# Patient Record
Sex: Female | Born: 1935
Health system: Southern US, Community
[De-identification: ages and names within clinical notes are randomized; demographics above are authoritative.]

## PROBLEM LIST (undated history)

## (undated) ENCOUNTER — Emergency Department (HOSPITAL_COMMUNITY): Admission: EM | Payer: Medicare Other | Source: Home / Self Care

## (undated) DIAGNOSIS — I5189 Other ill-defined heart diseases: Secondary | ICD-10-CM

## (undated) DIAGNOSIS — M4800 Spinal stenosis, site unspecified: Secondary | ICD-10-CM

## (undated) DIAGNOSIS — E7800 Pure hypercholesterolemia, unspecified: Secondary | ICD-10-CM

## (undated) DIAGNOSIS — N182 Chronic kidney disease, stage 2 (mild): Secondary | ICD-10-CM

## (undated) DIAGNOSIS — R001 Bradycardia, unspecified: Secondary | ICD-10-CM

## (undated) DIAGNOSIS — D329 Benign neoplasm of meninges, unspecified: Secondary | ICD-10-CM

## (undated) DIAGNOSIS — H8110 Benign paroxysmal vertigo, unspecified ear: Secondary | ICD-10-CM

## (undated) DIAGNOSIS — M1990 Unspecified osteoarthritis, unspecified site: Secondary | ICD-10-CM

## (undated) DIAGNOSIS — K5790 Diverticulosis of intestine, part unspecified, without perforation or abscess without bleeding: Secondary | ICD-10-CM

## (undated) DIAGNOSIS — I2510 Atherosclerotic heart disease of native coronary artery without angina pectoris: Secondary | ICD-10-CM

## (undated) DIAGNOSIS — I5032 Chronic diastolic (congestive) heart failure: Secondary | ICD-10-CM

## (undated) DIAGNOSIS — J309 Allergic rhinitis, unspecified: Secondary | ICD-10-CM

## (undated) DIAGNOSIS — T50905A Adverse effect of unspecified drugs, medicaments and biological substances, initial encounter: Secondary | ICD-10-CM

## (undated) DIAGNOSIS — E871 Hypo-osmolality and hyponatremia: Secondary | ICD-10-CM

## (undated) DIAGNOSIS — Z9189 Other specified personal risk factors, not elsewhere classified: Secondary | ICD-10-CM

## (undated) DIAGNOSIS — I10 Essential (primary) hypertension: Secondary | ICD-10-CM

## (undated) HISTORY — DX: Allergic rhinitis, unspecified: J30.9

## (undated) HISTORY — DX: Chronic diastolic (congestive) heart failure: I50.32

## (undated) HISTORY — PX: BACK SURGERY: SHX140

## (undated) HISTORY — DX: Benign paroxysmal vertigo, unspecified ear: H81.10

## (undated) HISTORY — DX: Hypo-osmolality and hyponatremia: E87.1

## (undated) HISTORY — DX: Unspecified osteoarthritis, unspecified site: M19.90

## (undated) HISTORY — DX: Spinal stenosis, site unspecified: M48.00

## (undated) HISTORY — DX: Benign neoplasm of meninges, unspecified: D32.9

## (undated) HISTORY — DX: Chronic kidney disease, stage 2 (mild): N18.2

## (undated) HISTORY — DX: Diverticulosis of intestine, part unspecified, without perforation or abscess without bleeding: K57.90

## (undated) HISTORY — DX: Other specified personal risk factors, not elsewhere classified: Z91.89

## (undated) HISTORY — DX: Adverse effect of unspecified drugs, medicaments and biological substances, initial encounter: T50.905A

## (undated) HISTORY — PX: BREAST BIOPSY: SHX20

## (undated) HISTORY — DX: Bradycardia, unspecified: R00.1

## (undated) HISTORY — DX: Other ill-defined heart diseases: I51.89

## (undated) HISTORY — PX: TONSILLECTOMY: SUR1361

---

## 1997-08-14 ENCOUNTER — Emergency Department (HOSPITAL_COMMUNITY): Admission: EM | Admit: 1997-08-14 | Discharge: 1997-08-14 | Payer: Self-pay | Admitting: Emergency Medicine

## 1997-11-04 ENCOUNTER — Ambulatory Visit (HOSPITAL_BASED_OUTPATIENT_CLINIC_OR_DEPARTMENT_OTHER): Admission: RE | Admit: 1997-11-04 | Discharge: 1997-11-04 | Payer: Self-pay | Admitting: Otolaryngology

## 1998-02-22 ENCOUNTER — Ambulatory Visit (HOSPITAL_COMMUNITY): Admission: RE | Admit: 1998-02-22 | Discharge: 1998-02-22 | Payer: Self-pay | Admitting: *Deleted

## 1998-02-22 ENCOUNTER — Encounter: Payer: Self-pay | Admitting: *Deleted

## 1998-03-22 ENCOUNTER — Other Ambulatory Visit: Admission: RE | Admit: 1998-03-22 | Discharge: 1998-03-22 | Payer: Self-pay | Admitting: *Deleted

## 1998-10-19 ENCOUNTER — Ambulatory Visit: Admission: RE | Admit: 1998-10-19 | Discharge: 1998-10-19 | Payer: Self-pay | Admitting: *Deleted

## 1999-03-31 ENCOUNTER — Other Ambulatory Visit: Admission: RE | Admit: 1999-03-31 | Discharge: 1999-03-31 | Payer: Self-pay | Admitting: *Deleted

## 1999-05-27 ENCOUNTER — Ambulatory Visit (HOSPITAL_COMMUNITY): Admission: RE | Admit: 1999-05-27 | Discharge: 1999-05-27 | Payer: Self-pay | Admitting: Gastroenterology

## 1999-07-25 ENCOUNTER — Encounter: Admission: RE | Admit: 1999-07-25 | Discharge: 1999-07-25 | Payer: Self-pay | Admitting: Neurosurgery

## 1999-07-25 ENCOUNTER — Encounter: Payer: Self-pay | Admitting: *Deleted

## 2000-03-27 ENCOUNTER — Other Ambulatory Visit: Admission: RE | Admit: 2000-03-27 | Discharge: 2000-03-27 | Payer: Self-pay | Admitting: Internal Medicine

## 2000-03-29 ENCOUNTER — Encounter: Admission: RE | Admit: 2000-03-29 | Discharge: 2000-03-29 | Payer: Self-pay | Admitting: Internal Medicine

## 2000-03-29 ENCOUNTER — Encounter: Payer: Self-pay | Admitting: Internal Medicine

## 2000-07-25 ENCOUNTER — Encounter: Payer: Self-pay | Admitting: Internal Medicine

## 2000-07-25 ENCOUNTER — Encounter: Admission: RE | Admit: 2000-07-25 | Discharge: 2000-07-25 | Payer: Self-pay | Admitting: Internal Medicine

## 2000-12-04 ENCOUNTER — Ambulatory Visit (HOSPITAL_COMMUNITY): Admission: RE | Admit: 2000-12-04 | Discharge: 2000-12-04 | Payer: Self-pay | Admitting: Internal Medicine

## 2001-07-24 ENCOUNTER — Ambulatory Visit (HOSPITAL_COMMUNITY): Admission: RE | Admit: 2001-07-24 | Discharge: 2001-07-24 | Payer: Self-pay | Admitting: Internal Medicine

## 2001-07-29 ENCOUNTER — Encounter: Payer: Self-pay | Admitting: Internal Medicine

## 2001-07-29 ENCOUNTER — Encounter: Admission: RE | Admit: 2001-07-29 | Discharge: 2001-07-29 | Payer: Self-pay | Admitting: Internal Medicine

## 2002-07-31 ENCOUNTER — Encounter: Payer: Self-pay | Admitting: Internal Medicine

## 2002-07-31 ENCOUNTER — Encounter: Admission: RE | Admit: 2002-07-31 | Discharge: 2002-07-31 | Payer: Self-pay | Admitting: Internal Medicine

## 2003-04-30 ENCOUNTER — Other Ambulatory Visit: Admission: RE | Admit: 2003-04-30 | Discharge: 2003-04-30 | Payer: Self-pay | Admitting: Internal Medicine

## 2003-06-05 ENCOUNTER — Ambulatory Visit (HOSPITAL_COMMUNITY): Admission: RE | Admit: 2003-06-05 | Discharge: 2003-06-05 | Payer: Self-pay | Admitting: Gastroenterology

## 2003-08-06 ENCOUNTER — Encounter: Admission: RE | Admit: 2003-08-06 | Discharge: 2003-08-06 | Payer: Self-pay | Admitting: Internal Medicine

## 2004-08-09 ENCOUNTER — Encounter: Admission: RE | Admit: 2004-08-09 | Discharge: 2004-08-09 | Payer: Self-pay | Admitting: Internal Medicine

## 2004-09-13 ENCOUNTER — Encounter: Admission: RE | Admit: 2004-09-13 | Discharge: 2004-09-13 | Payer: Self-pay | Admitting: Internal Medicine

## 2005-08-11 ENCOUNTER — Encounter: Admission: RE | Admit: 2005-08-11 | Discharge: 2005-08-11 | Payer: Self-pay | Admitting: Internal Medicine

## 2006-07-05 ENCOUNTER — Encounter: Admission: RE | Admit: 2006-07-05 | Discharge: 2006-07-05 | Payer: Self-pay | Admitting: Internal Medicine

## 2006-08-14 ENCOUNTER — Encounter: Admission: RE | Admit: 2006-08-14 | Discharge: 2006-08-14 | Payer: Self-pay | Admitting: Internal Medicine

## 2007-08-16 ENCOUNTER — Encounter: Admission: RE | Admit: 2007-08-16 | Discharge: 2007-08-16 | Payer: Self-pay | Admitting: Internal Medicine

## 2008-08-18 ENCOUNTER — Encounter: Admission: RE | Admit: 2008-08-18 | Discharge: 2008-08-18 | Payer: Self-pay | Admitting: Internal Medicine

## 2009-08-23 ENCOUNTER — Encounter: Admission: RE | Admit: 2009-08-23 | Discharge: 2009-08-23 | Payer: Self-pay | Admitting: Internal Medicine

## 2010-08-04 ENCOUNTER — Other Ambulatory Visit: Payer: Self-pay | Admitting: Internal Medicine

## 2010-08-04 DIAGNOSIS — Z1231 Encounter for screening mammogram for malignant neoplasm of breast: Secondary | ICD-10-CM

## 2010-08-25 ENCOUNTER — Ambulatory Visit
Admission: RE | Admit: 2010-08-25 | Discharge: 2010-08-25 | Disposition: A | Discharge status: Home / Self Care | Payer: Medicare Other | Source: Ambulatory Visit | Attending: Internal Medicine | Admitting: Internal Medicine

## 2010-08-25 DIAGNOSIS — Z1231 Encounter for screening mammogram for malignant neoplasm of breast: Secondary | ICD-10-CM

## 2010-09-23 ENCOUNTER — Emergency Department (HOSPITAL_COMMUNITY)
Admission: EM | Admit: 2010-09-23 | Discharge: 2010-09-23 | Disposition: A | Discharge status: Home / Self Care | Payer: Medicare Other | Attending: Emergency Medicine | Admitting: Emergency Medicine

## 2010-09-23 DIAGNOSIS — I10 Essential (primary) hypertension: Secondary | ICD-10-CM | POA: Insufficient documentation

## 2010-09-23 DIAGNOSIS — R040 Epistaxis: Secondary | ICD-10-CM | POA: Insufficient documentation

## 2010-09-23 DIAGNOSIS — Z79899 Other long term (current) drug therapy: Secondary | ICD-10-CM | POA: Insufficient documentation

## 2010-09-23 DIAGNOSIS — E119 Type 2 diabetes mellitus without complications: Secondary | ICD-10-CM | POA: Insufficient documentation

## 2011-02-23 ENCOUNTER — Encounter: Payer: Self-pay | Admitting: Emergency Medicine

## 2011-02-23 ENCOUNTER — Emergency Department (INDEPENDENT_AMBULATORY_CARE_PROVIDER_SITE_OTHER): Payer: Medicare Other

## 2011-02-23 ENCOUNTER — Emergency Department (INDEPENDENT_AMBULATORY_CARE_PROVIDER_SITE_OTHER)
Admission: EM | Admit: 2011-02-23 | Discharge: 2011-02-23 | Disposition: A | Discharge status: Home / Self Care | Payer: Medicare Other | Source: Home / Self Care | Attending: Family Medicine | Admitting: Family Medicine

## 2011-02-23 DIAGNOSIS — K5792 Diverticulitis of intestine, part unspecified, without perforation or abscess without bleeding: Secondary | ICD-10-CM

## 2011-02-23 DIAGNOSIS — K5732 Diverticulitis of large intestine without perforation or abscess without bleeding: Secondary | ICD-10-CM

## 2011-02-23 HISTORY — DX: Atherosclerotic heart disease of native coronary artery without angina pectoris: I25.10

## 2011-02-23 HISTORY — DX: Essential (primary) hypertension: I10

## 2011-02-23 HISTORY — DX: Pure hypercholesterolemia, unspecified: E78.00

## 2011-02-23 LAB — CBC
Hemoglobin: 14.4 g/dL (ref 12.0–15.0)
MCV: 92.4 fL (ref 78.0–100.0)
Platelets: 234 10*3/uL (ref 150–400)
WBC: 7.2 10*3/uL (ref 4.0–10.5)

## 2011-02-23 LAB — DIFFERENTIAL
Basophils Absolute: 0 10*3/uL (ref 0.0–0.1)
Basophils Relative: 1 % (ref 0–1)
Eosinophils Relative: 4 % (ref 0–5)
Lymphs Abs: 2.7 10*3/uL (ref 0.7–4.0)
Monocytes Absolute: 0.4 10*3/uL (ref 0.1–1.0)

## 2011-02-23 MED ORDER — CIPROFLOXACIN HCL 500 MG PO TABS: 500.0000 mg | ORAL_TABLET | Freq: Two times a day (BID) | ORAL | Status: AC

## 2011-02-23 MED ORDER — METRONIDAZOLE 500 MG PO TABS: 500.0000 mg | ORAL_TABLET | Freq: Three times a day (TID) | ORAL | Status: AC

## 2011-02-23 NOTE — ED Notes (Signed)
As above.

## 2011-02-23 NOTE — ED Provider Notes (Signed)
History     CSN: [id number] Arrival date & time: 02/23/2011  8:49 AM   First MD Initiated Contact with Patient 02/23/11 [phone]      Chief Complaint  Patient presents with  . Abdominal Pain    c/o left side abdominal pain.  patient reports feeling "gas" since thanksgiving.  reports normal bowel movement yesterday--did take oral laxative the night before.  bm seemed to help pain, but last night pain reoccured and has been constant.  no nausea or vomiting, but belching that involved foamy liquid.  deneis urinary symptoms .  reports seeing her physician for the abdominal pain last week, recived cortisone injection.      (Consider location/radiation/quality/duration/timing/severity/associated sxs/prior treatment) HPI Comments: Brenda Lynch presents for evaluation of one week of LEFT lower abdominal pain. She denies any nausea, vomiting, or PO intolerance. She reports hx of diverticulosis and polyps and most recent colonoscopy last year. She denies any fever.  Patient is a 77 y.o. female presenting with abdominal pain. The history is provided by the patient.  Abdominal Pain The primary symptoms of the illness include abdominal pain. The primary symptoms of the illness do not include fever, nausea, vomiting, diarrhea or hematochezia. The onset of the illness was sudden. The problem has not changed since onset. The patient has not had a change in bowel habit. Risk factors for an acute abdominal problem include being elderly. Symptoms associated with the illness do not include chills, anorexia or constipation. Significant associated medical issues include diverticulitis.    Past Medical History  Diagnosis Date  . Hypercholesterolemia   . Hypertension   . Diabetes mellitus   . Coronary artery disease     Past Surgical History  Procedure Date  . Back surgery   . Tonsillectomy   . Breast biopsy     No family history on file.  History  Substance Use Topics  . Smoking status: Never Smoker   .  Smokeless tobacco: Not on file  . Alcohol Use: No    OB History    Grav Para Term Preterm Abortions TAB SAB Ect Mult Living                  Review of Systems  Constitutional: Negative for fever and chills.  HENT: Negative.   Eyes: Negative.   Respiratory: Negative.   Cardiovascular: Negative.   Gastrointestinal: Positive for abdominal pain. Negative for nausea, vomiting, diarrhea, constipation, blood in stool, hematochezia, rectal pain and anorexia.  Genitourinary: Negative.   Musculoskeletal: Negative.   Skin: Negative.   Neurological: Negative.     Allergies  Penicillins  Home Medications   Current Outpatient Rx  Name Route Sig Dispense Refill  . ASPIRIN 81 MG PO TABS Oral Take 81 mg by mouth daily.      . ATORVASTATIN CALCIUM PO Oral Take by mouth.      Marland Kitchen CALCIUM CARBONATE ANTACID 500 MG PO CHEW Oral Chew 1 tablet by mouth daily.      Marland Kitchen VITAMIN D3 3000 UNITS PO TABS Oral Take by mouth.      Marland Kitchen HYDROCHLOROTHIAZIDE PO Oral Take by mouth.      . METFORMIN HCL ER (MOD) PO Oral Take by mouth.      . METOPROLOL SUCCINATE ER 25 MG PO TB24 Oral Take 25 mg by mouth daily.      Marland Kitchen OMEPRAZOLE 10 MG PO CPDR Oral Take 10 mg by mouth daily.      Marland Kitchen POTASSIUM CHLORIDE CR PO Oral Take by  mouth.      Marland Kitchen RAMIPRIL PO Oral Take by mouth.      Marland Kitchen CIPROFLOXACIN HCL 500 MG PO TABS Oral Take 1 tablet (500 mg total) by mouth every 12 (twelve) hours. 20 tablet 0  . METRONIDAZOLE 500 MG PO TABS Oral Take 1 tablet (500 mg total) by mouth 3 (three) times daily. 30 tablet 0    BP 152/81  Pulse 59  Temp(Src) 97.9 F (36.6 C) (Oral)  Resp 18  SpO2 97%  Physical Exam  Nursing note and vitals reviewed. Constitutional: She is oriented to person, place, and time. She appears well-developed and well-nourished.  HENT:  Head: Normocephalic and atraumatic.  Eyes: EOM are normal.  Neck: Normal range of motion.  Pulmonary/Chest: Effort normal.  Abdominal: Soft. She exhibits no mass. Bowel sounds are  decreased. There is tenderness in the left upper quadrant and left lower quadrant. There is no rebound and no guarding.  Musculoskeletal: Normal range of motion.  Neurological: She is alert and oriented to person, place, and time.  Skin: Skin is warm and dry.  Psychiatric: Her behavior is normal.    ED Course  Procedures (including critical care time)   Labs Reviewed  CBC  DIFFERENTIAL   Dg Abd Acute W/chest  02/23/2011  *RADIOLOGY REPORT*  Clinical Data: Left-sided pain, history of diverticulosis  ACUTE ABDOMEN SERIES (ABDOMEN 2 VIEW & CHEST 1 VIEW)  Comparison: None.  Findings: The lungs are clear.  Mediastinal contours appear normal. The heart is within normal limits in size for age.  No acute bony abnormality is seen.  Supine and erect views of the abdomen show no bowel obstruction. No free air is seen.  No opaque calculi are noted.  There are degenerative changes in the mid to lower lumbar spine.  IMPRESSION:  1.  No active lung disease. 2.  No bowel obstruction.  No free air.  Original Report Authenticated By: Juline Patch, M.D.     1. Diverticulitis       MDM  Acute abdomen series: no acute findings or free air CBC reviewed; normal WBC        Richardo Priest, MD 02/23/11 1041

## 2011-03-23 DIAGNOSIS — M546 Pain in thoracic spine: Secondary | ICD-10-CM | POA: Diagnosis not present

## 2011-03-23 DIAGNOSIS — M545 Low back pain, unspecified: Secondary | ICD-10-CM | POA: Diagnosis not present

## 2011-03-28 DIAGNOSIS — M545 Low back pain, unspecified: Secondary | ICD-10-CM | POA: Diagnosis not present

## 2011-03-28 DIAGNOSIS — M546 Pain in thoracic spine: Secondary | ICD-10-CM | POA: Diagnosis not present

## 2011-04-04 DIAGNOSIS — M545 Low back pain, unspecified: Secondary | ICD-10-CM | POA: Diagnosis not present

## 2011-04-04 DIAGNOSIS — M546 Pain in thoracic spine: Secondary | ICD-10-CM | POA: Diagnosis not present

## 2011-05-31 DIAGNOSIS — E119 Type 2 diabetes mellitus without complications: Secondary | ICD-10-CM | POA: Diagnosis not present

## 2011-05-31 DIAGNOSIS — E782 Mixed hyperlipidemia: Secondary | ICD-10-CM | POA: Diagnosis not present

## 2011-05-31 DIAGNOSIS — N9489 Other specified conditions associated with female genital organs and menstrual cycle: Secondary | ICD-10-CM | POA: Diagnosis not present

## 2011-05-31 DIAGNOSIS — I10 Essential (primary) hypertension: Secondary | ICD-10-CM | POA: Diagnosis not present

## 2011-05-31 DIAGNOSIS — I519 Heart disease, unspecified: Secondary | ICD-10-CM | POA: Diagnosis not present

## 2011-05-31 DIAGNOSIS — M949 Disorder of cartilage, unspecified: Secondary | ICD-10-CM | POA: Diagnosis not present

## 2011-05-31 DIAGNOSIS — J309 Allergic rhinitis, unspecified: Secondary | ICD-10-CM | POA: Diagnosis not present

## 2011-05-31 DIAGNOSIS — R300 Dysuria: Secondary | ICD-10-CM | POA: Diagnosis not present

## 2011-05-31 DIAGNOSIS — M899 Disorder of bone, unspecified: Secondary | ICD-10-CM | POA: Diagnosis not present

## 2011-06-14 DIAGNOSIS — L989 Disorder of the skin and subcutaneous tissue, unspecified: Secondary | ICD-10-CM | POA: Diagnosis not present

## 2011-06-14 DIAGNOSIS — K649 Unspecified hemorrhoids: Secondary | ICD-10-CM | POA: Diagnosis not present

## 2011-07-13 DIAGNOSIS — N816 Rectocele: Secondary | ICD-10-CM | POA: Diagnosis not present

## 2011-07-13 DIAGNOSIS — D071 Carcinoma in situ of vulva: Secondary | ICD-10-CM | POA: Diagnosis not present

## 2011-07-13 DIAGNOSIS — N766 Ulceration of vulva: Secondary | ICD-10-CM | POA: Diagnosis not present

## 2011-07-19 ENCOUNTER — Other Ambulatory Visit: Payer: Self-pay | Admitting: Internal Medicine

## 2011-07-19 DIAGNOSIS — D071 Carcinoma in situ of vulva: Secondary | ICD-10-CM | POA: Diagnosis not present

## 2011-07-19 DIAGNOSIS — Z1231 Encounter for screening mammogram for malignant neoplasm of breast: Secondary | ICD-10-CM

## 2011-07-19 DIAGNOSIS — R109 Unspecified abdominal pain: Secondary | ICD-10-CM | POA: Diagnosis not present

## 2011-07-24 DIAGNOSIS — D071 Carcinoma in situ of vulva: Secondary | ICD-10-CM | POA: Diagnosis not present

## 2011-08-03 DIAGNOSIS — M899 Disorder of bone, unspecified: Secondary | ICD-10-CM | POA: Diagnosis not present

## 2011-08-03 DIAGNOSIS — M949 Disorder of cartilage, unspecified: Secondary | ICD-10-CM | POA: Diagnosis not present

## 2011-08-07 DIAGNOSIS — E559 Vitamin D deficiency, unspecified: Secondary | ICD-10-CM | POA: Diagnosis not present

## 2011-08-07 DIAGNOSIS — I10 Essential (primary) hypertension: Secondary | ICD-10-CM | POA: Diagnosis not present

## 2011-08-07 DIAGNOSIS — Z7982 Long term (current) use of aspirin: Secondary | ICD-10-CM | POA: Diagnosis not present

## 2011-08-07 DIAGNOSIS — E785 Hyperlipidemia, unspecified: Secondary | ICD-10-CM | POA: Diagnosis not present

## 2011-08-07 DIAGNOSIS — M899 Disorder of bone, unspecified: Secondary | ICD-10-CM | POA: Diagnosis not present

## 2011-08-07 DIAGNOSIS — M479 Spondylosis, unspecified: Secondary | ICD-10-CM | POA: Diagnosis not present

## 2011-08-07 DIAGNOSIS — Z808 Family history of malignant neoplasm of other organs or systems: Secondary | ICD-10-CM | POA: Diagnosis not present

## 2011-08-07 DIAGNOSIS — D071 Carcinoma in situ of vulva: Secondary | ICD-10-CM | POA: Diagnosis not present

## 2011-08-07 DIAGNOSIS — K5730 Diverticulosis of large intestine without perforation or abscess without bleeding: Secondary | ICD-10-CM | POA: Diagnosis not present

## 2011-08-29 ENCOUNTER — Ambulatory Visit
Admission: RE | Admit: 2011-08-29 | Discharge: 2011-08-29 | Disposition: A | Discharge status: Home / Self Care | Payer: Medicare Other | Source: Ambulatory Visit | Attending: Internal Medicine | Admitting: Internal Medicine

## 2011-08-29 DIAGNOSIS — Z1231 Encounter for screening mammogram for malignant neoplasm of breast: Secondary | ICD-10-CM

## 2011-09-07 DIAGNOSIS — D071 Carcinoma in situ of vulva: Secondary | ICD-10-CM | POA: Diagnosis not present

## 2011-09-13 DIAGNOSIS — K219 Gastro-esophageal reflux disease without esophagitis: Secondary | ICD-10-CM | POA: Diagnosis not present

## 2011-09-13 DIAGNOSIS — Z7982 Long term (current) use of aspirin: Secondary | ICD-10-CM | POA: Diagnosis not present

## 2011-09-13 DIAGNOSIS — M1990 Unspecified osteoarthritis, unspecified site: Secondary | ICD-10-CM | POA: Diagnosis not present

## 2011-09-13 DIAGNOSIS — N901 Moderate vulvar dysplasia: Secondary | ICD-10-CM | POA: Diagnosis not present

## 2011-09-13 DIAGNOSIS — I519 Heart disease, unspecified: Secondary | ICD-10-CM | POA: Diagnosis not present

## 2011-09-13 DIAGNOSIS — D071 Carcinoma in situ of vulva: Secondary | ICD-10-CM | POA: Diagnosis not present

## 2011-09-13 DIAGNOSIS — E785 Hyperlipidemia, unspecified: Secondary | ICD-10-CM | POA: Diagnosis not present

## 2011-09-13 DIAGNOSIS — N9089 Other specified noninflammatory disorders of vulva and perineum: Secondary | ICD-10-CM | POA: Diagnosis not present

## 2011-09-13 DIAGNOSIS — E119 Type 2 diabetes mellitus without complications: Secondary | ICD-10-CM | POA: Diagnosis not present

## 2011-09-13 DIAGNOSIS — I10 Essential (primary) hypertension: Secondary | ICD-10-CM | POA: Diagnosis not present

## 2011-09-13 DIAGNOSIS — E559 Vitamin D deficiency, unspecified: Secondary | ICD-10-CM | POA: Diagnosis not present

## 2011-09-27 DIAGNOSIS — N9489 Other specified conditions associated with female genital organs and menstrual cycle: Secondary | ICD-10-CM | POA: Diagnosis not present

## 2011-10-09 DIAGNOSIS — N949 Unspecified condition associated with female genital organs and menstrual cycle: Secondary | ICD-10-CM | POA: Diagnosis not present

## 2011-10-11 DIAGNOSIS — Z0000 Encounter for general adult medical examination without abnormal findings: Secondary | ICD-10-CM | POA: Diagnosis not present

## 2011-10-11 DIAGNOSIS — E1349 Other specified diabetes mellitus with other diabetic neurological complication: Secondary | ICD-10-CM | POA: Diagnosis not present

## 2011-10-11 DIAGNOSIS — D509 Iron deficiency anemia, unspecified: Secondary | ICD-10-CM | POA: Diagnosis not present

## 2011-10-11 DIAGNOSIS — M949 Disorder of cartilage, unspecified: Secondary | ICD-10-CM | POA: Diagnosis not present

## 2011-10-11 DIAGNOSIS — Z1331 Encounter for screening for depression: Secondary | ICD-10-CM | POA: Diagnosis not present

## 2011-10-11 DIAGNOSIS — E1149 Type 2 diabetes mellitus with other diabetic neurological complication: Secondary | ICD-10-CM | POA: Diagnosis not present

## 2011-10-11 DIAGNOSIS — I10 Essential (primary) hypertension: Secondary | ICD-10-CM | POA: Diagnosis not present

## 2011-10-11 DIAGNOSIS — E782 Mixed hyperlipidemia: Secondary | ICD-10-CM | POA: Diagnosis not present

## 2011-10-11 DIAGNOSIS — M899 Disorder of bone, unspecified: Secondary | ICD-10-CM | POA: Diagnosis not present

## 2011-11-13 DIAGNOSIS — H26019 Infantile and juvenile cortical, lamellar, or zonular cataract, unspecified eye: Secondary | ICD-10-CM | POA: Diagnosis not present

## 2011-11-13 DIAGNOSIS — E119 Type 2 diabetes mellitus without complications: Secondary | ICD-10-CM | POA: Diagnosis not present

## 2011-11-13 DIAGNOSIS — H2510 Age-related nuclear cataract, unspecified eye: Secondary | ICD-10-CM | POA: Diagnosis not present

## 2011-12-04 DIAGNOSIS — H26019 Infantile and juvenile cortical, lamellar, or zonular cataract, unspecified eye: Secondary | ICD-10-CM | POA: Diagnosis not present

## 2011-12-04 DIAGNOSIS — H2510 Age-related nuclear cataract, unspecified eye: Secondary | ICD-10-CM | POA: Diagnosis not present

## 2011-12-20 DIAGNOSIS — H2510 Age-related nuclear cataract, unspecified eye: Secondary | ICD-10-CM | POA: Diagnosis not present

## 2011-12-27 DIAGNOSIS — H2510 Age-related nuclear cataract, unspecified eye: Secondary | ICD-10-CM | POA: Diagnosis not present

## 2011-12-27 DIAGNOSIS — H268 Other specified cataract: Secondary | ICD-10-CM | POA: Diagnosis not present

## 2011-12-30 DIAGNOSIS — Z23 Encounter for immunization: Secondary | ICD-10-CM | POA: Diagnosis not present

## 2012-01-24 DIAGNOSIS — D485 Neoplasm of uncertain behavior of skin: Secondary | ICD-10-CM | POA: Diagnosis not present

## 2012-01-30 DIAGNOSIS — I519 Heart disease, unspecified: Secondary | ICD-10-CM | POA: Diagnosis not present

## 2012-01-30 DIAGNOSIS — I10 Essential (primary) hypertension: Secondary | ICD-10-CM | POA: Diagnosis not present

## 2012-04-11 DIAGNOSIS — E782 Mixed hyperlipidemia: Secondary | ICD-10-CM | POA: Diagnosis not present

## 2012-04-11 DIAGNOSIS — K219 Gastro-esophageal reflux disease without esophagitis: Secondary | ICD-10-CM | POA: Diagnosis not present

## 2012-04-11 DIAGNOSIS — E1149 Type 2 diabetes mellitus with other diabetic neurological complication: Secondary | ICD-10-CM | POA: Diagnosis not present

## 2012-04-11 DIAGNOSIS — I10 Essential (primary) hypertension: Secondary | ICD-10-CM | POA: Diagnosis not present

## 2012-04-11 DIAGNOSIS — J309 Allergic rhinitis, unspecified: Secondary | ICD-10-CM | POA: Diagnosis not present

## 2012-04-11 DIAGNOSIS — E1349 Other specified diabetes mellitus with other diabetic neurological complication: Secondary | ICD-10-CM | POA: Diagnosis not present

## 2012-04-22 DIAGNOSIS — D071 Carcinoma in situ of vulva: Secondary | ICD-10-CM | POA: Diagnosis not present

## 2012-04-22 DIAGNOSIS — N952 Postmenopausal atrophic vaginitis: Secondary | ICD-10-CM | POA: Diagnosis not present

## 2012-04-22 DIAGNOSIS — Z961 Presence of intraocular lens: Secondary | ICD-10-CM | POA: Diagnosis not present

## 2012-04-22 DIAGNOSIS — H04129 Dry eye syndrome of unspecified lacrimal gland: Secondary | ICD-10-CM | POA: Diagnosis not present

## 2012-04-29 DIAGNOSIS — I519 Heart disease, unspecified: Secondary | ICD-10-CM | POA: Diagnosis not present

## 2012-04-29 DIAGNOSIS — R0602 Shortness of breath: Secondary | ICD-10-CM | POA: Diagnosis not present

## 2012-04-29 DIAGNOSIS — I10 Essential (primary) hypertension: Secondary | ICD-10-CM | POA: Diagnosis not present

## 2012-05-06 DIAGNOSIS — R51 Headache: Secondary | ICD-10-CM | POA: Diagnosis not present

## 2012-05-06 DIAGNOSIS — M2669 Other specified disorders of temporomandibular joint: Secondary | ICD-10-CM | POA: Diagnosis not present

## 2012-05-07 DIAGNOSIS — I519 Heart disease, unspecified: Secondary | ICD-10-CM | POA: Diagnosis not present

## 2012-05-07 DIAGNOSIS — R0602 Shortness of breath: Secondary | ICD-10-CM | POA: Diagnosis not present

## 2012-05-07 DIAGNOSIS — I10 Essential (primary) hypertension: Secondary | ICD-10-CM | POA: Diagnosis not present

## 2012-05-28 DIAGNOSIS — I519 Heart disease, unspecified: Secondary | ICD-10-CM | POA: Diagnosis not present

## 2012-05-28 DIAGNOSIS — I10 Essential (primary) hypertension: Secondary | ICD-10-CM | POA: Diagnosis not present

## 2012-07-25 ENCOUNTER — Other Ambulatory Visit: Payer: Self-pay

## 2012-07-25 DIAGNOSIS — Z1231 Encounter for screening mammogram for malignant neoplasm of breast: Secondary | ICD-10-CM

## 2012-08-29 ENCOUNTER — Ambulatory Visit
Admission: RE | Admit: 2012-08-29 | Discharge: 2012-08-29 | Disposition: A | Discharge status: Home / Self Care | Payer: Medicare Other | Source: Ambulatory Visit

## 2012-08-29 DIAGNOSIS — Z1231 Encounter for screening mammogram for malignant neoplasm of breast: Secondary | ICD-10-CM

## 2012-09-09 DIAGNOSIS — T7840XA Allergy, unspecified, initial encounter: Secondary | ICD-10-CM | POA: Diagnosis not present

## 2012-09-17 DIAGNOSIS — H8110 Benign paroxysmal vertigo, unspecified ear: Secondary | ICD-10-CM | POA: Diagnosis not present

## 2012-09-17 DIAGNOSIS — R0982 Postnasal drip: Secondary | ICD-10-CM | POA: Diagnosis not present

## 2012-09-17 DIAGNOSIS — J3489 Other specified disorders of nose and nasal sinuses: Secondary | ICD-10-CM | POA: Diagnosis not present

## 2012-10-10 DIAGNOSIS — K219 Gastro-esophageal reflux disease without esophagitis: Secondary | ICD-10-CM | POA: Diagnosis not present

## 2012-10-10 DIAGNOSIS — E1149 Type 2 diabetes mellitus with other diabetic neurological complication: Secondary | ICD-10-CM | POA: Diagnosis not present

## 2012-10-10 DIAGNOSIS — E1349 Other specified diabetes mellitus with other diabetic neurological complication: Secondary | ICD-10-CM | POA: Diagnosis not present

## 2012-10-10 DIAGNOSIS — Z0000 Encounter for general adult medical examination without abnormal findings: Secondary | ICD-10-CM | POA: Diagnosis not present

## 2012-10-10 DIAGNOSIS — E782 Mixed hyperlipidemia: Secondary | ICD-10-CM | POA: Diagnosis not present

## 2012-10-10 DIAGNOSIS — M25519 Pain in unspecified shoulder: Secondary | ICD-10-CM | POA: Diagnosis not present

## 2012-10-10 DIAGNOSIS — I10 Essential (primary) hypertension: Secondary | ICD-10-CM | POA: Diagnosis not present

## 2012-10-10 DIAGNOSIS — Z1331 Encounter for screening for depression: Secondary | ICD-10-CM | POA: Diagnosis not present

## 2012-10-14 DIAGNOSIS — M542 Cervicalgia: Secondary | ICD-10-CM | POA: Diagnosis not present

## 2012-10-14 DIAGNOSIS — M25819 Other specified joint disorders, unspecified shoulder: Secondary | ICD-10-CM | POA: Diagnosis not present

## 2012-11-11 DIAGNOSIS — E119 Type 2 diabetes mellitus without complications: Secondary | ICD-10-CM | POA: Diagnosis not present

## 2012-11-11 DIAGNOSIS — Z961 Presence of intraocular lens: Secondary | ICD-10-CM | POA: Diagnosis not present

## 2012-11-25 ENCOUNTER — Other Ambulatory Visit: Payer: Self-pay | Admitting: Obstetrics and Gynecology

## 2012-11-25 DIAGNOSIS — Z87412 Personal history of vulvar dysplasia: Secondary | ICD-10-CM | POA: Diagnosis not present

## 2012-12-06 DIAGNOSIS — H02839 Dermatochalasis of unspecified eye, unspecified eyelid: Secondary | ICD-10-CM | POA: Diagnosis not present

## 2012-12-12 DIAGNOSIS — Z23 Encounter for immunization: Secondary | ICD-10-CM | POA: Diagnosis not present

## 2012-12-17 DIAGNOSIS — R51 Headache: Secondary | ICD-10-CM | POA: Diagnosis not present

## 2012-12-17 DIAGNOSIS — J309 Allergic rhinitis, unspecified: Secondary | ICD-10-CM | POA: Diagnosis not present

## 2012-12-24 DIAGNOSIS — J309 Allergic rhinitis, unspecified: Secondary | ICD-10-CM | POA: Diagnosis not present

## 2012-12-24 DIAGNOSIS — H1045 Other chronic allergic conjunctivitis: Secondary | ICD-10-CM | POA: Diagnosis not present

## 2012-12-26 ENCOUNTER — Other Ambulatory Visit: Payer: Self-pay | Admitting: Obstetrics and Gynecology

## 2012-12-26 ENCOUNTER — Other Ambulatory Visit (HOSPITAL_COMMUNITY)
Admission: RE | Admit: 2012-12-26 | Discharge: 2012-12-26 | Disposition: A | Discharge status: Home / Self Care | Payer: Medicare Other | Source: Ambulatory Visit | Attending: Obstetrics and Gynecology | Admitting: Obstetrics and Gynecology

## 2012-12-26 DIAGNOSIS — Z124 Encounter for screening for malignant neoplasm of cervix: Secondary | ICD-10-CM | POA: Insufficient documentation

## 2012-12-26 DIAGNOSIS — Z87412 Personal history of vulvar dysplasia: Secondary | ICD-10-CM | POA: Diagnosis not present

## 2012-12-26 DIAGNOSIS — Z1151 Encounter for screening for human papillomavirus (HPV): Secondary | ICD-10-CM | POA: Insufficient documentation

## 2012-12-26 DIAGNOSIS — N8111 Cystocele, midline: Secondary | ICD-10-CM | POA: Diagnosis not present

## 2013-01-26 ENCOUNTER — Encounter: Payer: Self-pay | Admitting: Cardiology

## 2013-01-29 ENCOUNTER — Encounter: Payer: Self-pay | Admitting: Cardiology

## 2013-01-29 ENCOUNTER — Ambulatory Visit (INDEPENDENT_AMBULATORY_CARE_PROVIDER_SITE_OTHER): Payer: Medicare Other | Admitting: Cardiology

## 2013-01-29 VITALS — BP 120/70 | HR 65 | Ht 66.0 in | Wt 143.0 lb

## 2013-01-29 DIAGNOSIS — I509 Heart failure, unspecified: Secondary | ICD-10-CM | POA: Diagnosis not present

## 2013-01-29 DIAGNOSIS — E785 Hyperlipidemia, unspecified: Secondary | ICD-10-CM | POA: Insufficient documentation

## 2013-01-29 DIAGNOSIS — I5032 Chronic diastolic (congestive) heart failure: Secondary | ICD-10-CM | POA: Insufficient documentation

## 2013-01-29 DIAGNOSIS — E7800 Pure hypercholesterolemia, unspecified: Secondary | ICD-10-CM | POA: Insufficient documentation

## 2013-01-29 DIAGNOSIS — I5189 Other ill-defined heart diseases: Secondary | ICD-10-CM

## 2013-01-29 DIAGNOSIS — I10 Essential (primary) hypertension: Secondary | ICD-10-CM | POA: Insufficient documentation

## 2013-01-29 DIAGNOSIS — I519 Heart disease, unspecified: Secondary | ICD-10-CM | POA: Diagnosis not present

## 2013-01-29 NOTE — Patient Instructions (Signed)
Your physician recommends that you continue on your current medications as directed. Please refer to the Current Medication list given to you today.  Your physician wants you to follow-up in: 6 Months with Dr Turner You will receive a reminder letter in the mail two months in advance. If you don't receive a letter, please call our office to schedule the follow-up appointment.  

## 2013-01-29 NOTE — Progress Notes (Signed)
[address] [place], [place]  [address] Phone: [phone] Fax:  [phone]  Date:  01/29/2013   ID:  Brenda Lynch, DOB January 22, 1924, MRN [id number]  PCP:  Georgann Housekeeper, MD  Cardiologist:  Armanda Magic, MD     History of Present Illness: Brenda Lynch is a 79 y.o. female with a history of HTN, diastolic dysfunction with chronic diastolic CH,  chronic SOB secondary to chronic diastolic CHF/sedentary lifestyle and anxiety, chronically abnormal EKG with LVH and repolarization who presents today for followup.  She is doing well.  She denies any chest pain, SOB, DOE, LE edema, dizziness, palpitations or syncope.  Recently she has had some URI symptoms and has been on prednisone due to allergies and she is doing much better.     Wt Readings from Last 3 Encounters:  01/29/13 143 lb (64.864 kg)     Past Medical History  Diagnosis Date  . Coronary artery disease   . Hypertension   . Diastolic dysfunction   . Hypercholesterolemia   . Spinal stenosis   . Meningioma   . Vertigo, benign positional   . Osteoarthritis   . Allergic rhinitis   . Diverticulosis   . Diabetes mellitus     Current Outpatient Prescriptions  Medication Sig Dispense Refill  . ANUCORT-HC 25 MG suppository 2 (two) times daily as needed.       Marland Kitchen aspirin 81 MG tablet Take 81 mg by mouth daily.        . ATORVASTATIN CALCIUM PO Take 20 mg by mouth daily.       . calcium carbonate (TUMS - DOSED IN MG ELEMENTAL CALCIUM) 500 MG chewable tablet Chew 1 tablet by mouth daily.        . calcium citrate-vitamin D (CITRACAL+D) 315-200 MG-UNIT per tablet Take 1 tablet by mouth 2 (two) times daily.      . ferrous fumarate (HEMOCYTE - 106 MG FE) 325 (106 FE) MG TABS tablet Take 1 tablet by mouth.      Marland Kitchen HYDROCHLOROTHIAZIDE PO Take 25 mg by mouth daily.       . Lancets (ONETOUCH ULTRASOFT) lancets       . METFORMIN HCL ER, MOD, PO Take by mouth.        . metoprolol succinate (TOPROL-XL) 25 MG 24 hr tablet Take 25  mg by mouth daily.       . Misc Natural Products (OSTEO BI-FLEX ADV DOUBLE ST PO) Take by mouth.      . montelukast (SINGULAIR) 10 MG tablet as needed.      Marland Kitchen omeprazole (PRILOSEC) 10 MG capsule Take 10 mg by mouth daily.        . ONE TOUCH ULTRA TEST test strip       . POTASSIUM CHLORIDE CR PO Take 10 mEq by mouth daily.       Marland Kitchen RAMIPRIL PO Take 5 mg by mouth daily.        No current facility-administered medications for this visit.    Allergies:    Allergies  Allergen Reactions  . Ciprofloxacin     GI intolerance  . Penicillins     Social History:  The patient  reports that she quit smoking about 34 years ago. She does not have any smokeless tobacco history on file. She reports that she does not drink alcohol or use illicit drugs.   Family History:  The patient's family history includes Heart attack in her mother; Heart disease in her mother;  Lung cancer in her brother; Pancreatic cancer in her brother; Prostate cancer in her father.   ROS:  Please see the history of present illness.      All other systems reviewed and negative.   PHYSICAL EXAM: VS:  BP 120/70  Pulse 65  Ht 5\' 6"  (1.676 m)  Wt 143 lb (64.864 kg)  BMI 23.09 kg/m2 Well nourished, well developed, in no acute distress HEENT: normal Neck: no JVD Cardiac:  normal S1, S2; RRR; no murmur Lungs:  clear to auscultation bilaterally, no wheezing, rhonchi or rales Abd: soft, nontender, no hepatomegaly Ext: no edema Skin: warm and dry Neuro:  CNs 2-12 intact, no focal abnormalities noted  EKG:  NSR with ST/T wave abnormality inferolaterally - no change from prior EKG      ASSESSMENT AND PLAN:  1. Diastolic dysfunction with chronic diastolic CHF  - continue ramipril/HCTZ and metoprolol 2. Chronic SOB secondary to sedentary lifestyle and #1 3. HTN - well controlled  - continue HCTZ/metoprolol/ramipril  Followup with me in 6 months  Signed, Armanda Magic, MD 01/29/2013 10:08 AM

## 2013-02-05 ENCOUNTER — Telehealth: Payer: Self-pay | Admitting: Cardiology

## 2013-02-05 NOTE — Telephone Encounter (Signed)
New message     Saw Dr Mayford Knife in [employer] that appt she is having more shortness of breath, dizziness and headache.  Not every day---she thinks it may be allergies.   She want Dr Mayford Knife to know this.

## 2013-02-05 NOTE — Telephone Encounter (Signed)
To Dr Turner to make aware.  

## 2013-02-06 NOTE — Telephone Encounter (Signed)
Pt is aware.  

## 2013-02-06 NOTE — Telephone Encounter (Signed)
Please have her see her PCP first since she thinks it is allergies and if PCP does not think it is allergy related then refer back to me for OV

## 2013-04-15 DIAGNOSIS — E1149 Type 2 diabetes mellitus with other diabetic neurological complication: Secondary | ICD-10-CM | POA: Diagnosis not present

## 2013-04-15 DIAGNOSIS — E782 Mixed hyperlipidemia: Secondary | ICD-10-CM | POA: Diagnosis not present

## 2013-04-15 DIAGNOSIS — N182 Chronic kidney disease, stage 2 (mild): Secondary | ICD-10-CM | POA: Diagnosis not present

## 2013-04-15 DIAGNOSIS — K219 Gastro-esophageal reflux disease without esophagitis: Secondary | ICD-10-CM | POA: Diagnosis not present

## 2013-04-15 DIAGNOSIS — E1349 Other specified diabetes mellitus with other diabetic neurological complication: Secondary | ICD-10-CM | POA: Diagnosis not present

## 2013-04-15 DIAGNOSIS — I10 Essential (primary) hypertension: Secondary | ICD-10-CM | POA: Diagnosis not present

## 2013-06-23 ENCOUNTER — Other Ambulatory Visit: Payer: Self-pay | Admitting: Cardiology

## 2013-07-01 DIAGNOSIS — M899 Disorder of bone, unspecified: Secondary | ICD-10-CM | POA: Diagnosis not present

## 2013-07-01 DIAGNOSIS — Z01419 Encounter for gynecological examination (general) (routine) without abnormal findings: Secondary | ICD-10-CM | POA: Diagnosis not present

## 2013-07-01 DIAGNOSIS — D071 Carcinoma in situ of vulva: Secondary | ICD-10-CM | POA: Diagnosis not present

## 2013-07-29 ENCOUNTER — Ambulatory Visit (INDEPENDENT_AMBULATORY_CARE_PROVIDER_SITE_OTHER): Payer: Medicare Other | Admitting: Cardiology

## 2013-07-29 ENCOUNTER — Encounter: Payer: Self-pay | Admitting: Cardiology

## 2013-07-29 VITALS — BP 142/72 | HR 70 | Ht 66.0 in | Wt 142.8 lb

## 2013-07-29 DIAGNOSIS — I5032 Chronic diastolic (congestive) heart failure: Secondary | ICD-10-CM

## 2013-07-29 DIAGNOSIS — I10 Essential (primary) hypertension: Secondary | ICD-10-CM

## 2013-07-29 DIAGNOSIS — R0602 Shortness of breath: Secondary | ICD-10-CM | POA: Diagnosis not present

## 2013-07-29 DIAGNOSIS — I509 Heart failure, unspecified: Secondary | ICD-10-CM | POA: Diagnosis not present

## 2013-07-29 LAB — BASIC METABOLIC PANEL
BUN: 18 mg/dL (ref 6–23)
CALCIUM: 9.6 mg/dL (ref 8.4–10.5)
CHLORIDE: 101 meq/L (ref 96–112)
CO2: 31 mEq/L (ref 19–32)
CREATININE: 1.2 mg/dL (ref 0.4–1.2)
GFR: 54.05 mL/min — AB (ref >60.00)
Glucose, Bld: 94 mg/dL (ref 70–99)
Potassium: 3.8 mEq/L (ref 3.5–5.1)
Sodium: 138 mEq/L (ref 135–145)

## 2013-07-29 NOTE — Patient Instructions (Signed)
Your physician recommends that you continue on your current medications as directed. Please refer to the Current Medication list given to you today.  Your physician recommends that you go to the lab today for a BMET  Your physician wants you to follow-up in: 6 months with Dr Turner You will receive a reminder letter in the mail two months in advance. If you don't receive a letter, please call our office to schedule the follow-up appointment.  

## 2013-07-29 NOTE — Progress Notes (Signed)
[place], [place], [place]  [address] Phone: [phone] Fax:  [phone]  Date:  07/29/2013   ID:  ALIZIA GREIF, DOB [birth year], MRN [id number]  PCP:  [name], MD  Cardiologist:  Fransico Him, MD     History of Present Illness: Brenda Lynch is a 80 y.o. female with a history of HTN, diastolic dysfunction with chronic diastolic CHF, chronic SOB secondary to chronic diastolic CHF/sedentary lifestyle and anxiety, chronically abnormal EKG with LVH and repolarization who presents today for followup. She is doing well. She denies any chest pain, LE edema, dizziness, palpitations or syncope.  She has chronic DOE which is stable.  She stays busy working in the yard.    Wt Readings from Last 3 Encounters:  07/29/13 142 lb 12.8 oz (64.774 kg)  01/29/13 143 lb (64.864 kg)     Past Medical History  Diagnosis Date  . Coronary artery disease   . Hypertension   . Diastolic dysfunction   . Hypercholesterolemia   . Spinal stenosis   . Meningioma   . Vertigo, benign positional   . Osteoarthritis   . Allergic rhinitis   . Diverticulosis   . Diabetes mellitus   . Chronic diastolic CHF (congestive heart failure)     Current Outpatient Prescriptions  Medication Sig Dispense Refill  . ANUCORT-HC 25 MG suppository 2 (two) times daily as needed.       Marland Kitchen aspirin 81 MG tablet Take 81 mg by mouth daily.        . ATORVASTATIN CALCIUM PO Take 20 mg by mouth daily.       . calcium carbonate (TUMS - DOSED IN MG ELEMENTAL CALCIUM) 500 MG chewable tablet Chew 1 tablet by mouth daily.        . calcium citrate-vitamin D (CITRACAL+D) 315-200 MG-UNIT per tablet Take 1 tablet by mouth 2 (two) times daily.      . ferrous fumarate (HEMOCYTE - 106 MG FE) 325 (106 FE) MG TABS tablet Take 1 tablet by mouth.      Marland Kitchen HYDROCHLOROTHIAZIDE PO Take 25 mg by mouth daily.       . Lancets (ONETOUCH ULTRASOFT) lancets       . METFORMIN HCL ER, MOD, PO Take by mouth.        . metoprolol  succinate (TOPROL-XL) 25 MG 24 hr tablet Take 25 mg by mouth daily.       . Misc Natural Products (OSTEO BI-FLEX ADV DOUBLE ST PO) Take by mouth.      . montelukast (SINGULAIR) 10 MG tablet Take by mouth as needed.       Marland Kitchen omeprazole (PRILOSEC) 10 MG capsule Take 10 mg by mouth daily.        . ONE TOUCH ULTRA TEST test strip       . POTASSIUM CHLORIDE CR PO Take 10 mEq by mouth daily.       . ramipril (ALTACE) 5 MG capsule TAKE (1) CAPSULE DAILY.  30 capsule  0   No current facility-administered medications for this visit.    Allergies:    Allergies  Allergen Reactions  . Ciprofloxacin     GI intolerance  . Penicillins     Social History:  The patient  reports that she quit smoking about 34 years ago. She does not have any smokeless tobacco history on file. She reports that she does not drink alcohol or use illicit drugs.   Family History:  The patient's family history includes  Heart attack in her mother; Heart disease in her mother; Lung cancer in her brother; Pancreatic cancer in her brother; Prostate cancer in her father.   ROS:  Please see the history of present illness.      All other systems reviewed and negative.   PHYSICAL EXAM: VS:  BP 142/72  Pulse 70  Ht 5\' 6"  (1.676 m)  Wt 142 lb 12.8 oz (64.774 kg)  BMI 23.06 kg/m2 Well nourished, well developed, in no acute distress HEENT: normal Neck: no JVD Cardiac:  normal S1, S2; RRR; no murmur Lungs:  clear to auscultation bilaterally, no wheezing, rhonchi or rales Abd: soft, nontender, no hepatomegaly Ext: no edema Skin: warm and dry Neuro:  CNs 2-12 intact, no focal abnormalities noted  ASSESSMENT AND PLAN:  1.  Diastolic dysfunction with chronic diastolic CHF - continue ramipril/HCTZ and metoprolol  - check BMET 2.  Chronic SOB secondary to sedentary lifestyle and #1 3.  HTN - well controlled - continue HCTZ/metoprolol/ramipril   Followup with me in 6 months   Signed, Fransico Him, MD 07/29/2013 9:59 AM

## 2013-07-30 ENCOUNTER — Encounter: Payer: Self-pay | Admitting: Cardiology

## 2013-07-30 NOTE — Telephone Encounter (Signed)
New problem   Pt returning a call concerning her labs

## 2013-07-30 NOTE — Telephone Encounter (Signed)
This encounter was created in error - please disregard.

## 2013-08-04 ENCOUNTER — Other Ambulatory Visit: Payer: Self-pay

## 2013-08-04 DIAGNOSIS — Z1231 Encounter for screening mammogram for malignant neoplasm of breast: Secondary | ICD-10-CM

## 2013-09-02 ENCOUNTER — Encounter (INDEPENDENT_AMBULATORY_CARE_PROVIDER_SITE_OTHER): Payer: Self-pay

## 2013-09-02 ENCOUNTER — Ambulatory Visit
Admission: RE | Admit: 2013-09-02 | Discharge: 2013-09-02 | Disposition: A | Discharge status: Home / Self Care | Payer: Medicare Other | Source: Ambulatory Visit

## 2013-09-02 DIAGNOSIS — Z1231 Encounter for screening mammogram for malignant neoplasm of breast: Secondary | ICD-10-CM

## 2013-09-09 DIAGNOSIS — M899 Disorder of bone, unspecified: Secondary | ICD-10-CM | POA: Diagnosis not present

## 2013-09-09 DIAGNOSIS — M949 Disorder of cartilage, unspecified: Secondary | ICD-10-CM | POA: Diagnosis not present

## 2013-10-27 ENCOUNTER — Other Ambulatory Visit: Payer: Self-pay | Admitting: Cardiology

## 2013-10-27 DIAGNOSIS — N182 Chronic kidney disease, stage 2 (mild): Secondary | ICD-10-CM | POA: Diagnosis not present

## 2013-10-27 DIAGNOSIS — K219 Gastro-esophageal reflux disease without esophagitis: Secondary | ICD-10-CM | POA: Diagnosis not present

## 2013-10-27 DIAGNOSIS — Z1331 Encounter for screening for depression: Secondary | ICD-10-CM | POA: Diagnosis not present

## 2013-10-27 DIAGNOSIS — E782 Mixed hyperlipidemia: Secondary | ICD-10-CM | POA: Diagnosis not present

## 2013-10-27 DIAGNOSIS — Z23 Encounter for immunization: Secondary | ICD-10-CM | POA: Diagnosis not present

## 2013-10-27 DIAGNOSIS — Z0000 Encounter for general adult medical examination without abnormal findings: Secondary | ICD-10-CM | POA: Diagnosis not present

## 2013-10-27 DIAGNOSIS — M899 Disorder of bone, unspecified: Secondary | ICD-10-CM | POA: Diagnosis not present

## 2013-10-27 DIAGNOSIS — M949 Disorder of cartilage, unspecified: Secondary | ICD-10-CM | POA: Diagnosis not present

## 2013-10-27 DIAGNOSIS — I10 Essential (primary) hypertension: Secondary | ICD-10-CM | POA: Diagnosis not present

## 2013-10-27 DIAGNOSIS — E1349 Other specified diabetes mellitus with other diabetic neurological complication: Secondary | ICD-10-CM | POA: Diagnosis not present

## 2013-10-27 DIAGNOSIS — E1149 Type 2 diabetes mellitus with other diabetic neurological complication: Secondary | ICD-10-CM | POA: Diagnosis not present

## 2013-10-27 DIAGNOSIS — D509 Iron deficiency anemia, unspecified: Secondary | ICD-10-CM | POA: Diagnosis not present

## 2013-11-17 DIAGNOSIS — T148 Other injury of unspecified body region: Secondary | ICD-10-CM | POA: Diagnosis not present

## 2013-11-17 DIAGNOSIS — E119 Type 2 diabetes mellitus without complications: Secondary | ICD-10-CM | POA: Diagnosis not present

## 2013-11-17 DIAGNOSIS — Z961 Presence of intraocular lens: Secondary | ICD-10-CM | POA: Diagnosis not present

## 2013-12-01 DIAGNOSIS — J069 Acute upper respiratory infection, unspecified: Secondary | ICD-10-CM | POA: Diagnosis not present

## 2013-12-09 DIAGNOSIS — J309 Allergic rhinitis, unspecified: Secondary | ICD-10-CM | POA: Diagnosis not present

## 2013-12-09 DIAGNOSIS — H6980 Other specified disorders of Eustachian tube, unspecified ear: Secondary | ICD-10-CM | POA: Diagnosis not present

## 2013-12-16 ENCOUNTER — Other Ambulatory Visit: Payer: Self-pay | Admitting: Nurse Practitioner

## 2013-12-16 DIAGNOSIS — J019 Acute sinusitis, unspecified: Secondary | ICD-10-CM | POA: Diagnosis not present

## 2013-12-16 DIAGNOSIS — R42 Dizziness and giddiness: Secondary | ICD-10-CM | POA: Diagnosis not present

## 2013-12-17 ENCOUNTER — Ambulatory Visit
Admission: RE | Admit: 2013-12-17 | Discharge: 2013-12-17 | Disposition: A | Discharge status: Home / Self Care | Payer: Medicare Other | Source: Ambulatory Visit | Attending: Nurse Practitioner | Admitting: Nurse Practitioner

## 2013-12-17 ENCOUNTER — Other Ambulatory Visit: Payer: Self-pay | Admitting: Nurse Practitioner

## 2013-12-17 DIAGNOSIS — R42 Dizziness and giddiness: Secondary | ICD-10-CM | POA: Diagnosis not present

## 2013-12-17 DIAGNOSIS — J3489 Other specified disorders of nose and nasal sinuses: Secondary | ICD-10-CM | POA: Diagnosis not present

## 2013-12-17 DIAGNOSIS — Z8709 Personal history of other diseases of the respiratory system: Secondary | ICD-10-CM

## 2013-12-17 DIAGNOSIS — D320 Benign neoplasm of cerebral meninges: Secondary | ICD-10-CM | POA: Diagnosis not present

## 2013-12-22 DIAGNOSIS — J309 Allergic rhinitis, unspecified: Secondary | ICD-10-CM | POA: Diagnosis not present

## 2013-12-22 DIAGNOSIS — H1045 Other chronic allergic conjunctivitis: Secondary | ICD-10-CM | POA: Diagnosis not present

## 2013-12-22 DIAGNOSIS — J019 Acute sinusitis, unspecified: Secondary | ICD-10-CM | POA: Diagnosis not present

## 2014-01-22 ENCOUNTER — Other Ambulatory Visit: Payer: Self-pay | Admitting: Cardiology

## 2014-01-22 DIAGNOSIS — I10 Essential (primary) hypertension: Secondary | ICD-10-CM | POA: Diagnosis not present

## 2014-01-22 DIAGNOSIS — D329 Benign neoplasm of meninges, unspecified: Secondary | ICD-10-CM | POA: Diagnosis not present

## 2014-01-27 DIAGNOSIS — D329 Benign neoplasm of meninges, unspecified: Secondary | ICD-10-CM | POA: Diagnosis not present

## 2014-01-27 DIAGNOSIS — R030 Elevated blood-pressure reading, without diagnosis of hypertension: Secondary | ICD-10-CM | POA: Diagnosis not present

## 2014-01-28 DIAGNOSIS — J328 Other chronic sinusitis: Secondary | ICD-10-CM | POA: Diagnosis not present

## 2014-01-28 DIAGNOSIS — R42 Dizziness and giddiness: Secondary | ICD-10-CM | POA: Diagnosis not present

## 2014-01-28 DIAGNOSIS — J310 Chronic rhinitis: Secondary | ICD-10-CM | POA: Diagnosis not present

## 2014-01-29 ENCOUNTER — Encounter: Payer: Self-pay | Admitting: Cardiology

## 2014-01-29 ENCOUNTER — Ambulatory Visit (INDEPENDENT_AMBULATORY_CARE_PROVIDER_SITE_OTHER): Payer: Medicare Other | Admitting: Cardiology

## 2014-01-29 VITALS — BP 112/70 | HR 65 | Ht 66.0 in | Wt 138.0 lb

## 2014-01-29 DIAGNOSIS — R0602 Shortness of breath: Secondary | ICD-10-CM | POA: Diagnosis not present

## 2014-01-29 DIAGNOSIS — I10 Essential (primary) hypertension: Secondary | ICD-10-CM | POA: Diagnosis not present

## 2014-01-29 DIAGNOSIS — I5032 Chronic diastolic (congestive) heart failure: Secondary | ICD-10-CM

## 2014-01-29 LAB — BASIC METABOLIC PANEL
BUN: 18 mg/dL (ref 6–23)
CHLORIDE: 99 meq/L (ref 96–112)
CO2: 31 mEq/L (ref 19–32)
Calcium: 9.3 mg/dL (ref 8.4–10.5)
Creatinine, Ser: 1.1 mg/dL (ref 0.4–1.2)
GFR: 62.22 mL/min (ref >60.00)
Glucose, Bld: 100 mg/dL — ABNORMAL HIGH (ref 70–99)
Potassium: 4.4 mEq/L (ref 3.5–5.1)
Sodium: 136 mEq/L (ref 135–145)

## 2014-01-29 NOTE — Progress Notes (Signed)
[place], [place], [place]  [address] Phone: [phone] Fax:  [phone]  Date:  01/29/2014   ID:  Brenda Lynch, DOB [birth year], MRN [id number]  PCP:  [name], MD  Cardiologist:  Fransico Him, MD    History of Present Illness: Brenda Lynch is a 80 y.o. female with a history of HTN, diastolic dysfunction with chronic diastolic CHF, chronic SOB secondary to chronic diastolic CHF/sedentary lifestyle and anxiety, chronically abnormal EKG with LVH and repolarization who presents today for followup. She is doing well. She denies any chest pain, LE edema, dizziness, palpitations or syncope. She has chronic DOE which is stable. She stays busy working in the yard.    Wt Readings from Last 3 Encounters:  01/29/14 138 lb (62.596 kg)  07/29/13 142 lb 12.8 oz (64.774 kg)  01/29/13 143 lb (64.864 kg)     Past Medical History  Diagnosis Date  . Coronary artery disease   . Hypertension   . Diastolic dysfunction   . Hypercholesterolemia   . Spinal stenosis   . Meningioma   . Vertigo, benign positional   . Osteoarthritis   . Allergic rhinitis   . Diverticulosis   . Diabetes mellitus   . Chronic diastolic CHF (congestive heart failure)     Current Outpatient Prescriptions  Medication Sig Dispense Refill  . ANUCORT-HC 25 MG suppository 2 (two) times daily as needed.     Marland Kitchen aspirin 81 MG tablet Take 81 mg by mouth daily.      . ATORVASTATIN CALCIUM PO Take 20 mg by mouth daily.     . calcium carbonate (TUMS - DOSED IN MG ELEMENTAL CALCIUM) 500 MG chewable tablet Chew 1 tablet by mouth daily.      . calcium citrate-vitamin D (CITRACAL+D) 315-200 MG-UNIT per tablet Take 1 tablet by mouth 2 (two) times daily.    . ferrous fumarate (HEMOCYTE - 106 MG FE) 325 (106 FE) MG TABS tablet Take 1 tablet by mouth.    Marland Kitchen HYDROCHLOROTHIAZIDE PO Take 25 mg by mouth daily.     Marland Kitchen METFORMIN HCL ER, MOD, PO Take by mouth.      . metoprolol succinate (TOPROL-XL) 25 MG 24 hr tablet  Take 25 mg by mouth daily.     . Misc Natural Products (OSTEO BI-FLEX ADV DOUBLE ST PO) Take by mouth.    . montelukast (SINGULAIR) 10 MG tablet Take by mouth as needed.     Marland Kitchen omeprazole (PRILOSEC) 10 MG capsule Take 10 mg by mouth daily.      Marland Kitchen POTASSIUM CHLORIDE CR PO Take 10 mEq by mouth daily.     . ramipril (ALTACE) 5 MG capsule TAKE (1) CAPSULE DAILY. 30 capsule 3  . Lancets (ONETOUCH ULTRASOFT) lancets     . ONE TOUCH ULTRA TEST test strip      No current facility-administered medications for this visit.    Allergies:    Allergies  Allergen Reactions  . Ciprofloxacin     GI intolerance  . Penicillins     Social History:  The patient  reports that she quit smoking about 35 years ago. She does not have any smokeless tobacco history on file. She reports that she does not drink alcohol or use illicit drugs.   Family History:  The patient's family history includes Heart attack in her mother; Heart disease in her mother; Lung cancer in her brother; Pancreatic cancer in her brother; Prostate cancer in her father.   ROS:  Please see the history of present illness.      All other systems reviewed and negative.   PHYSICAL EXAM: VS:  BP 112/70 mmHg  Pulse 65  Ht 5\' 6"  (1.676 m)  Wt 138 lb (62.596 kg)  BMI 22.28 kg/m2 Well nourished, well developed, in no acute distress HEENT: normal Neck: no JVD Cardiac:  normal S1, S2; RRR; no murmur Lungs:  clear to auscultation bilaterally, no wheezing, rhonchi or rales Abd: soft, nontender, no hepatomegaly Ext: no edema Skin: warm and dry Neuro:  CNs 2-12 intact, no focal abnormalities noted  EKG:  NSR at 65bpm with LVH with repol abnormality     ASSESSMENT AND PLAN:  1. Diastolic dysfunction with chronic diastolic CHF - continue ramipril/HCTZ and metoprolol  - check BMET 2. Chronic SOB secondary to sedentary lifestyle and #1 3. HTN - well controlled - continue HCTZ/metoprolol/ramipril   Followup with me in 6  month   Signed, Fransico Him, MD [facility] 01/29/2014 10:25 AM

## 2014-01-29 NOTE — Patient Instructions (Addendum)
Your physician recommends that you have lab work TODAY (BMET).  Your physician wants you to follow-up in: 6 months with Dr. Turner. You will receive a reminder letter in the mail two months in advance. If you don't receive a letter, please call our office to schedule the follow-up appointment.  

## 2014-02-19 DIAGNOSIS — D649 Anemia, unspecified: Secondary | ICD-10-CM | POA: Diagnosis not present

## 2014-02-19 DIAGNOSIS — J309 Allergic rhinitis, unspecified: Secondary | ICD-10-CM | POA: Diagnosis not present

## 2014-02-19 DIAGNOSIS — I10 Essential (primary) hypertension: Secondary | ICD-10-CM | POA: Diagnosis not present

## 2014-02-19 DIAGNOSIS — E1165 Type 2 diabetes mellitus with hyperglycemia: Secondary | ICD-10-CM | POA: Diagnosis not present

## 2014-03-02 DIAGNOSIS — J310 Chronic rhinitis: Secondary | ICD-10-CM | POA: Diagnosis not present

## 2014-04-24 DIAGNOSIS — R300 Dysuria: Secondary | ICD-10-CM | POA: Diagnosis not present

## 2014-05-12 DIAGNOSIS — J329 Chronic sinusitis, unspecified: Secondary | ICD-10-CM | POA: Diagnosis not present

## 2014-06-05 ENCOUNTER — Other Ambulatory Visit: Payer: Self-pay | Admitting: Cardiology

## 2014-06-16 DIAGNOSIS — R300 Dysuria: Secondary | ICD-10-CM | POA: Diagnosis not present

## 2014-06-26 DIAGNOSIS — N898 Other specified noninflammatory disorders of vagina: Secondary | ICD-10-CM | POA: Diagnosis not present

## 2014-06-30 DIAGNOSIS — H903 Sensorineural hearing loss, bilateral: Secondary | ICD-10-CM | POA: Diagnosis not present

## 2014-06-30 DIAGNOSIS — H9312 Tinnitus, left ear: Secondary | ICD-10-CM | POA: Diagnosis not present

## 2014-06-30 DIAGNOSIS — R42 Dizziness and giddiness: Secondary | ICD-10-CM | POA: Diagnosis not present

## 2014-07-24 DIAGNOSIS — Z01419 Encounter for gynecological examination (general) (routine) without abnormal findings: Secondary | ICD-10-CM | POA: Diagnosis not present

## 2014-07-24 DIAGNOSIS — Z9289 Personal history of other medical treatment: Secondary | ICD-10-CM | POA: Diagnosis not present

## 2014-07-24 DIAGNOSIS — D071 Carcinoma in situ of vulva: Secondary | ICD-10-CM | POA: Diagnosis not present

## 2014-07-24 DIAGNOSIS — N951 Menopausal and female climacteric states: Secondary | ICD-10-CM | POA: Diagnosis not present

## 2014-07-27 NOTE — Progress Notes (Signed)
Cardiology Office Note   Date:  07/28/2014   ID:  Brenda Lynch, DOB [birth year], MRN [id number]  PCP:  [name], MD    Chief Complaint  Patient presents with  . Congestive Heart Failure  . Follow-up    hypertension      History of Present Illness: Brenda Lynch is a 81 y.o. female with a history of HTN, diastolic dysfunction with chronic diastolic CHF, chronic SOB secondary to chronic diastolic CHF/sedentary lifestyle and anxiety, chronically abnormal EKG with LVH and repolarization who presents today for followup. She is doing well. She denies any chest pain, SOB, LE edema, dizziness, palpitations or syncope.She stays busy working in the yard.      Past Medical History  Diagnosis Date  . Coronary artery disease   . Hypertension   . Diastolic dysfunction   . Hypercholesterolemia   . Spinal stenosis   . Meningioma   . Vertigo, benign positional   . Osteoarthritis   . Allergic rhinitis   . Diverticulosis   . Diabetes mellitus   . Chronic diastolic CHF (congestive heart failure)     Past Surgical History  Procedure Laterality Date  . Back surgery    . Tonsillectomy    . Breast biopsy       Current Outpatient Prescriptions  Medication Sig Dispense Refill  . ANUCORT-HC 25 MG suppository 2 (two) times daily as needed.     Marland Kitchen aspirin 81 MG tablet Take 81 mg by mouth daily.      Marland Kitchen atorvastatin (LIPITOR) 20 MG tablet Take 20 mg by mouth daily.    . calcium carbonate (TUMS - DOSED IN MG ELEMENTAL CALCIUM) 500 MG chewable tablet Chew 1 tablet by mouth daily.      . calcium citrate-vitamin D (CITRACAL+D) 315-200 MG-UNIT per tablet Take 1 tablet by mouth 2 (two) times daily.    . ferrous fumarate (HEMOCYTE - 106 MG FE) 325 (106 FE) MG TABS tablet Take 1 tablet by mouth.    . Lancets (ONETOUCH ULTRASOFT) lancets     . metFORMIN (GLUCOPHAGE) 500 MG tablet Take 500 mg by mouth daily.    . metoprolol succinate (TOPROL-XL) 50 MG 24 hr tablet Take 50 mg by mouth  daily.    . Misc Natural Products (OSTEO BI-FLEX ADV DOUBLE ST PO) Take by mouth.    . montelukast (SINGULAIR) 10 MG tablet Take by mouth as needed.     Marland Kitchen omeprazole (PRILOSEC) 10 MG capsule Take 10 mg by mouth daily.      . ONE TOUCH ULTRA TEST test strip     . potassium chloride (MICRO-K) 10 MEQ CR capsule Take 10 mEq by mouth daily.    Marland Kitchen POTASSIUM CHLORIDE CR PO Take 10 mEq by mouth daily.     . ramipril (ALTACE) 5 MG capsule TAKE (1) CAPSULE DAILY. 30 capsule 1   No current facility-administered medications for this visit.    Allergies:   Ciprofloxacin and Penicillins    Social History:  The patient  reports that she quit smoking about 35 years ago. She does not have any smokeless tobacco history on file. She reports that she does not drink alcohol or use illicit drugs.   Family History:  The patient's family history includes Heart attack in her mother; Heart disease in her mother; Lung cancer in her brother; Pancreatic cancer in her brother; Prostate cancer in her father.    ROS:  Please see the history of present illness.   Otherwise,  review of systems are positive for none.   All other systems are reviewed and negative.    PHYSICAL EXAM: VS:  BP 110/60 mmHg  Pulse 54  Ht 5\' 6"  (1.676 m)  Wt 127 lb (57.607 kg)  BMI 20.51 kg/m2  SpO2 94% , BMI Body mass index is 20.51 kg/(m^2). GEN: Well nourished, well developed, in no acute distress HEENT: normal Neck: no JVD, carotid bruits, or masses Cardiac: RRR; no murmurs, rubs, or gallops,no edema  Respiratory:  clear to auscultation bilaterally, normal work of breathing GI: soft, nontender, nondistended, + BS MS: no deformity or atrophy Skin: warm and dry, no rash Neuro:  Strength and sensation are intact Psych: euthymic mood, full affect   EKG:  EKG is not ordered today.    Recent Labs: 01/29/2014: BUN 18; Creatinine 1.1; Potassium 4.4; Sodium 136    Lipid Panel No results found for: CHOL, TRIG, HDL, CHOLHDL, VLDL,  LDLCALC, LDLDIRECT    Wt Readings from Last 3 Encounters:  07/28/14 127 lb (57.607 kg)  01/29/14 138 lb (62.596 kg)  07/29/13 142 lb 12.8 oz (64.774 kg)    ASSESSMENT AND PLAN:  1. Diastolic dysfunction with chronic diastolic CHF - she appears euvolemic on exam - continue ramipril/HCTZ and metoprolol  - check BMET 2. Chronic SOB secondary to sedentary lifestyle and #1 - this has resolved 3. HTN - well controlled - continue HCTZ/metoprolol/ramipril     Current medicines are reviewed at length with the patient today.  The patient does not have concerns regarding medicines.  The following changes have been made:  no change  Labs/ tests ordered today include: see above assessment and plan No orders of the defined types were placed in this encounter.     Disposition:   FU with me in 6 months   Signed, Sueanne Margarita, MD  07/28/2014 10:56 AM    [facility] [place], [place], [place]  [address] Phone: [phone]; Fax: [phone]

## 2014-07-28 ENCOUNTER — Encounter: Payer: Self-pay | Admitting: Cardiology

## 2014-07-28 ENCOUNTER — Ambulatory Visit (INDEPENDENT_AMBULATORY_CARE_PROVIDER_SITE_OTHER): Payer: Medicare Other | Admitting: Cardiology

## 2014-07-28 VITALS — BP 110/60 | HR 54 | Ht 66.0 in | Wt 127.0 lb

## 2014-07-28 DIAGNOSIS — I10 Essential (primary) hypertension: Secondary | ICD-10-CM | POA: Diagnosis not present

## 2014-07-28 DIAGNOSIS — I5032 Chronic diastolic (congestive) heart failure: Secondary | ICD-10-CM | POA: Diagnosis not present

## 2014-07-28 DIAGNOSIS — R0602 Shortness of breath: Secondary | ICD-10-CM

## 2014-07-28 LAB — BASIC METABOLIC PANEL
BUN: 14 mg/dL (ref 6–23)
CALCIUM: 9.9 mg/dL (ref 8.4–10.5)
CO2: 32 mEq/L (ref 19–32)
CREATININE: 0.98 mg/dL (ref 0.40–1.20)
Chloride: 101 mEq/L (ref 96–112)
GFR: 68.78 mL/min (ref >60.00)
GLUCOSE: 92 mg/dL (ref 70–99)
Potassium: 4.2 mEq/L (ref 3.5–5.1)
Sodium: 137 mEq/L (ref 135–145)

## 2014-07-28 NOTE — Patient Instructions (Signed)
Medication Instructions:  Your physician recommends that you continue on your current medications as directed. Please refer to the Current Medication list given to you today.   Labwork: TODAY: BMET  Testing/Procedures: None  Follow-Up: Your physician wants you to follow-up in: 6 months with Dr. Turner. You will receive a reminder letter in the mail two months in advance. If you don't receive a letter, please call our office to schedule the follow-up appointment.  

## 2014-08-05 ENCOUNTER — Other Ambulatory Visit: Payer: Self-pay

## 2014-08-05 DIAGNOSIS — Z1231 Encounter for screening mammogram for malignant neoplasm of breast: Secondary | ICD-10-CM

## 2014-08-07 ENCOUNTER — Other Ambulatory Visit: Payer: Self-pay | Admitting: Cardiology

## 2014-08-24 DIAGNOSIS — J309 Allergic rhinitis, unspecified: Secondary | ICD-10-CM | POA: Diagnosis not present

## 2014-08-25 DIAGNOSIS — J019 Acute sinusitis, unspecified: Secondary | ICD-10-CM | POA: Diagnosis not present

## 2014-08-25 DIAGNOSIS — H66001 Acute suppurative otitis media without spontaneous rupture of ear drum, right ear: Secondary | ICD-10-CM | POA: Diagnosis not present

## 2014-08-25 DIAGNOSIS — J309 Allergic rhinitis, unspecified: Secondary | ICD-10-CM | POA: Diagnosis not present

## 2014-08-25 DIAGNOSIS — H1045 Other chronic allergic conjunctivitis: Secondary | ICD-10-CM | POA: Diagnosis not present

## 2014-09-07 ENCOUNTER — Ambulatory Visit
Admission: RE | Admit: 2014-09-07 | Discharge: 2014-09-07 | Disposition: A | Discharge status: Home / Self Care | Payer: Medicare Other | Source: Ambulatory Visit

## 2014-09-07 DIAGNOSIS — Z1231 Encounter for screening mammogram for malignant neoplasm of breast: Secondary | ICD-10-CM

## 2014-09-14 DIAGNOSIS — J309 Allergic rhinitis, unspecified: Secondary | ICD-10-CM | POA: Diagnosis not present

## 2014-09-14 DIAGNOSIS — H8110 Benign paroxysmal vertigo, unspecified ear: Secondary | ICD-10-CM | POA: Diagnosis not present

## 2014-09-25 DIAGNOSIS — I10 Essential (primary) hypertension: Secondary | ICD-10-CM | POA: Diagnosis not present

## 2014-09-25 DIAGNOSIS — H1045 Other chronic allergic conjunctivitis: Secondary | ICD-10-CM | POA: Diagnosis not present

## 2014-09-25 DIAGNOSIS — J019 Acute sinusitis, unspecified: Secondary | ICD-10-CM | POA: Diagnosis not present

## 2014-09-25 DIAGNOSIS — J309 Allergic rhinitis, unspecified: Secondary | ICD-10-CM | POA: Diagnosis not present

## 2014-10-26 ENCOUNTER — Telehealth: Payer: Self-pay | Admitting: Cardiology

## 2014-10-26 NOTE — Telephone Encounter (Signed)
Informed pharmacy technician that the Rx did not change, she is to take Ramipril 5 mg daily. Pharmacy tech st the patient said Dr. Lysle Rubens changed the dosage. Instructed pharmacy tech to contact Dr. Lysle Rubens about further refills since he changed the medication. Tech grateful for callback.

## 2014-10-26 NOTE — Telephone Encounter (Signed)
New message      Want to know how many ramipril pt is to take daily.  Directions say take 1 daily but pt has been taking 2 daily.  Has the presc changed?

## 2014-11-24 DIAGNOSIS — I10 Essential (primary) hypertension: Secondary | ICD-10-CM | POA: Diagnosis not present

## 2014-11-24 DIAGNOSIS — I519 Heart disease, unspecified: Secondary | ICD-10-CM | POA: Diagnosis not present

## 2014-11-24 DIAGNOSIS — E780 Pure hypercholesterolemia: Secondary | ICD-10-CM | POA: Diagnosis not present

## 2014-11-24 DIAGNOSIS — E1142 Type 2 diabetes mellitus with diabetic polyneuropathy: Secondary | ICD-10-CM | POA: Diagnosis not present

## 2014-11-24 DIAGNOSIS — Z1389 Encounter for screening for other disorder: Secondary | ICD-10-CM | POA: Diagnosis not present

## 2014-11-24 DIAGNOSIS — Z23 Encounter for immunization: Secondary | ICD-10-CM | POA: Diagnosis not present

## 2014-11-24 DIAGNOSIS — N182 Chronic kidney disease, stage 2 (mild): Secondary | ICD-10-CM | POA: Diagnosis not present

## 2014-11-24 DIAGNOSIS — D329 Benign neoplasm of meninges, unspecified: Secondary | ICD-10-CM | POA: Diagnosis not present

## 2014-11-24 DIAGNOSIS — E1122 Type 2 diabetes mellitus with diabetic chronic kidney disease: Secondary | ICD-10-CM | POA: Diagnosis not present

## 2014-11-26 DIAGNOSIS — E119 Type 2 diabetes mellitus without complications: Secondary | ICD-10-CM | POA: Diagnosis not present

## 2014-11-26 DIAGNOSIS — Z961 Presence of intraocular lens: Secondary | ICD-10-CM | POA: Diagnosis not present

## 2014-12-22 DIAGNOSIS — H1045 Other chronic allergic conjunctivitis: Secondary | ICD-10-CM | POA: Diagnosis not present

## 2014-12-22 DIAGNOSIS — J309 Allergic rhinitis, unspecified: Secondary | ICD-10-CM | POA: Diagnosis not present

## 2015-01-28 ENCOUNTER — Ambulatory Visit (INDEPENDENT_AMBULATORY_CARE_PROVIDER_SITE_OTHER): Payer: Medicare Other | Admitting: Cardiology

## 2015-01-28 ENCOUNTER — Encounter: Payer: Self-pay | Admitting: Cardiology

## 2015-01-28 VITALS — BP 126/70 | HR 55 | Ht 65.0 in | Wt 118.0 lb

## 2015-01-28 DIAGNOSIS — I5032 Chronic diastolic (congestive) heart failure: Secondary | ICD-10-CM | POA: Diagnosis not present

## 2015-01-28 DIAGNOSIS — R079 Chest pain, unspecified: Secondary | ICD-10-CM | POA: Diagnosis not present

## 2015-01-28 DIAGNOSIS — I10 Essential (primary) hypertension: Secondary | ICD-10-CM | POA: Diagnosis not present

## 2015-01-28 DIAGNOSIS — R0602 Shortness of breath: Secondary | ICD-10-CM | POA: Diagnosis not present

## 2015-01-28 LAB — BASIC METABOLIC PANEL
BUN: 22 mg/dL (ref 7–25)
CALCIUM: 9.5 mg/dL (ref 8.6–10.4)
CO2: 28 mmol/L (ref 20–31)
CREATININE: 0.91 mg/dL — AB (ref 0.60–0.88)
Chloride: 95 mmol/L — ABNORMAL LOW (ref 98–110)
Glucose, Bld: 84 mg/dL (ref 65–99)
Potassium: 4 mmol/L (ref 3.5–5.3)
Sodium: 133 mmol/L — ABNORMAL LOW (ref 135–146)

## 2015-01-28 NOTE — Progress Notes (Signed)
Cardiology Office Note   Date:  01/28/2015   ID:  Brenda Lynch, DOB 07-03-1923, MRN [id number]  PCP:  [name], MD    Chief Complaint  Patient presents with  . Congestive Heart Failure  . Hypertension      History of Present Illness: Brenda Lynch is a 81 y.o. female with a history of HTN, diastolic dysfunction with chronic diastolic CHF, chronic SOB secondary to chronic diastolic CHF/sedentary lifestyle and anxiety, chronically abnormal EKG with LVH and repolarization who presents today for followup. She is doing well. She denies any LE edema, dizziness, palpitations or syncope.She stays busy working in the yard.  She occasionally has some DOE but it is stable.  She says that once and a while she will notice some chest discomfort with exerting herself and doing daily house activities.     Past Medical History  Diagnosis Date  . Coronary artery disease   . Hypertension   . Diastolic dysfunction   . Hypercholesterolemia   . Spinal stenosis   . Meningioma ([place])   . Vertigo, benign positional   . Osteoarthritis   . Allergic rhinitis   . Diverticulosis   . Diabetes mellitus   . Chronic diastolic CHF (congestive heart failure) [facility])     Past Surgical History  Procedure Laterality Date  . Back surgery    . Tonsillectomy    . Breast biopsy       Current Outpatient Prescriptions  Medication Sig Dispense Refill  . ANUCORT-HC 25 MG suppository 2 (two) times daily as needed.     Marland Kitchen aspirin 81 MG tablet Take 81 mg by mouth daily.      Marland Kitchen atorvastatin (LIPITOR) 20 MG tablet Take 20 mg by mouth daily.    . calcium carbonate (TUMS - DOSED IN MG ELEMENTAL CALCIUM) 500 MG chewable tablet Chew 1 tablet by mouth daily.      . calcium citrate-vitamin D (CITRACAL+D) 315-200 MG-UNIT per tablet Take 1 tablet by mouth 2 (two) times daily.    . ferrous fumarate (HEMOCYTE - 106 MG FE) 325 (106 FE) MG TABS tablet Take 1 tablet by mouth as needed.     .  hydrochlorothiazide (HYDRODIURIL) 25 MG tablet Take 25 mg by mouth daily.    . Lancets (ONETOUCH ULTRASOFT) lancets     . metFORMIN (GLUCOPHAGE) 500 MG tablet Take 500 mg by mouth daily.    . metoprolol succinate (TOPROL-XL) 50 MG 24 hr tablet Take 50 mg by mouth daily.    . Misc Natural Products (OSTEO BI-FLEX ADV DOUBLE ST PO) Take by mouth.    . montelukast (SINGULAIR) 10 MG tablet Take by mouth as needed.     Marland Kitchen omeprazole (PRILOSEC) 10 MG capsule Take 10 mg by mouth daily.      . ONE TOUCH ULTRA TEST test strip     . potassium chloride (MICRO-K) 10 MEQ CR capsule Take 10 mEq by mouth daily.    . ramipril (ALTACE) 5 MG capsule TAKE (1) CAPSULE DAILY. 30 capsule 5   No current facility-administered medications for this visit.    Allergies:   Ciprofloxacin and Penicillins    Social History:  The patient  reports that she quit smoking about 36 years ago. She does not have any smokeless tobacco history on file. She reports that she does not drink alcohol or use illicit drugs.   Family History:  The patient's  family history includes Heart attack in her mother; Heart disease in her mother; Lung cancer in her brother; Pancreatic cancer in her brother; Prostate cancer in her father.    ROS:  Please see the history of present illness.   Otherwise, review of systems are positive for none.   All other systems are reviewed and negative.    PHYSICAL EXAM: VS:  BP 126/70 mmHg  Pulse 55  Ht 5\' 5"  (1.651 m)  Wt 118 lb (53.524 kg)  BMI 19.64 kg/m2 , BMI Body mass index is 19.64 kg/(m^2). GEN: Well nourished, well developed, in no acute distress HEENT: normal Neck: no JVD, carotid bruits, or masses Cardiac: RRR; no murmurs, rubs, or gallops,no edema  Respiratory:  clear to auscultation bilaterally, normal work of breathing GI: soft, nontender, nondistended, + BS MS: no deformity or atrophy Skin: warm and dry, no rash Neuro:  Strength and sensation are intact Psych: euthymic mood, full  affect   EKG:  EKG is ordered today. The ekg ordered today demonstrates sinus bradycardia at 55bpm with LVH with repolarization abnormality   Recent Labs: 07/28/2014: BUN 14; Creatinine, Ser 0.98; Potassium 4.2; Sodium 137    Lipid Panel No results found for: CHOL, TRIG, HDL, CHOLHDL, VLDL, LDLCALC, LDLDIRECT    Wt Readings from Last 3 Encounters:  01/28/15 118 lb (53.524 kg)  07/28/14 127 lb (57.607 kg)  01/29/14 138 lb (62.596 kg)    ASSESSMENT AND PLAN:  1. Diastolic dysfunction with chronic diastolic CHF - she appears euvolemic on exam - continue ramipril/HCTZ and metoprolol  - check BMET 2. Chronic SOB secondary to sedentary lifestyle and #1 - this is stable.  She is having some chest discomfort with exertion so I will get a Lexiscan myoview to rule out ischemia.  Her EKG shows LVH with repolarization change that is unchanged from 2015. 3. HTN - well controlled - continue HCTZ/metoprolol/ramipril     Current medicines are reviewed at length with the patient today.  The patient has no concerns regarding medicines.  The following changes have been made:  no change  Labs/ tests ordered today: See above Assessment and Plan No orders of the defined types were placed in this encounter.     Disposition:   FU with me in 6 months  Signed, Sueanne Margarita, MD  01/28/2015 10:48 AM    [facility] [address], [place], [place]  [address] Phone: [phone]; Fax: [phone]

## 2015-01-28 NOTE — Patient Instructions (Signed)
Medication Instructions:  Your physician recommends that you continue on your current medications as directed. Please refer to the Current Medication list given to you today.   Labwork: TODAY: BMET  Testing/Procedures: Your physician has requested that you have a lexiscan myoview. For further information please visit [email or web address]. Please follow instruction sheet, as given.  Follow-Up: Your physician wants you to follow-up in: 1 year with Dr. Radford Pax. You will receive a reminder letter in the mail two months in advance. If you don't receive a letter, please call our office to schedule the follow-up appointment.   Any Other Special Instructions Will Be Listed Below (If Applicable).     If you need a refill on your cardiac medications before your next appointment, please call your pharmacy.

## 2015-02-02 DIAGNOSIS — K5900 Constipation, unspecified: Secondary | ICD-10-CM | POA: Diagnosis not present

## 2015-02-02 DIAGNOSIS — K649 Unspecified hemorrhoids: Secondary | ICD-10-CM | POA: Diagnosis not present

## 2015-02-02 DIAGNOSIS — N3943 Post-void dribbling: Secondary | ICD-10-CM | POA: Diagnosis not present

## 2015-02-03 ENCOUNTER — Telehealth (HOSPITAL_COMMUNITY): Payer: Self-pay | Admitting: *Deleted

## 2015-02-03 DIAGNOSIS — N3943 Post-void dribbling: Secondary | ICD-10-CM | POA: Diagnosis not present

## 2015-02-03 NOTE — Telephone Encounter (Signed)
Patient given detailed instructions per Myocardial Perfusion Study Information Sheet for the test on 02/09/15 at 730. Patient notified to arrive 15 minutes early and that it is imperative to arrive on time for appointment to keep from having the test rescheduled.  If you need to cancel or reschedule your appointment, please call the office within 24 hours of your appointment. Failure to do so may result in a cancellation of your appointment, and a $50 no show fee. Patient verbalized understanding.Hubbard Robinson, RN

## 2015-02-06 ENCOUNTER — Other Ambulatory Visit: Payer: Self-pay | Admitting: Cardiology

## 2015-02-09 ENCOUNTER — Ambulatory Visit (HOSPITAL_COMMUNITY): Payer: Medicare Other | Attending: Internal Medicine

## 2015-02-09 DIAGNOSIS — I10 Essential (primary) hypertension: Secondary | ICD-10-CM | POA: Insufficient documentation

## 2015-02-09 DIAGNOSIS — R079 Chest pain, unspecified: Secondary | ICD-10-CM | POA: Insufficient documentation

## 2015-02-09 DIAGNOSIS — R0602 Shortness of breath: Secondary | ICD-10-CM | POA: Insufficient documentation

## 2015-02-09 DIAGNOSIS — R0609 Other forms of dyspnea: Secondary | ICD-10-CM | POA: Insufficient documentation

## 2015-02-09 DIAGNOSIS — E119 Type 2 diabetes mellitus without complications: Secondary | ICD-10-CM | POA: Diagnosis not present

## 2015-02-09 DIAGNOSIS — R002 Palpitations: Secondary | ICD-10-CM | POA: Diagnosis not present

## 2015-02-09 LAB — MYOCARDIAL PERFUSION IMAGING
CHL CUP NUCLEAR SDS: 1
CHL CUP NUCLEAR SRS: 6
CHL CUP RESTING HR STRESS: 55 {beats}/min
LV sys vol: 11 mL
LVDIAVOL: 48 mL
NUC STRESS TID: 1.05
Peak HR: 93 {beats}/min
RATE: 0.24
SSS: 7

## 2015-02-09 MED ORDER — TECHNETIUM TC 99M SESTAMIBI GENERIC - CARDIOLITE
32.8000 | Freq: Once | INTRAVENOUS | Status: AC | PRN
  Administered 2015-02-09: 32.8 via INTRAVENOUS

## 2015-02-09 MED ORDER — REGADENOSON 0.4 MG/5ML IV SOLN
0.4000 mg | Freq: Once | INTRAVENOUS | Status: AC
  Administered 2015-02-09: 0.4 mg via INTRAVENOUS

## 2015-02-09 MED ORDER — TECHNETIUM TC 99M SESTAMIBI GENERIC - CARDIOLITE
10.6000 | Freq: Once | INTRAVENOUS | Status: AC | PRN
  Administered 2015-02-09: 11 via INTRAVENOUS

## 2015-02-18 ENCOUNTER — Other Ambulatory Visit: Payer: Self-pay | Admitting: Neurosurgery

## 2015-02-18 DIAGNOSIS — D329 Benign neoplasm of meninges, unspecified: Secondary | ICD-10-CM

## 2015-02-26 ENCOUNTER — Inpatient Hospital Stay: Admission: RE | Admit: 2015-02-26 | Payer: Medicare Other | Source: Ambulatory Visit

## 2015-03-10 ENCOUNTER — Ambulatory Visit
Admission: RE | Admit: 2015-03-10 | Discharge: 2015-03-10 | Disposition: A | Discharge status: Home / Self Care | Payer: Medicare Other | Source: Ambulatory Visit | Attending: Neurosurgery | Admitting: Neurosurgery

## 2015-03-10 DIAGNOSIS — D329 Benign neoplasm of meninges, unspecified: Secondary | ICD-10-CM

## 2015-03-10 DIAGNOSIS — R51 Headache: Secondary | ICD-10-CM | POA: Diagnosis not present

## 2015-03-23 DIAGNOSIS — D329 Benign neoplasm of meninges, unspecified: Secondary | ICD-10-CM | POA: Diagnosis not present

## 2015-04-12 ENCOUNTER — Other Ambulatory Visit: Payer: Self-pay | Admitting: *Deleted

## 2015-04-12 ENCOUNTER — Telehealth: Payer: Self-pay | Admitting: Cardiology

## 2015-04-12 MED ORDER — METOPROLOL SUCCINATE ER 50 MG PO TB24: 50.0000 mg | ORAL_TABLET | Freq: Every day | ORAL | Status: DC

## 2015-04-12 NOTE — Telephone Encounter (Signed)
Follow Up   *STAT* If patient is at the pharmacy, call can be transferred to refill team.   1. Which medications need to be refilled? (please list name of each medication and dose if known)   metoprolol succinate (TOPROL-XL) 50 MG 24 hr tablet ramipril (ALTACE) 5 MG capsule   2. Which pharmacy/location (including street and city if local pharmacy) is medication to be sent to? [place] [id number]. Do they need a 30 day or 90 day supply? 30 day supply (Pt is really not sure which supply is needed    Pt states that the pharmacy does not have the scripts on file. Please call back to discuss

## 2015-06-16 ENCOUNTER — Other Ambulatory Visit (HOSPITAL_COMMUNITY): Payer: Self-pay | Admitting: Internal Medicine

## 2015-06-16 DIAGNOSIS — M7989 Other specified soft tissue disorders: Principal | ICD-10-CM

## 2015-06-16 DIAGNOSIS — R600 Localized edema: Secondary | ICD-10-CM | POA: Diagnosis not present

## 2015-06-16 DIAGNOSIS — M79605 Pain in left leg: Secondary | ICD-10-CM

## 2015-06-17 ENCOUNTER — Ambulatory Visit (HOSPITAL_COMMUNITY)
Admission: RE | Admit: 2015-06-17 | Discharge: 2015-06-17 | Disposition: A | Discharge status: Home / Self Care | Payer: Medicare Other | Source: Ambulatory Visit | Attending: Internal Medicine | Admitting: Internal Medicine

## 2015-06-17 DIAGNOSIS — M79605 Pain in left leg: Secondary | ICD-10-CM

## 2015-06-17 DIAGNOSIS — M79602 Pain in left arm: Secondary | ICD-10-CM | POA: Diagnosis not present

## 2015-06-17 DIAGNOSIS — M7989 Other specified soft tissue disorders: Secondary | ICD-10-CM

## 2015-06-17 NOTE — Progress Notes (Signed)
VASCULAR LAB PRELIMINARY  PRELIMINARY  PRELIMINARY  PRELIMINARY  Left lower extremity venous duplex completed.     Left:  No evidence of DVT, superficial thrombosis, or Baker's cyst.  Called Dr.Shamleffer office left message.  Jerret Mcbane, RVT, RDMS 06/17/2015, 9:42 AM

## 2015-06-21 ENCOUNTER — Other Ambulatory Visit: Payer: Self-pay | Admitting: Internal Medicine

## 2015-06-21 ENCOUNTER — Ambulatory Visit
Admission: RE | Admit: 2015-06-21 | Discharge: 2015-06-21 | Disposition: A | Discharge status: Home / Self Care | Payer: Medicare Other | Source: Ambulatory Visit | Attending: Internal Medicine | Admitting: Internal Medicine

## 2015-06-21 DIAGNOSIS — M25572 Pain in left ankle and joints of left foot: Secondary | ICD-10-CM | POA: Diagnosis not present

## 2015-06-21 DIAGNOSIS — M7989 Other specified soft tissue disorders: Secondary | ICD-10-CM | POA: Diagnosis not present

## 2015-06-21 DIAGNOSIS — M25472 Effusion, left ankle: Secondary | ICD-10-CM

## 2015-07-05 DIAGNOSIS — E1122 Type 2 diabetes mellitus with diabetic chronic kidney disease: Secondary | ICD-10-CM | POA: Diagnosis not present

## 2015-07-05 DIAGNOSIS — M25572 Pain in left ankle and joints of left foot: Secondary | ICD-10-CM | POA: Diagnosis not present

## 2015-07-05 DIAGNOSIS — Z7984 Long term (current) use of oral hypoglycemic drugs: Secondary | ICD-10-CM | POA: Diagnosis not present

## 2015-07-19 DIAGNOSIS — Z7984 Long term (current) use of oral hypoglycemic drugs: Secondary | ICD-10-CM | POA: Diagnosis not present

## 2015-07-19 DIAGNOSIS — I10 Essential (primary) hypertension: Secondary | ICD-10-CM | POA: Diagnosis not present

## 2015-07-19 DIAGNOSIS — E1122 Type 2 diabetes mellitus with diabetic chronic kidney disease: Secondary | ICD-10-CM | POA: Diagnosis not present

## 2015-07-19 DIAGNOSIS — M1990 Unspecified osteoarthritis, unspecified site: Secondary | ICD-10-CM | POA: Diagnosis not present

## 2015-07-19 DIAGNOSIS — E46 Unspecified protein-calorie malnutrition: Secondary | ICD-10-CM | POA: Diagnosis not present

## 2015-07-19 DIAGNOSIS — Z1389 Encounter for screening for other disorder: Secondary | ICD-10-CM | POA: Diagnosis not present

## 2015-07-19 DIAGNOSIS — N182 Chronic kidney disease, stage 2 (mild): Secondary | ICD-10-CM | POA: Diagnosis not present

## 2015-07-19 DIAGNOSIS — E7800 Pure hypercholesterolemia, unspecified: Secondary | ICD-10-CM | POA: Diagnosis not present

## 2015-07-19 DIAGNOSIS — M79673 Pain in unspecified foot: Secondary | ICD-10-CM | POA: Diagnosis not present

## 2015-07-19 DIAGNOSIS — I519 Heart disease, unspecified: Secondary | ICD-10-CM | POA: Diagnosis not present

## 2015-08-02 DIAGNOSIS — G8929 Other chronic pain: Secondary | ICD-10-CM | POA: Diagnosis not present

## 2015-08-02 DIAGNOSIS — M25572 Pain in left ankle and joints of left foot: Secondary | ICD-10-CM | POA: Diagnosis not present

## 2015-08-05 ENCOUNTER — Other Ambulatory Visit: Payer: Self-pay

## 2015-08-05 DIAGNOSIS — Z1231 Encounter for screening mammogram for malignant neoplasm of breast: Secondary | ICD-10-CM

## 2015-08-05 DIAGNOSIS — M79672 Pain in left foot: Secondary | ICD-10-CM | POA: Diagnosis not present

## 2015-08-20 DIAGNOSIS — N390 Urinary tract infection, site not specified: Secondary | ICD-10-CM | POA: Diagnosis not present

## 2015-08-20 DIAGNOSIS — R32 Unspecified urinary incontinence: Secondary | ICD-10-CM | POA: Diagnosis not present

## 2015-08-20 DIAGNOSIS — B373 Candidiasis of vulva and vagina: Secondary | ICD-10-CM | POA: Diagnosis not present

## 2015-08-26 DIAGNOSIS — R3989 Other symptoms and signs involving the genitourinary system: Secondary | ICD-10-CM | POA: Diagnosis not present

## 2015-08-30 DIAGNOSIS — N399 Disorder of urinary system, unspecified: Secondary | ICD-10-CM | POA: Diagnosis not present

## 2015-08-30 DIAGNOSIS — R3911 Hesitancy of micturition: Secondary | ICD-10-CM | POA: Diagnosis not present

## 2015-09-01 DIAGNOSIS — R338 Other retention of urine: Secondary | ICD-10-CM | POA: Diagnosis not present

## 2015-09-01 DIAGNOSIS — R3911 Hesitancy of micturition: Secondary | ICD-10-CM | POA: Diagnosis not present

## 2015-09-08 DIAGNOSIS — R0989 Other specified symptoms and signs involving the circulatory and respiratory systems: Secondary | ICD-10-CM | POA: Diagnosis not present

## 2015-09-08 DIAGNOSIS — E46 Unspecified protein-calorie malnutrition: Secondary | ICD-10-CM | POA: Diagnosis not present

## 2015-09-16 ENCOUNTER — Ambulatory Visit
Admission: RE | Admit: 2015-09-16 | Discharge: 2015-09-16 | Disposition: A | Discharge status: Home / Self Care | Payer: Medicare Other | Source: Ambulatory Visit

## 2015-09-16 DIAGNOSIS — R42 Dizziness and giddiness: Secondary | ICD-10-CM | POA: Insufficient documentation

## 2015-09-16 DIAGNOSIS — Z1231 Encounter for screening mammogram for malignant neoplasm of breast: Secondary | ICD-10-CM

## 2015-09-16 DIAGNOSIS — R519 Headache, unspecified: Secondary | ICD-10-CM | POA: Insufficient documentation

## 2015-09-16 DIAGNOSIS — R0982 Postnasal drip: Secondary | ICD-10-CM | POA: Insufficient documentation

## 2015-09-16 DIAGNOSIS — R51 Headache: Secondary | ICD-10-CM

## 2015-09-27 DIAGNOSIS — R0982 Postnasal drip: Secondary | ICD-10-CM | POA: Diagnosis not present

## 2015-09-27 DIAGNOSIS — R51 Headache: Secondary | ICD-10-CM | POA: Diagnosis not present

## 2015-09-27 DIAGNOSIS — R42 Dizziness and giddiness: Secondary | ICD-10-CM | POA: Diagnosis not present

## 2015-09-27 DIAGNOSIS — H903 Sensorineural hearing loss, bilateral: Secondary | ICD-10-CM | POA: Diagnosis not present

## 2015-09-27 DIAGNOSIS — M799 Soft tissue disorder, unspecified: Secondary | ICD-10-CM | POA: Diagnosis not present

## 2015-10-19 DIAGNOSIS — R3911 Hesitancy of micturition: Secondary | ICD-10-CM | POA: Diagnosis not present

## 2015-10-19 DIAGNOSIS — R3912 Poor urinary stream: Secondary | ICD-10-CM | POA: Diagnosis not present

## 2015-11-11 DIAGNOSIS — D329 Benign neoplasm of meninges, unspecified: Secondary | ICD-10-CM | POA: Diagnosis not present

## 2015-11-11 DIAGNOSIS — E1142 Type 2 diabetes mellitus with diabetic polyneuropathy: Secondary | ICD-10-CM | POA: Diagnosis not present

## 2015-11-11 DIAGNOSIS — E46 Unspecified protein-calorie malnutrition: Secondary | ICD-10-CM | POA: Diagnosis not present

## 2015-11-11 DIAGNOSIS — E1122 Type 2 diabetes mellitus with diabetic chronic kidney disease: Secondary | ICD-10-CM | POA: Diagnosis not present

## 2015-11-11 DIAGNOSIS — N182 Chronic kidney disease, stage 2 (mild): Secondary | ICD-10-CM | POA: Diagnosis not present

## 2015-11-11 DIAGNOSIS — K219 Gastro-esophageal reflux disease without esophagitis: Secondary | ICD-10-CM | POA: Diagnosis not present

## 2015-11-11 DIAGNOSIS — E7800 Pure hypercholesterolemia, unspecified: Secondary | ICD-10-CM | POA: Diagnosis not present

## 2015-11-11 DIAGNOSIS — I10 Essential (primary) hypertension: Secondary | ICD-10-CM | POA: Diagnosis not present

## 2015-12-14 DIAGNOSIS — R102 Pelvic and perineal pain: Secondary | ICD-10-CM | POA: Diagnosis not present

## 2016-01-05 DIAGNOSIS — Z23 Encounter for immunization: Secondary | ICD-10-CM | POA: Diagnosis not present

## 2016-01-09 ENCOUNTER — Other Ambulatory Visit: Payer: Self-pay | Admitting: Cardiology

## 2016-01-28 ENCOUNTER — Encounter (INDEPENDENT_AMBULATORY_CARE_PROVIDER_SITE_OTHER): Payer: Self-pay

## 2016-01-28 ENCOUNTER — Ambulatory Visit (INDEPENDENT_AMBULATORY_CARE_PROVIDER_SITE_OTHER): Payer: Medicare Other | Admitting: Cardiology

## 2016-01-28 ENCOUNTER — Encounter: Payer: Self-pay | Admitting: Cardiology

## 2016-01-28 VITALS — BP 124/58 | HR 52 | Ht 65.0 in | Wt 108.8 lb

## 2016-01-28 DIAGNOSIS — I10 Essential (primary) hypertension: Secondary | ICD-10-CM | POA: Diagnosis not present

## 2016-01-28 DIAGNOSIS — T50905A Adverse effect of unspecified drugs, medicaments and biological substances, initial encounter: Secondary | ICD-10-CM

## 2016-01-28 DIAGNOSIS — R001 Bradycardia, unspecified: Secondary | ICD-10-CM | POA: Diagnosis not present

## 2016-01-28 DIAGNOSIS — I5032 Chronic diastolic (congestive) heart failure: Secondary | ICD-10-CM

## 2016-01-28 HISTORY — DX: Bradycardia, unspecified: R00.1

## 2016-01-28 LAB — BASIC METABOLIC PANEL
BUN: 18 mg/dL (ref 7–25)
CALCIUM: 8.8 mg/dL (ref 8.6–10.4)
CO2: 22 mmol/L (ref 20–31)
CREATININE: 0.95 mg/dL — AB (ref 0.60–0.88)
Chloride: 98 mmol/L (ref 98–110)
Glucose, Bld: 86 mg/dL (ref 65–99)
Potassium: 4.4 mmol/L (ref 3.5–5.3)
Sodium: 133 mmol/L — ABNORMAL LOW (ref 135–146)

## 2016-01-28 MED ORDER — METOPROLOL SUCCINATE ER 25 MG PO TB24: 25.0000 mg | ORAL_TABLET | Freq: Every day | ORAL | 11 refills | Status: DC

## 2016-01-28 NOTE — Progress Notes (Signed)
Cardiology Office Note    Date:  01/28/2016   ID:  Brenda Lynch, DOB 10-Oct-1923, MRN [id number]  PCP:  [name], MD  Cardiologist:  Fransico Him, MD   Chief Complaint  Patient presents with  . Congestive Heart Failure  . Hypertension    History of Present Illness:  Brenda Lynch is a 82 y.o. female with a history of HTN, diastolic dysfunction with chronic diastolic CHF, chronic SOB secondary to chronic diastolic CHF/sedentary lifestyle and anxiety, chronically abnormal EKG with LVH and repolarization who presents today for followup. She is doing well. She denies any LE edema, dizziness, palpitations or syncope.She stays busy working in the yard.  She occasionally has some DOE when she gets anxious but she is very active without any problems.      Past Medical History:  Diagnosis Date  . Allergic rhinitis   . Chronic diastolic CHF (congestive heart failure) ([place])   . Coronary artery disease   . Diabetes mellitus   . Diastolic dysfunction   . Diverticulosis   . Drug-induced bradycardia 01/28/2016  . Hypercholesterolemia   . Hypertension   . Meningioma ([place])   . Osteoarthritis   . Spinal stenosis   . Vertigo, benign positional     Past Surgical History:  Procedure Laterality Date  . BACK SURGERY    . BREAST BIOPSY    . TONSILLECTOMY      Current Medications: Outpatient Medications Prior to Visit  Medication Sig Dispense Refill  . ANUCORT-HC 25 MG suppository 2 (two) times daily as needed.     Marland Kitchen aspirin 81 MG tablet Take 81 mg by mouth daily.      Marland Kitchen atorvastatin (LIPITOR) 20 MG tablet Take 20 mg by mouth daily.    . calcium carbonate (TUMS - DOSED IN MG ELEMENTAL CALCIUM) 500 MG chewable tablet Chew 1 tablet by mouth daily.      . calcium citrate-vitamin D (CITRACAL+D) 315-200 MG-UNIT per tablet Take 1 tablet by mouth 2 (two) times daily.    . ferrous fumarate (HEMOCYTE - 106 MG FE) 325 (106 FE) MG TABS tablet Take 1 tablet by mouth as needed.     .  hydrochlorothiazide (HYDRODIURIL) 25 MG tablet Take 25 mg by mouth daily.    . Lancets (ONETOUCH ULTRASOFT) lancets     . metFORMIN (GLUCOPHAGE) 500 MG tablet Take 500 mg by mouth daily.    . metoprolol succinate (TOPROL-XL) 50 MG 24 hr tablet Take 1 tablet (50 mg total) by mouth daily. 30 tablet 11  . Misc Natural Products (OSTEO BI-FLEX ADV DOUBLE ST PO) Take by mouth.    . montelukast (SINGULAIR) 10 MG tablet Take by mouth as needed.     Marland Kitchen omeprazole (PRILOSEC) 10 MG capsule Take 10 mg by mouth daily.      . ONE TOUCH ULTRA TEST test strip     . potassium chloride (MICRO-K) 10 MEQ CR capsule Take 10 mEq by mouth daily.    . ramipril (ALTACE) 5 MG capsule Take 1 capsule (5 mg total) by mouth daily. 30 capsule 0   No facility-administered medications prior to visit.      Allergies:   Ciprofloxacin; Penicillin g; and Penicillins   Social History   Social History  . Marital status: Married    Spouse name: N/A  . Number of children: N/A  . Years of education: N/A   Social History Main Topics  . Smoking status: Former Smoker    Quit date: 01/30/1979  .  Smokeless tobacco: Never Used  . Alcohol use No  . Drug use: No  . Sexual activity: Not Asked   Other Topics Concern  . None   Social History Narrative  . None     Family History:  The patient's family history includes Heart attack in her mother; Heart disease in her mother; Lung cancer in her brother; Pancreatic cancer in her brother; Prostate cancer in her father.   ROS:   Please see the history of present illness.    ROS All other systems reviewed and are negative.  No flowsheet data found.     PHYSICAL EXAM:   VS:  BP (!) 124/58   Pulse (!) 52   Ht 5\' 5"  (1.651 m)   Wt 108 lb 12.8 oz (49.4 kg)   BMI 18.11 kg/m    GEN: Well nourished, well developed, in no acute distress  HEENT: normal  Neck: no JVD, carotid bruits, or masses Cardiac: RRR; no murmurs, rubs, or gallops,no edema.  Intact distal pulses  bilaterally.  Respiratory:  clear to auscultation bilaterally, normal work of breathing GI: soft, nontender, nondistended, + BS MS: no deformity or atrophy  Skin: warm and dry, no rash Neuro:  Alert and Oriented x 3, Strength and sensation are intact Psych: euthymic mood, full affect  Wt Readings from Last 3 Encounters:  01/28/16 108 lb 12.8 oz (49.4 kg)  02/09/15 118 lb (53.5 kg)  01/28/15 118 lb (53.5 kg)      Studies/Labs Reviewed:   EKG:  EKG is ordered today and showed sinus bradycardia at 52bpm with inferolateral and anterolateral T wave abnormality and LVH - no change from EKG a year ago  Recent Labs: 01/28/2015: BUN 22; Creat 0.91; Potassium 4.0; Sodium 133   Lipid Panel No results found for: CHOL, TRIG, HDL, CHOLHDL, VLDL, LDLCALC, LDLDIRECT  Additional studies/ records that were reviewed today include:  none    ASSESSMENT:    1. Chronic diastolic CHF (congestive heart failure) ([place])   2. Essential hypertension   3. Drug-induced bradycardia      PLAN:  In order of problems listed above:  1. Chronic diastolic CHF - she appears euvolemic on exam and weight is stable.  Continue diuretic and BB.  Check BMET. 2. HTN - BP controlled on current meds. Continue BB and ACE I. 3. Drug induced bradycardia - I will decrease her Toprol to 25mg  daily since her HR is in the low 50's.      Medication Adjustments/Labs and Tests Ordered: Current medicines are reviewed at length with the patient today.  Concerns regarding medicines are outlined above.  Medication changes, Labs and Tests ordered today are listed in the Patient Instructions below.  There are no Patient Instructions on file for this visit.   Signed, Fransico Him, MD  01/28/2016 10:27 AM    [address], [place], [place]  [address] Phone: [phone]; Fax: [phone]

## 2016-01-28 NOTE — Patient Instructions (Signed)
Medication Instructions:  1) DECREASE TOPROL to 25 mg daily   Labwork: TODAY: BMET  Testing/Procedures: None  Follow-Up: Your physician wants you to follow-up in: 6 months with Dr. Radford Pax. You will receive a reminder letter in the mail two months in advance. If you don't receive a letter, please call our office to schedule the follow-up appointment.   Any Other Special Instructions Will Be Listed Below (If Applicable).     If you need a refill on your cardiac medications before your next appointment, please call your pharmacy.

## 2016-02-12 ENCOUNTER — Other Ambulatory Visit: Payer: Self-pay | Admitting: Cardiology

## 2016-02-14 ENCOUNTER — Other Ambulatory Visit: Payer: Self-pay | Admitting: *Deleted

## 2016-02-14 MED ORDER — RAMIPRIL 5 MG PO CAPS: 5.0000 mg | ORAL_CAPSULE | Freq: Every day | ORAL | 11 refills | Status: DC

## 2016-02-22 DIAGNOSIS — E46 Unspecified protein-calorie malnutrition: Secondary | ICD-10-CM | POA: Diagnosis not present

## 2016-02-22 DIAGNOSIS — Z7984 Long term (current) use of oral hypoglycemic drugs: Secondary | ICD-10-CM | POA: Diagnosis not present

## 2016-02-22 DIAGNOSIS — E7800 Pure hypercholesterolemia, unspecified: Secondary | ICD-10-CM | POA: Diagnosis not present

## 2016-02-22 DIAGNOSIS — I10 Essential (primary) hypertension: Secondary | ICD-10-CM | POA: Diagnosis not present

## 2016-02-22 DIAGNOSIS — E1122 Type 2 diabetes mellitus with diabetic chronic kidney disease: Secondary | ICD-10-CM | POA: Diagnosis not present

## 2016-02-22 DIAGNOSIS — Z23 Encounter for immunization: Secondary | ICD-10-CM | POA: Diagnosis not present

## 2016-02-22 DIAGNOSIS — E1142 Type 2 diabetes mellitus with diabetic polyneuropathy: Secondary | ICD-10-CM | POA: Diagnosis not present

## 2016-02-22 DIAGNOSIS — I519 Heart disease, unspecified: Secondary | ICD-10-CM | POA: Diagnosis not present

## 2016-02-22 DIAGNOSIS — N182 Chronic kidney disease, stage 2 (mild): Secondary | ICD-10-CM | POA: Diagnosis not present

## 2016-07-08 ENCOUNTER — Emergency Department (HOSPITAL_COMMUNITY): Payer: Medicare Other

## 2016-07-08 ENCOUNTER — Emergency Department (HOSPITAL_COMMUNITY)
Admission: EM | Admit: 2016-07-08 | Discharge: 2016-07-08 | Disposition: A | Discharge status: Home / Self Care | Payer: Medicare Other | Attending: Emergency Medicine | Admitting: Emergency Medicine

## 2016-07-08 ENCOUNTER — Encounter (HOSPITAL_COMMUNITY): Payer: Self-pay | Admitting: Nurse Practitioner

## 2016-07-08 DIAGNOSIS — W228XXA Striking against or struck by other objects, initial encounter: Secondary | ICD-10-CM | POA: Diagnosis not present

## 2016-07-08 DIAGNOSIS — Z79899 Other long term (current) drug therapy: Secondary | ICD-10-CM | POA: Insufficient documentation

## 2016-07-08 DIAGNOSIS — Y999 Unspecified external cause status: Secondary | ICD-10-CM | POA: Insufficient documentation

## 2016-07-08 DIAGNOSIS — S6992XA Unspecified injury of left wrist, hand and finger(s), initial encounter: Secondary | ICD-10-CM | POA: Diagnosis present

## 2016-07-08 DIAGNOSIS — R52 Pain, unspecified: Secondary | ICD-10-CM | POA: Diagnosis not present

## 2016-07-08 DIAGNOSIS — I5032 Chronic diastolic (congestive) heart failure: Secondary | ICD-10-CM | POA: Diagnosis not present

## 2016-07-08 DIAGNOSIS — S60312A Abrasion of left thumb, initial encounter: Secondary | ICD-10-CM | POA: Diagnosis not present

## 2016-07-08 DIAGNOSIS — Y929 Unspecified place or not applicable: Secondary | ICD-10-CM | POA: Insufficient documentation

## 2016-07-08 DIAGNOSIS — E119 Type 2 diabetes mellitus without complications: Secondary | ICD-10-CM | POA: Diagnosis not present

## 2016-07-08 DIAGNOSIS — Z7982 Long term (current) use of aspirin: Secondary | ICD-10-CM | POA: Diagnosis not present

## 2016-07-08 DIAGNOSIS — Z87891 Personal history of nicotine dependence: Secondary | ICD-10-CM | POA: Diagnosis not present

## 2016-07-08 DIAGNOSIS — R930 Abnormal findings on diagnostic imaging of skull and head, not elsewhere classified: Secondary | ICD-10-CM | POA: Diagnosis not present

## 2016-07-08 DIAGNOSIS — I2510 Atherosclerotic heart disease of native coronary artery without angina pectoris: Secondary | ICD-10-CM | POA: Insufficient documentation

## 2016-07-08 DIAGNOSIS — Y939 Activity, unspecified: Secondary | ICD-10-CM | POA: Diagnosis not present

## 2016-07-08 DIAGNOSIS — I110 Hypertensive heart disease with heart failure: Secondary | ICD-10-CM | POA: Diagnosis not present

## 2016-07-08 DIAGNOSIS — Z7984 Long term (current) use of oral hypoglycemic drugs: Secondary | ICD-10-CM | POA: Diagnosis not present

## 2016-07-08 DIAGNOSIS — M791 Myalgia: Secondary | ICD-10-CM | POA: Diagnosis not present

## 2016-07-08 MED ORDER — TRAMADOL HCL 50 MG PO TABS: 50.0000 mg | ORAL_TABLET | Freq: Once | ORAL | Status: DC

## 2016-07-08 NOTE — ED Provider Notes (Signed)
[facility] DEPT Provider Note   CSN: [id number] Arrival date & time: 07/08/16  1414     History   Chief Complaint Chief Complaint  Patient presents with  . Injury    HPI Brenda Lynch is a 83 y.o. female.  HPI  83 yo F with PMHx CHF, CAD, DM, HTN, HLD here with diffuse body pain. Pt was doing work around the house today when she put her hand on a drawer to stand up. The drawer then fell forward, hitting the wall on the opposite side. It did not fall onto her but it was full of heavy books which landed on her head and body. No LOC. She reports diffuse body aches and pains but no specific areas of increased TTP. No vomiting. No chest pain, SOB, palpitations. No blood thinner use. She has been ambulatory since the fall.  Past Medical History:  Diagnosis Date  . Allergic rhinitis   . Chronic diastolic CHF (congestive heart failure) ([place])   . Coronary artery disease   . Diabetes mellitus   . Diastolic dysfunction   . Diverticulosis   . Drug-induced bradycardia 01/28/2016  . Hypercholesterolemia   . Hypertension   . Meningioma ([place])   . Osteoarthritis   . Spinal stenosis   . Vertigo, benign positional     Patient Active Problem List   Diagnosis Date Noted  . Drug-induced bradycardia 01/28/2016  . SOB (shortness of breath) 07/29/2013  . Hypertension   . Diastolic dysfunction   . Hypercholesterolemia   . Diabetes mellitus   . Chronic diastolic CHF (congestive heart failure) ([place])     Past Surgical History:  Procedure Laterality Date  . BACK SURGERY    . BREAST BIOPSY    . TONSILLECTOMY      OB History    No data available       Home Medications    Prior to Admission medications   Medication Sig Start Date End Date Taking? Authorizing Provider  acetaminophen (TYLENOL) 500 MG tablet Take 500 mg by mouth every 6 (six) hours as needed for headache (pain).   Yes Historical Provider, MD  aspirin EC 81 MG tablet Take 81 mg by mouth daily.   Yes Historical  Provider, MD  atorvastatin (LIPITOR) 20 MG tablet Take 20 mg by mouth daily. 07/17/14  Yes Historical Provider, MD  calcium carbonate (TUMS - DOSED IN MG ELEMENTAL CALCIUM) 500 MG chewable tablet Chew 1 tablet by mouth daily as needed for indigestion or heartburn.    Yes Historical Provider, MD  calcium citrate-vitamin D (CITRACAL+D) 315-200 MG-UNIT per tablet Take 1 tablet by mouth 2 (two) times daily.   Yes Historical Provider, MD  cetirizine (ZYRTEC) 10 MG tablet Take 10 mg by mouth daily.   Yes Historical Provider, MD  cholecalciferol (VITAMIN D) 1000 units tablet Take 1,000 Units by mouth daily.   Yes Historical Provider, MD  estradiol (ESTRACE) 0.1 MG/GM vaginal cream Place 1 Applicatorful vaginally 2 (two) times a week. Monday and Friday   Yes Historical Provider, MD  hydrochlorothiazide (HYDRODIURIL) 25 MG tablet Take 25 mg by mouth daily. 12/25/14  Yes Historical Provider, MD  hydrocortisone (ANUSOL-HC) 25 MG suppository Place 25 mg rectally 2 (two) times daily as needed for hemorrhoids or itching.   Yes Historical Provider, MD  indomethacin (INDOCIN) 50 MG capsule Take 50 mg by mouth 2 (two) times daily as needed (pain). Take with food or milk   Yes Historical Provider, MD  metFORMIN (GLUCOPHAGE) 500 MG tablet  Take 500 mg by mouth 2 (two) times daily with a meal.  07/06/14  Yes Historical Provider, MD  metoprolol succinate (TOPROL-XL) 25 MG 24 hr tablet Take 1 tablet (25 mg total) by mouth daily. 01/28/16  Yes Sueanne Margarita, MD  Misc Natural Products (OSTEO BI-FLEX ADV DOUBLE ST PO) Take 1 tablet by mouth daily as needed (knee pain).    Yes Historical Provider, MD  montelukast (SINGULAIR) 10 MG tablet Take 10 mg by mouth at bedtime.  12/12/12  Yes Historical Provider, MD  omeprazole (PRILOSEC OTC) 20 MG tablet Take 20 mg by mouth daily.   Yes Historical Provider, MD  potassium chloride (MICRO-K) 10 MEQ CR capsule Take 10 mEq by mouth daily. 07/17/14  Yes Historical Provider, MD  ramipril  (ALTACE) 5 MG capsule Take 1 capsule (5 mg total) by mouth daily. 02/14/16  Yes Sueanne Margarita, MD  senna (SENOKOT) 8.6 MG TABS tablet Take 2 tablets by mouth at bedtime as needed for mild constipation.   Yes Historical Provider, MD  triamcinolone (NASACORT) 55 MCG/ACT AERO nasal inhaler Place 2 sprays into the nose daily.   Yes Historical Provider, MD  ferrous fumarate (HEMOCYTE - 106 MG FE) 325 (106 FE) MG TABS tablet Take 1 tablet by mouth as needed.     Historical Provider, MD  Lancets Glory Rosebush ULTRASOFT) lancets  05/01/14   Historical Provider, MD  ONE TOUCH ULTRA TEST test strip  06/21/14   Historical Provider, MD    Family History Family History  Problem Relation Age of Onset  . Heart disease Mother   . Heart attack Mother   . Prostate cancer Father   . Lung cancer Brother   . Pancreatic cancer Brother     Social History Social History  Substance Use Topics  . Smoking status: Former Smoker    Quit date: 01/30/1979  . Smokeless tobacco: Never Used  . Alcohol use No     Allergies   Ciprofloxacin and Penicillins   Review of Systems Review of Systems  Constitutional: Negative for chills, fatigue and fever.  HENT: Negative for congestion and rhinorrhea.   Eyes: Negative for visual disturbance.  Respiratory: Negative for cough, shortness of breath and wheezing.   Cardiovascular: Negative for chest pain and leg swelling.  Gastrointestinal: Negative for abdominal pain, diarrhea, nausea and vomiting.  Genitourinary: Negative for dysuria and flank pain.  Musculoskeletal: Positive for arthralgias and myalgias. Negative for neck pain and neck stiffness.  Skin: Negative for rash and wound.  Allergic/Immunologic: Negative for immunocompromised state.  Neurological: Negative for syncope, weakness and headaches.  All other systems reviewed and are negative.    Physical Exam Updated Vital Signs BP (!) 160/71   Pulse 66   Temp 98.4 F (36.9 C) (Oral)   Resp 18   SpO2 100%     Physical Exam  Constitutional: She is oriented to person, place, and time. She appears well-developed and well-nourished. No distress.  HENT:  Head: Normocephalic and atraumatic.  Mouth/Throat: Oropharynx is clear and moist.  No apparent head trauma  Eyes: Conjunctivae are normal. Pupils are equal, round, and reactive to light.  Neck: Neck supple.  Cardiovascular: Normal rate, regular rhythm and normal heart sounds.  Exam reveals no friction rub.   No murmur heard. Pulmonary/Chest: Effort normal and breath sounds normal. No respiratory distress. She has no wheezes. She has no rales. She exhibits no tenderness (no chest wall or posterior chest TTP).  Abdominal: Soft. Bowel sounds are normal. She exhibits  no distension. There is no tenderness. There is no guarding.  Musculoskeletal: She exhibits no edema.  Neurological: She is alert and oriented to person, place, and time. She exhibits normal muscle tone.  Skin: Skin is warm. Capillary refill takes less than 2 seconds.  Superficial scratch to left first finger. No ecchymoses or bony deformity. No other areas of open wounds or bruising.  Psychiatric: She has a normal mood and affect.  Nursing note and vitals reviewed.    ED Treatments / Results  Labs (all labs ordered are listed, but only abnormal results are displayed) Labs Reviewed - No data to display  EKG  EKG Interpretation None       Radiology Dg Chest 2 View  Result Date: 07/08/2016 CLINICAL DATA:  83 year old female with history of trauma after being struck by a cabinet. EXAM: CHEST  2 VIEW COMPARISON:  No priors. FINDINGS: Lung volumes are normal. No consolidative airspace disease. No pleural effusions. No pneumothorax. No pulmonary nodule or mass noted. Pulmonary vasculature and the cardiomediastinal silhouette are within normal limits. Atherosclerosis in the thoracic aorta. IMPRESSION: 1.  No radiographic evidence of acute cardiopulmonary disease. 2. Aortic  atherosclerosis. Electronically Signed   By: Vinnie Langton M.D.   On: 07/08/2016 16:53   Dg Lumbar Spine Complete  Result Date: 07/08/2016 CLINICAL DATA:  83 year old female with history of trauma after being struck by a cabinet. EXAM: LUMBAR SPINE - COMPLETE 4+ VIEW COMPARISON:  No priors. FINDINGS: No acute displaced fracture of the lumbar spine. Severe multilevel degenerative disc disease, most pronounced at L3-L4, L4-L5 and L5-S1. Severe multilevel facet arthropathy. No definite defects of the pars interarticularis are noted. Severe atherosclerosis in the abdominal aorta and pelvic vasculature. IMPRESSION: 1. No acute radiographic abnormality of the lumbar spine. 2. Severe multilevel degenerative disc disease and lumbar spondylosis, as above. 3. Aortic atherosclerosis. Electronically Signed   By: Vinnie Langton M.D.   On: 07/08/2016 16:55   Ct Head Wo Contrast  Result Date: 07/08/2016 CLINICAL DATA:  Furniture fell on top of patient. EXAM: CT HEAD WITHOUT CONTRAST CT CERVICAL SPINE WITHOUT CONTRAST TECHNIQUE: Multidetector CT imaging of the head and cervical spine was performed following the standard protocol without intravenous contrast. Multiplanar CT image reconstructions of the cervical spine were also generated. COMPARISON:  None. FINDINGS: CT HEAD FINDINGS Brain: No mass effect, intraparenchymal hemorrhage or extra-axial collection. No evidence of acute cortical infarct. Calcified meningioma along the anterior cerebral falx is unchanged. Vascular: No hyperdense vessel or unexpected calcification. Skull: Normal visualized skull base, calvarium and extracranial soft tissues. Sinuses/Orbits: No sinus fluid levels or advanced mucosal thickening. No mastoid effusion. Normal orbits. CT CERVICAL SPINE FINDINGS Alignment: No static subluxation. Facets are aligned. Occipital condyles are normally positioned. Skull base and vertebrae: No acute fracture. Soft tissues and spinal canal: No prevertebral  fluid or swelling. No visible canal hematoma. Disc levels: There is multifocal ossification of the posterior longitudinal ligament greatest at C3-4 and C4-5 resulting in mild spinal canal stenosis. There is advanced neural foraminal stenosis at right C5-6 and left C6-7. Upper chest: Biapical emphysema. Other: Patulous upper thoracic esophagus. IMPRESSION: 1. Unchanged calcified falcine meningioma. No acute intracranial abnormality. 2. No acute fracture or static subluxation of the cervical spine. Electronically Signed   By: Ulyses Jarred M.D.   On: 07/08/2016 17:56   Ct Cervical Spine Wo Contrast  Result Date: 07/08/2016 CLINICAL DATA:  Furniture fell on top of patient. EXAM: CT HEAD WITHOUT CONTRAST CT CERVICAL SPINE WITHOUT CONTRAST TECHNIQUE:  Multidetector CT imaging of the head and cervical spine was performed following the standard protocol without intravenous contrast. Multiplanar CT image reconstructions of the cervical spine were also generated. COMPARISON:  None. FINDINGS: CT HEAD FINDINGS Brain: No mass effect, intraparenchymal hemorrhage or extra-axial collection. No evidence of acute cortical infarct. Calcified meningioma along the anterior cerebral falx is unchanged. Vascular: No hyperdense vessel or unexpected calcification. Skull: Normal visualized skull base, calvarium and extracranial soft tissues. Sinuses/Orbits: No sinus fluid levels or advanced mucosal thickening. No mastoid effusion. Normal orbits. CT CERVICAL SPINE FINDINGS Alignment: No static subluxation. Facets are aligned. Occipital condyles are normally positioned. Skull base and vertebrae: No acute fracture. Soft tissues and spinal canal: No prevertebral fluid or swelling. No visible canal hematoma. Disc levels: There is multifocal ossification of the posterior longitudinal ligament greatest at C3-4 and C4-5 resulting in mild spinal canal stenosis. There is advanced neural foraminal stenosis at right C5-6 and left C6-7. Upper chest:  Biapical emphysema. Other: Patulous upper thoracic esophagus. IMPRESSION: 1. Unchanged calcified falcine meningioma. No acute intracranial abnormality. 2. No acute fracture or static subluxation of the cervical spine. Electronically Signed   By: Ulyses Jarred M.D.   On: 07/08/2016 17:56    Procedures Procedures (including critical care time)  Medications Ordered in ED Medications  traMADol (ULTRAM) tablet 50 mg (not administered)     Initial Impression / Assessment and Plan / ED Course  I have reviewed the triage vital signs and the nursing notes.  Pertinent labs & imaging results that were available during my care of the patient were reviewed by me and considered in my medical decision making (see chart for details).     83 yo F here with diffuse anterior body pain after books fell onto her prior to arrival. She is alert, oriented, well-appearing and at neuro baseline. There was no LOC. Exam as above, negative for any bony TTP and CT, imaging is all negative. Her pain is controlled. D/c'ed with supportive care.  Final Clinical Impressions(s) / ED Diagnoses   Final diagnoses:  Body aches    New Prescriptions New Prescriptions   No medications on file     Duffy Bruce, MD 07/08/16 1816

## 2016-07-08 NOTE — ED Triage Notes (Signed)
Pt presents with c/o injury. She was using a cabinet door to pull herself up and the cabinet was not mounted onto the wall. The cabinet fell onto her spilling all of its contents including heavy books. She c/o generalized soreness over her entire body from the cabinet and its contents hitting her.she denies any loss of consciousness.

## 2016-07-08 NOTE — ED Notes (Signed)
Patient transported to X-ray 

## 2016-07-08 NOTE — ED Notes (Signed)
Patient transported to CT 

## 2016-07-28 ENCOUNTER — Encounter: Payer: Self-pay | Admitting: Cardiology

## 2016-07-28 ENCOUNTER — Ambulatory Visit (INDEPENDENT_AMBULATORY_CARE_PROVIDER_SITE_OTHER): Payer: Medicare Other | Admitting: Cardiology

## 2016-07-28 VITALS — BP 136/68 | HR 68 | Resp 16 | Ht 65.0 in | Wt 113.0 lb

## 2016-07-28 DIAGNOSIS — R001 Bradycardia, unspecified: Secondary | ICD-10-CM | POA: Diagnosis not present

## 2016-07-28 DIAGNOSIS — T50905A Adverse effect of unspecified drugs, medicaments and biological substances, initial encounter: Secondary | ICD-10-CM

## 2016-07-28 DIAGNOSIS — I10 Essential (primary) hypertension: Secondary | ICD-10-CM | POA: Diagnosis not present

## 2016-07-28 DIAGNOSIS — I5032 Chronic diastolic (congestive) heart failure: Secondary | ICD-10-CM

## 2016-07-28 LAB — BASIC METABOLIC PANEL
BUN/Creatinine Ratio: 14 (ref 12–28)
BUN: 15 mg/dL (ref 10–36)
CALCIUM: 9.3 mg/dL (ref 8.7–10.3)
CHLORIDE: 92 mmol/L — AB (ref 96–106)
CO2: 24 mmol/L (ref 18–29)
Creatinine, Ser: 1.07 mg/dL — ABNORMAL HIGH (ref 0.57–1.00)
GFR calc Af Amer: 53 mL/min/{1.73_m2} — ABNORMAL LOW (ref >59)
GFR, EST NON AFRICAN AMERICAN: 46 mL/min/{1.73_m2} — AB (ref >59)
GLUCOSE: 98 mg/dL (ref 65–99)
Potassium: 4.6 mmol/L (ref 3.5–5.2)
SODIUM: 130 mmol/L — AB (ref 134–144)

## 2016-07-28 NOTE — Patient Instructions (Signed)
Medication Instructions:  Your physician recommends that you continue on your current medications as directed. Please refer to the Current Medication list given to you today.   Labwork: TODAY: BMET  Testing/Procedures: None  Follow-Up: Your physician wants you to follow-up in: 6 months with Dr. Theodosia Blender assistant. You will receive a reminder letter in the mail two months in advance. If you don't receive a letter, please call our office to schedule the follow-up appointment.   Your physician wants you to follow-up in: 1 year with Dr. Radford Pax. You will receive a reminder letter in the mail two months in advance. If you don't receive a letter, please call our office to schedule the follow-up appointment.   Any Other Special Instructions Will Be Listed Below (If Applicable).     If you need a refill on your cardiac medications before your next appointment, please call your pharmacy.

## 2016-07-28 NOTE — Progress Notes (Signed)
Cardiology Office Note    Date:  07/28/2016   ID:  JERLYN PAIN, DOB 09-18-1923, MRN [id number]  PCP:  [name], MD  Cardiologist:  Fransico Him, MD   Chief Complaint  Patient presents with  . Congestive Heart Failure  . Hypertension    History of Present Illness:  Brenda Lynch is a 83 y.o. female with a history of HTN, diastolic dysfunction with chronic diastolic CHF, chronic SOB secondary to chronic diastolic CHF/sedentary lifestyle and anxiety, chronically abnormal EKG with LVH and repolarization.  She presents today for followup and is doing well. She denies any chest pain or pressure, LE edema, dizziness, palpitations or syncope.She occasionally has some DOE when she gets anxious but she says that it occurs very rarely.   Past Medical History:  Diagnosis Date  . Allergic rhinitis   . Chronic diastolic CHF (congestive heart failure) ([place])   . Coronary artery disease   . Diabetes mellitus   . Diastolic dysfunction   . Diverticulosis   . Drug-induced bradycardia 01/28/2016  . Hypercholesterolemia   . Hypertension   . Meningioma ([place])   . Osteoarthritis   . Spinal stenosis   . Vertigo, benign positional     Past Surgical History:  Procedure Laterality Date  . BACK SURGERY    . BREAST BIOPSY    . TONSILLECTOMY      Current Medications: Current Meds  Medication Sig  . acetaminophen (TYLENOL) 500 MG tablet Take 500 mg by mouth every 6 (six) hours as needed for headache (pain).  Marland Kitchen aspirin EC 81 MG tablet Take 81 mg by mouth daily.  Marland Kitchen atorvastatin (LIPITOR) 20 MG tablet Take 20 mg by mouth daily.  . calcium carbonate (TUMS - DOSED IN MG ELEMENTAL CALCIUM) 500 MG chewable tablet Chew 1 tablet by mouth daily as needed for indigestion or heartburn.   . calcium citrate-vitamin D (CITRACAL+D) 315-200 MG-UNIT per tablet Take 1 tablet by mouth 2 (two) times daily.  . cetirizine (ZYRTEC) 10 MG tablet Take 10 mg by mouth daily.  . cholecalciferol (VITAMIN D) 1000  units tablet Take 1,000 Units by mouth daily.  Marland Kitchen estradiol (ESTRACE) 0.1 MG/GM vaginal cream Place 1 Applicatorful vaginally 2 (two) times a week. Monday and Friday  . ferrous fumarate (HEMOCYTE - 106 MG FE) 325 (106 FE) MG TABS tablet Take 1 tablet by mouth as needed.   . hydrochlorothiazide (HYDRODIURIL) 25 MG tablet Take 25 mg by mouth daily.  . hydrocortisone (ANUSOL-HC) 25 MG suppository Place 25 mg rectally 2 (two) times daily as needed for hemorrhoids or itching.  . indomethacin (INDOCIN) 50 MG capsule Take 50 mg by mouth 2 (two) times daily as needed (pain). Take with food or milk  . Lancets (ONETOUCH ULTRASOFT) lancets   . metFORMIN (GLUCOPHAGE) 500 MG tablet Take 500 mg by mouth 2 (two) times daily with a meal.   . metoprolol succinate (TOPROL-XL) 25 MG 24 hr tablet Take 1 tablet (25 mg total) by mouth daily.  . Misc Natural Products (OSTEO BI-FLEX ADV DOUBLE ST PO) Take 1 tablet by mouth daily as needed (knee pain).   . montelukast (SINGULAIR) 10 MG tablet Take 10 mg by mouth at bedtime.   Marland Kitchen omeprazole (PRILOSEC OTC) 20 MG tablet Take 20 mg by mouth daily.  . ONE TOUCH ULTRA TEST test strip   . potassium chloride (MICRO-K) 10 MEQ CR capsule Take 10 mEq by mouth daily.  . ramipril (ALTACE) 5 MG capsule Take 1 capsule (5  mg total) by mouth daily.  Marland Kitchen senna (SENOKOT) 8.6 MG TABS tablet Take 2 tablets by mouth at bedtime as needed for mild constipation.  . triamcinolone (NASACORT) 55 MCG/ACT AERO nasal inhaler Place 2 sprays into the nose daily.    Allergies:   Ciprofloxacin and Penicillins   Social History   Social History  . Marital status: Married    Spouse name: N/A  . Number of children: N/A  . Years of education: N/A   Social History Main Topics  . Smoking status: Former Smoker    Quit date: 01/30/1979  . Smokeless tobacco: Never Used  . Alcohol use No  . Drug use: No  . Sexual activity: Not Asked   Other Topics Concern  . None   Social History Narrative  . None       Family History:  The patient's family history includes Heart attack in her mother; Heart disease in her mother; Lung cancer in her brother; Pancreatic cancer in her brother; Prostate cancer in her father.   ROS:   Please see the history of present illness.    Review of Systems  HENT: Positive for hearing loss.   Musculoskeletal: Positive for muscle cramps.  Neurological: Positive for dizziness.   All other systems reviewed and are negative.  No flowsheet data found.     PHYSICAL EXAM:   VS:  BP 136/68   Pulse 68   Resp 16   Ht 5\' 5"  (1.651 m)   Wt 113 lb (51.3 kg)   SpO2 98%   BMI 18.80 kg/m    GEN: Well nourished, well developed, in no acute distress  HEENT: normal  Neck: no JVD, carotid bruits, or masses Cardiac: RRR; no murmurs, rubs, or gallops,no edema.  Intact distal pulses bilaterally.  Respiratory:  clear to auscultation bilaterally, normal work of breathing GI: soft, nontender, nondistended, + BS MS: no deformity or atrophy  Skin: warm and dry, no rash Neuro:  Alert and Oriented x 3, Strength and sensation are intact Psych: euthymic mood, full affect  Wt Readings from Last 3 Encounters:  07/28/16 113 lb (51.3 kg)  01/28/16 108 lb 12.8 oz (49.4 kg)  02/09/15 118 lb (53.5 kg)      Studies/Labs Reviewed:   EKG:  EKG is not ordered today.    Recent Labs: 01/28/2016: BUN 18; Creat 0.95; Potassium 4.4; Sodium 133   Lipid Panel No results found for: CHOL, TRIG, HDL, CHOLHDL, VLDL, LDLCALC, LDLDIRECT  Additional studies/ records that were reviewed today include:  none    ASSESSMENT:    1. Chronic diastolic CHF (congestive heart failure) ([place])   2. Essential hypertension   3. Drug-induced bradycardia      PLAN:  In order of problems listed above:  1. Chronic diastolic CHF - she appears euvolemic on exam.  Weight is up a little from the fall (108>>113lbs) but does not have evidence of volume overload on exam.  Continue diuretic.  Check  BMET. 2. HTN - BP controlled on current meds. Continue BB, diuretic and ACE I. 3. Drug induced bradycardia - resolved on lower dose of BB      Medication Adjustments/Labs and Tests Ordered: Current medicines are reviewed at length with the patient today.  Concerns regarding medicines are outlined above.  Medication changes, Labs and Tests ordered today are listed in the Patient Instructions below.  There are no Patient Instructions on file for this visit.   Signed, Fransico Him, MD  07/28/2016 9:57 AM  [facility] [address], [place], [place]  [address] Phone: [phone]; Fax: [phone]

## 2016-08-02 ENCOUNTER — Telehealth: Payer: Self-pay

## 2016-08-02 NOTE — Telephone Encounter (Signed)
Instructed patient to STOP HCTZ. Scheduled patient 6/1 in the HTN Clinic for BP check.  She agrees with treatment plan.

## 2016-08-02 NOTE — Telephone Encounter (Signed)
-----   Message from Erskine Emery, [facility] sent at 08/02/2016  3:33 PM EDT ----- Recommend stop HCTZ. If HR appropriate could increase BB for BP control. Ramipril could also be increased, but this is rarely associated with hyponatremia as well. Schedule in HTN clinic in 2 weeks

## 2016-08-04 ENCOUNTER — Telehealth: Payer: Self-pay | Admitting: Cardiology

## 2016-08-04 NOTE — Telephone Encounter (Signed)
Called patient about her message. Patient stated she was told to stop taking HCTZ, and now she feels dizzy. Informed patient that this is not a medication to help with dizziness. Informed patient that this medication helps get fluid off, that it is a diuretic that helps lower her blood pressure.

## 2016-08-04 NOTE — Telephone Encounter (Signed)
Patient's BP  Is 142/83 and HR 62. Informed patient's son that he does not need to take his mother to ED. Encouraged patient's son to call patient's PCP about her dizziness. Patient's son verbalized understanding and will call PCP.

## 2016-08-04 NOTE — Telephone Encounter (Signed)
New message   Pt is calling because she states that she was taken off all her medication for dizziness. She said she is very dizzy and her head is not steady. She is asking for a call back from RN.

## 2016-08-04 NOTE — Telephone Encounter (Signed)
Follow up    Pt son is calling about pt message to RN.  Pt c/o BP issue: STAT if pt c/o blurred vision, one-sided weakness or slurred speech  1. What are your last 5 BP readings? 142/83  2. Are you having any other symptoms (ex. Dizziness, headache, blurred vision, passed out)? dizziness  3. What is your BP issue? Per son pt bp is high. He is asking if pt should go to emergency room.

## 2016-08-18 ENCOUNTER — Ambulatory Visit (INDEPENDENT_AMBULATORY_CARE_PROVIDER_SITE_OTHER): Payer: Medicare Other | Admitting: Pharmacist

## 2016-08-18 VITALS — BP 136/62 | HR 70

## 2016-08-18 DIAGNOSIS — I10 Essential (primary) hypertension: Secondary | ICD-10-CM

## 2016-08-18 NOTE — Progress Notes (Signed)
Patient ID: Brenda Lynch                 DOB: January 15, 1924                      MRN: [id number]     HPI: Brenda Lynch is a 83 y.o. female referred by Dr. Radford Pax to HTN clinic. She was recently seen by Dr. Radford Pax on 07/28/16, and BP was controlled. However, her sodium was low at 130 on a Bmet from that visit, and therefore her HCTZ was discontinued on 08/02/16. PMH is significant for HTN, chronic diastolic CHF, CAD, HLD, and DM.   She is here today for a BP check and possible HTN medication titration, and presents in good spirits. She states that she has dizziness when changing positions (takes precautions when standing), and denies any falls. She uses her cane at home and has moved any objects from her usual walking paths that may result in falls, She denies headaches and blurry vision.   Current HTN meds:  Metoprolol succinate 25mg  daily Ramipril 5mg  daily   Previously tried: HCTZ - hyponatremia, Metoprolol succinate - HR in low 50s on 50mg  daily  BP goal: < 150/90  Family History: The patient's family history includes Heart attack in her mother; Heart disease in her mother; Lung cancer in her brother; Pancreatic cancer in her brother; Prostate cancer in her father.   Social History: Former smoker (quit date 01/1979), denies alcohol use, family helps with lawn care and other needs, active in church and community    Wt Readings from Last 3 Encounters:  07/28/16 113 lb (51.3 kg)  01/28/16 108 lb 12.8 oz (49.4 kg)  02/09/15 118 lb (53.5 kg)   BP Readings from Last 3 Encounters:  07/28/16 136/68  07/08/16 (!) 160/71  01/28/16 (!) 124/58   Pulse Readings from Last 3 Encounters:  07/28/16 68  07/08/16 66  01/28/16 (!) 52    Renal function: CrCl cannot be calculated (Patient's most recent lab result is older than the maximum 21 days allowed.).  Past Medical History:  Diagnosis Date  . Allergic rhinitis   . Chronic diastolic CHF (congestive heart failure) ([place])   . Coronary  artery disease   . Diabetes mellitus   . Diastolic dysfunction   . Diverticulosis   . Drug-induced bradycardia 01/28/2016  . Hypercholesterolemia   . Hypertension   . Meningioma ([place])   . Osteoarthritis   . Spinal stenosis   . Vertigo, benign positional     Current Outpatient Prescriptions on File Prior to Visit  Medication Sig Dispense Refill  . acetaminophen (TYLENOL) 500 MG tablet Take 500 mg by mouth every 6 (six) hours as needed for headache (pain).    Marland Kitchen aspirin EC 81 MG tablet Take 81 mg by mouth daily.    Marland Kitchen atorvastatin (LIPITOR) 20 MG tablet Take 20 mg by mouth daily.    . calcium carbonate (TUMS - DOSED IN MG ELEMENTAL CALCIUM) 500 MG chewable tablet Chew 1 tablet by mouth daily as needed for indigestion or heartburn.     . calcium citrate-vitamin D (CITRACAL+D) 315-200 MG-UNIT per tablet Take 1 tablet by mouth 2 (two) times daily.    . cetirizine (ZYRTEC) 10 MG tablet Take 10 mg by mouth daily.    . cholecalciferol (VITAMIN D) 1000 units tablet Take 1,000 Units by mouth daily.    Marland Kitchen estradiol (ESTRACE) 0.1 MG/GM vaginal cream Place 1 Applicatorful vaginally 2 (two) times a  week. Monday and Friday    . ferrous fumarate (HEMOCYTE - 106 MG FE) 325 (106 FE) MG TABS tablet Take 1 tablet by mouth as needed.     . hydrocortisone (ANUSOL-HC) 25 MG suppository Place 25 mg rectally 2 (two) times daily as needed for hemorrhoids or itching.    . indomethacin (INDOCIN) 50 MG capsule Take 50 mg by mouth 2 (two) times daily as needed (pain). Take with food or milk    . Lancets (ONETOUCH ULTRASOFT) lancets     . metFORMIN (GLUCOPHAGE) 500 MG tablet Take 500 mg by mouth 2 (two) times daily with a meal.     . metoprolol succinate (TOPROL-XL) 25 MG 24 hr tablet Take 1 tablet (25 mg total) by mouth daily. 30 tablet 11  . Misc Natural Products (OSTEO BI-FLEX ADV DOUBLE ST PO) Take 1 tablet by mouth daily as needed (knee pain).     . montelukast (SINGULAIR) 10 MG tablet Take 10 mg by mouth at  bedtime.     Marland Kitchen omeprazole (PRILOSEC OTC) 20 MG tablet Take 20 mg by mouth daily.    . ONE TOUCH ULTRA TEST test strip     . potassium chloride (MICRO-K) 10 MEQ CR capsule Take 10 mEq by mouth daily.    . ramipril (ALTACE) 5 MG capsule Take 1 capsule (5 mg total) by mouth daily. 30 capsule 11  . senna (SENOKOT) 8.6 MG TABS tablet Take 2 tablets by mouth at bedtime as needed for mild constipation.    . triamcinolone (NASACORT) 55 MCG/ACT AERO nasal inhaler Place 2 sprays into the nose daily.     No current facility-administered medications on file prior to visit.     Allergies  Allergen Reactions  . Ciprofloxacin     GI intolerance  . Penicillins Hives    Has patient had a PCN reaction causing immediate rash, facial/tongue/throat swelling, SOB or lightheadedness with hypotension: Yes Has patient had a PCN reaction causing severe rash involving mucus membranes or skin necrosis: No Has patient had a PCN reaction that required hospitalization No Has patient had a PCN reaction occurring within the last 10 years: No If all of the above answers are "NO", then may proceed with Cephalosporin use.     Assessment/Plan:  1. Hypertension - BP today is 136/62 at goal of < 150/90 after discontinuation of HCTZ on 08/02/16. Continue metoprolol succinate 25mg  daily and ramipril 5mg  daily. Will check BMET today to ensure that Na has increased after HCTZ d/c.  Follow up as needed.  Belia Heman, PharmD PGY1 Resident 08/18/2016 12:10 PM  Patient seen with: Fuller Canada, PharmD, CPP, [facility] [address] [place], [place] [address] Phone: [phone]; Fax: [phone]

## 2016-08-19 LAB — BASIC METABOLIC PANEL
BUN/Creatinine Ratio: 14 (ref 12–28)
BUN: 13 mg/dL (ref 10–36)
CALCIUM: 9.4 mg/dL (ref 8.7–10.3)
CO2: 22 mmol/L (ref 18–29)
Chloride: 96 mmol/L (ref 96–106)
Creatinine, Ser: 0.91 mg/dL (ref 0.57–1.00)
GFR calc Af Amer: 64 mL/min/{1.73_m2} (ref >59)
GFR, EST NON AFRICAN AMERICAN: 56 mL/min/{1.73_m2} — AB (ref >59)
Glucose: 109 mg/dL — ABNORMAL HIGH (ref 65–99)
POTASSIUM: 5 mmol/L (ref 3.5–5.2)
Sodium: 133 mmol/L — ABNORMAL LOW (ref 134–144)

## 2016-08-21 ENCOUNTER — Telehealth: Payer: Self-pay | Admitting: Cardiology

## 2016-08-21 NOTE — Telephone Encounter (Signed)
Patient understands not to take anymore Meclizine as she is already taking an antihistamine. She understands to ambulate with caution for a the time being. She was grateful for call and agrees with treatment plan.

## 2016-08-21 NOTE — Telephone Encounter (Signed)
No contraindications with other meds although pt is already taking another antihistamine Zyrtec. Main adverse effects would be excessive drowsiness / concern for falls in the elderly with antihistamines. Would caution pt to ambulate carefully, otherwise no major concerns.

## 2016-08-21 NOTE — Telephone Encounter (Signed)
New message       Pt request to talk to a nurse.  Not sure what it is about.

## 2016-08-21 NOTE — Telephone Encounter (Signed)
Patient called to report that she thinks she accidentally took Meclizine for a couple days.  She does not have any symptoms but wants to make sure it doesn't interact with any of her medications. She understands she will be called if actions are necessary.  To [facility].

## 2016-08-22 ENCOUNTER — Telehealth: Payer: Self-pay | Admitting: Cardiology

## 2016-08-22 NOTE — Telephone Encounter (Signed)
Returned patient's call. She states she had a "confused spell" this morning that last a few minutes but she is fine now and has been all day. She had her blood pressure checked during the spell and it was "good." She thinks she just had a moment of confusion. She will call if she needs any further assistance.

## 2016-08-22 NOTE — Telephone Encounter (Signed)
New message      Pt c/o of dizziness.  She has been taken off all of her medications.  Pt does not remember anyone calling her yesterday.  Please call

## 2016-08-22 NOTE — Telephone Encounter (Signed)
Follow up      Calling to talk to a nurse.  Pt is dizzy and want to know what she can take for that since she has been taken off all of her medication

## 2016-08-23 ENCOUNTER — Telehealth: Payer: Self-pay | Admitting: Cardiology

## 2016-08-23 NOTE — Telephone Encounter (Signed)
Patient called to report she is having trouble driving because she "has to hold her head straight" to avoid feeling like she is going to "topple over because the room is spinning." She reports her BP is fine but has no readings to report.  She states she thinks she is taking her medications as directed, but is not with her medications to check. She denies CP, SOB, palpitations, swelling. The patient is a little confused - she could not remember speaking with Cardiology this week (she called yesterday and Monday).  Patient states she has an appointment scheduled with PCP at the end of the month.  Instructed the patient to call PCP and reschedule OV for sooner. Instructed her to avoid driving. She understands she will be called if HTN Clinic has further recommendations at this time.

## 2016-08-23 NOTE — Telephone Encounter (Signed)
Agree pt should schedule sooner follow up with her PCP. BP stable at check last week with systolic in the 540G, should not warrant BP medication changes. May still be seeing effects from the meclizine that pt took earlier this week - this may have caused confusion and additional dizziness.

## 2016-08-23 NOTE — Telephone Encounter (Signed)
New message    Pt is calling to find out if Dr. Radford Pax was going to give her any medicine to help with her dizziness. She states she is having problems walking. She said she walks with her head in the air because she stumbles.

## 2016-08-24 ENCOUNTER — Telehealth: Payer: Self-pay | Admitting: Cardiology

## 2016-08-24 NOTE — Telephone Encounter (Signed)
New message   Patient son calling   Pt C/o medication issue:  1. Name of Medication: diuretic prescribe by PCP Dr. Deforest Hoyles   2. How are you currently taking this medication (dosage and times per day)? Per son - in am    3. Are you having a reaction (difficulty breathing--STAT)? No   4. What is your medication issue? Complaining of dizziness, 141/81  This am 165/91. Son Josph Macho has some concern would like to discuss with Dr. Radford Pax or nurse

## 2016-08-24 NOTE — Telephone Encounter (Signed)
Spoke with Pt's son, Josph Macho (on [place]) who states his mother, the pt is experiencing dizziness and pt states "her head feels heavy". Josph Macho thinks pt's symptoms are allergy related and he wanted to get an appointment with the ENT. I told Josph Macho to call Dr. Noreene Filbert office (Pt's otolaryngologist) to make an appt for pt to be seen and evaluated for her symptoms. Purnell Shoemaker that with the weather changes and the pollen out in the air that it can cause symptoms of dizziness, nasal congestion and headaches. Josph Macho is  concerned with Pt's BP and stated today Pt's BP was 141/81. Looking back through office visits notes, pt visited Megan Supple, pharmacist on 6/1/8 and her BP was at goal as long as its <150/90. Relayed info to [place] who stated he was happy to know that. Josph Macho stated he would make Pt an appt with Dr. Erik Obey.

## 2016-08-25 ENCOUNTER — Ambulatory Visit: Payer: Medicare Other | Admitting: Cardiology

## 2016-08-30 ENCOUNTER — Telehealth: Payer: Self-pay | Admitting: Cardiology

## 2016-08-30 NOTE — Telephone Encounter (Signed)
Spoke with patient's son, Josph Macho, who states he is concerned about his mother's BP. He states today BP is 179/97 mmHg. He states patient is not drinking a lot of water and he has encouraged her to drink 8 8 ozglasses of water daily. He reports this measurement was taken this morning before medications. He states he also checks her blood sugar. I advised him to check blood sugar prior to food/medication, then check BP at least 30 minutes after eating and taking medications. I advised him to continue to monitor BP and to call back if readings are consistently > 150/90 (goal set by Fuller Canada, RPh in hypertension clinic) when he measures 30 minutes after medications. Josph Macho was very appreciative of the information and will call back with questions or concerns.

## 2016-08-30 NOTE — Telephone Encounter (Signed)
Pt c/o BP issue: STAT if pt c/o blurred vision, one-sided weakness or slurred speech  1. What are your last 5 BP readings?  179/91  2. Are you having any other symptoms (ex. Dizziness, headache, blurred vision, passed out)? No, she has been dizzy before this   3. What is your BP issue? high

## 2016-08-30 NOTE — Telephone Encounter (Signed)
Attempted to call patient, message states enter access code. No answer and no voice mail.

## 2016-09-14 DIAGNOSIS — H9113 Presbycusis, bilateral: Secondary | ICD-10-CM | POA: Insufficient documentation

## 2016-10-12 ENCOUNTER — Encounter: Payer: Self-pay | Admitting: Podiatry

## 2016-10-12 ENCOUNTER — Ambulatory Visit (INDEPENDENT_AMBULATORY_CARE_PROVIDER_SITE_OTHER): Payer: Medicare Other | Admitting: Podiatry

## 2016-10-12 DIAGNOSIS — B351 Tinea unguium: Secondary | ICD-10-CM | POA: Diagnosis not present

## 2016-10-12 DIAGNOSIS — E119 Type 2 diabetes mellitus without complications: Secondary | ICD-10-CM

## 2016-10-12 DIAGNOSIS — M79676 Pain in unspecified toe(s): Secondary | ICD-10-CM

## 2016-10-12 NOTE — Progress Notes (Addendum)
   Subjective:    Patient ID: Brenda Lynch, female    DOB: March 02, 1924, 83 y.o.   MRN: [id number]  HPI this patient presents the office with chief complaint of long thick painful nails.  She states the nails are thick and disfigured and are painful walking and wearing her shoes.  He says she is unable to self treat.  She says that her great toenails are especially thick and painful.  She says that she is diabetic and taking metformin.  She presents the office today for  preventative foot care services .    Review of Systems  Constitutional: Positive for unexpected weight change.  HENT: Positive for hearing loss and sore throat.   Eyes: Positive for visual disturbance.  Respiratory: Positive for shortness of breath.   Gastrointestinal: Positive for constipation and nausea.  Endocrine: Positive for cold intolerance.       Increased urination  Genitourinary: Positive for frequency.  Musculoskeletal: Positive for gait problem.       Joint pain,muscle pain  Neurological: Positive for dizziness, weakness and light-headedness.       Objective:   Physical Exam GENERAL APPEARANCE: Alert, conversant. Appropriately groomed. No acute distress.  VASCULAR: Pedal pulses are  palpable at  [facility] and PT bilateral.  Capillary refill time is immediate to all digits,  Normal temperature gradient.  Digital hair growth is present bilateral  NEUROLOGIC: sensation is normal to 5.07 monofilament at 5/5 sites bilateral.  Light touch is intact bilateral, Muscle strength normal.  MUSCULOSKELETAL: acceptable muscle strength, tone and stability bilateral.  Intrinsic muscluature intact bilateral.  Rectus appearance of foot and digits noted bilateral.  NAILS  Thick disfigured discolored nails both feet.  No evidence of bacterial infection or drainage DERMATOLOGIC: skin color, texture, and turgor are within normal limits.  No preulcerative lesions or ulcers  are seen, no interdigital maceration noted.  No open lesions  present.  . No drainage noted.         Assessment & Plan:  Onychomycosis   Diabetes with no complications.  IE  Debridement and grinding of long thick painful nails.  Return to the clinic in 3 months for further evaluation and treatment   Gardiner Barefoot DPM

## 2017-01-02 ENCOUNTER — Encounter: Payer: Self-pay | Admitting: Physician Assistant

## 2017-01-02 NOTE — Progress Notes (Signed)
Cardiology Office Note    Date:  01/04/2017  ID:  Brenda Lynch, DOB 10/12/1923, MRN [id number] PCP:  [name], MD  Cardiologist:  Radford Pax   Chief Complaint: f/u CHF  History of Present Illness:  Brenda Lynch is a 83 y.o. female with history of chronic diastolic CHF, likely CKD stage II based on Cr/age, chronic appearing hyponatremia, HTN, allergic rhinitis, hyperlipidemia, drug-induced bradycardia (improved with reduction in BB), chronic SOB due to chronic dCHF/sedentary lifestyle/anxiety, chronically abnormal EKG with LVH/repol, BPPV who presents for 6 month follow-up. CAD was listed in her PMH remotely but there is no prior evidence of this that I can find. Remote echo 04/2012 showed mild asymmetric septal hypertrophy, EF 78.4%, mild MR/TR, grade 1 DD, mild PR. Nuclear stress test 01/2015 was normal. Last labs 08/2016 showed glucose 109, K 5.0, Na 133 (appears chronic), BUN 13, Cr 0.91 with GFR 64.   Brenda Lynch is quite active for her age. She lives alone and does her own housework. Her sons live a block away. She denies chest discomfort or dyspnea with activity. She has occ dyspnea when she gets excited that is long standing. She has rare dizziness, has had no falls. Her sons check on her daily to ensure that she is drinking enough water. She denies orthopnea, PND, weight changes or edema.     Past Medical History:  Diagnosis Date  . Allergic rhinitis   . Chronic diastolic CHF (congestive heart failure) ([place])   . CKD (chronic kidney disease), stage II   . Coronary artery disease   . Diabetes mellitus   . Diastolic dysfunction   . Diverticulosis   . Drug-induced bradycardia 01/28/2016  . Hypercholesterolemia   . Hypertension   . Hyponatremia   . Meningioma ([place])   . Osteoarthritis   . Sedentary lifestyle   . Spinal stenosis   . Vertigo, benign positional     Past Surgical History:  Procedure Laterality Date  . BACK SURGERY    . BREAST BIOPSY    . TONSILLECTOMY       Current Medications: Current Meds  Medication Sig  . acetaminophen (TYLENOL) 500 MG tablet Take 500 mg by mouth every 6 (six) hours as needed for headache (pain).  Marland Kitchen aspirin EC 81 MG tablet Take 81 mg by mouth daily.  Marland Kitchen atorvastatin (LIPITOR) 20 MG tablet Take 20 mg by mouth daily.  . calcium carbonate (TUMS - DOSED IN MG ELEMENTAL CALCIUM) 500 MG chewable tablet Chew 1 tablet by mouth daily as needed for indigestion or heartburn.   . calcium citrate-vitamin D (CITRACAL+D) 315-200 MG-UNIT per tablet Take 1 tablet by mouth 2 (two) times daily.  . cetirizine (ZYRTEC) 10 MG tablet Take 10 mg by mouth daily.  . cholecalciferol (VITAMIN D) 1000 units tablet Take 1,000 Units by mouth daily.  Marland Kitchen estradiol (ESTRACE) 0.1 MG/GM vaginal cream Place 1 Applicatorful vaginally 2 (two) times a week. Monday and Friday  . ferrous fumarate (HEMOCYTE - 106 MG FE) 325 (106 FE) MG TABS tablet Take 1 tablet by mouth as needed.   . hydrocortisone (ANUSOL-HC) 25 MG suppository Place 25 mg rectally 2 (two) times daily as needed for hemorrhoids or itching.  . indomethacin (INDOCIN) 50 MG capsule Take 50 mg by mouth 2 (two) times daily as needed (pain). Take with food or milk  . Lancets (ONETOUCH ULTRASOFT) lancets   . metFORMIN (GLUCOPHAGE) 500 MG tablet Take 500 mg by mouth 2 (two) times daily with a meal.   .  metoprolol succinate (TOPROL-XL) 25 MG 24 hr tablet Take 1 tablet (25 mg total) by mouth daily.  . Misc Natural Products (OSTEO BI-FLEX ADV DOUBLE ST PO) Take 1 tablet by mouth daily as needed (knee pain).   . montelukast (SINGULAIR) 10 MG tablet Take 10 mg by mouth at bedtime.   Marland Kitchen omeprazole (PRILOSEC OTC) 20 MG tablet Take 20 mg by mouth daily.  . ONE TOUCH ULTRA TEST test strip 1 each by Other route as directed.   . potassium chloride (MICRO-K) 10 MEQ CR capsule Take 10 mEq by mouth daily.  . ramipril (ALTACE) 5 MG capsule Take 1 capsule (5 mg total) by mouth daily.  Marland Kitchen senna (SENOKOT) 8.6 MG TABS tablet  Take 2 tablets by mouth at bedtime as needed for mild constipation.  . triamcinolone (NASACORT) 55 MCG/ACT AERO nasal inhaler Place 2 sprays into the nose daily.  . [DISCONTINUED] metoprolol succinate (TOPROL-XL) 25 MG 24 hr tablet Take 1 tablet (25 mg total) by mouth daily.     Allergies:   Ciprofloxacin and Penicillins   Social History   Social History  . Marital status: Married    Spouse name: N/A  . Number of children: N/A  . Years of education: N/A   Social History Main Topics  . Smoking status: Former Smoker    Quit date: 01/30/1979  . Smokeless tobacco: Never Used  . Alcohol use No  . Drug use: No  . Sexual activity: Not Asked   Other Topics Concern  . None   Social History Narrative  . None     Family History:  Family History  Problem Relation Age of Onset  . Heart disease Mother   . Heart attack Mother   . Prostate cancer Father   . Lung cancer Brother   . Pancreatic cancer Brother      ROS:   Please see the history of present illness.  All other systems are reviewed and otherwise negative.    PHYSICAL EXAM:   VS:  BP 130/80   Pulse (!) 54   Ht 5\' 5"  (1.651 m)   Wt 112 lb 1.9 oz (50.9 kg)   BMI 18.66 kg/m   BMI: Body mass index is 18.66 kg/m. GEN: Well nourished, well developed, elderly female in no acute distress  HEENT: normocephalic, atraumatic Neck: no JVD, carotid bruits, or masses Cardiac: RRR; no murmurs, rubs, or gallops, no edema  Respiratory:  clear to auscultation bilaterally, normal work of breathing GI: soft, nontender, nondistended, + BS MS: no deformity or atrophy  Skin: warm and dry, no rash Neuro:  Alert and Oriented x 3, Strength and sensation are intact, follows commands Psych: euthymic mood, full affect  Wt Readings from Last 3 Encounters:  01/04/17 112 lb 1.9 oz (50.9 kg)  07/28/16 113 lb (51.3 kg)  01/28/16 108 lb 12.8 oz (49.4 kg)      Studies/Labs Reviewed:   EKG:  EKG was ordered today and personally reviewed  by me and demonstrates sinus bradycardia, 54 bpm, chronic repolarization abnormality.  Recent Labs: 08/18/2016: BUN 13; Creatinine, Ser 0.91; Potassium 5.0; Sodium 133   Lipid Panel No results found for: CHOL, TRIG, HDL, CHOLHDL, VLDL, LDLCALC, LDLDIRECT  Additional studies/ records that were reviewed today include: Summarized above.    ASSESSMENT & PLAN:   1. Chronic diastolic CHF: No heart failure type symptoms. Pt appears euvolemic. Pt apparently has been taking her Toprol twice a day for the previous 2-3 weeks due to a mix up  between the pharmacy and her PCP. She feels well, but with her prior drug induced bradyacardia and HR int he 50's I will send in proper rx for Toprol XL 25 mg once daily. I have discussed this with the patient and her son and they understand. They will contact us if her BP begins to run high >150/90.   2. Hypertension: Currently well controlled. Continue current meds.   3. CKD II: Labs in 08/2016 show SCr 0.91, K+ 5.0, stable.   4. Hyperlipidemia: On atorvastatin 20 mg. Followed by Dr. Lysle Rubens.    Disposition: F/u with Dr. Radford Pax with her pre-arranged follow up in May.    Medication Adjustments/Labs and Tests Ordered: Current medicines are reviewed at length with the patient today.  Concerns regarding medicines are outlined above. Medication changes, Labs and Tests ordered today are summarized above and listed in the Patient Instructions accessible in Encounters.   Signed, Daune Perch, NP  01/04/2017 8:52 AM    [address], [place], [place]  [address] Phone: [phone]; Fax: [phone]

## 2017-01-04 ENCOUNTER — Ambulatory Visit (INDEPENDENT_AMBULATORY_CARE_PROVIDER_SITE_OTHER): Payer: Medicare Other | Admitting: Cardiology

## 2017-01-04 ENCOUNTER — Encounter: Payer: Self-pay | Admitting: Cardiology

## 2017-01-04 VITALS — BP 130/80 | HR 54 | Ht 65.0 in | Wt 112.1 lb

## 2017-01-04 DIAGNOSIS — R001 Bradycardia, unspecified: Secondary | ICD-10-CM

## 2017-01-04 DIAGNOSIS — E7800 Pure hypercholesterolemia, unspecified: Secondary | ICD-10-CM | POA: Diagnosis not present

## 2017-01-04 DIAGNOSIS — I5032 Chronic diastolic (congestive) heart failure: Secondary | ICD-10-CM | POA: Diagnosis not present

## 2017-01-04 DIAGNOSIS — T50905A Adverse effect of unspecified drugs, medicaments and biological substances, initial encounter: Secondary | ICD-10-CM

## 2017-01-04 DIAGNOSIS — I10 Essential (primary) hypertension: Secondary | ICD-10-CM

## 2017-01-04 DIAGNOSIS — N182 Chronic kidney disease, stage 2 (mild): Secondary | ICD-10-CM

## 2017-01-04 MED ORDER — METOPROLOL SUCCINATE ER 25 MG PO TB24: 25.0000 mg | ORAL_TABLET | Freq: Every day | ORAL | 11 refills | Status: DC

## 2017-01-04 NOTE — Patient Instructions (Addendum)
Medication Instructions: Your physician recommends that you continue on your current medications as directed. Please refer to the Current Medication list given to you today.  Labwork: None Ordered  Procedures/Testing: None Ordered  Follow-Up: Your physician wants you to follow-up in: 6 MONTHS with Dr. Radford Pax. You will receive a reminder letter in the mail two months in advance. If you don't receive a letter, please call our office to schedule the follow-up appointment.  If you need a refill on your cardiac medications before your next appointment, please call your pharmacy. \

## 2017-01-10 ENCOUNTER — Ambulatory Visit: Payer: Medicare Other | Admitting: Podiatry

## 2017-03-28 NOTE — Progress Notes (Signed)
Cardiology Office Note:    Date:  03/29/2017   ID:  Brenda Lynch, DOB 1923-11-22, MRN [id number]  PCP:  Brenda Low, MD  Cardiologist:  No primary care provider on file.    Referring MD: Brenda Low, MD   Chief Complaint  Patient presents with  . BP concerns    per pcp  . Hypertension    History of Present Illness:    Brenda Lynch is a 84 y.o. female with a hx of with a history of HTN, diastolic dysfunction with chronic diastolic CHF, chronic SOB secondary to chronic diastolic CHF/sedentary lifestyle and anxiety, chronically abnormal EKG with LVH and repolarization  She is here today for followup and is doing well.  Her son is here today and states that she has had problems with her BP being elevated recently.  Her SBP has been as high as the 170's.  She denies any chest pain or pressure, SOB, DOE, PND, orthopnea, LE edema, dizziness, palpitations or syncope. She is compliant with her meds and is tolerating meds with no SE.    Past Medical History:  Diagnosis Date  . Allergic rhinitis   . Chronic diastolic CHF (congestive heart failure) ([place])   . CKD (chronic kidney disease), stage II   . Coronary artery disease   . Diabetes mellitus   . Diastolic dysfunction   . Diverticulosis   . Drug-induced bradycardia 01/28/2016  . Hypercholesterolemia   . Hypertension   . Hyponatremia   . Meningioma ([place])   . Osteoarthritis   . Sedentary lifestyle   . Spinal stenosis   . Vertigo, benign positional     Past Surgical History:  Procedure Laterality Date  . BACK SURGERY    . BREAST BIOPSY    . TONSILLECTOMY      Current Medications: Current Meds  Medication Sig  . acetaminophen (TYLENOL) 500 MG tablet Take 500 mg by mouth every 6 (six) hours as needed for headache (pain).  Marland Kitchen aspirin EC 81 MG tablet Take 81 mg by mouth daily.  Marland Kitchen atorvastatin (LIPITOR) 20 MG tablet Take 20 mg by mouth daily.  . calcium carbonate (TUMS - DOSED IN MG ELEMENTAL CALCIUM) 500 MG chewable  tablet Chew 1 tablet by mouth daily as needed for indigestion or heartburn.   . calcium citrate-vitamin D (CITRACAL+D) 315-200 MG-UNIT per tablet Take 1 tablet by mouth 2 (two) times daily.  . cetirizine (ZYRTEC) 10 MG tablet Take 10 mg by mouth daily.  . cholecalciferol (VITAMIN D) 1000 units tablet Take 1,000 Units by mouth daily.  Marland Kitchen estradiol (ESTRACE) 0.1 MG/GM vaginal cream Place 1 Applicatorful vaginally 2 (two) times a week. Monday and Friday  . ferrous fumarate (HEMOCYTE - 106 MG FE) 325 (106 FE) MG TABS tablet Take 1 tablet by mouth as needed (iron).   . hydrocortisone (ANUSOL-HC) 25 MG suppository Place 25 mg rectally 2 (two) times daily as needed for hemorrhoids or itching.  . indomethacin (INDOCIN) 50 MG capsule Take 50 mg by mouth 2 (two) times daily as needed (pain). Take with food or milk  . Lancets (ONETOUCH ULTRASOFT) lancets 1 each by Other route as directed.   . metFORMIN (GLUCOPHAGE) 500 MG tablet Take 500 mg by mouth daily with breakfast.   . metoprolol succinate (TOPROL-XL) 25 MG 24 hr tablet Take 1 tablet (25 mg total) by mouth daily.  . Misc Natural Products (OSTEO BI-FLEX ADV DOUBLE ST PO) Take 1 tablet by mouth daily as needed (knee pain).   Marland Kitchen  montelukast (SINGULAIR) 10 MG tablet Take 10 mg by mouth at bedtime.   Marland Kitchen omeprazole (PRILOSEC OTC) 20 MG tablet Take 20 mg by mouth daily.  . ONE TOUCH ULTRA TEST test strip 1 each by Other route as directed.   . potassium chloride (MICRO-K) 10 MEQ CR capsule Take 10 mEq by mouth daily.  . ramipril (ALTACE) 5 MG capsule Take 1 capsule (5 mg total) by mouth daily.  Marland Kitchen senna (SENOKOT) 8.6 MG TABS tablet Take 2 tablets by mouth at bedtime as needed for mild constipation.  . triamcinolone (NASACORT) 55 MCG/ACT AERO nasal inhaler Place 2 sprays into the nose daily.     Allergies:   Ciprofloxacin and Penicillins   Social History   Socioeconomic History  . Marital status: Married    Spouse name: None  . Number of children: None  .  Years of education: None  . Highest education level: None  Social Needs  . Financial resource strain: None  . Food insecurity - worry: None  . Food insecurity - inability: None  . Transportation needs - medical: None  . Transportation needs - non-medical: None  Occupational History  . None  Tobacco Use  . Smoking status: Former Smoker    Last attempt to quit: 01/30/1979    Years since quitting: 38.1  . Smokeless tobacco: Never Used  Substance and Sexual Activity  . Alcohol use: No  . Drug use: No  . Sexual activity: None  Other Topics Concern  . None  Social History Narrative  . None     Family History: The patient's family history includes Heart attack in her mother; Heart disease in her mother; Lung cancer in her brother; Pancreatic cancer in her brother; Prostate cancer in her father.  ROS:   Please see the history of present illness.    Review of Systems  Musculoskeletal: Positive for back pain and muscle cramps.  Psychiatric/Behavioral: The patient is nervous/anxious.     All other systems reviewed and negative.   EKGs/Labs/Other Studies Reviewed:    The following studies were reviewed today: none  EKG:  EKG is not ordered today.    Recent Labs: 08/18/2016: BUN 13; Creatinine, Ser 0.91; Potassium 5.0; Sodium 133   Recent Lipid Panel No results found for: CHOL, TRIG, HDL, CHOLHDL, VLDL, LDLCALC, LDLDIRECT  Physical Exam:    VS:  BP (!) 178/68   Pulse (!) 59   Ht 5\' 6"  (1.676 m)   Wt 112 lb 12.8 oz (51.2 kg)   BMI 18.21 kg/m     Wt Readings from Last 3 Encounters:  03/29/17 112 lb 12.8 oz (51.2 kg)  01/04/17 112 lb 1.9 oz (50.9 kg)  07/28/16 113 lb (51.3 kg)     GEN:  Well nourished, well developed in no acute distress HEENT: Normal NECK: No JVD; No carotid bruits LYMPHATICS: No lymphadenopathy CARDIAC: RRR, no murmurs, rubs, gallops RESPIRATORY:  Clear to auscultation without rales, wheezing or rhonchi  ABDOMEN: Soft, non-tender,  non-distended MUSCULOSKELETAL:  No edema; No deformity  SKIN: Warm and dry NEUROLOGIC:  Alert and oriented x 3 PSYCHIATRIC:  Normal affect   ASSESSMENT:    1. Chronic diastolic CHF (congestive heart failure) ([place])   2. Essential hypertension    PLAN:    In order of problems listed above:  1.  Chronic diastolic CHF- She appears euvolemic on exam today.  Her weight is stable.  She is tolerating her meds well for HF.  Her chronic SOB is stable.  She will continue on Toprol XL 25mg  daily and Ramipril 5mg  daily.  I will check a BMET.  2.  HTN - BP is elevated on exam today.  She will continue on BB and ACE I and I am going to add HCTZ 12.5mg  daily and have her followup in HTN clinic in 1 week.  I discussed with her son that I think a good BP for her would be < 150/53mmHg.     Medication Adjustments/Labs and Tests Ordered: Current medicines are reviewed at length with the patient today.  Concerns regarding medicines are outlined above.  No orders of the defined types were placed in this encounter.  No orders of the defined types were placed in this encounter.   Signed, Fransico Him, MD  03/29/2017 8:12 AM    [place]

## 2017-03-29 ENCOUNTER — Encounter (INDEPENDENT_AMBULATORY_CARE_PROVIDER_SITE_OTHER): Payer: Self-pay

## 2017-03-29 ENCOUNTER — Encounter: Payer: Self-pay | Admitting: Cardiology

## 2017-03-29 ENCOUNTER — Ambulatory Visit: Payer: Medicare Other | Admitting: Cardiology

## 2017-03-29 VITALS — BP 178/68 | HR 59 | Ht 66.0 in | Wt 112.8 lb

## 2017-03-29 DIAGNOSIS — I5032 Chronic diastolic (congestive) heart failure: Secondary | ICD-10-CM | POA: Diagnosis not present

## 2017-03-29 DIAGNOSIS — I10 Essential (primary) hypertension: Secondary | ICD-10-CM | POA: Diagnosis not present

## 2017-03-29 MED ORDER — HYDROCHLOROTHIAZIDE 12.5 MG PO CAPS: 12.5000 mg | ORAL_CAPSULE | Freq: Every day | ORAL | 3 refills | Status: DC

## 2017-03-29 NOTE — Patient Instructions (Addendum)
Medication Instructions:  Your physician has recommended you make the following change in your medication:  1.  START Hydrochlorothiazide 12.5 mg taking 1 tablet daily  Labwork: 1 WEEK:  BMET  Testing/Procedures: None ordered  Follow-Up: Your physician recommends that you schedule a follow-up appointment in: [place] physician wants you to follow-up in: [place] [address]. Mallie Snooks will receive a reminder letter in the mail two months in advance. If you don't receive a letter, please call our office to schedule the follow-up appointment.    Any Other Special Instructions Will Be Listed Below (If Applicable).    If you need a refill on your cardiac medications before your next appointment, please call your pharmacy.

## 2017-04-10 ENCOUNTER — Ambulatory Visit (INDEPENDENT_AMBULATORY_CARE_PROVIDER_SITE_OTHER): Payer: Medicare Other | Admitting: Pharmacist

## 2017-04-10 ENCOUNTER — Other Ambulatory Visit: Payer: Medicare Other

## 2017-04-10 VITALS — BP 152/82 | HR 57

## 2017-04-10 DIAGNOSIS — I10 Essential (primary) hypertension: Secondary | ICD-10-CM | POA: Diagnosis not present

## 2017-04-10 DIAGNOSIS — I5032 Chronic diastolic (congestive) heart failure: Secondary | ICD-10-CM

## 2017-04-10 LAB — BASIC METABOLIC PANEL
BUN / CREAT RATIO: 15 (ref 12–28)
BUN: 15 mg/dL (ref 10–36)
CO2: 28 mmol/L (ref 20–29)
CREATININE: 1.03 mg/dL — AB (ref 0.57–1.00)
Calcium: 10.2 mg/dL (ref 8.7–10.3)
Chloride: 91 mmol/L — ABNORMAL LOW (ref 96–106)
GFR, EST AFRICAN AMERICAN: 55 mL/min/{1.73_m2} — AB (ref >59)
GFR, EST NON AFRICAN AMERICAN: 48 mL/min/{1.73_m2} — AB (ref >59)
Glucose: 81 mg/dL (ref 65–99)
POTASSIUM: 4.7 mmol/L (ref 3.5–5.2)
SODIUM: 133 mmol/L — AB (ref 134–144)

## 2017-04-10 NOTE — Patient Instructions (Signed)
It was nice to see you today  Continue taking your medications  Your blood pressure goal is less than 150/90  Call clinic if you notice any symptoms of low or high blood pressure (like frequent dizziness or headaches)

## 2017-04-10 NOTE — Progress Notes (Signed)
Patient ID: Brenda Lynch                 DOB: 1923/05/01                      MRN: [id number]     HPI: Brenda Lynch is a 84 y.o. female referred by Dr. Radford Pax to HTN clinic. PMH is significant for HTN, chronic diastolic CHF, CAD, HLD, and DM. She was recently seen by Dr. Radford Pax on 03/29/17 and BP was elevated at 178/68, contrasting usually controlled BP readings. Her son reported that pt's BP has been more elevated recently with SBP as high as the 170s at home. She was started on HCTZ 12.5mg  daily and presents for follow up today.  Pt presents today in good spirits with her son. She reports tolerating HCTZ well. She has not checked her BP at home since starting HCTZ. Denies falls, uses her cane when she ambulates in public but does not use it at home. She wears shoes indoor that have a good grip on the bottom to help with stability. She is occasionally lightheaded when she stands up, however this remains unchanged since starting HCTZ. She knows to take her time when she stands up. She took her BP medications this morning.  Current HTN meds: Toprol 25mg  daily, ramipril 5mg  daily, HCTZ 12.5mg  daily Previously tried: HCTZ - hyponatremia, Metoprolol succinate - HR in low 50s on 50mg  daily BP goal: < 150/83mmHg due to age and occasional lightheadedness  Family History: The patient's family history includes Heart attack in her mother; Heart disease in her mother; Lung cancer in her brother; Pancreatic cancer in her brother; Prostate cancer in her father.  Social History: Former smoker (quit date 01/1979), denies alcohol use, family helps with lawn care and other needs, active in church and community   Diet: avoids greens (indigestion), likes ham, stew, steak, pork, beans, grits, and corn. Likes waffles on Sunday. Doesn't add salt to food. Drinks decaf coffee.  Wt Readings from Last 3 Encounters:  03/29/17 112 lb 12.8 oz (51.2 kg)  01/04/17 112 lb 1.9 oz (50.9 kg)  07/28/16 113 lb (51.3 kg)    BP Readings from Last 3 Encounters:  03/29/17 (!) 178/68  01/04/17 130/80  08/18/16 136/62   Pulse Readings from Last 3 Encounters:  03/29/17 (!) 59  01/04/17 (!) 54  08/18/16 70    Renal function: CrCl cannot be calculated (Patient's most recent lab result is older than the maximum 21 days allowed.).  Past Medical History:  Diagnosis Date  . Allergic rhinitis   . Chronic diastolic CHF (congestive heart failure) ([place])   . CKD (chronic kidney disease), stage II   . Coronary artery disease   . Diabetes mellitus   . Diastolic dysfunction   . Diverticulosis   . Drug-induced bradycardia 01/28/2016  . Hypercholesterolemia   . Hypertension   . Hyponatremia   . Meningioma ([place])   . Osteoarthritis   . Sedentary lifestyle   . Spinal stenosis   . Vertigo, benign positional     Current Outpatient Medications on File Prior to Visit  Medication Sig Dispense Refill  . acetaminophen (TYLENOL) 500 MG tablet Take 500 mg by mouth every 6 (six) hours as needed for headache (pain).    Marland Kitchen aspirin EC 81 MG tablet Take 81 mg by mouth daily.    Marland Kitchen atorvastatin (LIPITOR) 20 MG tablet Take 20 mg by mouth daily.    . calcium carbonate (TUMS -  DOSED IN MG ELEMENTAL CALCIUM) 500 MG chewable tablet Chew 1 tablet by mouth daily as needed for indigestion or heartburn.     . calcium citrate-vitamin D (CITRACAL+D) 315-200 MG-UNIT per tablet Take 1 tablet by mouth 2 (two) times daily.    . cetirizine (ZYRTEC) 10 MG tablet Take 10 mg by mouth daily.    . cholecalciferol (VITAMIN D) 1000 units tablet Take 1,000 Units by mouth daily.    Marland Kitchen estradiol (ESTRACE) 0.1 MG/GM vaginal cream Place 1 Applicatorful vaginally 2 (two) times a week. Monday and Friday    . ferrous fumarate (HEMOCYTE - 106 MG FE) 325 (106 FE) MG TABS tablet Take 1 tablet by mouth as needed (iron).     . hydrochlorothiazide (MICROZIDE) 12.5 MG capsule Take 1 capsule (12.5 mg total) by mouth daily. 90 capsule 3  . hydrocortisone (ANUSOL-HC)  25 MG suppository Place 25 mg rectally 2 (two) times daily as needed for hemorrhoids or itching.    . indomethacin (INDOCIN) 50 MG capsule Take 50 mg by mouth 2 (two) times daily as needed (pain). Take with food or milk    . Lancets (ONETOUCH ULTRASOFT) lancets 1 each by Other route as directed.     . metFORMIN (GLUCOPHAGE) 500 MG tablet Take 500 mg by mouth daily with breakfast.     . metoprolol succinate (TOPROL-XL) 25 MG 24 hr tablet Take 1 tablet (25 mg total) by mouth daily. 30 tablet 11  . Misc Natural Products (OSTEO BI-FLEX ADV DOUBLE ST PO) Take 1 tablet by mouth daily as needed (knee pain).     . montelukast (SINGULAIR) 10 MG tablet Take 10 mg by mouth at bedtime.     Marland Kitchen omeprazole (PRILOSEC OTC) 20 MG tablet Take 20 mg by mouth daily.    . ONE TOUCH ULTRA TEST test strip 1 each by Other route as directed.     . potassium chloride (MICRO-K) 10 MEQ CR capsule Take 10 mEq by mouth daily.    . ramipril (ALTACE) 5 MG capsule Take 1 capsule (5 mg total) by mouth daily. 30 capsule 11  . senna (SENOKOT) 8.6 MG TABS tablet Take 2 tablets by mouth at bedtime as needed for mild constipation.    . triamcinolone (NASACORT) 55 MCG/ACT AERO nasal inhaler Place 2 sprays into the nose daily.     No current facility-administered medications on file prior to visit.     Allergies  Allergen Reactions  . Ciprofloxacin     GI intolerance  . Penicillins Hives    Has patient had a PCN reaction causing immediate rash, facial/tongue/throat swelling, SOB or lightheadedness with hypotension: Yes Has patient had a PCN reaction causing severe rash involving mucus membranes or skin necrosis: No Has patient had a PCN reaction that required hospitalization No Has patient had a PCN reaction occurring within the last 10 years: No If all of the above answers are "NO", then may proceed with Cephalosporin use.     Assessment/Plan:  1. Hypertension - BP improved and at goal <150/47mmHg. Will continue ramipril 5mg   daily, Toprol 25mg  daily, and HCTZ 25mg  daily. Checking BMET today with recent addition of HCTZ - pt does tend to be hyponatremic so will need to monitor sodium. Pt counseled to call clinic with any recurrent dizziness. F/u in HTN clinic as needed.   Fran Mcree E. Lorain Keast, PharmD, CPP, [place] [address], [place], [place] [address] Phone: [phone]; Fax: [phone] 04/10/2017 10:26 AM

## 2017-05-01 ENCOUNTER — Ambulatory Visit
Admission: RE | Admit: 2017-05-01 | Discharge: 2017-05-01 | Disposition: A | Discharge status: Home / Self Care | Payer: Medicare Other | Source: Ambulatory Visit | Attending: Internal Medicine | Admitting: Internal Medicine

## 2017-05-01 ENCOUNTER — Other Ambulatory Visit: Payer: Self-pay | Admitting: Internal Medicine

## 2017-05-01 DIAGNOSIS — M25572 Pain in left ankle and joints of left foot: Secondary | ICD-10-CM

## 2017-10-05 ENCOUNTER — Encounter: Payer: Self-pay | Admitting: Cardiology

## 2017-10-05 ENCOUNTER — Ambulatory Visit: Payer: Medicare Other | Admitting: Cardiology

## 2017-10-05 VITALS — BP 122/52 | HR 53 | Ht 66.0 in | Wt 115.8 lb

## 2017-10-05 DIAGNOSIS — I10 Essential (primary) hypertension: Secondary | ICD-10-CM | POA: Diagnosis not present

## 2017-10-05 DIAGNOSIS — I5032 Chronic diastolic (congestive) heart failure: Secondary | ICD-10-CM

## 2017-10-05 DIAGNOSIS — E7800 Pure hypercholesterolemia, unspecified: Secondary | ICD-10-CM

## 2017-10-05 NOTE — Progress Notes (Signed)
Cardiology Office Note:    Date:  10/05/2017   ID:  Brenda Lynch, DOB 01-Jul-1923, MRN [id number]  PCP:  [name], MD  Cardiologist:  No primary care provider on file.    Referring MD: [name], MD   Chief Complaint  Patient presents with  . Congestive Heart Failure  . Hypertension    History of Present Illness:    Brenda Lynch is a 84 y.o. female with a hx of HTN, diastolic dysfunction with chronic diastolic CHF, chronic SOB secondary to chronic diastolic CHF/sedentary lifestyle and anxiety, chronically abnormal EKG with LVH and repolarization  She is here today for followup and is doing well.  She denies any chest pain or pressure, SOB, DOE, PND, orthopnea, LE edema, dizziness, palpitations or syncope. She is compliant with her meds and is tolerating meds with no SE.    Past Medical History:  Diagnosis Date  . Allergic rhinitis   . Chronic diastolic CHF (congestive heart failure) ([place])   . CKD (chronic kidney disease), stage II   . Coronary artery disease   . Diabetes mellitus   . Diastolic dysfunction   . Diverticulosis   . Drug-induced bradycardia 01/28/2016  . Hypercholesterolemia   . Hypertension   . Hyponatremia   . Meningioma ([place])   . Osteoarthritis   . Sedentary lifestyle   . Spinal stenosis   . Vertigo, benign positional     Past Surgical History:  Procedure Laterality Date  . BACK SURGERY    . BREAST BIOPSY    . TONSILLECTOMY      Current Medications: No outpatient medications have been marked as taking for the 10/05/17 encounter (Office Visit) with Sueanne Margarita, MD.     Allergies:   Ciprofloxacin; Penicillins; and Penicillin g   Social History   Socioeconomic History  . Marital status: Married    Spouse name: Not on file  . Number of children: Not on file  . Years of education: Not on file  . Highest education level: Not on file  Occupational History  . Not on file  Social Needs  . Financial resource strain: Not on file    . Food insecurity:    Worry: Not on file    Inability: Not on file  . Transportation needs:    Medical: Not on file    Non-medical: Not on file  Tobacco Use  . Smoking status: Former Smoker    Last attempt to quit: 01/30/1979    Years since quitting: 38.7  . Smokeless tobacco: Never Used  Substance and Sexual Activity  . Alcohol use: No  . Drug use: No  . Sexual activity: Not on file  Lifestyle  . Physical activity:    Days per week: Not on file    Minutes per session: Not on file  . Stress: Not on file  Relationships  . Social connections:    Talks on phone: Not on file    Gets together: Not on file    Attends religious service: Not on file    Active member of club or organization: Not on file    Attends meetings of clubs or organizations: Not on file    Relationship status: Not on file  Other Topics Concern  . Not on file  Social History Narrative  . Not on file     Family History: The patient's family history includes Heart attack in her mother; Heart disease in her mother; Lung cancer in her brother; Pancreatic cancer in  her brother; Prostate cancer in her father.  ROS:   Please see the history of present illness.    ROS  All other systems reviewed and negative.   EKGs/Labs/Other Studies Reviewed:    The following studies were reviewed today: None  EKG:  EKG is not ordered today.    Recent Labs: 04/10/2017: BUN 15; Creatinine, Ser 1.03; Potassium 4.7; Sodium 133   Recent Lipid Panel No results found for: CHOL, TRIG, HDL, CHOLHDL, VLDL, LDLCALC, LDLDIRECT  Physical Exam:    VS:  BP (!) 122/52 (BP Location: Left Arm, Patient Position: Sitting, Cuff Size: Normal)   Pulse (!) 53   Ht 5\' 6"  (1.676 m)   Wt 115 lb 12.8 oz (52.5 kg)   SpO2 98%   BMI 18.69 kg/m     Wt Readings from Last 3 Encounters:  10/05/17 115 lb 12.8 oz (52.5 kg)  03/29/17 112 lb 12.8 oz (51.2 kg)  01/04/17 112 lb 1.9 oz (50.9 kg)     GEN:  Well nourished, well developed in no  acute distress HEENT: Normal NECK: No JVD; No carotid bruits LYMPHATICS: No lymphadenopathy CARDIAC: RRR, no murmurs, rubs, gallops RESPIRATORY:  Clear to auscultation without rales, wheezing or rhonchi  ABDOMEN: Soft, non-tender, non-distended MUSCULOSKELETAL:  No edema; No deformity  SKIN: Warm and dry NEUROLOGIC:  Alert and oriented x 3 PSYCHIATRIC:  Normal affect   ASSESSMENT:    1. Chronic diastolic CHF (congestive heart failure) ([place])   2. Essential hypertension   3. Hypercholesterolemia    PLAN:    In order of problems listed above:  1.  Chronic diastolic CHF -she appears euvolemic on exam today.  Her weight is stable.  She will continue on HCTZ 12.5 mg daily.  2.  Hypertension -BP is well controlled on exam today.  She will continue on HCTZ 12.5 mg daily, Toprol XL 25 mg daily and Altace 5 mg daily.  Her creatinine was stable at 1.08 on 09/03/2017 and potassium 4.8.  3.  Hyperlipidemia -LDL was 125 on 09/03/2017.  She will continue on atorvastatin 20 mg daily.  This is followed by her PCP.   Medication Adjustments/Labs and Tests Ordered: Current medicines are reviewed at length with the patient today.  Concerns regarding medicines are outlined above.  No orders of the defined types were placed in this encounter.  No orders of the defined types were placed in this encounter.   Signed, Fransico Him, MD  10/05/2017 9:25 AM    [place]

## 2017-10-05 NOTE — Patient Instructions (Signed)

## 2017-10-29 ENCOUNTER — Other Ambulatory Visit: Payer: Self-pay | Admitting: Orthopedic Surgery

## 2017-11-01 ENCOUNTER — Other Ambulatory Visit: Payer: Self-pay

## 2017-11-01 ENCOUNTER — Encounter (HOSPITAL_BASED_OUTPATIENT_CLINIC_OR_DEPARTMENT_OTHER): Payer: Self-pay | Admitting: *Deleted

## 2017-11-01 ENCOUNTER — Telehealth: Payer: Self-pay | Admitting: Cardiology

## 2017-11-01 NOTE — Progress Notes (Signed)
Pt takes Aspirin daily, left message for Dr. Radford Pax to advise on ASA hold 5 days prior to surgery. Will call son back with plan

## 2017-11-01 NOTE — Telephone Encounter (Signed)
New Message      [facility] Pre-operative Risk Assessment    Request for surgical clearance:  1. What type of surgery is being performed? Right small finger ablation to the nail and skin graft   2. When is this surgery scheduled? 11/08/2017   3. What type of clearance is required (medical clearance vs. Pharmacy clearance to hold med vs. Both)? Pharmacy   4. Are there any medications that need to be held prior to surgery and how long? Aspirin 5 days prior to the procedure   5. Practice name and name of physician performing surgery? Dr. Fredna Dow  6. What is your office phone number [phone]    7.   What is your office fax number [phone]  8.   Anesthesia type (None, local, MAC, general) ? Mac    Brenda Lynch 11/01/2017, 12:45 PM  _________________________________________________________________   (provider comments below)

## 2017-11-02 ENCOUNTER — Encounter (HOSPITAL_BASED_OUTPATIENT_CLINIC_OR_DEPARTMENT_OTHER)
Admission: RE | Admit: 2017-11-02 | Discharge: 2017-11-02 | Disposition: A | Discharge status: Home / Self Care | Payer: Medicare Other | Source: Ambulatory Visit | Attending: Orthopedic Surgery | Admitting: Orthopedic Surgery

## 2017-11-02 DIAGNOSIS — I10 Essential (primary) hypertension: Secondary | ICD-10-CM | POA: Diagnosis not present

## 2017-11-02 DIAGNOSIS — E785 Hyperlipidemia, unspecified: Secondary | ICD-10-CM | POA: Diagnosis not present

## 2017-11-02 DIAGNOSIS — Z01812 Encounter for preprocedural laboratory examination: Secondary | ICD-10-CM | POA: Insufficient documentation

## 2017-11-02 LAB — BASIC METABOLIC PANEL
Anion gap: 9 (ref 5–15)
BUN: 12 mg/dL (ref 8–23)
CHLORIDE: 100 mmol/L (ref 98–111)
CO2: 27 mmol/L (ref 22–32)
CREATININE: 1.03 mg/dL — AB (ref 0.44–1.00)
Calcium: 9.1 mg/dL (ref 8.9–10.3)
GFR, EST AFRICAN AMERICAN: 53 mL/min — AB (ref >60)
GFR, EST NON AFRICAN AMERICAN: 46 mL/min — AB (ref >60)
Glucose, Bld: 100 mg/dL — ABNORMAL HIGH (ref 70–99)
POTASSIUM: 4.5 mmol/L (ref 3.5–5.1)
SODIUM: 136 mmol/L (ref 135–145)

## 2017-11-05 NOTE — Telephone Encounter (Signed)
Ok to hold ASA from cardiac standpoint

## 2017-11-06 NOTE — Telephone Encounter (Signed)
Routing to Dr. Leland Her, RN, [place] [address] [place], [place]  [address] [phone]

## 2017-11-07 NOTE — Progress Notes (Signed)
Left message for patient regarding Aspirin therapy.  Okay for her to stop her cardiology.

## 2017-11-08 ENCOUNTER — Ambulatory Visit (HOSPITAL_BASED_OUTPATIENT_CLINIC_OR_DEPARTMENT_OTHER): Payer: Medicare Other | Admitting: Anesthesiology

## 2017-11-08 ENCOUNTER — Ambulatory Visit (HOSPITAL_BASED_OUTPATIENT_CLINIC_OR_DEPARTMENT_OTHER)
Admission: RE | Admit: 2017-11-08 | Discharge: 2017-11-08 | Disposition: A | Discharge status: Home / Self Care | Payer: Medicare Other | Source: Ambulatory Visit | Attending: Orthopedic Surgery | Admitting: Orthopedic Surgery

## 2017-11-08 ENCOUNTER — Encounter (HOSPITAL_BASED_OUTPATIENT_CLINIC_OR_DEPARTMENT_OTHER): Payer: Self-pay | Admitting: *Deleted

## 2017-11-08 ENCOUNTER — Other Ambulatory Visit: Payer: Self-pay

## 2017-11-08 ENCOUNTER — Encounter (HOSPITAL_BASED_OUTPATIENT_CLINIC_OR_DEPARTMENT_OTHER)
Admission: RE | Disposition: A | Discharge status: Home / Self Care | Payer: Self-pay | Source: Ambulatory Visit | Attending: Orthopedic Surgery

## 2017-11-08 DIAGNOSIS — M1990 Unspecified osteoarthritis, unspecified site: Secondary | ICD-10-CM | POA: Insufficient documentation

## 2017-11-08 DIAGNOSIS — I2510 Atherosclerotic heart disease of native coronary artery without angina pectoris: Secondary | ICD-10-CM | POA: Insufficient documentation

## 2017-11-08 DIAGNOSIS — Z7984 Long term (current) use of oral hypoglycemic drugs: Secondary | ICD-10-CM | POA: Diagnosis not present

## 2017-11-08 DIAGNOSIS — I130 Hypertensive heart and chronic kidney disease with heart failure and stage 1 through stage 4 chronic kidney disease, or unspecified chronic kidney disease: Secondary | ICD-10-CM | POA: Diagnosis not present

## 2017-11-08 DIAGNOSIS — N182 Chronic kidney disease, stage 2 (mild): Secondary | ICD-10-CM | POA: Insufficient documentation

## 2017-11-08 DIAGNOSIS — E7800 Pure hypercholesterolemia, unspecified: Secondary | ICD-10-CM | POA: Diagnosis not present

## 2017-11-08 DIAGNOSIS — I5032 Chronic diastolic (congestive) heart failure: Secondary | ICD-10-CM | POA: Insufficient documentation

## 2017-11-08 DIAGNOSIS — Z79899 Other long term (current) drug therapy: Secondary | ICD-10-CM | POA: Diagnosis not present

## 2017-11-08 DIAGNOSIS — Z87891 Personal history of nicotine dependence: Secondary | ICD-10-CM | POA: Diagnosis not present

## 2017-11-08 DIAGNOSIS — L608 Other nail disorders: Secondary | ICD-10-CM | POA: Insufficient documentation

## 2017-11-08 DIAGNOSIS — E1122 Type 2 diabetes mellitus with diabetic chronic kidney disease: Secondary | ICD-10-CM | POA: Insufficient documentation

## 2017-11-08 DIAGNOSIS — Z7982 Long term (current) use of aspirin: Secondary | ICD-10-CM | POA: Insufficient documentation

## 2017-11-08 HISTORY — PX: SKIN FULL THICKNESS GRAFT: SHX442

## 2017-11-08 LAB — GLUCOSE, CAPILLARY
GLUCOSE-CAPILLARY: 108 mg/dL — AB (ref 70–99)
GLUCOSE-CAPILLARY: 154 mg/dL — AB (ref 70–99)

## 2017-11-08 SURGERY — APPLICATION, GRAFT, SKIN, FULL-THICKNESS
Anesthesia: General | Site: Hand | Laterality: Right

## 2017-11-08 MED ORDER — ONDANSETRON HCL 4 MG/2ML IJ SOLN
INTRAMUSCULAR | Status: AC
  Filled 2017-11-08: qty 2

## 2017-11-08 MED ORDER — VANCOMYCIN HCL IN DEXTROSE 1-5 GM/200ML-% IV SOLN
1000.0000 mg | INTRAVENOUS | Status: AC
  Administered 2017-11-08: 1000 mg via INTRAVENOUS

## 2017-11-08 MED ORDER — FENTANYL CITRATE (PF) 100 MCG/2ML IJ SOLN
INTRAMUSCULAR | Status: AC
  Filled 2017-11-08: qty 2

## 2017-11-08 MED ORDER — EPHEDRINE SULFATE 50 MG/ML IJ SOLN
INTRAMUSCULAR | Status: DC | PRN
  Administered 2017-11-08: 15 mg via INTRAVENOUS
  Administered 2017-11-08 (×2): 10 mg via INTRAVENOUS

## 2017-11-08 MED ORDER — HYDROCODONE-ACETAMINOPHEN 5-325 MG PO TABS: ORAL_TABLET | ORAL | 0 refills | Status: DC

## 2017-11-08 MED ORDER — DEXAMETHASONE SODIUM PHOSPHATE 10 MG/ML IJ SOLN
INTRAMUSCULAR | Status: DC | PRN
  Administered 2017-11-08: 10 mg via INTRAVENOUS

## 2017-11-08 MED ORDER — VANCOMYCIN HCL 1000 MG IV SOLR
INTRAVENOUS | Status: DC | PRN
  Administered 2017-11-08: 1000 mg via INTRAVENOUS

## 2017-11-08 MED ORDER — FENTANYL CITRATE (PF) 100 MCG/2ML IJ SOLN
50.0000 ug | INTRAMUSCULAR | Status: DC | PRN
  Administered 2017-11-08 (×2): 25 ug via INTRAVENOUS

## 2017-11-08 MED ORDER — SCOPOLAMINE 1 MG/3DAYS TD PT72: 1.0000 | MEDICATED_PATCH | Freq: Once | TRANSDERMAL | Status: DC | PRN

## 2017-11-08 MED ORDER — FENTANYL CITRATE (PF) 100 MCG/2ML IJ SOLN: 25.0000 ug | INTRAMUSCULAR | Status: DC | PRN

## 2017-11-08 MED ORDER — ONDANSETRON HCL 4 MG/2ML IJ SOLN
INTRAMUSCULAR | Status: DC | PRN
  Administered 2017-11-08: 4 mg via INTRAVENOUS

## 2017-11-08 MED ORDER — LIDOCAINE 2% (20 MG/ML) 5 ML SYRINGE
INTRAMUSCULAR | Status: AC
  Filled 2017-11-08: qty 5

## 2017-11-08 MED ORDER — PROPOFOL 10 MG/ML IV BOLUS
INTRAVENOUS | Status: AC
  Filled 2017-11-08: qty 40

## 2017-11-08 MED ORDER — DEXAMETHASONE SODIUM PHOSPHATE 10 MG/ML IJ SOLN
INTRAMUSCULAR | Status: AC
  Filled 2017-11-08: qty 1

## 2017-11-08 MED ORDER — LACTATED RINGERS IV SOLN
INTRAVENOUS | Status: DC
  Administered 2017-11-08: 09:00:00 via INTRAVENOUS

## 2017-11-08 MED ORDER — CHLORHEXIDINE GLUCONATE 4 % EX LIQD: 60.0000 mL | Freq: Once | CUTANEOUS | Status: DC

## 2017-11-08 MED ORDER — LIDOCAINE HCL (CARDIAC) PF 100 MG/5ML IV SOSY
PREFILLED_SYRINGE | INTRAVENOUS | Status: DC | PRN
  Administered 2017-11-08: 50 mg via INTRAVENOUS

## 2017-11-08 MED ORDER — MIDAZOLAM HCL 2 MG/2ML IJ SOLN: 1.0000 mg | INTRAMUSCULAR | Status: DC | PRN

## 2017-11-08 MED ORDER — VANCOMYCIN HCL IN DEXTROSE 1-5 GM/200ML-% IV SOLN
INTRAVENOUS | Status: AC
  Filled 2017-11-08: qty 200

## 2017-11-08 MED ORDER — PROPOFOL 10 MG/ML IV BOLUS
INTRAVENOUS | Status: DC | PRN
  Administered 2017-11-08: 70 mg via INTRAVENOUS

## 2017-11-08 SURGICAL SUPPLY — 47 items
BALL CTTN LRG ABS STRL LF (GAUZE/BANDAGES/DRESSINGS) ×1
BANDAGE ACE 3X5.8 VEL STRL LF (GAUZE/BANDAGES/DRESSINGS) IMPLANT
BANDAGE COBAN STERILE 2 (GAUZE/BANDAGES/DRESSINGS) IMPLANT
BLADE SURG 15 STRL LF DISP TIS (BLADE) ×2 IMPLANT
BLADE SURG 15 STRL SS (BLADE) ×4
BNDG CMPR 9X4 STRL LF SNTH (GAUZE/BANDAGES/DRESSINGS) ×1
BNDG COHESIVE 1X5 TAN STRL LF (GAUZE/BANDAGES/DRESSINGS) ×3 IMPLANT
BNDG ESMARK 4X9 LF (GAUZE/BANDAGES/DRESSINGS) ×2 IMPLANT
BNDG GAUZE ELAST 4 BULKY (GAUZE/BANDAGES/DRESSINGS) IMPLANT
CHLORAPREP W/TINT 26ML (MISCELLANEOUS) ×2 IMPLANT
CORD BIPOLAR FORCEPS 12FT (ELECTRODE) ×2 IMPLANT
COTTONBALL LRG STERILE PKG (GAUZE/BANDAGES/DRESSINGS) ×1 IMPLANT
COVER BACK TABLE 60X90IN (DRAPES) ×2 IMPLANT
COVER MAYO STAND STRL (DRAPES) ×2 IMPLANT
CUFF TOURNIQUET SINGLE 18IN (TOURNIQUET CUFF) ×1 IMPLANT
DEPRESSOR TONGUE BLADE STERILE (MISCELLANEOUS) IMPLANT
DRAPE EXTREMITY T 121X128X90 (DRAPE) ×2 IMPLANT
DRAPE SURG 17X23 STRL (DRAPES) ×2 IMPLANT
GAUZE SPONGE 4X4 12PLY STRL (GAUZE/BANDAGES/DRESSINGS) ×2 IMPLANT
GAUZE XEROFORM 1X8 LF (GAUZE/BANDAGES/DRESSINGS) ×2 IMPLANT
GLOVE BIO SURGEON STRL SZ7 (GLOVE) ×1 IMPLANT
GLOVE BIO SURGEON STRL SZ7.5 (GLOVE) ×2 IMPLANT
GLOVE BIOGEL PI IND STRL 8 (GLOVE) ×1 IMPLANT
GLOVE BIOGEL PI INDICATOR 8 (GLOVE) ×2
GOWN STRL REUS W/ TWL LRG LVL3 (GOWN DISPOSABLE) ×1 IMPLANT
GOWN STRL REUS W/ TWL XL LVL3 (GOWN DISPOSABLE) IMPLANT
GOWN STRL REUS W/TWL LRG LVL3 (GOWN DISPOSABLE)
GOWN STRL REUS W/TWL XL LVL3 (GOWN DISPOSABLE) ×4 IMPLANT
NDL HYPO 25X1 1.5 SAFETY (NEEDLE) IMPLANT
NEEDLE HYPO 25X1 1.5 SAFETY (NEEDLE) IMPLANT
NS IRRIG 1000ML POUR BTL (IV SOLUTION) ×2 IMPLANT
PACK BASIN DAY SURGERY FS (CUSTOM PROCEDURE TRAY) ×2 IMPLANT
PAD CAST 3X4 CTTN HI CHSV (CAST SUPPLIES) IMPLANT
PADDING CAST ABS 4INX4YD NS (CAST SUPPLIES) ×1
PADDING CAST ABS COTTON 4X4 ST (CAST SUPPLIES) ×1 IMPLANT
PADDING CAST COTTON 3X4 STRL (CAST SUPPLIES)
SPLINT PLASTER CAST XFAST 3X15 (CAST SUPPLIES) IMPLANT
SPLINT PLASTER XTRA FASTSET 3X (CAST SUPPLIES)
STOCKINETTE 4X48 STRL (DRAPES) ×2 IMPLANT
SUT CHROMIC 5 0 P 3 (SUTURE) ×2 IMPLANT
SUT ETHILON 4 0 PS 2 18 (SUTURE) ×1 IMPLANT
SUT ETHILON 5 0 P 3 18 (SUTURE) ×1
SUT NYLON ETHILON 5-0 P-3 1X18 (SUTURE) IMPLANT
SYR BULB 3OZ (MISCELLANEOUS) ×2 IMPLANT
SYR CONTROL 10ML LL (SYRINGE) IMPLANT
TOWEL GREEN STERILE FF (TOWEL DISPOSABLE) ×4 IMPLANT
UNDERPAD 30X30 (UNDERPADS AND DIAPERS) ×1 IMPLANT

## 2017-11-08 NOTE — Anesthesia Postprocedure Evaluation (Addendum)
Anesthesia Post Note  Patient: Nida B Neisen  Procedure(s) Performed: RIGHT SMALL FINGER ABLATION OF NAIL ANDSKIN GRAFT FULL THICKNESS (Right Hand)     Patient location during evaluation: PACU Anesthesia Type: General Level of consciousness: awake and alert Pain management: pain level controlled Vital Signs Assessment: post-procedure vital signs reviewed and stable Respiratory status: spontaneous breathing, nonlabored ventilation, respiratory function stable and patient connected to nasal cannula oxygen Cardiovascular status: blood pressure returned to baseline and stable Postop Assessment: no apparent nausea or vomiting Anesthetic complications: no Comments: Currently NSR    Last Vitals:  Vitals:   11/08/17 1116 11/08/17 1117  BP:    Pulse: 70 70  Resp: 15 16  Temp:    SpO2: 100% 100%    Last Pain:  Vitals:   11/08/17 1110  TempSrc:   PainSc: 0-No pain                 Effie Berkshire

## 2017-11-08 NOTE — Transfer of Care (Signed)
Immediate Anesthesia Transfer of Care Note  Patient: Brenda Lynch  Procedure(s) Performed: RIGHT SMALL FINGER ABLATION OF NAIL ANDSKIN GRAFT FULL THICKNESS (Right Hand)  Patient Location: PACU  Anesthesia Type:General  Level of Consciousness: awake, alert  and oriented  Airway & Oxygen Therapy: Patient Spontanous Breathing and Patient connected to face mask oxygen  Post-op Assessment: see note  Post vital signs: Reviewed  Last Vitals:  Vitals Value Taken Time  BP 163/62 11/08/2017 11:10 AM  Temp    Pulse 70 11/08/2017 11:14 AM  Resp 12 11/08/2017 11:14 AM  SpO2 100 % 11/08/2017 11:14 AM  Vitals shown include unvalidated device data.  Last Pain:  Vitals:   11/08/17 0841  TempSrc: Oral  PainSc: 1       Patients Stated Pain Goal: 0 (24/93/24 1991)  Complications:

## 2017-11-08 NOTE — H&P (Signed)
Brenda Lynch is an 84 y.o. female.   Chief Complaint: right small finger pincer nail HPI: 84 yo female with pincer nail right small finger.  It is bothersome to her.  She wishes to have ablation of the nail and full thickness skin grafting of the area.  Allergies:  Allergies  Allergen Reactions  . Ciprofloxacin     GI intolerance  . Penicillins Hives    Has patient had a PCN reaction causing immediate rash, facial/tongue/throat swelling, SOB or lightheadedness with hypotension: Yes Has patient had a PCN reaction causing severe rash involving mucus membranes or skin necrosis: No Has patient had a PCN reaction that required hospitalization No Has patient had a PCN reaction occurring within the last 10 years: No If all of the above answers are "NO", then may proceed with Cephalosporin use.  Marland Kitchen Penicillin G Other (See Comments)    welps on body welps on body    Past Medical History:  Diagnosis Date  . Allergic rhinitis   . Chronic diastolic CHF (congestive heart failure) ([place])   . CKD (chronic kidney disease), stage II   . Coronary artery disease   . Diabetes mellitus   . Diastolic dysfunction   . Diverticulosis   . Drug-induced bradycardia 01/28/2016  . Hypercholesterolemia   . Hypertension   . Hyponatremia   . Meningioma ([place])   . Osteoarthritis   . Sedentary lifestyle   . Spinal stenosis   . Vertigo, benign positional     Past Surgical History:  Procedure Laterality Date  . BACK SURGERY    . BREAST BIOPSY    . TONSILLECTOMY      Family History: Family History  Problem Relation Age of Onset  . Heart disease Mother   . Heart attack Mother   . Prostate cancer Father   . Lung cancer Brother   . Pancreatic cancer Brother     Social History:   reports that she quit smoking about 38 years ago. She has never used smokeless tobacco. She reports that she does not drink alcohol or use drugs.  Medications: Medications Prior to Admission  Medication Sig Dispense  Refill  . acetaminophen (TYLENOL) 500 MG tablet Take 500 mg by mouth every 6 (six) hours as needed for headache (pain).    Marland Kitchen aspirin EC 81 MG tablet Take 81 mg by mouth daily.    Marland Kitchen atorvastatin (LIPITOR) 20 MG tablet Take 20 mg by mouth daily.    . calcium carbonate (TUMS - DOSED IN MG ELEMENTAL CALCIUM) 500 MG chewable tablet Chew 1 tablet by mouth daily as needed for indigestion or heartburn.     . calcium citrate-vitamin D (CITRACAL+D) 315-200 MG-UNIT per tablet Take 1 tablet by mouth 2 (two) times daily.    . cetirizine (ZYRTEC) 10 MG tablet Take 10 mg by mouth daily.    . cholecalciferol (VITAMIN D) 1000 units tablet Take 1,000 Units by mouth daily.    . ferrous fumarate (HEMOCYTE - 106 MG FE) 325 (106 FE) MG TABS tablet Take 1 tablet by mouth as needed (iron).     . hydrocortisone (ANUSOL-HC) 25 MG suppository Place 25 mg rectally 2 (two) times daily as needed for hemorrhoids or itching.    . metFORMIN (GLUCOPHAGE) 500 MG tablet Take 500 mg by mouth daily with breakfast.     . metoprolol succinate (TOPROL-XL) 25 MG 24 hr tablet Take 1 tablet (25 mg total) by mouth daily. 30 tablet 11  . Misc Natural Products (OSTEO BI-FLEX  ADV DOUBLE ST PO) Take 1 tablet by mouth daily as needed (knee pain).     Marland Kitchen omeprazole (PRILOSEC OTC) 20 MG tablet Take 20 mg by mouth daily.    . ONE TOUCH ULTRA TEST test strip 1 each by Other route as directed.     . potassium chloride (MICRO-K) 10 MEQ CR capsule Take 10 mEq by mouth daily.    . ramipril (ALTACE) 5 MG capsule Take 1 capsule (5 mg total) by mouth daily. 30 capsule 11  . estradiol (ESTRACE) 0.1 MG/GM vaginal cream Place 1 Applicatorful vaginally 2 (two) times a week. Monday and Friday    . hydrochlorothiazide (MICROZIDE) 12.5 MG capsule Take 1 capsule (12.5 mg total) by mouth daily. 90 capsule 3  . indomethacin (INDOCIN) 50 MG capsule Take 50 mg by mouth 2 (two) times daily as needed (pain). Take with food or milk    . Lancets (ONETOUCH ULTRASOFT)  lancets 1 each by Other route as directed.     . montelukast (SINGULAIR) 10 MG tablet Take 10 mg by mouth at bedtime.     . senna (SENOKOT) 8.6 MG TABS tablet Take 2 tablets by mouth at bedtime as needed for mild constipation.    . triamcinolone (NASACORT) 55 MCG/ACT AERO nasal inhaler Place 2 sprays into the nose daily.      No results found for this or any previous visit (from the past 48 hour(s)).  No results found.   A comprehensive review of systems was negative except for: Respiratory: positive for shortness of breath Gastrointestinal: positive for constipation Hematologic/lymphatic: positive for easy bruising  Blood pressure (!) 184/81, pulse (!) 57, temperature 98.4 F (36.9 C), temperature source Oral, resp. rate 16, height 5\' 5"  (1.651 m), weight 52.5 kg, SpO2 100 %.  General appearance: alert, cooperative and appears stated age Head: Normocephalic, without obvious abnormality, atraumatic Neck: supple, symmetrical, trachea midline Cardio: regular rate and rhythm Resp: clear to auscultation bilaterally Extremities: Intact sensation and capillary refill all digits.  +epl/fpl/io.  No wounds.  Pulses: 2+ and symmetric Skin: Skin color, texture, turgor normal. No rashes or lesions Neurologic: Grossly normal Incision/Wound: none  Assessment/Plan Right small finger pincer nail.  Plan ablation of nail and full thickness skin grafting.  Non operative and operative treatment options were discussed with the patient and patient wishes to proceed with operative treatment. Risks, benefits, and alternatives of surgery were discussed and the patient agrees with the plan of care.   Brenda Lynch R 11/08/2017, 8:46 AM

## 2017-11-08 NOTE — Anesthesia Procedure Notes (Signed)
Procedure Name: LMA Insertion Performed by: Verita Lamb, CRNA Pre-anesthesia Checklist: Emergency Drugs available, Patient identified, Suction available, Patient being monitored and Timeout performed Patient Re-evaluated:Patient Re-evaluated prior to induction Oxygen Delivery Method: Circle system utilized Preoxygenation: Pre-oxygenation with 100% oxygen Induction Type: IV induction LMA: LMA inserted LMA Size: 3.0 Tube type: Oral Number of attempts: 1 Tube secured with: Tape Dental Injury: Teeth and Oropharynx as per pre-operative assessment

## 2017-11-08 NOTE — Op Note (Signed)
NAME: Brenda Lynch MEDICAL RECORD NO: [id number] DATE OF BIRTH: [birth year] FACILITY: [facility] LOCATION: [facility] PHYSICIAN: Tennis Must, MD   OPERATIVE REPORT   DATE OF PROCEDURE: 11/08/17    PREOPERATIVE DIAGNOSIS:   Right small finger pincer nail   POSTOPERATIVE DIAGNOSIS:   Right small finger pincer nail   PROCEDURE:   1.  Right small finger removal of nail and ablation of nail matrix 2.  Full-thickness skin grafting to right small finger from hyperthenar eminence measuring 35 x 13 mm   SURGEON:  Leanora Cover, M.D.   ASSISTANT: none   ANESTHESIA:  General   INTRAVENOUS FLUIDS:  Per anesthesia flow sheet.   ESTIMATED BLOOD LOSS:  Minimal.   COMPLICATIONS:  None.   SPECIMENS:  none   TOURNIQUET TIME:    Total Tourniquet Time Documented: Upper Arm (Left) - 42 minutes Total: Upper Arm (Left) - 42 minutes    DISPOSITION:  Stable to PACU.   INDICATIONS: 84 year old female has noted a pincer nail of the right small finger.  This is bothersome to her.  She wishes to have ablation of the nail matrix and full-thickness skin grafting. Risks, benefits and alternatives of surgery were discussed including the risks of blood loss, infection, damage to nerves, vessels, tendons, ligaments, bone for surgery, need for additional surgery, complications with wound healing, continued pain, stiffness, regrowth of nail.  She voiced understanding of these risks and elected to proceed.  OPERATIVE COURSE:  After being identified preoperatively by myself,  the patient and I agreed on the procedure and site of the procedure.  The surgical site was marked.  Surgical consent had been signed. She was given IV vancomycin as preoperative antibiotic prophylaxis. She was transferred to the operating room and placed on the operating table in supine position with the Right upper extremity on an arm board.  General anesthesia was induced by the anesthesiologist.  Right upper extremity  was prepped and draped in normal sterile orthopedic fashion.  A surgical pause was performed between the surgeons, anesthesia, and operating room staff and all were in agreement as to the patient, procedure, and site of procedure.  Tourniquet at the proximal aspect of the extremity was inflated to 250 mmHg after exsanguination of the arm with an Esmarch bandage.    The right small finger nail was removed with a freer elevator.  The nailbed and germinal matrix were then removed as well as the dorsal roof of the nail fold using a 15 blade.  Care was taken to try to remove all germinal matrix.  The extensor insertion was protected.  The skin had fallen volar to the distal phalanx on the ulnar side.  This was mobilized to allow to be brought up dorsally again.  A full-thickness skin graft was taken from the hyperthenar eminence where the skin was thicker.  This measured 35 x 13 mm.  The donor site was then closed by undermining the adjacent skin and bringing it back together with a 4-0 nylon suture in a horizontal mattress fashion.  Good tension-free reapproximation was obtained.  The distal phalanx wound was copiously irrigated with sterile saline.  The graft was placed over the defect and trimmed to shape.  5-0 nylon suture was used to create tiedown sutures.  A 5-0 chromic suture was used in a running fashion to approximate the skin edges with the graft edges.  Good contour was obtained.  The wounds were dressed with sterile Xeroform.  A cottonball bolster  was placed at the graft site and tied down with the nylon suture.  This had been moistened with bacitracin gel.  The wounds were dressed with sterile 4 x 4's and wrapped with a Coban dressing lightly.  An AlumaFoam splint was placed and wrapped lightly with Coban dressing.  A digital block had been performed with quarter percent plain Marcaine at the beginning of the procedure.  The donor site was also injected with quarter percent plain Marcaine.  The tourniquet  was deflated at 42 minutes.  Fingertips were pink with brisk capillary refill after deflation of tourniquet.  The operative  drapes were broken down.  The patient was awoken from anesthesia safely.  She was transferred back to the stretcher and taken to PACU in stable condition.  I will see her back in the office in 1 week for postoperative followup.  I will give her a prescription for Norco 5/325 1-2 tabs PO q6 hours prn pain, dispense # 20.   Tennis Must, MD Electronically signed, 11/08/17

## 2017-11-08 NOTE — Discharge Instructions (Addendum)

## 2017-11-08 NOTE — Anesthesia Preprocedure Evaluation (Addendum)
Anesthesia Evaluation  Patient identified by MRN, date of birth, ID band Patient awake    Reviewed: Allergy & Precautions, NPO status , Patient's Chart, lab work & pertinent test results  Airway Mallampati: I  TM Distance: >3 FB Neck ROM: Full    Dental  (+) Partial Upper, Lower Dentures   Pulmonary former smoker,    breath sounds clear to auscultation       Cardiovascular hypertension, + CAD and +CHF   Rhythm:Regular Rate:Normal     Neuro/Psych negative neurological ROS  negative psych ROS   GI/Hepatic negative GI ROS, Neg liver ROS,   Endo/Other  diabetes  Renal/GU CRFRenal disease     Musculoskeletal  (+) Arthritis ,   Abdominal Normal abdominal exam  (+)   Peds  Hematology negative hematology ROS (+)   Anesthesia Other Findings   Reproductive/Obstetrics                            Lab Results  Component Value Date   WBC 7.2 02/23/2011   HGB 14.4 02/23/2011   HCT 42.5 02/23/2011   MCV 92.4 02/23/2011   PLT 234 02/23/2011   Lab Results  Component Value Date   CREATININE 1.03 (H) 11/02/2017   BUN 12 11/02/2017   NA 136 11/02/2017   K 4.5 11/02/2017   CL 100 11/02/2017   CO2 27 11/02/2017   No results found for: INR, PROTIME  EKG: sinus bradycardia.   Anesthesia Physical Anesthesia Plan  ASA: III  Anesthesia Plan: General   Post-op Pain Management:    Induction: Intravenous  PONV Risk Score and Plan: 4 or greater and Ondansetron, Dexamethasone and Treatment may vary due to age or medical condition  Airway Management Planned: LMA  Additional Equipment: None  Intra-op Plan:   Post-operative Plan: Extubation in OR  Informed Consent: I have reviewed the patients History and Physical, chart, labs and discussed the procedure including the risks, benefits and alternatives for the proposed anesthesia with the patient or authorized representative who has indicated  his/her understanding and acceptance.   Dental advisory given  Plan Discussed with: CRNA  Anesthesia Plan Comments:        Anesthesia Quick Evaluation

## 2017-11-09 ENCOUNTER — Encounter (HOSPITAL_BASED_OUTPATIENT_CLINIC_OR_DEPARTMENT_OTHER): Payer: Self-pay | Admitting: Orthopedic Surgery

## 2018-01-07 ENCOUNTER — Other Ambulatory Visit: Payer: Self-pay | Admitting: Cardiology

## 2018-02-05 ENCOUNTER — Encounter: Payer: Self-pay | Admitting: Podiatry

## 2018-02-05 ENCOUNTER — Ambulatory Visit: Payer: Medicare Other | Admitting: Podiatry

## 2018-02-05 DIAGNOSIS — M79675 Pain in left toe(s): Secondary | ICD-10-CM | POA: Diagnosis not present

## 2018-02-05 DIAGNOSIS — M79674 Pain in right toe(s): Secondary | ICD-10-CM

## 2018-02-05 DIAGNOSIS — B351 Tinea unguium: Secondary | ICD-10-CM

## 2018-02-05 MED ORDER — CLOTRIMAZOLE 1 % EX CREA: TOPICAL_CREAM | CUTANEOUS | 1 refills | Status: DC

## 2018-02-05 NOTE — Patient Instructions (Addendum)
Diabetes and Foot Care Diabetes may cause you to have problems because of poor blood supply (circulation) to your feet and legs. This may cause the skin on your feet to become thinner, break easier, and heal more slowly. Your skin may become dry, and the skin may peel and crack. You may also have nerve damage in your legs and feet causing decreased feeling in them. You may not notice minor injuries to your feet that could lead to infections or more serious problems. Taking care of your feet is one of the most important things you can do for yourself. Follow these instructions at home:  Wear shoes at all times, even in the house. Do not go barefoot. Bare feet are easily injured.  Check your feet daily for blisters, cuts, and redness. If you cannot see the bottom of your feet, use a mirror or ask someone for help.  Wash your feet with warm water (do not use hot water) and mild soap. Then pat your feet and the areas between your toes until they are completely dry. Do not soak your feet as this can dry your skin.  Apply a moisturizing lotion or petroleum jelly (that does not contain alcohol and is unscented) to the skin on your feet and to dry, brittle toenails. Do not apply lotion between your toes.  Trim your toenails straight across. Do not dig under them or around the cuticle. File the edges of your nails with an emery board or nail file.  Do not cut corns or calluses or try to remove them with medicine.  Wear clean socks or stockings every day. Make sure they are not too tight. Do not wear knee-high stockings since they may decrease blood flow to your legs.  Wear shoes that fit properly and have enough cushioning. To break in new shoes, wear them for just a few hours a day. This prevents you from injuring your feet. Always look in your shoes before you put them on to be sure there are no objects inside.  Do not cross your legs. This may decrease the blood flow to your feet.  If you find a  minor scrape, cut, or break in the skin on your feet, keep it and the skin around it clean and dry. These areas may be cleansed with mild soap and water. Do not cleanse the area with peroxide, alcohol, or iodine.  When you remove an adhesive bandage, be sure not to damage the skin around it.  If you have a wound, look at it several times a day to make sure it is healing.  Do not use heating pads or hot water bottles. They may burn your skin. If you have lost feeling in your feet or legs, you may not know it is happening until it is too late.  Make sure your health care provider performs a complete foot exam at least annually or more often if you have foot problems. Report any cuts, sores, or bruises to your health care provider immediately. Contact a health care provider if:  You have an injury that is not healing.  You have cuts or breaks in the skin.  You have an ingrown nail.  You notice redness on your legs or feet.  You feel burning or tingling in your legs or feet.  You have pain or cramps in your legs and feet.  Your legs or feet are numb.  Your feet always feel cold. Get help right away if:  There is increasing   redness, swelling, or pain in or around a wound.  There is a red line that goes up your leg.  Pus is coming from a wound.  You develop a fever or as directed by your health care provider.  You notice a bad smell coming from an ulcer or wound. This information is not intended to replace advice given to you by your health care provider. Make sure you discuss any questions you have with your health care provider. Document Released: 03/03/2000 Document Revised: 08/12/2015 Document Reviewed: 08/13/2012 Elsevier Interactive Patient Education  2017 [place] are small areas of thickened skin that occur on the top, sides, or tip of a toe. They contain a cone-shaped core with a point that can press on a nerve below. This causes pain.  Calluses are areas of thickened skin that can occur anywhere on the body including hands, fingers, palms, soles of the feet, and heels.Calluses are usually larger than corns. What are the causes? Corns and calluses are caused by rubbing (friction) or pressure, such as from shoes that are too tight or do not fit properly. What increases the risk? Corns are more likely to develop in people who have toe deformities, such as hammer toes. Since calluses can occur with friction to any area of the skin, calluses are more likely to develop in people who:  Work with their hands.  Wear shoes that fit poorly, shoes that are too tight, or shoes that are high-heeled.  Have toes deformities.  What are the signs or symptoms? Symptoms of a corn or callus include:  A hard growth on the skin.  Pain or tenderness under the skin.  Redness and swelling.  Increased discomfort while wearing tight-fitting shoes.  How is this diagnosed? Corns and calluses may be diagnosed with a medical history and physical exam. How is this treated? Corns and calluses may be treated with:  Removing the cause of the friction or pressure. This may include: ? Changing your shoes. ? Wearing shoe inserts (orthotics) or other protective layers in your shoes, such as a corn pad. ? Wearing gloves.  Medicines to help soften skin in the hardened, thickened areas.  Reducing the size of the corn or callus by removing the dead layers of skin.  Antibiotic medicines to treat infection.  Surgery, if a toe deformity is the cause.  Follow these instructions at home:  Take medicines only as directed by your health care provider.  If you were prescribed an antibiotic, finish all of it even if you start to feel better.  Wear shoes that fit well. Avoid wearing high-heeled shoes and shoes that are too tight or too loose.  Wear any padding, protective layers, gloves, or orthotics as directed by your health care provider.  Soak  your hands or feet and then use a file or pumice stone to soften your corn or callus. Do this as directed by your health care provider.  Check your corn or callus every day for signs of infection. Watch for: ? Redness, swelling, or pain. ? Fluid, blood, or pus. Contact a health care provider if:  Your symptoms do not improve with treatment.  You have increased redness, swelling, or pain at the site of your corn or callus.  You have fluid, blood, or pus coming from your corn or callus.  You have new symptoms. This information is not intended to replace advice given to you by your health care provider. Make sure you discuss any questions  you have with your health care provider. Document Released: 12/11/2003 Document Revised: 09/24/2015 Document Reviewed: 03/02/2014 Elsevier Interactive Patient Education  2018 [place]  WHAT IS IT? An infection that lies within the keratin of your nail plate that is caused by a fungus.  WHY ME? Fungal infections affect all ages, sexes, races, and creeds.  There may be many factors that predispose you to a fungal infection such as age, coexisting medical conditions such as diabetes, or an autoimmune disease; stress, medications, fatigue, genetics, etc.  Bottom line: fungus thrives in a warm, moist environment and your shoes offer such a location.  IS IT CONTAGIOUS? Theoretically, yes.  You do not want to share shoes, nail clippers or files with someone who has fungal toenails.  Walking around barefoot in the same room or sleeping in the same bed is unlikely to transfer the organism.  It is important to realize, however, that fungus can spread easily from one nail to the next on the same foot.  HOW DO WE TREAT THIS?  There are several ways to treat this condition.  Treatment may depend on many factors such as age, medications, pregnancy, liver and kidney conditions, etc.  It is best to ask your doctor which options are  available to you.  1. No treatment.   Unlike many other medical concerns, you can live with this condition.  However for many people this can be a painful condition and may lead to ingrown toenails or a bacterial infection.  It is recommended that you keep the nails cut short to help reduce the amount of fungal nail. 2. Topical treatment.  These range from herbal remedies to prescription strength nail lacquers.  About 40-50% effective, topicals require twice daily application for approximately 9 to 12 months or until an entirely new nail has grown out.  The most effective topicals are medical grade medications available through physicians offices. 3. Oral antifungal medications.  With an 80-90% cure rate, the most common oral medication requires 3 to 4 months of therapy and stays in your system for a year as the new nail grows out.  Oral antifungal medications do require blood work to make sure it is a safe drug for you.  A liver function panel will be performed prior to starting the medication and after the first month of treatment.  It is important to have the blood work performed to avoid any harmful side effects.  In general, this medication safe but blood work is required. 4. Laser Therapy.  This treatment is performed by applying a specialized laser to the affected nail plate.  This therapy is noninvasive, fast, and non-painful.  It is not covered by insurance and is therefore, out of pocket.  The results have been very good with a 80-95% cure rate.  [facility] is the only practice in the area to offer this therapy. 5. Permanent Nail Avulsion.  Removing the entire nail so that a new nail will not grow back.

## 2018-02-25 ENCOUNTER — Encounter: Payer: Self-pay | Admitting: Podiatry

## 2018-02-25 NOTE — Progress Notes (Signed)
Subjective: Brenda Lynch presents today with painful, thick toenails 1-5 b/l that she cannot cut and which interfere with daily activities.  Pain is aggravated when wearing enclosed shoe gear and relieved with periodic professional debridement.  Brenda Lynch voices no new pedal problems on today's visit.  Medications    acetaminophen (TYLENOL) 500 MG tablet    aspirin EC 81 MG tablet    atorvastatin (LIPITOR) 20 MG tablet    calcium carbonate (TUMS - DOSED IN MG ELEMENTAL CALCIUM) 500 MG chewable tablet    calcium citrate-vitamin D (CITRACAL+D) 315-200 MG-UNIT per tablet    cetirizine (ZYRTEC) 10 MG tablet    cholecalciferol (VITAMIN D) 1000 units tablet    estradiol (ESTRACE) 0.1 MG/GM vaginal cream    ferrous fumarate (HEMOCYTE - 106 MG FE) 325 (106 FE) MG TABS tablet    HYDROcodone-acetaminophen (NORCO) 5-325 MG tablet    hydrocortisone (ANUSOL-HC) 25 MG suppository    indomethacin (INDOCIN) 50 MG capsule    Lancets (ONETOUCH ULTRASOFT) lancets    metFORMIN (GLUCOPHAGE) 500 MG tablet    metoprolol succinate (TOPROL-XL) 25 MG 24 hr tablet    Misc Natural Products (GLUCOSAMINE-CHONDROITIN SULF PO)    Misc Natural Products (OSTEO BI-FLEX ADV DOUBLE ST PO)    montelukast (SINGULAIR) 10 MG tablet    omeprazole (PRILOSEC OTC) 20 MG tablet    ONE TOUCH ULTRA TEST test strip    potassium chloride (MICRO-K) 10 MEQ CR capsule    ramipril (ALTACE) 5 MG capsule    senna (SENOKOT) 8.6 MG TABS tablet    senna (SENOKOT) 8.6 MG TABS tablet    triamcinolone (NASACORT) 55 MCG/ACT AERO nasal inhaler      Allergies  Allergen Reactions  . Ciprofloxacin     GI intolerance  . Penicillins Hives    Has patient had a PCN reaction causing immediate rash, facial/tongue/throat swelling, SOB or lightheadedness with hypotension: Yes Has patient had a PCN reaction causing severe rash involving mucus membranes or skin necrosis: No Has patient had a PCN reaction that required hospitalization No Has  patient had a PCN reaction occurring within the last 10 years: No If all of the above answers are "NO", then may proceed with Cephalosporin use.  Marland Kitchen Penicillin G Other (See Comments)    welps on body welps on body    Objective:  Vascular Examination: Capillary refill time immediate x 10 digits Dorsalis pedis and Posterior tibial pulses palpable b/l Digital hair present x 10 digits Skin temperature gradient WNL b/l Edema b/l LE  Dermatological Examination: Skin with normal turgor, texture and tone b/l  Toenails 1-5 b/l discolored, thick, dystrophic with subungual debris and pain with palpation to nailbeds due to thickness of nails.  Musculoskeletal: Muscle strength 5/5 to all LE muscle groups  Neurological: Sensation intact with 10 gram monofilament. Vibratory sensation intact.  Assessment: Painful onychomycosis toenails 1-5 b/l    Plan: 1. Toenails 1-5 b/l were debrided in length and girth without iatrogenic bleeding. Rx for Clotrimazole Cream 1% to be applied to both feet and between toes bid x 4 weeks 2. Patient to continue soft, supportive shoe gear 3. Patient to report any pedal injuries to medical professional immediately. 4. Follow up 3 months. Patient/POA to call should there be a concern in the interim.

## 2018-03-12 ENCOUNTER — Other Ambulatory Visit: Payer: Self-pay | Admitting: Cardiology

## 2018-04-26 ENCOUNTER — Encounter: Payer: Self-pay | Admitting: Cardiology

## 2018-05-07 ENCOUNTER — Ambulatory Visit: Payer: Medicare Other | Admitting: Podiatry

## 2018-05-07 ENCOUNTER — Other Ambulatory Visit: Payer: Self-pay

## 2018-05-07 ENCOUNTER — Encounter: Payer: Self-pay | Admitting: Podiatry

## 2018-05-07 DIAGNOSIS — M79675 Pain in left toe(s): Secondary | ICD-10-CM

## 2018-05-07 DIAGNOSIS — M79674 Pain in right toe(s): Secondary | ICD-10-CM

## 2018-05-07 DIAGNOSIS — B351 Tinea unguium: Secondary | ICD-10-CM | POA: Diagnosis not present

## 2018-05-07 NOTE — Patient Instructions (Signed)

## 2018-05-07 NOTE — Progress Notes (Signed)
Subjective: Brenda Lynch presents today with painful, thick toenails 1-5 b/l that she cannot cut and which interfere with daily activities.  Pain is aggravated when wearing enclosed shoe gear.  She is accompanied by her son on today.  Brenda Lynch nor her son voiced any new pedal concerns on today's visit.  Brenda Low, MD is her PCP.   Current Outpatient Medications:  .  acetaminophen (TYLENOL) 500 MG tablet, Take by mouth., Disp: , Rfl:  .  aspirin EC 81 MG tablet, Take 81 mg by mouth daily., Disp: , Rfl:  .  atorvastatin (LIPITOR) 20 MG tablet, Take 20 mg by mouth daily., Disp: , Rfl:  .  calcium carbonate (TUMS - DOSED IN MG ELEMENTAL CALCIUM) 500 MG chewable tablet, Chew 1 tablet by mouth daily as needed for indigestion or heartburn. , Disp: , Rfl:  .  calcium citrate-vitamin D (CITRACAL+D) 315-200 MG-UNIT per tablet, Take 1 tablet by mouth 2 (two) times daily., Disp: , Rfl:  .  cetirizine (ZYRTEC) 10 MG tablet, Take 10 mg by mouth daily., Disp: , Rfl:  .  cholecalciferol (VITAMIN D) 1000 units tablet, Take 1,000 Units by mouth daily., Disp: , Rfl:  .  ciprofloxacin (CIPRO) 250 MG tablet, , Disp: , Rfl:  .  clotrimazole (LOTRIMIN) 1 % cream, Apply to both feet and between toes twice daily, Disp: 30 g, Rfl: 1 .  estradiol (ESTRACE) 0.1 MG/GM vaginal cream, Place 1 Applicatorful vaginally 2 (two) times a week. Monday and Friday, Disp: , Rfl:  .  ferrous fumarate (HEMOCYTE - 106 MG FE) 325 (106 FE) MG TABS tablet, Take 1 tablet by mouth as needed (iron). , Disp: , Rfl:  .  hydrochlorothiazide (MICROZIDE) 12.5 MG capsule, TAKE (1) CAPSULE DAILY., Disp: 30 capsule, Rfl: 6 .  HYDROcodone-acetaminophen (NORCO) 5-325 MG tablet, 1-2 tabs po q6 hours prn pain, Disp: 20 tablet, Rfl: 0 .  hydrocortisone (ANUSOL-HC) 25 MG suppository, Place 25 mg rectally 2 (two) times daily as needed for hemorrhoids or itching., Disp: , Rfl:  .  indomethacin (INDOCIN) 50 MG capsule, Take 50 mg by mouth 2 (two)  times daily as needed (pain). Take with food or milk, Disp: , Rfl:  .  Lancets (ONETOUCH ULTRASOFT) lancets, 1 each by Other route as directed. , Disp: , Rfl:  .  metFORMIN (GLUCOPHAGE) 500 MG tablet, Take 500 mg by mouth daily with breakfast. , Disp: , Rfl:  .  metoprolol succinate (TOPROL-XL) 25 MG 24 hr tablet, TAKE 1 TABLET ONCE DAILY., Disp: 30 tablet, Rfl: 9 .  Misc Natural Products (GLUCOSAMINE-CHONDROITIN SULF PO), Take by mouth., Disp: , Rfl:  .  Misc Natural Products (OSTEO BI-FLEX ADV DOUBLE ST PO), Take 1 tablet by mouth daily as needed (knee pain). , Disp: , Rfl:  .  montelukast (SINGULAIR) 10 MG tablet, Take 10 mg by mouth at bedtime. , Disp: , Rfl:  .  omeprazole (PRILOSEC OTC) 20 MG tablet, Take 20 mg by mouth daily., Disp: , Rfl:  .  ONE TOUCH ULTRA TEST test strip, 1 each by Other route as directed. , Disp: , Rfl:  .  potassium chloride (MICRO-K) 10 MEQ CR capsule, Take 10 mEq by mouth daily., Disp: , Rfl:  .  ramipril (ALTACE) 5 MG capsule, Take 1 capsule (5 mg total) by mouth daily., Disp: 30 capsule, Rfl: 11 .  senna (SENOKOT) 8.6 MG TABS tablet, Take 2 tablets by mouth at bedtime as needed for mild constipation., Disp: , Rfl:  .  senna (SENOKOT) 8.6 MG TABS tablet, Take by mouth., Disp: , Rfl:  .  triamcinolone (NASACORT) 55 MCG/ACT AERO nasal inhaler, Place 2 sprays into the nose daily., Disp: , Rfl:   Allergies  Allergen Reactions  . Ciprofloxacin     GI intolerance  . Penicillins Hives    Has patient had a PCN reaction causing immediate rash, facial/tongue/throat swelling, SOB or lightheadedness with hypotension: Yes Has patient had a PCN reaction causing severe rash involving mucus membranes or skin necrosis: No Has patient had a PCN reaction that required hospitalization No Has patient had a PCN reaction occurring within the last 10 years: No If all of the above answers are "NO", then may proceed with Cephalosporin use.  Marland Kitchen Penicillin G Other (See Comments)     welps on body welps on body    Objective:  Vascular Examination: Capillary refill time immediate x 10 digits  Dorsalis pedis and Posterior tibial pulses palpable b/l  Digital hair present x 10 digits  Skin temperature gradient WNL b/l  Dermatological Examination: Skin with normal turgor, texture and tone b/l  Toenails 1-5 b/l discolored, thick, dystrophic with subungual debris and pain with palpation to nailbeds due to thickness of nails.  Musculoskeletal: Muscle strength 5/5 to all LE muscle groups  Neurological: Sensation intact with 10 gram monofilament.  Vibratory sensation intact.  Assessment: Painful onychomycosis toenails 1-5 b/l   Plan: 1. Toenails 1-5 b/l were debrided in length and girth without iatrogenic bleeding. 2. Patient to continue soft, supportive shoe gear. 3. Patient to report any pedal injuries to medical professional immediately. 4. Follow up 3 months.  5. Patient/POA to call should there be a concern in the interim.

## 2018-05-15 ENCOUNTER — Ambulatory Visit: Payer: Medicare Other | Admitting: Cardiology

## 2018-05-15 ENCOUNTER — Encounter: Payer: Self-pay | Admitting: Cardiology

## 2018-05-15 VITALS — BP 150/80 | HR 57 | Ht 66.0 in | Wt 120.0 lb

## 2018-05-15 DIAGNOSIS — I10 Essential (primary) hypertension: Secondary | ICD-10-CM

## 2018-05-15 DIAGNOSIS — E7800 Pure hypercholesterolemia, unspecified: Secondary | ICD-10-CM

## 2018-05-15 DIAGNOSIS — I5032 Chronic diastolic (congestive) heart failure: Secondary | ICD-10-CM

## 2018-05-15 NOTE — Progress Notes (Signed)
Cardiology Office Note:    Date:  05/15/2018   ID:  Brenda Lynch, DOB 1923-08-14, MRN [id number]  PCP:  [name], MD  Cardiologist:  No primary care provider on file.    Referring MD: [name], MD   Chief Complaint  Patient presents with  . Congestive Heart Failure  . Hypertension    History of Present Illness:    Brenda Lynch is a 85 y.o. female with a hx of HTN, diastolic dysfunction with chronic diastolic CHF, chronic SOB secondary to chronic diastolic CHF/sedentary lifestyle and anxiety, chronically abnormal EKG with LVH and repolarization.  He is here today for followup and is doing well.  He denies any chest pain or pressure, SOB, DOE, PND, orthopnea, LE edema, dizziness, palpitations or syncope. He is compliant with his meds and is tolerating meds with no SE.    Past Medical History:  Diagnosis Date  . Allergic rhinitis   . Chronic diastolic CHF (congestive heart failure) ([place])   . CKD (chronic kidney disease), stage II   . Coronary artery disease   . Diabetes mellitus   . Diastolic dysfunction   . Diverticulosis   . Drug-induced bradycardia 01/28/2016  . Hypercholesterolemia   . Hypertension   . Hyponatremia   . Meningioma ([place])   . Osteoarthritis   . Sedentary lifestyle   . Spinal stenosis   . Vertigo, benign positional     Past Surgical History:  Procedure Laterality Date  . BACK SURGERY    . BREAST BIOPSY    . SKIN FULL THICKNESS GRAFT Right 11/08/2017   Procedure: RIGHT SMALL FINGER ABLATION OF NAIL ANDSKIN GRAFT FULL THICKNESS;  Surgeon: Leanora Cover, MD;  Location: [facility];  Service: Orthopedics;  Laterality: Right;  . TONSILLECTOMY      Current Medications: Current Meds  Medication Sig  . acetaminophen (TYLENOL) 500 MG tablet Take by mouth.  Marland Kitchen aspirin EC 81 MG tablet Take 81 mg by mouth daily.  Marland Kitchen atorvastatin (LIPITOR) 20 MG tablet Take 20 mg by mouth daily.  . calcium carbonate (TUMS - DOSED IN MG ELEMENTAL  CALCIUM) 500 MG chewable tablet Chew 1 tablet by mouth daily as needed for indigestion or heartburn.   . cetirizine (ZYRTEC) 10 MG tablet Take 10 mg by mouth daily.  . cholecalciferol (VITAMIN D) 1000 units tablet Take 1,000 Units by mouth daily.  . clotrimazole (LOTRIMIN) 1 % cream Apply to both feet and between toes twice daily  . estradiol (ESTRACE) 0.1 MG/GM vaginal cream Place 1 Applicatorful vaginally 2 (two) times a week. Monday and Friday  . ferrous fumarate (HEMOCYTE - 106 MG FE) 325 (106 FE) MG TABS tablet Take 1 tablet by mouth as needed (iron).   . hydrochlorothiazide (MICROZIDE) 12.5 MG capsule TAKE (1) CAPSULE DAILY.  Marland Kitchen HYDROcodone-acetaminophen (NORCO) 5-325 MG tablet 1-2 tabs po q6 hours prn pain  . hydrocortisone (ANUSOL-HC) 25 MG suppository Place 25 mg rectally 2 (two) times daily as needed for hemorrhoids or itching.  . indomethacin (INDOCIN) 50 MG capsule Take 50 mg by mouth 2 (two) times daily as needed (pain). Take with food or milk  . metoprolol succinate (TOPROL-XL) 25 MG 24 hr tablet TAKE 1 TABLET ONCE DAILY.  Marland Kitchen Misc Natural Products (GLUCOSAMINE-CHONDROITIN SULF PO) Take by mouth.  . Misc Natural Products (OSTEO BI-FLEX ADV DOUBLE ST PO) Take 1 tablet by mouth daily as needed (knee pain).   . montelukast (SINGULAIR) 10 MG tablet Take 10 mg by mouth at  bedtime.   Marland Kitchen omeprazole (PRILOSEC OTC) 20 MG tablet Take 20 mg by mouth daily.  . potassium chloride (MICRO-K) 10 MEQ CR capsule Take 10 mEq by mouth daily.  . ramipril (ALTACE) 5 MG capsule Take 1 capsule (5 mg total) by mouth daily.  Marland Kitchen senna (SENOKOT) 8.6 MG TABS tablet Take 2 tablets by mouth at bedtime as needed for mild constipation.  . senna (SENOKOT) 8.6 MG TABS tablet Take by mouth.  . triamcinolone (NASACORT) 55 MCG/ACT AERO nasal inhaler Place 2 sprays into the nose daily.     Allergies:   Ciprofloxacin; Penicillins; and Penicillin g   Social History   Socioeconomic History  . Marital status: Widowed     Spouse name: Not on file  . Number of children: Not on file  . Years of education: Not on file  . Highest education level: Not on file  Occupational History  . Occupation: retired  Scientific laboratory technician  . Financial resource strain: Not on file  . Food insecurity:    Worry: Not on file    Inability: Not on file  . Transportation needs:    Medical: Not on file    Non-medical: Not on file  Tobacco Use  . Smoking status: Former Smoker    Last attempt to quit: 01/30/1979    Years since quitting: 39.3  . Smokeless tobacco: Never Used  Substance and Sexual Activity  . Alcohol use: No  . Drug use: No  . Sexual activity: Not on file  Lifestyle  . Physical activity:    Days per week: Not on file    Minutes per session: Not on file  . Stress: Not on file  Relationships  . Social connections:    Talks on phone: Not on file    Gets together: Not on file    Attends religious service: Not on file    Active member of club or organization: Not on file    Attends meetings of clubs or organizations: Not on file    Relationship status: Not on file  Other Topics Concern  . Not on file  Social History Narrative   Lives alone with sons close by     Family History: The patient's family history includes Heart attack in her mother; Heart disease in her mother; Lung cancer in her brother; Pancreatic cancer in her brother; Prostate cancer in her father.  ROS:   Please see the history of present illness.    ROS  All other systems reviewed and negative.   EKGs/Labs/Other Studies Reviewed:    The following studies were reviewed today: none  EKG:  EKG is  ordered today.  The ekg ordered today demonstrates sinus bradycardia 57 bpm with LVH with repolarization abnormality.  No change from EKG 01/06/2017.  Recent Labs: 11/02/2017: BUN 12; Creatinine, Ser 1.03; Potassium 4.5; Sodium 136   Recent Lipid Panel No results found for: CHOL, TRIG, HDL, CHOLHDL, VLDL, LDLCALC, LDLDIRECT  Physical Exam:     VS:  BP (!) 150/80   Pulse (!) 57   Ht 5\' 6"  (1.676 m)   Wt 120 lb (54.4 kg)   BMI 19.37 kg/m     Wt Readings from Last 3 Encounters:  05/15/18 120 lb (54.4 kg)  11/08/17 115 lb 12.8 oz (52.5 kg)  10/05/17 115 lb 12.8 oz (52.5 kg)     GEN:  Well nourished, well developed in no acute distress HEENT: Normal NECK: No JVD; No carotid bruits LYMPHATICS: No lymphadenopathy CARDIAC: RRR, no  murmurs, rubs, gallops RESPIRATORY:  Clear to auscultation without rales, wheezing or rhonchi  ABDOMEN: Soft, non-tender, non-distended MUSCULOSKELETAL:  No edema; No deformity  SKIN: Warm and dry NEUROLOGIC:  Alert and oriented x 3 PSYCHIATRIC:  Normal affect   ASSESSMENT:    1. Chronic diastolic CHF (congestive heart failure) ([place])   2. Essential hypertension   3. Hypercholesterolemia    PLAN:    In order of problems listed above:  1.  Chronic diastolic CHF -not appear volume overloaded on exam today.  She has not required diuretic therapy.  2.  HTN -appears controlled on exam today.  She will continue on ramipril 5 mg daily, Toprol-XL 25 mg daily.  Creatinine was 1.11 and potassium 4.4 on 04/19/2018.  3.  Hyperlipidemia -LDL was 125 on 09/03/2017.  She is not on lipid-lowering agent and this is followed by her PCP.   Medication Adjustments/Labs and Tests Ordered: Current medicines are reviewed at length with the patient today.  Concerns regarding medicines are outlined above.  No orders of the defined types were placed in this encounter.  No orders of the defined types were placed in this encounter.   Signed, Fransico Him, MD  05/15/2018 8:37 AM    [place]

## 2018-05-15 NOTE — Patient Instructions (Signed)

## 2018-08-06 ENCOUNTER — Ambulatory Visit: Payer: Medicare Other | Admitting: Podiatry

## 2018-10-28 ENCOUNTER — Telehealth: Payer: Self-pay | Admitting: Cardiology

## 2018-10-28 NOTE — Telephone Encounter (Signed)
I spoke to the patient's son and left a message for [facility] from [facility] with Dr Theodosia Blender recommendation.  They are to stop Metoprolol Succinate (Toprol) and check HR/BP daily and report results Friday 8/14.

## 2018-10-28 NOTE — Telephone Encounter (Signed)
Stop toprol and check BP and HR daily for a week and call with the results on Friday

## 2018-10-28 NOTE — Telephone Encounter (Signed)
  Cindy from [facility] is calling to ask what parameters Dr Radford Pax would like for the patients BP and HR. The patient had a HR of 44 this morning so they need to know when Dr Radford Pax would like to be notified. Can leave message in voicemail if needed.

## 2018-11-01 NOTE — Telephone Encounter (Signed)
Yes please have patient stay off Toprol

## 2018-11-01 NOTE — Telephone Encounter (Signed)
Follow Up   Brenda Lynch (nurse from [facility]) calling back in to report blood pressure and heart rate for the week.   Blood Pressure and Heart Rate:  Tuesday 8/11        100/54 hr 51 Wednesday 8/12  112/57 hr 56 Thursday 8/13       107/64 hr 58 Friday 8/14            121/69 hr 14   Brenda Lynch has requested that a call back be made to speak with Brenda Lynch to instruct on whether medication Toprol needs to be permanently stopped.

## 2018-11-01 NOTE — Telephone Encounter (Signed)
Left Jenny Reichmann, Nurse Case Manager at [facility], a message to call the office back and request to speak with a triage nurse, to endorse to her med recommendations per Dr. Radford Pax, on this pt.

## 2018-11-04 ENCOUNTER — Other Ambulatory Visit: Payer: Self-pay | Admitting: Cardiology

## 2018-11-04 NOTE — Telephone Encounter (Signed)
Left detailed message on the confidential voice mail of [place] with [facility] with Dr. Theodosia Blender advice to stop Toprol. I advised Jenny Reichmann to call back with questions or concerns.

## 2019-03-27 ENCOUNTER — Other Ambulatory Visit: Payer: Self-pay

## 2019-03-27 ENCOUNTER — Encounter: Payer: Medicare PPO | Attending: Gastroenterology | Admitting: Dietician

## 2019-03-27 ENCOUNTER — Encounter: Payer: Self-pay | Admitting: Dietician

## 2019-03-27 DIAGNOSIS — Z713 Dietary counseling and surveillance: Secondary | ICD-10-CM | POA: Diagnosis present

## 2019-03-27 DIAGNOSIS — E119 Type 2 diabetes mellitus without complications: Secondary | ICD-10-CM | POA: Diagnosis present

## 2019-03-27 DIAGNOSIS — E739 Lactose intolerance, unspecified: Secondary | ICD-10-CM | POA: Insufficient documentation

## 2019-03-27 NOTE — Progress Notes (Signed)
Medical Nutrition Therapy:  Appt start time: 1310 end time:  1400.   Assessment:  Primary concerns today: Patient is here today with her son.  He would like to made sure that Jesslynn has enough variety in her meals and address her GI issues (gas and upset stomach). She does not like a lot of different foods and will only eat green beans (canned) and spinach, chicken, fish, country style steak, mac and cheese, rice, tator tots, Son thinks that she has a misunderstanding of constipation and gas.  She would give herself enemas occasionally in the past as well as other regular use of over the counter medications for constipation. Patient is concerned with constipation. She takes Murelax. BM daily but has to strain  Patient complains of gas often ("has always had this problem"). She would use a heating pad to help and notes that this is better when she is more active. They have changed the heavy meal to lunch, added align and miralax all of which have helped. She has dentures and it is hard for her to chew some foods.  History includes Type 2 Diabetes with polyneuropathy, CKD 2, hypercholesterolemia, GERD, lactose intolerant and bloating. A1C 6.1% 09/2019 and 6.6% 10/2013. Weight today 120 lbs BG 115 after breakfast Medications include iron for anemia, vitamin D  Patient's son lives with her.  He does the shopping and cooking. 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  She is a retired Pharmacist, hospital.  Preferred Learning Style:   No preference indicated   Learning Readiness:   Ready  Change in progress  DIETARY INTAKE:  Usual eating pattern includes 3 meals and 2-3 snacks per day. Avoided foods include regular milk.    24-hr recall:  Snack:  Decaf coffee, honey or splenda with 2 slices toast with jelly B ( AM): oatmeal with raisins OR grits with bacon OR Sunday waffles with scrambled egg OR silk milk and cheerios Snk ( AM): snack similar to below  L ( PM):  (heaviest meal), chicken, starch, vegetable, applesauce OR Whopper Snk ( PM): cheese its, graham crackers, peanut butter, vanilla wafers, cheetos D ( PM): chicken noodle soup and sandwich on [employer] Snk ( PM): occasional snack Beverages: water (20-30 ounces per day), cranberry juice 6 ounces, occasional regular gingerale or Dr. Malachi Bonds, Ensure (2 daily)  Usual physical activity: walks outside when it is not too cold.  Progress Towards Goal(s):  In progress.   Nutritional Diagnosis:  NB-1.1 Food and nutrition-related knowledge deficit As related to nutrition and GI concerns.  As evidenced by diet hx and patient/son report.    Intervention:  Nutrition education related to GI symptoms including constipation and bloating.  General adequacy of diet evaluated and choices related to diabetes also were assessed.  Discussed her iron supplementation, the need for this due to history of anemia, and to discuss the need with her MD as well as an iron supplement that would not cause constipation.  Discussed need for adequate fluid.    Plan: Adequate fluid Introduce fiber slowly and back off of items not well tolerated  Sweet potato, plums, stewed prunes, spinach- see list Consider trial of Activia (2 per day for 2 weeks) to see if this works. Continue Ensure Discuss your iron with your MD and/or Pharmacy  Teaching Method Utilized:  Visual Auditory Hands on  Handouts given during visit include:  Constipation nutrition therapy from AND  Constipation meal planning tips from AND  Barriers to learning/adherence to  lifestyle change: none  Demonstrated degree of understanding via:  Teach Back   Monitoring/Evaluation:  Dietary intake, exercise, and body weight prn.

## 2019-03-27 NOTE — Patient Instructions (Addendum)
Adequate fluid Introduce fiber slowly and back off of items not well tolerated  Sweet potato, plums, stewed prunes, spinach- see list Consider trial of Activia (2 per day for 2 weeks) to see if this works. Continue Ensure Discuss your iron with your MD and/or Pharmacy

## 2019-04-02 ENCOUNTER — Other Ambulatory Visit: Payer: Self-pay | Admitting: Cardiology

## 2019-05-16 ENCOUNTER — Encounter: Payer: Self-pay | Admitting: Cardiology

## 2019-05-16 ENCOUNTER — Other Ambulatory Visit: Payer: Self-pay

## 2019-05-16 ENCOUNTER — Ambulatory Visit: Payer: Medicare PPO | Admitting: Cardiology

## 2019-05-16 ENCOUNTER — Telehealth: Payer: Self-pay | Admitting: Radiology

## 2019-05-16 VITALS — BP 118/82 | HR 56 | Ht 65.0 in | Wt 119.0 lb

## 2019-05-16 DIAGNOSIS — I5032 Chronic diastolic (congestive) heart failure: Secondary | ICD-10-CM | POA: Diagnosis not present

## 2019-05-16 DIAGNOSIS — E7800 Pure hypercholesterolemia, unspecified: Secondary | ICD-10-CM

## 2019-05-16 DIAGNOSIS — E119 Type 2 diabetes mellitus without complications: Secondary | ICD-10-CM | POA: Diagnosis not present

## 2019-05-16 DIAGNOSIS — I10 Essential (primary) hypertension: Secondary | ICD-10-CM

## 2019-05-16 DIAGNOSIS — R002 Palpitations: Secondary | ICD-10-CM

## 2019-05-16 NOTE — Telephone Encounter (Signed)
Enrolled patient for a 14 day Zio monitor to be mailed to patients home.  

## 2019-05-16 NOTE — Patient Instructions (Signed)
Medication Instructions:  Your physician recommends that you continue on your current medications as directed. Please refer to the Current Medication list given to you today.  *If you need a refill on your cardiac medications before your next appointment, please call your pharmacy*   Lab Work: TODAY: FLP and CMET If you have labs (blood work) drawn today and your tests are completely normal, you will receive your results only by: Marland Kitchen MyChart Message (if you have MyChart) OR . A paper copy in the mail If you have any lab test that is abnormal or we need to change your treatment, we will call you to review the results.   Testing/Procedures: Your physician has recommended that you wear an event monitor. Event monitors are medical devices that record the heart's electrical activity. Doctors most often [place] these monitors to diagnose arrhythmias. Arrhythmias are problems with the speed or rhythm of the heartbeat. The monitor is a small, portable device. You can wear one while you do your normal daily activities. This is usually used to diagnose what is causing palpitations/syncope (passing out).   Follow-Up: At [facility], you and your health needs are our priority.  As part of our continuing mission to provide you with exceptional heart care, we have created designated Provider Care Teams.  These Care Teams include your primary Cardiologist (physician) and Advanced Practice Providers (APPs -  Physician Assistants and Nurse Practitioners) who all work together to provide you with the care you need, when you need it.  We recommend signing up for the patient portal called "MyChart".  Sign up information is provided on this After Visit Summary.  MyChart is used to connect with patients for Virtual Visits (Telemedicine).  Patients are able to view lab/test results, encounter notes, upcoming appointments, etc.  Non-urgent messages can be sent to your provider as well.   To learn more about what you can  do with MyChart, go to [email or web address].    Your next appointment:   1 year(s)  The format for your next appointment:   In Person  Provider:   Fransico Him, MD

## 2019-05-16 NOTE — Progress Notes (Signed)
Cardiology Office Note:    Date:  05/16/2019   ID:  AHSAKI FESPERMAN, DOB [birth year], MRN [id number]  PCP:  [name], MD  Cardiologist:  No primary care provider on file.    Referring MD: [name], MD   Chief Complaint  Patient presents with  . Congestive Heart Failure  . Hypertension  . Hyperlipidemia    History of Present Illness:    Brenda Lynch is a 86 y.o. female with a hx of HTN, diastolic dysfunction with chronic diastolic CHF, chronic SOB secondary to chronic diastolic CHF/sedentary lifestyle and anxiety, chronically abnormal EKG with LVH and repolarization.  She is here today for followup and is doing well.  She denies any chest pain or pressure, SOB, DOE, PND, orthopnea, LE edema, dizziness, palpitations or syncope. She is compliant with her meds and is tolerating meds with no SE.    Past Medical History:  Diagnosis Date  . Allergic rhinitis   . Chronic diastolic CHF (congestive heart failure) ([place])   . CKD (chronic kidney disease), stage II   . Coronary artery disease   . Diabetes mellitus   . Diastolic dysfunction   . Diverticulosis   . Drug-induced bradycardia 01/28/2016  . Hypercholesterolemia   . Hypertension   . Hyponatremia   . Meningioma ([place])   . Osteoarthritis   . Sedentary lifestyle   . Spinal stenosis   . Vertigo, benign positional     Past Surgical History:  Procedure Laterality Date  . BACK SURGERY    . BREAST BIOPSY    . SKIN FULL THICKNESS GRAFT Right 11/08/2017   Procedure: RIGHT SMALL FINGER ABLATION OF NAIL ANDSKIN GRAFT FULL THICKNESS;  Surgeon: Leanora Cover, MD;  Location: [facility];  Service: Orthopedics;  Laterality: Right;  . TONSILLECTOMY      Current Medications: Current Meds  Medication Sig  . acetaminophen (TYLENOL) 500 MG tablet Take by mouth.  Marland Kitchen aspirin EC 81 MG tablet Take 81 mg by mouth daily.  Marland Kitchen atorvastatin (LIPITOR) 20 MG tablet Take 20 mg by mouth daily.  . calcium carbonate (TUMS - DOSED  IN MG ELEMENTAL CALCIUM) 500 MG chewable tablet Chew 1 tablet by mouth daily as needed for indigestion or heartburn.   . calcium citrate-vitamin D (CITRACAL+D) 315-200 MG-UNIT per tablet Take 1 tablet by mouth 2 (two) times daily.  . cetirizine (ZYRTEC) 10 MG tablet Take 10 mg by mouth daily.  . cholecalciferol (VITAMIN D) 1000 units tablet Take 1,000 Units by mouth daily.  . clotrimazole (LOTRIMIN) 1 % cream Apply to both feet and between toes twice daily  . estradiol (ESTRACE) 0.1 MG/GM vaginal cream Place 1 Applicatorful vaginally 2 (two) times a week. Monday and Friday  . ferrous fumarate (HEMOCYTE - 106 MG FE) 325 (106 FE) MG TABS tablet Take 1 tablet by mouth as needed (iron).   . hydrochlorothiazide (MICROZIDE) 12.5 MG capsule Take 1 capsule (12.5 mg total) by mouth daily. Please keep upcoming appt with Dr. Radford Pax in February for future refills. Thank you  . hydrocortisone (ANUSOL-HC) 25 MG suppository Place 25 mg rectally 2 (two) times daily as needed for hemorrhoids or itching.  . indomethacin (INDOCIN) 50 MG capsule Take 50 mg by mouth 2 (two) times daily as needed (pain). Take with food or milk  . Lancets (ONETOUCH ULTRASOFT) lancets 1 each by Other route as directed.   . Misc Natural Products (GLUCOSAMINE-CHONDROITIN SULF PO) Take by mouth.  . Misc Natural Products (OSTEO BI-FLEX ADV DOUBLE  ST PO) Take 1 tablet by mouth daily as needed (knee pain).   . montelukast (SINGULAIR) 10 MG tablet Take 10 mg by mouth at bedtime.   Marland Kitchen omeprazole (PRILOSEC OTC) 20 MG tablet Take 20 mg by mouth daily.  . ONE TOUCH ULTRA TEST test strip 1 each by Other route as directed.   . potassium chloride (MICRO-K) 10 MEQ CR capsule Take 10 mEq by mouth daily.  . ramipril (ALTACE) 5 MG capsule Take 1 capsule (5 mg total) by mouth daily.  Marland Kitchen senna (SENOKOT) 8.6 MG TABS tablet Take 2 tablets by mouth at bedtime as needed for mild constipation.  . triamcinolone (NASACORT) 55 MCG/ACT AERO nasal inhaler Place 2  sprays into the nose daily.     Allergies:   Ciprofloxacin, Penicillins, and Penicillin g   Social History   Socioeconomic History  . Marital status: Widowed    Spouse name: Not on file  . Number of children: Not on file  . Years of education: Not on file  . Highest education level: Not on file  Occupational History  . Occupation: retired  Tobacco Use  . Smoking status: Former Smoker    Quit date: 01/30/1979    Years since quitting: 40.3  . Smokeless tobacco: Never Used  Substance and Sexual Activity  . Alcohol use: No  . Drug use: No  . Sexual activity: Not on file  Other Topics Concern  . Not on file  Social History Narrative   Lives alone with sons close by   Social Determinants of Health   Financial Resource Strain:   . Difficulty of Paying Living Expenses: Not on file  Food Insecurity:   . Worried About Charity fundraiser in the Last Year: Not on file  . Ran Out of Food in the Last Year: Not on file  Transportation Needs:   . Lack of Transportation (Medical): Not on file  . Lack of Transportation (Non-Medical): Not on file  Physical Activity:   . Days of Exercise per Week: Not on file  . Minutes of Exercise per Session: Not on file  Stress:   . Feeling of Stress : Not on file  Social Connections:   . Frequency of Communication with Friends and Family: Not on file  . Frequency of Social Gatherings with Friends and Family: Not on file  . Attends Religious Services: Not on file  . Active Member of Clubs or Organizations: Not on file  . Attends Archivist Meetings: Not on file  . Marital Status: Not on file     Family History: The patient's family history includes Heart attack in her mother; Heart disease in her mother; Lung cancer in her brother; Pancreatic cancer in her brother; Prostate cancer in her father.  ROS:   Please see the history of present illness.    ROS  All other systems reviewed and negative.   EKGs/Labs/Other Studies Reviewed:     The following studies were reviewed today: EKG, outside labs from PCP on [facility]  EKG:  EKG is  ordered today.  The ekg ordered today demonstrates sinus bradycardia at 56bpm with inferior ischemia in the inferolateral leads unchanged from 1 year ago  Recent Labs: No results found for requested labs within last 8760 hours.   Recent Lipid Panel No results found for: CHOL, TRIG, HDL, CHOLHDL, VLDL, LDLCALC, LDLDIRECT  Physical Exam:    VS:  BP 118/82   Pulse (!) 56   Ht 5\' 5"  (1.651 m)  Wt 119 lb (54 kg)   SpO2 97%   BMI 19.80 kg/m     Wt Readings from Last 3 Encounters:  05/16/19 119 lb (54 kg)  03/27/19 121 lb (54.9 kg)  05/15/18 120 lb (54.4 kg)     GEN:  Well nourished, well developed in no acute distress HEENT: Normal NECK: No JVD; No carotid bruits LYMPHATICS: No lymphadenopathy CARDIAC: RRR, no murmurs, rubs, gallops RESPIRATORY:  Clear to auscultation without rales, wheezing or rhonchi  ABDOMEN: Soft, non-tender, non-distended MUSCULOSKELETAL:  No edema; No deformity  SKIN: Warm and dry NEUROLOGIC:  Alert and oriented x 3 PSYCHIATRIC:  Normal affect   ASSESSMENT:    1. Chronic diastolic CHF (congestive heart failure) ([place])   2. Essential hypertension   3. Hypercholesterolemia   4. DM type 2, goal HbA1c < 7% ([facility])   5. Palpitations    PLAN:    In order of problems listed above:  1.  Chronic diastolic CHF -she appears euvolemic on exam today -denies any SOB or LE edema -she has not required any diuretic therapy  2.  HTN -BP controlled -continue  HCTZ 12.5mg  daily and Ramipril 5mg  daily -her creatinine was reviewed from PCP on KPN and was 1.2 and K+ 4.3 in July 2020 -repeat BMET  3.  HLD -LDL goal < 70 -followed by PCP -review of outside labs by PCP on KPN showed an LDL of 125 in July 2020 -continue atorvastatin 20mg  daily -repeat FLP and ALT  4.  DM2 -followed by PCP -HbA1C reviewed from PCP and was 6.1% in July -diet  controlled   Medication Adjustments/Labs and Tests Ordered: Current medicines are reviewed at length with the patient today.  Concerns regarding medicines are outlined above.  Orders Placed This Encounter  Procedures  . EKG 12-Lead   No orders of the defined types were placed in this encounter.   Signed, Fransico Him, MD  05/16/2019 1:34 PM    [place]

## 2019-05-17 LAB — COMPREHENSIVE METABOLIC PANEL
ALT: 11 IU/L (ref 0–32)
AST: 19 IU/L (ref 0–40)
Albumin/Globulin Ratio: 1.6 (ref 1.2–2.2)
Albumin: 4.4 g/dL (ref 3.5–4.6)
Alkaline Phosphatase: 93 IU/L (ref 39–117)
BUN/Creatinine Ratio: 12 (ref 12–28)
BUN: 12 mg/dL (ref 10–36)
Bilirubin Total: 0.3 mg/dL (ref 0.0–1.2)
CO2: 26 mmol/L (ref 20–29)
Calcium: 9.3 mg/dL (ref 8.7–10.3)
Chloride: 96 mmol/L (ref 96–106)
Creatinine, Ser: 1 mg/dL (ref 0.57–1.00)
GFR calc Af Amer: 56 mL/min/{1.73_m2} — ABNORMAL LOW (ref >59)
GFR calc non Af Amer: 49 mL/min/{1.73_m2} — ABNORMAL LOW (ref >59)
Globulin, Total: 2.7 g/dL (ref 1.5–4.5)
Glucose: 107 mg/dL — ABNORMAL HIGH (ref 65–99)
Potassium: 4.8 mmol/L (ref 3.5–5.2)
Sodium: 136 mmol/L (ref 134–144)
Total Protein: 7.1 g/dL (ref 6.0–8.5)

## 2019-05-17 LAB — LIPID PANEL
Chol/HDL Ratio: 3.5 ratio (ref 0.0–4.4)
Cholesterol, Total: 207 mg/dL — ABNORMAL HIGH (ref 100–199)
HDL: 60 mg/dL (ref >39)
LDL Chol Calc (NIH): 135 mg/dL — ABNORMAL HIGH (ref 0–99)
Triglycerides: 65 mg/dL (ref 0–149)
VLDL Cholesterol Cal: 12 mg/dL (ref 5–40)

## 2019-05-20 ENCOUNTER — Other Ambulatory Visit (INDEPENDENT_AMBULATORY_CARE_PROVIDER_SITE_OTHER): Payer: Medicare PPO

## 2019-05-20 DIAGNOSIS — R002 Palpitations: Secondary | ICD-10-CM | POA: Diagnosis not present

## 2019-06-06 ENCOUNTER — Other Ambulatory Visit: Payer: Self-pay | Admitting: Cardiology

## 2019-07-23 DIAGNOSIS — G4700 Insomnia, unspecified: Secondary | ICD-10-CM | POA: Diagnosis not present

## 2019-09-24 ENCOUNTER — Encounter: Payer: Self-pay | Admitting: Podiatry

## 2019-09-24 ENCOUNTER — Ambulatory Visit: Payer: Medicare Other | Admitting: Podiatry

## 2019-09-24 ENCOUNTER — Other Ambulatory Visit: Payer: Self-pay

## 2019-09-24 DIAGNOSIS — B351 Tinea unguium: Secondary | ICD-10-CM | POA: Diagnosis not present

## 2019-09-24 DIAGNOSIS — M79675 Pain in left toe(s): Secondary | ICD-10-CM | POA: Diagnosis not present

## 2019-09-24 DIAGNOSIS — M79674 Pain in right toe(s): Secondary | ICD-10-CM | POA: Diagnosis not present

## 2019-09-24 NOTE — Patient Instructions (Signed)
Diabetes Mellitus and Foot Care Foot care is an important part of your health, especially when you have diabetes. Diabetes may cause you to have problems because of poor blood flow (circulation) to your feet and legs, which can cause your skin to:  Become thinner and drier.  Break more easily.  Heal more slowly.  Peel and crack. You may also have nerve damage (neuropathy) in your legs and feet, causing decreased feeling in them. This means that you may not notice minor injuries to your feet that could lead to more serious problems. Noticing and addressing any potential problems early is the best way to prevent future foot problems. How to care for your feet Foot hygiene  Wash your feet daily with warm water and mild soap. Do not use hot water. Then, pat your feet and the areas between your toes until they are completely dry. Do not soak your feet as this can dry your skin.  Trim your toenails straight across. Do not dig under them or around the cuticle. File the edges of your nails with an emery board or nail file.  Apply a moisturizing lotion or petroleum jelly to the skin on your feet and to dry, brittle toenails. Use lotion that does not contain alcohol and is unscented. Do not apply lotion between your toes. Shoes and socks  Wear clean socks or stockings every day. Make sure they are not too tight. Do not wear knee-high stockings since they may decrease blood flow to your legs.  Wear shoes that fit properly and have enough cushioning. Always look in your shoes before you put them on to be sure there are no objects inside.  To break in new shoes, wear them for just a few hours a day. This prevents injuries on your feet. Wounds, scrapes, corns, and calluses  Check your feet daily for blisters, cuts, bruises, sores, and redness. If you cannot see the bottom of your feet, use a mirror or ask someone for help.  Do not cut corns or calluses or try to remove them with medicine.  If you  find a minor scrape, cut, or break in the skin on your feet, keep it and the skin around it clean and dry. You may clean these areas with mild soap and water. Do not clean the area with peroxide, alcohol, or iodine.  If you have a wound, scrape, corn, or callus on your foot, look at it several times a day to make sure it is healing and not infected. Check for: ? Redness, swelling, or pain. ? Fluid or blood. ? Warmth. ? Pus or a bad smell. General instructions  Do not cross your legs. This may decrease blood flow to your feet.  Do not use heating pads or hot water bottles on your feet. They may burn your skin. If you have lost feeling in your feet or legs, you may not know this is happening until it is too late.  Protect your feet from hot and cold by wearing shoes, such as at the beach or on hot pavement.  Schedule a complete foot exam at least once a year (annually) or more often if you have foot problems. If you have foot problems, report any cuts, sores, or bruises to your health care provider immediately. Contact a health care provider if:  You have a medical condition that increases your risk of infection and you have any cuts, sores, or bruises on your feet.  You have an injury that is not   healing.  You have redness on your legs or feet.  You feel burning or tingling in your legs or feet.  You have pain or cramps in your legs and feet.  Your legs or feet are numb.  Your feet always feel cold.  You have pain around a toenail. Get help right away if:  You have a wound, scrape, corn, or callus on your foot and: ? You have pain, swelling, or redness that gets worse. ? You have fluid or blood coming from the wound, scrape, corn, or callus. ? Your wound, scrape, corn, or callus feels warm to the touch. ? You have pus or a bad smell coming from the wound, scrape, corn, or callus. ? You have a fever. ? You have a red line going up your leg. Summary  Check your feet every day  for cuts, sores, red spots, swelling, and blisters.  Moisturize feet and legs daily.  Wear shoes that fit properly and have enough cushioning.  If you have foot problems, report any cuts, sores, or bruises to your health care provider immediately.  Schedule a complete foot exam at least once a year (annually) or more often if you have foot problems. This information is not intended to replace advice given to you by your health care provider. Make sure you discuss any questions you have with your health care provider. Document Revised: 11/27/2018 Document Reviewed: 04/07/2016 Elsevier Patient Education  2020 Elsevier Inc.  

## 2019-09-29 NOTE — Progress Notes (Signed)
Subjective:  Patient ID: Brenda Lynch, female    DOB: [birth year],  MRN: [id number]  Brenda Lynch presents to clinic today for preventative diabetic foot care and painful thick toenails that are difficult to trim. Pain interferes with ambulation. Aggravating factors include wearing enclosed shoe gear. Pain is relieved with periodic professional debridement..  86 y.o. female presents with the above complaint.  Reports painfully elongated nails to both feet.  Review of Systems: Negative except as noted in the HPI. Past Medical History:  Diagnosis Date  . Allergic rhinitis   . Chronic diastolic CHF (congestive heart failure) ([place])   . CKD (chronic kidney disease), stage II   . Coronary artery disease   . Diabetes mellitus   . Diastolic dysfunction   . Diverticulosis   . Drug-induced bradycardia 01/28/2016  . Hypercholesterolemia   . Hypertension   . Hyponatremia   . Meningioma ([place])   . Osteoarthritis   . Sedentary lifestyle   . Spinal stenosis   . Vertigo, benign positional    Past Surgical History:  Procedure Laterality Date  . BACK SURGERY    . BREAST BIOPSY    . SKIN FULL THICKNESS GRAFT Right 11/08/2017   Procedure: RIGHT SMALL FINGER ABLATION OF NAIL ANDSKIN GRAFT FULL THICKNESS;  Surgeon: Leanora Cover, MD;  Location: [facility];  Service: Orthopedics;  Laterality: Right;  . TONSILLECTOMY      Current Outpatient Medications:  .  acetaminophen (TYLENOL) 500 MG tablet, Take by mouth., Disp: , Rfl:  .  aspirin EC 81 MG tablet, Take 81 mg by mouth daily., Disp: , Rfl:  .  atorvastatin (LIPITOR) 20 MG tablet, Take 20 mg by mouth daily., Disp: , Rfl:  .  calcium carbonate (TUMS - DOSED IN MG ELEMENTAL CALCIUM) 500 MG chewable tablet, Chew 1 tablet by mouth daily as needed for indigestion or heartburn. , Disp: , Rfl:  .  calcium citrate-vitamin D (CITRACAL+D) 315-200 MG-UNIT per tablet, Take 1 tablet by mouth 2 (two) times daily., Disp: , Rfl:  .  cetirizine  (ZYRTEC) 10 MG tablet, Take 10 mg by mouth daily., Disp: , Rfl:  .  cholecalciferol (VITAMIN D) 1000 units tablet, Take 1,000 Units by mouth daily., Disp: , Rfl:  .  clotrimazole (LOTRIMIN) 1 % cream, Apply to both feet and between toes twice daily, Disp: 30 g, Rfl: 1 .  estradiol (ESTRACE) 0.1 MG/GM vaginal cream, Place 1 Applicatorful vaginally 2 (two) times a week. Monday and Friday, Disp: , Rfl:  .  ferrous fumarate (HEMOCYTE - 106 MG FE) 325 (106 FE) MG TABS tablet, Take 1 tablet by mouth as needed (iron). , Disp: , Rfl:  .  hydrochlorothiazide (MICROZIDE) 12.5 MG capsule, TAKE (1) CAPSULE DAILY., Disp: 90 capsule, Rfl: 3 .  hydrocortisone (ANUSOL-HC) 25 MG suppository, Place 25 mg rectally 2 (two) times daily as needed for hemorrhoids or itching., Disp: , Rfl:  .  indomethacin (INDOCIN) 50 MG capsule, Take 50 mg by mouth 2 (two) times daily as needed (pain). Take with food or milk, Disp: , Rfl:  .  Lancets (ONETOUCH ULTRASOFT) lancets, 1 each by Other route as directed. , Disp: , Rfl:  .  Misc Natural Products (GLUCOSAMINE-CHONDROITIN SULF PO), Take by mouth., Disp: , Rfl:  .  Misc Natural Products (OSTEO BI-FLEX ADV DOUBLE ST PO), Take 1 tablet by mouth daily as needed (knee pain). , Disp: , Rfl:  .  montelukast (SINGULAIR) 10 MG tablet, Take 10 mg by mouth at  bedtime. , Disp: , Rfl:  .  omeprazole (PRILOSEC OTC) 20 MG tablet, Take 20 mg by mouth daily., Disp: , Rfl:  .  ONE TOUCH ULTRA TEST test strip, 1 each by Other route as directed. , Disp: , Rfl:  .  potassium chloride (MICRO-K) 10 MEQ CR capsule, Take 10 mEq by mouth daily., Disp: , Rfl:  .  ramipril (ALTACE) 5 MG capsule, Take 1 capsule (5 mg total) by mouth daily., Disp: 30 capsule, Rfl: 11 .  senna (SENOKOT) 8.6 MG TABS tablet, Take 2 tablets by mouth at bedtime as needed for mild constipation., Disp: , Rfl:  .  traZODone (DESYREL) 50 MG tablet, , Disp: , Rfl:  .  triamcinolone (NASACORT) 55 MCG/ACT AERO nasal inhaler, Place 2  sprays into the nose daily., Disp: , Rfl:  Allergies  Allergen Reactions  . Ciprofloxacin     GI intolerance  . Penicillins Hives    Has patient had a PCN reaction causing immediate rash, facial/tongue/throat swelling, SOB or lightheadedness with hypotension: Yes Has patient had a PCN reaction causing severe rash involving mucus membranes or skin necrosis: No Has patient had a PCN reaction that required hospitalization No Has patient had a PCN reaction occurring within the last 10 years: No If all of the above answers are "NO", then may proceed with Cephalosporin use.  Marland Kitchen Penicillin G Other (See Comments)    welps on body welps on body   @[facility] @ Objective:   Constitutional Brenda Lynch is a pleasant 86 y.o. African American female, in NAD.Marland Kitchen AAO x 3.   Vascular Dorsalis pedis pulses palpable bilaterally. Posterior tibial pulses palpable bilaterally. Capillary refill normal to all digits.  No cyanosis or clubbing noted. Pedal hair growth normal.  Neurologic Normal speech. Oriented to person, place, and time. Epicritic sensation to light touch grossly present bilaterally.  Dermatologic Pedal skin with normal turgor, texture and tone b/l.  Nails thick, elongated, dystrophic with pain on palpation x 10 No open wounds. No skin lesions.  Orthopedic: Normal joint ROM without pain or crepitus bilaterally. No visible deformities. No bony tenderness.   Radiographs: None Assessment:  No diagnosis found. Plan:  Patient was evaluated and treated and all questions answered.  Onychomycosis with pain -Nails palliatively debridement as below -Educated on self-care  Procedure: Nail Debridement Rationale: Pain Type of Debridement: manual, sharp debridement. Instrumentation: Nail nipper, rotary burr. Number of Nails: 10 -Continue diabetic foot care principles. -Toenails 1-5 b/l were debrided in length and girth with sterile nail nippers and dremel without iatrogenic bleeding.    -Patient to report any pedal injuries to medical professional immediately. -Patient to continue soft, supportive shoe gear daily. -Patient/POA to call should there be question/concern in the interim.  Return in about 3 months (around 12/25/2019) for diabetic nail trim.  Marzetta Board, DPM

## 2019-10-10 ENCOUNTER — Telehealth: Payer: Self-pay | Admitting: Cardiology

## 2019-10-10 ENCOUNTER — Emergency Department (HOSPITAL_COMMUNITY)
Admission: EM | Admit: 2019-10-10 | Discharge: 2019-10-10 | Disposition: A | Discharge status: Home / Self Care | Payer: Medicare PPO | Attending: Emergency Medicine | Admitting: Emergency Medicine

## 2019-10-10 ENCOUNTER — Encounter (HOSPITAL_COMMUNITY): Payer: Self-pay

## 2019-10-10 ENCOUNTER — Emergency Department (HOSPITAL_COMMUNITY): Payer: Medicare PPO

## 2019-10-10 DIAGNOSIS — N183 Chronic kidney disease, stage 3 unspecified: Secondary | ICD-10-CM | POA: Insufficient documentation

## 2019-10-10 DIAGNOSIS — Z801 Family history of malignant neoplasm of trachea, bronchus and lung: Secondary | ICD-10-CM | POA: Insufficient documentation

## 2019-10-10 DIAGNOSIS — Z87891 Personal history of nicotine dependence: Secondary | ICD-10-CM | POA: Insufficient documentation

## 2019-10-10 DIAGNOSIS — I2510 Atherosclerotic heart disease of native coronary artery without angina pectoris: Secondary | ICD-10-CM | POA: Insufficient documentation

## 2019-10-10 DIAGNOSIS — R0789 Other chest pain: Secondary | ICD-10-CM | POA: Diagnosis not present

## 2019-10-10 DIAGNOSIS — I5032 Chronic diastolic (congestive) heart failure: Secondary | ICD-10-CM | POA: Insufficient documentation

## 2019-10-10 DIAGNOSIS — J984 Other disorders of lung: Secondary | ICD-10-CM | POA: Diagnosis not present

## 2019-10-10 DIAGNOSIS — I700 Atherosclerosis of aorta: Secondary | ICD-10-CM | POA: Diagnosis not present

## 2019-10-10 DIAGNOSIS — Z79899 Other long term (current) drug therapy: Secondary | ICD-10-CM | POA: Insufficient documentation

## 2019-10-10 DIAGNOSIS — I130 Hypertensive heart and chronic kidney disease with heart failure and stage 1 through stage 4 chronic kidney disease, or unspecified chronic kidney disease: Secondary | ICD-10-CM | POA: Diagnosis not present

## 2019-10-10 DIAGNOSIS — Z800 Family history of malignant neoplasm of digestive organs: Secondary | ICD-10-CM | POA: Diagnosis not present

## 2019-10-10 DIAGNOSIS — Z7982 Long term (current) use of aspirin: Secondary | ICD-10-CM | POA: Diagnosis not present

## 2019-10-10 DIAGNOSIS — J439 Emphysema, unspecified: Secondary | ICD-10-CM | POA: Diagnosis not present

## 2019-10-10 DIAGNOSIS — R0602 Shortness of breath: Secondary | ICD-10-CM | POA: Diagnosis not present

## 2019-10-10 DIAGNOSIS — R079 Chest pain, unspecified: Secondary | ICD-10-CM | POA: Diagnosis not present

## 2019-10-10 DIAGNOSIS — E1122 Type 2 diabetes mellitus with diabetic chronic kidney disease: Secondary | ICD-10-CM | POA: Insufficient documentation

## 2019-10-10 LAB — CBC
HCT: 39.7 % (ref 36.0–46.0)
Hemoglobin: 13.1 g/dL (ref 12.0–15.0)
MCH: 32.7 pg (ref 26.0–34.0)
MCHC: 33 g/dL (ref 30.0–36.0)
MCV: 99 fL (ref 80.0–100.0)
Platelets: 263 10*3/uL (ref 150–400)
RBC: 4.01 MIL/uL (ref 3.87–5.11)
RDW: 13.4 % (ref 11.5–15.5)
WBC: 7 10*3/uL (ref 4.0–10.5)
nRBC: 0 % (ref 0.0–0.2)

## 2019-10-10 LAB — BASIC METABOLIC PANEL
Anion gap: 8 (ref 5–15)
BUN: 20 mg/dL (ref 8–23)
CO2: 28 mmol/L (ref 22–32)
Calcium: 9.2 mg/dL (ref 8.9–10.3)
Chloride: 97 mmol/L — ABNORMAL LOW (ref 98–111)
Creatinine, Ser: 1.02 mg/dL — ABNORMAL HIGH (ref 0.44–1.00)
GFR calc Af Amer: 55 mL/min — ABNORMAL LOW (ref >60)
GFR calc non Af Amer: 47 mL/min — ABNORMAL LOW (ref >60)
Glucose, Bld: 109 mg/dL — ABNORMAL HIGH (ref 70–99)
Potassium: 4.2 mmol/L (ref 3.5–5.1)
Sodium: 133 mmol/L — ABNORMAL LOW (ref 135–145)

## 2019-10-10 LAB — TROPONIN I (HIGH SENSITIVITY)
Troponin I (High Sensitivity): 8 ng/L (ref <18)
Troponin I (High Sensitivity): 8 ng/L (ref <18)

## 2019-10-10 LAB — D-DIMER, QUANTITATIVE: D-Dimer, Quant: 2.36 ug/mL-FEU — ABNORMAL HIGH (ref 0.00–0.50)

## 2019-10-10 MED ORDER — SODIUM CHLORIDE 0.9% FLUSH
3.0000 mL | Freq: Once | INTRAVENOUS | Status: AC
  Administered 2019-10-10: 3 mL via INTRAVENOUS

## 2019-10-10 MED ORDER — NITROGLYCERIN 0.4 MG SL SUBL: 0.4000 mg | SUBLINGUAL_TABLET | SUBLINGUAL | Status: DC | PRN

## 2019-10-10 MED ORDER — IOHEXOL 350 MG/ML SOLN
100.0000 mL | Freq: Once | INTRAVENOUS | Status: AC | PRN
  Administered 2019-10-10: 100 mL via INTRAVENOUS

## 2019-10-10 MED ORDER — ALUM & MAG HYDROXIDE-SIMETH 200-200-20 MG/5ML PO SUSP
30.0000 mL | Freq: Once | ORAL | Status: AC
  Administered 2019-10-10: 30 mL via ORAL
  Filled 2019-10-10: qty 30

## 2019-10-10 MED ORDER — ASPIRIN 81 MG PO CHEW
324.0000 mg | CHEWABLE_TABLET | Freq: Once | ORAL | Status: AC
  Administered 2019-10-10: 324 mg via ORAL
  Filled 2019-10-10: qty 4

## 2019-10-10 NOTE — Telephone Encounter (Signed)
Fred the pt's son is calling to inform Dr. Guerry Minors is currently admitted in the hospital due to experiencing CP. He is requesting a nurse call him in regards to this.

## 2019-10-10 NOTE — Telephone Encounter (Signed)
Spoke with the patient's son who states that they are currently in the ER because the patient was having chest pain, difficulty breathing and elevated BP this morning. The son is calling as an Pharmacist, hospital.

## 2019-10-10 NOTE — ED Provider Notes (Signed)
[facility] EMERGENCY DEPARTMENT Provider Note   CSN: [id number] Arrival date & time: 10/10/19  1106     History Chief Complaint  Patient presents with   Chest Pain    Brenda Lynch is a 86 y.o. female.  The history is provided by the patient, medical records and a relative. No language interpreter was used.  Chest Pain  Brenda Lynch is a 86 y.o. female who presents to the Emergency Department complaining of chest pain. She presents the emergency department accompanied by her son for evaluation of chest pain that began around 1030 this morning. She was doing well and ate breakfast and then developed chest pain. She states her pain is located across her entire chest and is described as constant. She does have mild associated shortness of breath. She denies any fevers, nausea, vomiting, abdominal pain, diarrhea, leg swelling or pain. Level V caveat due to dementia. No prior similar symptoms. She lives at home with her son. No known sick contacts. She has been fully vaccinated for COVID-19. She sees Dr. [facility] Pax for history of CHF.    Past Medical History:  Diagnosis Date   Allergic rhinitis    Chronic diastolic CHF (congestive heart failure) ([place])    CKD (chronic kidney disease), stage II    Coronary artery disease    Diabetes mellitus    Diastolic dysfunction    Diverticulosis    Drug-induced bradycardia 01/28/2016   Hypercholesterolemia    Hypertension    Hyponatremia    Meningioma (HCC)    Osteoarthritis    Sedentary lifestyle    Spinal stenosis    Vertigo, benign positional     Patient Active Problem List   Diagnosis Date Noted   Presbycusis of both ears 09/14/2016   Drug-induced bradycardia 01/28/2016   Cephalalgia 09/16/2015   Orthostatic dizziness 09/16/2015   Post-nasal drainage 09/16/2015   SOB (shortness of breath) 07/29/2013   Hypertension    Diastolic dysfunction    Hypercholesterolemia    Diabetes mellitus     Chronic diastolic CHF (congestive heart failure) ([place])     Past Surgical History:  Procedure Laterality Date   BACK SURGERY     BREAST BIOPSY     SKIN FULL THICKNESS GRAFT Right 11/08/2017   Procedure: RIGHT SMALL FINGER ABLATION OF NAIL ANDSKIN GRAFT FULL THICKNESS;  Surgeon: Leanora Cover, MD;  Location: [facility];  Service: Orthopedics;  Laterality: Right;   TONSILLECTOMY       OB History   No obstetric history on file.     Family History  Problem Relation Age of Onset   Heart disease Mother    Heart attack Mother    Prostate cancer Father    Lung cancer Brother    Pancreatic cancer Brother     Social History   Tobacco Use   Smoking status: Former Smoker    Quit date: 01/30/1979    Years since quitting: 40.7   Smokeless tobacco: Never Used  Scientific laboratory technician Use: Never used  Substance Use Topics   Alcohol use: No   Drug use: No    Home Medications Prior to Admission medications   Medication Sig Start Date End Date Taking? Authorizing Provider  acetaminophen (TYLENOL) 500 MG tablet Take 500 mg by mouth every 6 (six) hours as needed (for hadaches or mild pain).    Yes [provider]  aspirin EC 81 MG tablet Take 81 mg by mouth at bedtime.  Yes [provider]  calcium carbonate (TUMS - DOSED IN MG ELEMENTAL CALCIUM) 500 MG chewable tablet Chew 1 tablet by mouth daily as needed for indigestion or heartburn.    Yes [provider]  cetirizine (ZYRTEC) 10 MG tablet Take 10 mg by mouth at bedtime as needed for allergies or rhinitis.    Yes [provider]  Cholecalciferol (VITAMIN D-3 PO) Take 1 capsule by mouth in the morning.   Yes [provider]  clotrimazole (LOTRIMIN) 1 % cream Apply to both feet and between toes twice daily Patient taking differently: Apply 1 application topically See admin instructions. Apply to both feet and between toes twice daily 02/05/18  Yes Galaway, Stephani Police, DPM  Dextromethorphan-guaiFENesin 10-200 MG CAPS Take 1 capsule by mouth every 6 (six) hours as needed (for coughing).   Yes [provider]  diphenhydrAMINE (BENADRYL) 25 mg capsule Take 25 mg by mouth in the morning.   Yes [provider]  ferrous fumarate (HEMOCYTE - 106 MG FE) 325 (106 FE) MG TABS tablet Take 1 tablet by mouth in the morning.    Yes [provider]  hydrochlorothiazide (MICROZIDE) 12.5 MG capsule TAKE (1) CAPSULE DAILY. Patient taking differently: Take 12.5 mg by mouth daily.  06/06/19  Yes Turner, Eber Hong, MD  Misc Natural Products (OSTEO BI-FLEX ADV DOUBLE ST PO) Take 1 tablet by mouth daily.    Yes [provider]  montelukast (SINGULAIR) 10 MG tablet Take 10 mg by mouth at bedtime.  12/12/12  Yes [provider]  omeprazole (PRILOSEC OTC) 20 MG tablet Take 20 mg by mouth daily before breakfast.    Yes [provider]  Polyethyl Glyc-Propyl Glyc PF (SYSTANE HYDRATION PF) 0.4-0.3 % SOLN Place 1 drop into both eyes 3 (three) times daily as needed (for itching or irritation).   Yes [provider]  polyethylene glycol powder (GLYCOLAX/MIRALAX) 17 GM/SCOOP powder Take 8.5 g by mouth daily as needed for mild constipation (MIX AND DRINK).   Yes [provider]  potassium chloride (MICRO-K) 10 MEQ CR capsule Take 10 mEq by mouth in the morning.  07/17/14  Yes [provider]  ramipril (ALTACE) 10 MG capsule Take 10 mg by mouth in the morning.  10/06/19  Yes [provider]  Saline (SIMPLY SALINE) 0.9 % AERS Place 1 spray into both nostrils as needed (for congestion).   Yes [provider]  senna (SENOKOT) 8.6 MG TABS tablet Take 1 tablet by mouth at bedtime as needed for mild constipation.    Yes [provider]  traZODone (DESYREL) 50 MG tablet Take 25 mg by mouth at bedtime.  09/08/19  Yes [provider]  atorvastatin (LIPITOR) 20 MG tablet Take 20 mg by mouth  daily. Patient not taking: Reported on 10/10/2019 07/17/14   [provider]  calcium citrate-vitamin D (CITRACAL+D) 315-200 MG-UNIT per tablet Take 1 tablet by mouth 2 (two) times daily.    [provider]  estradiol (ESTRACE) 0.1 MG/GM vaginal cream Place 1 Applicatorful vaginally 2 (two) times a week. Monday and Friday Patient not taking: Reported on 10/10/2019    [provider]  hydrocortisone (ANUSOL-HC) 25 MG suppository Place 25 mg rectally 2 (two) times daily as needed for hemorrhoids or itching. Patient not taking: Reported on 10/10/2019    [provider]  indomethacin (INDOCIN) 50 MG capsule Take 50 mg by mouth 2 (two) times daily as needed (pain). Take with food or milk Patient not  taking: Reported on 10/10/2019    [provider]  Lancets [facility] ULTRASOFT) lancets 1 each by Other route as directed.  05/01/14   [provider]  ONE TOUCH ULTRA TEST test strip 1 each by Other route as directed.  06/21/14   [provider]  ramipril (ALTACE) 5 MG capsule Take 1 capsule (5 mg total) by mouth daily. Patient not taking: Reported on 10/10/2019 02/14/16   Sueanne Margarita, MD  triamcinolone (NASACORT) 55 MCG/ACT AERO nasal inhaler Place 2 sprays into the nose daily. Patient not taking: Reported on 10/10/2019    [provider]    Allergies    Ciprofloxacin, Penicillins, and Penicillin g  Review of Systems   Review of Systems  Cardiovascular: Positive for chest pain.  All other systems reviewed and are negative.   Physical Exam Updated Vital Signs BP (!) 168/75    Pulse 79    Temp 98.4 F (36.9 C) (Oral)    Resp 22    Ht 5\' 6"  (1.676 m)    SpO2 100%    BMI 19.21 kg/m   Physical Exam Vitals and nursing note reviewed.  Constitutional:      Appearance: She is well-developed.  HENT:     Head: Normocephalic and atraumatic.  Cardiovascular:     Rate and Rhythm: Normal rate and regular rhythm.     Heart sounds: No  murmur heard.   Pulmonary:     Effort: Pulmonary effort is normal. No respiratory distress.     Comments: Fine crackles in all lung fields bilaterally Abdominal:     Palpations: Abdomen is soft.     Tenderness: There is no abdominal tenderness. There is no guarding or rebound.  Musculoskeletal:        General: No tenderness.  Skin:    General: Skin is warm and dry.  Neurological:     Mental Status: She is alert.     Comments: Awake, alert and oriented to person. Moderately confused. Moves all extremities symmetrically.  Psychiatric:        Behavior: Behavior normal.     ED Results / Procedures / Treatments   Labs (all labs ordered are listed, but only abnormal results are displayed) Labs Reviewed  BASIC METABOLIC PANEL - Abnormal; Notable for the following components:      Result Value   Sodium 133 (*)    Chloride 97 (*)    Glucose, Bld 109 (*)    Creatinine, Ser 1.02 (*)    GFR calc non Af Amer 47 (*)    GFR calc Af Amer 55 (*)    All other components within normal limits  D-DIMER, QUANTITATIVE (NOT AT [facility]) - Abnormal; Notable for the following components:   D-Dimer, Quant 2.36 (*)    All other components within normal limits  CBC  TROPONIN I (HIGH SENSITIVITY)  TROPONIN I (HIGH SENSITIVITY)    EKG EKG Interpretation  Date/Time:  Friday October 10 2019 11:18:56 EDT Ventricular Rate:  64 PR Interval:  172 QRS Duration: 64 QT Interval:  412 QTC Calculation: 425 R Axis:   50 Text Interpretation: Normal sinus rhythm T wave abnormality, consider inferolateral ischemia Abnormal ECG Interpretation limited secondary to artifact no prior available for comparison Confirmed by Quintella Reichert [phone]) on 10/10/2019 4:13:18 PM   Radiology DG Chest 2 View  Result Date: 10/10/2019 CLINICAL DATA:  Substernal chest pain.  Shortness of breath. EXAM: CHEST - 2 VIEW COMPARISON:  07/08/2016. FINDINGS: Mediastinum and hilar structures normal. Heart  size normal. Mild left mid lung  field and left base infiltrate. No pleural effusion or pneumothorax. IMPRESSION: Mild left mid lung field and left base infiltrate. Electronically Signed   By: Marcello Moores  Register   On: 10/10/2019 11:49   CT Angio Chest PE W/Cm &/Or Wo Cm  Result Date: 10/10/2019 CLINICAL DATA:  Positive D-dimer, substernal chest pain, shortness of breath EXAM: CT ANGIOGRAPHY CHEST WITH CONTRAST TECHNIQUE: Multidetector CT imaging of the chest was performed using the standard protocol during bolus administration of intravenous contrast. Multiplanar CT image reconstructions and MIPs were obtained to evaluate the vascular anatomy. CONTRAST:  177mL OMNIPAQUE IOHEXOL 350 MG/ML SOLN COMPARISON:  10/10/2019 FINDINGS: Cardiovascular: This is a technically adequate evaluation of the pulmonary vasculature. No filling defects or pulmonary emboli. Heart is unremarkable without pericardial effusion. Normal caliber of the thoracic aorta. Moderate atherosclerosis. Mediastinum/Nodes: No enlarged mediastinal, hilar, or axillary lymph nodes. Thyroid gland, trachea, and esophagus demonstrate no significant findings. Lungs/Pleura: Diffuse emphysema, with scattered areas of subpleural scarring most pronounced at the lung bases. Bullous changes are seen at the apices. No acute airspace disease, effusion, or pneumothorax. Central airways are patent. Upper Abdomen: No acute abnormality. Musculoskeletal: No acute or destructive bony lesions. Moderate mid to lower thoracic spondylosis. Reconstructed images demonstrate no additional findings. Review of the MIP images confirms the above findings. IMPRESSION: 1. No evidence of pulmonary embolus. 2. No acute airspace disease. 3. Aortic Atherosclerosis (ICD10-I70.0) and Emphysema (ICD10-J43.9). Electronically Signed   By: Randa Ngo M.D.   On: 10/10/2019 20:33    Procedures Procedures (including critical care time)  Medications Ordered in ED Medications  nitroGLYCERIN (NITROSTAT) SL tablet 0.4 mg  (has no administration in time range)  sodium chloride flush (NS) 0.9 % injection 3 mL (3 mLs Intravenous Given 10/10/19 1758)  aspirin chewable tablet 324 mg (324 mg Oral Given 10/10/19 1739)  alum & mag hydroxide-simeth (MAALOX/MYLANTA) 200-200-20 MG/5ML suspension 30 mL (30 mLs Oral Given 10/10/19 1739)  iohexol (OMNIPAQUE) 350 MG/ML injection 100 mL (100 mLs Intravenous Contrast Given 10/10/19 2027)    ED Course  I have reviewed the triage vital signs and the nursing notes.  Pertinent labs & imaging results that were available during my care of the patient were reviewed by me and considered in my medical decision making (see chart for details).    MDM Rules/Calculators/A&P                         Patient with history of dementia, CHF here for evaluation of chest pain. She is non-toxic appearing on evaluation. EKG abnormal without priors to viewing the chart. But on prior written descriptions of EKGs it sounds to be unchanged. Troponin is negative times two. D dimer was elevated and a CTA was obtained. CTA is negative for PE, pneumonia. Presentation is not consistent with acute CHF. On repeat assessment during her ED stay patient reports feeling improved. Son reports concern for possible gas or reflux contributing to her symptoms as well as anxiety. Discussed with patient and son home care for chest pain of unknown etiology. Discussed importance of outpatient follow-up and return precautions.  Final Clinical Impression(s) / ED Diagnoses Final diagnoses:  Atypical chest pain    Rx / DC Orders ED Discharge Orders    None       Quintella Reichert, MD 10/10/19 2135

## 2019-10-10 NOTE — ED Triage Notes (Signed)
Pt arrives pov with reports of substernal chest pain with some associated shob onset today. Pt states it feels like she is choking.

## 2019-10-22 DIAGNOSIS — N183 Chronic kidney disease, stage 3 unspecified: Secondary | ICD-10-CM | POA: Diagnosis not present

## 2019-10-22 DIAGNOSIS — D329 Benign neoplasm of meninges, unspecified: Secondary | ICD-10-CM | POA: Diagnosis not present

## 2019-10-22 DIAGNOSIS — I10 Essential (primary) hypertension: Secondary | ICD-10-CM | POA: Diagnosis not present

## 2019-10-22 DIAGNOSIS — M1990 Unspecified osteoarthritis, unspecified site: Secondary | ICD-10-CM | POA: Diagnosis not present

## 2019-10-22 DIAGNOSIS — E1122 Type 2 diabetes mellitus with diabetic chronic kidney disease: Secondary | ICD-10-CM | POA: Diagnosis not present

## 2019-10-22 DIAGNOSIS — Z0000 Encounter for general adult medical examination without abnormal findings: Secondary | ICD-10-CM | POA: Diagnosis not present

## 2019-10-22 DIAGNOSIS — Z1389 Encounter for screening for other disorder: Secondary | ICD-10-CM | POA: Diagnosis not present

## 2019-10-22 DIAGNOSIS — K219 Gastro-esophageal reflux disease without esophagitis: Secondary | ICD-10-CM | POA: Diagnosis not present

## 2019-10-22 DIAGNOSIS — E7800 Pure hypercholesterolemia, unspecified: Secondary | ICD-10-CM | POA: Diagnosis not present

## 2019-10-22 DIAGNOSIS — I5030 Unspecified diastolic (congestive) heart failure: Secondary | ICD-10-CM | POA: Diagnosis not present

## 2019-10-22 DIAGNOSIS — J309 Allergic rhinitis, unspecified: Secondary | ICD-10-CM | POA: Diagnosis not present

## 2019-11-11 ENCOUNTER — Other Ambulatory Visit: Payer: Self-pay

## 2019-11-11 ENCOUNTER — Emergency Department (HOSPITAL_COMMUNITY): Payer: Medicare PPO

## 2019-11-11 ENCOUNTER — Encounter (HOSPITAL_COMMUNITY): Payer: Self-pay | Admitting: Emergency Medicine

## 2019-11-11 ENCOUNTER — Telehealth: Payer: Self-pay | Admitting: Cardiology

## 2019-11-11 ENCOUNTER — Emergency Department (HOSPITAL_COMMUNITY)
Admission: EM | Admit: 2019-11-11 | Discharge: 2019-11-11 | Disposition: A | Discharge status: Home / Self Care | Payer: Medicare PPO | Attending: Emergency Medicine | Admitting: Emergency Medicine

## 2019-11-11 DIAGNOSIS — J0100 Acute maxillary sinusitis, unspecified: Secondary | ICD-10-CM | POA: Diagnosis not present

## 2019-11-11 DIAGNOSIS — Z5321 Procedure and treatment not carried out due to patient leaving prior to being seen by health care provider: Secondary | ICD-10-CM | POA: Insufficient documentation

## 2019-11-11 DIAGNOSIS — R29810 Facial weakness: Secondary | ICD-10-CM | POA: Insufficient documentation

## 2019-11-11 DIAGNOSIS — G939 Disorder of brain, unspecified: Secondary | ICD-10-CM | POA: Diagnosis not present

## 2019-11-11 DIAGNOSIS — F0390 Unspecified dementia without behavioral disturbance: Secondary | ICD-10-CM | POA: Insufficient documentation

## 2019-11-11 DIAGNOSIS — G319 Degenerative disease of nervous system, unspecified: Secondary | ICD-10-CM | POA: Diagnosis not present

## 2019-11-11 DIAGNOSIS — D329 Benign neoplasm of meninges, unspecified: Secondary | ICD-10-CM | POA: Diagnosis not present

## 2019-11-11 LAB — PROTIME-INR
INR: 1 (ref 0.8–1.2)
Prothrombin Time: 12.8 seconds (ref 11.4–15.2)

## 2019-11-11 LAB — COMPREHENSIVE METABOLIC PANEL
ALT: 14 U/L (ref 0–44)
AST: 19 U/L (ref 15–41)
Albumin: 3.5 g/dL (ref 3.5–5.0)
Alkaline Phosphatase: 80 U/L (ref 38–126)
Anion gap: 8 (ref 5–15)
BUN: 16 mg/dL (ref 8–23)
CO2: 28 mmol/L (ref 22–32)
Calcium: 8.9 mg/dL (ref 8.9–10.3)
Chloride: 99 mmol/L (ref 98–111)
Creatinine, Ser: 1.14 mg/dL — ABNORMAL HIGH (ref 0.44–1.00)
GFR calc Af Amer: 48 mL/min — ABNORMAL LOW (ref >60)
GFR calc non Af Amer: 41 mL/min — ABNORMAL LOW (ref >60)
Glucose, Bld: 115 mg/dL — ABNORMAL HIGH (ref 70–99)
Potassium: 4.3 mmol/L (ref 3.5–5.1)
Sodium: 135 mmol/L (ref 135–145)
Total Bilirubin: 0.6 mg/dL (ref 0.3–1.2)
Total Protein: 6.6 g/dL (ref 6.5–8.1)

## 2019-11-11 LAB — DIFFERENTIAL
Abs Immature Granulocytes: 0.02 10*3/uL (ref 0.00–0.07)
Basophils Absolute: 0.1 10*3/uL (ref 0.0–0.1)
Basophils Relative: 1 %
Eosinophils Absolute: 0.1 10*3/uL (ref 0.0–0.5)
Eosinophils Relative: 2 %
Immature Granulocytes: 0 %
Lymphocytes Relative: 23 %
Lymphs Abs: 1.6 10*3/uL (ref 0.7–4.0)
Monocytes Absolute: 0.5 10*3/uL (ref 0.1–1.0)
Monocytes Relative: 7 %
Neutro Abs: 4.7 10*3/uL (ref 1.7–7.7)
Neutrophils Relative %: 67 %

## 2019-11-11 LAB — CBC
HCT: 37.2 % (ref 36.0–46.0)
Hemoglobin: 12.3 g/dL (ref 12.0–15.0)
MCH: 32 pg (ref 26.0–34.0)
MCHC: 33.1 g/dL (ref 30.0–36.0)
MCV: 96.9 fL (ref 80.0–100.0)
Platelets: 241 10*3/uL (ref 150–400)
RBC: 3.84 MIL/uL — ABNORMAL LOW (ref 3.87–5.11)
RDW: 13.2 % (ref 11.5–15.5)
WBC: 7 10*3/uL (ref 4.0–10.5)
nRBC: 0 % (ref 0.0–0.2)

## 2019-11-11 LAB — APTT: aPTT: 32 seconds (ref 24–36)

## 2019-11-11 MED ORDER — SODIUM CHLORIDE 0.9% FLUSH
3.0000 mL | Freq: Once | INTRAVENOUS | Status: DC
Start: 2019-11-11 — End: 2019-11-12

## 2019-11-11 NOTE — Telephone Encounter (Signed)
Patient's son states the right side of the patients face is drooping. He states she is not having any weakness on the right side of her body. He states he thinks she may have suffered a stroke at some point, but is not sure when. He states she does not have any other symptoms. He states her BP has been ranging 129/69 on 11/04/2019 and 128/74 on 10/30/2019. Please advise.

## 2019-11-11 NOTE — Telephone Encounter (Signed)
Please have son call 911 and take patient to ER

## 2019-11-11 NOTE — Telephone Encounter (Signed)
Spoke with son and pt currently having facial drooping.  He sent pic to a nurse friend of his and she told him it looked like she had had a stroke sometime.  No right side paralysis, no slurred speech.  Son unsure of when symptoms started.  Advised it would still be appropriate to get pt to ER for proper scans and eval.  Advised still concern for possible clotting but may not be completely obstructing the area.  Son agreeable to plan.  Will send to Dr. [facility] Pax to make her aware.

## 2019-11-11 NOTE — Telephone Encounter (Signed)
Pt already at hospital

## 2019-11-11 NOTE — ED Triage Notes (Signed)
Patient arrives to ED with her son. Per son, pt has hx of dementia. Per son he noticed that pt had a facial droop and does not have any idea when it started. The facial droop could be a couple weeks to months when it occurred. They are here today because a nurse friend stated that they need to get checked out.

## 2019-12-10 ENCOUNTER — Other Ambulatory Visit: Payer: Self-pay | Admitting: Physician Assistant

## 2019-12-10 ENCOUNTER — Ambulatory Visit
Admission: RE | Admit: 2019-12-10 | Discharge: 2019-12-10 | Disposition: A | Discharge status: Home / Self Care | Payer: Medicare PPO | Source: Ambulatory Visit | Attending: Physician Assistant | Admitting: Physician Assistant

## 2019-12-10 DIAGNOSIS — I700 Atherosclerosis of aorta: Secondary | ICD-10-CM | POA: Diagnosis not present

## 2019-12-10 DIAGNOSIS — R0989 Other specified symptoms and signs involving the circulatory and respiratory systems: Secondary | ICD-10-CM | POA: Diagnosis not present

## 2019-12-10 DIAGNOSIS — R531 Weakness: Secondary | ICD-10-CM | POA: Diagnosis not present

## 2019-12-23 ENCOUNTER — Ambulatory Visit: Payer: Medicare PPO

## 2019-12-24 ENCOUNTER — Other Ambulatory Visit: Payer: Self-pay

## 2019-12-24 ENCOUNTER — Encounter: Payer: Self-pay | Admitting: Podiatry

## 2019-12-24 ENCOUNTER — Ambulatory Visit: Payer: Medicare PPO | Admitting: Podiatry

## 2019-12-24 DIAGNOSIS — B351 Tinea unguium: Secondary | ICD-10-CM | POA: Diagnosis not present

## 2019-12-24 DIAGNOSIS — M79674 Pain in right toe(s): Secondary | ICD-10-CM

## 2019-12-24 DIAGNOSIS — M79675 Pain in left toe(s): Secondary | ICD-10-CM

## 2019-12-28 NOTE — Progress Notes (Signed)
Subjective:  Patient ID: Brenda Lynch, female    DOB: 1923/04/01,  MRN: [id number]  Brenda Lynch presents to clinic today for preventative diabetic foot care and painful thick toenails that are difficult to trim. Pain interferes with ambulation. Aggravating factors include wearing enclosed shoe gear. Pain is relieved with periodic professional debridement..  She voices no new pedal problems on today's visit.  Review of Systems: Negative except as noted in the HPI. Past Medical History:  Diagnosis Date  . Allergic rhinitis   . Chronic diastolic CHF (congestive heart failure) ([place])   . CKD (chronic kidney disease), stage II   . Coronary artery disease   . Diabetes mellitus   . Diastolic dysfunction   . Diverticulosis   . Drug-induced bradycardia 01/28/2016  . Hypercholesterolemia   . Hypertension   . Hyponatremia   . Meningioma ([place])   . Osteoarthritis   . Sedentary lifestyle   . Spinal stenosis   . Vertigo, benign positional    Past Surgical History:  Procedure Laterality Date  . BACK SURGERY    . BREAST BIOPSY    . SKIN FULL THICKNESS GRAFT Right 11/08/2017   Procedure: RIGHT SMALL FINGER ABLATION OF NAIL ANDSKIN GRAFT FULL THICKNESS;  Surgeon: Leanora Cover, MD;  Location: [facility];  Service: Orthopedics;  Laterality: Right;  . TONSILLECTOMY      Current Outpatient Medications:  .  acetaminophen (TYLENOL) 500 MG tablet, Take 500 mg by mouth every 6 (six) hours as needed (for hadaches or mild pain). , Disp: , Rfl:  .  aspirin EC 81 MG tablet, Take 81 mg by mouth at bedtime. , Disp: , Rfl:  .  atorvastatin (LIPITOR) 20 MG tablet, Take 20 mg by mouth daily. , Disp: , Rfl:  .  calcium carbonate (TUMS - DOSED IN MG ELEMENTAL CALCIUM) 500 MG chewable tablet, Chew 1 tablet by mouth daily as needed for indigestion or heartburn. , Disp: , Rfl:  .  calcium citrate-vitamin D (CITRACAL+D) 315-200 MG-UNIT per tablet, Take 1 tablet by mouth 2 (two) times daily., Disp:  , Rfl:  .  cetirizine (ZYRTEC) 10 MG tablet, Take 10 mg by mouth at bedtime as needed for allergies or rhinitis. , Disp: , Rfl:  .  Cholecalciferol (VITAMIN D-3 PO), Take 1 capsule by mouth in the morning., Disp: , Rfl:  .  clotrimazole (LOTRIMIN) 1 % cream, Apply to both feet and between toes twice daily (Patient taking differently: Apply 1 application topically See admin instructions. Apply to both feet and between toes twice daily), Disp: 30 g, Rfl: 1 .  Dextromethorphan-guaiFENesin 10-200 MG CAPS, Take 1 capsule by mouth every 6 (six) hours as needed (for coughing)., Disp: , Rfl:  .  diphenhydrAMINE (BENADRYL) 25 mg capsule, Take 25 mg by mouth in the morning., Disp: , Rfl:  .  estradiol (ESTRACE) 0.1 MG/GM vaginal cream, Place 1 Applicatorful vaginally 2 (two) times a week. Monday and Friday , Disp: , Rfl:  .  ferrous fumarate (HEMOCYTE - 106 MG FE) 325 (106 FE) MG TABS tablet, Take 1 tablet by mouth in the morning. , Disp: , Rfl:  .  hydrochlorothiazide (MICROZIDE) 12.5 MG capsule, TAKE (1) CAPSULE DAILY. (Patient taking differently: Take 12.5 mg by mouth daily. ), Disp: 90 capsule, Rfl: 3 .  hydrocortisone (ANUSOL-HC) 25 MG suppository, Place 25 mg rectally 2 (two) times daily as needed for hemorrhoids or itching. , Disp: , Rfl:  .  indomethacin (INDOCIN) 50 MG capsule, Take 50  mg by mouth 2 (two) times daily as needed (pain). Take with food or milk , Disp: , Rfl:  .  Lancets (ONETOUCH ULTRASOFT) lancets, 1 each by Other route as directed. , Disp: , Rfl:  .  Misc Natural Products (OSTEO BI-FLEX ADV DOUBLE ST PO), Take 1 tablet by mouth daily. , Disp: , Rfl:  .  montelukast (SINGULAIR) 10 MG tablet, Take 10 mg by mouth at bedtime. , Disp: , Rfl:  .  omeprazole (PRILOSEC OTC) 20 MG tablet, Take 20 mg by mouth daily before breakfast. , Disp: , Rfl:  .  ONE TOUCH ULTRA TEST test strip, 1 each by Other route as directed. , Disp: , Rfl:  .  Polyethyl Glyc-Propyl Glyc PF (SYSTANE HYDRATION PF)  0.4-0.3 % SOLN, Place 1 drop into both eyes 3 (three) times daily as needed (for itching or irritation)., Disp: , Rfl:  .  polyethylene glycol powder (GLYCOLAX/MIRALAX) 17 GM/SCOOP powder, Take 8.5 g by mouth daily as needed for mild constipation (MIX AND DRINK)., Disp: , Rfl:  .  potassium chloride (MICRO-K) 10 MEQ CR capsule, Take 10 mEq by mouth in the morning. , Disp: , Rfl:  .  ramipril (ALTACE) 10 MG capsule, Take 10 mg by mouth in the morning. , Disp: , Rfl:  .  ramipril (ALTACE) 5 MG capsule, Take 1 capsule (5 mg total) by mouth daily., Disp: 30 capsule, Rfl: 11 .  Saline (SIMPLY SALINE) 0.9 % AERS, Place 1 spray into both nostrils as needed (for congestion)., Disp: , Rfl:  .  senna (SENOKOT) 8.6 MG TABS tablet, Take 1 tablet by mouth at bedtime as needed for mild constipation. , Disp: , Rfl:  .  traZODone (DESYREL) 50 MG tablet, Take 25 mg by mouth at bedtime. , Disp: , Rfl:  .  triamcinolone (NASACORT) 55 MCG/ACT AERO nasal inhaler, Place 2 sprays into the nose daily. , Disp: , Rfl:  Allergies  Allergen Reactions  . Ciprofloxacin     GI intolerance  . Penicillins Hives and Other (See Comments)    Welts, also Has patient had a PCN reaction causing immediate rash, facial/tongue/throat swelling, SOB or lightheadedness with hypotension: Yes Has patient had a PCN reaction causing severe rash involving mucus membranes or skin necrosis: No Has patient had a PCN reaction that required hospitalization No Has patient had a PCN reaction occurring within the last 10 years: No If all of the above answers are "NO", then may proceed with Cephalosporin use.  Marland Kitchen Penicillin G Hives and Other (See Comments)    Welts on the body   @SHSOC @ Objective:   Constitutional Brenda Lynch is a pleasant 86 y.o. African American female, in NAD.Marland Kitchen AAO x 3.   Vascular Dorsalis pedis pulses palpable bilaterally. Posterior tibial pulses palpable bilaterally.Capillary refill normal to all digits. No cyanosis or  clubbing noted. Pedal hair growth sparse b/l.  Neurologic Normal speech. Oriented to person, place, and time. Epicritic sensation to light touch grossly present bilaterally.  Dermatologic Pedal skin with normal turgor, texture and tone b/l.  Toenails discolored, thick, elongated, dystrophic with pain on palpation x 10. No open wounds. No skin lesions.  Orthopedic: Normal joint ROM without pain or crepitus bilaterally. No visible deformities. No bony tenderness.   Radiographs: None Assessment:   1. Pain due to onychomycosis of toenails of both feet    Plan:  Patient was evaluated and treated and all questions answered.  Onychomycosis with pain -Nails palliatively debridement as below -Educated on self-care  Procedure: Nail Debridement Rationale: Pain Type of Debridement: manual, sharp debridement. Instrumentation: Nail nipper, rotary burr. Number of Nails: 10 -Continue diabetic foot care principles. -Toenails 1-5 b/l were debrided in length and girth with sterile nail nippers and dremel without iatrogenic bleeding.  -Patient to report any pedal injuries to medical professional immediately. -Patient to continue soft, supportive shoe gear daily. -Patient/POA to call should there be question/concern in the interim.  Return in about 3 months (around 03/25/2020).  Marzetta Board, DPM

## 2020-01-06 ENCOUNTER — Ambulatory Visit: Payer: Medicare PPO | Attending: Internal Medicine

## 2020-01-06 DIAGNOSIS — Z23 Encounter for immunization: Secondary | ICD-10-CM

## 2020-01-06 NOTE — Progress Notes (Signed)
   Covid-19 Vaccination Clinic  Name:  Brenda Lynch    MRN: [id number] DOB: 1923-12-29  01/06/2020  Brenda Lynch was observed post Covid-19 immunization for 15 minutes without incident. She was provided with Vaccine Information Sheet and instruction to access the V-Safe system.   Brenda Lynch was instructed to call 911 with any severe reactions post vaccine: Marland Kitchen Difficulty breathing  . Swelling of face and throat  . A fast heartbeat  . A bad rash all over body  . Dizziness and weakness

## 2020-01-14 DIAGNOSIS — J3489 Other specified disorders of nose and nasal sinuses: Secondary | ICD-10-CM | POA: Diagnosis not present

## 2020-01-14 DIAGNOSIS — M542 Cervicalgia: Secondary | ICD-10-CM | POA: Diagnosis not present

## 2020-01-14 DIAGNOSIS — Z23 Encounter for immunization: Secondary | ICD-10-CM | POA: Diagnosis not present

## 2020-01-26 DIAGNOSIS — M542 Cervicalgia: Secondary | ICD-10-CM | POA: Diagnosis not present

## 2020-02-16 NOTE — Progress Notes (Signed)
[id number]    Provider:  Dr Jaynee Eagles Requesting Provider: [name], MD Primary Care Provider:  [name], MD  CC:  headache  HPI:  Brenda Lynch is a 86 y.o. female here as requested by [name], MD for headache. PMHx HTN, CHF, DM, dementia, orthostatic hypotension, hypercholesterolemia, drug-induced bradycardia, cephalalgia, protein-calorie malnutrition, lumbar spinal stenosis, osteopenia, OA, chronic pain, BPV, CKD, anemia, I reviewed Dr. Sherilyn Cooter notes: There is no information in the provided notes with regard to the chief complaint.  I do see notes from August 24 where she reported face drooping, they sent patient to the emergency room for evaluation.  When they got to the emergency room son did not really know when the facial droop started, could be couple weeks or couple months.  I do not see any further notes, there is a CT of the head without acute events.  But I do not see any history of headaches in epic so I have no prior information regarding chief complaint that patient has been sent for.  She saw ophthalmology in March of this year and their assessment was diabetes mellitus without complications, pseudophakia both eyes, dry eyes, myopia with astigmatism and presbyopia bilateral.  She called them in July for complaints of not being able to focus, but she had the same complaints when I saw her in March, and they discussed diabetes dry eyes with son and they advised her to wear her glasses which she was not doing all the time apparently she has dementia and does not appear to quite understand.  She hs headaches on the top of her head. The headaches get better after she eats. It is on the top of the head. A compression strip helps with the headaches. She has had headaches for years but more sinus related, this is  Anew headache over the last 3 weeks, she gets it every morning when waking up, it doesn't wake her up in the middle of the night. It gets better  on its own. She takes tylenol for other pain. She has a lot of sinus issues and they have gone to an ENT and Dr. Lysle Rubens and they have tried zyrtec and simply saline may help sinuses but not the headache, The headache can throb but usually a dull pain, it doesn't stop her from doing things, headache can be ild to moderate and a tylenol. No vision changes with the headaches. No pain in the temples. They have checked her glucose in the morning and it has been 96. No jaw pain or fever. She can nap a lot in the days, she is fatigued, she wakes up with daily headaches.  Reviewed notes, labs and imaging from outside physicians, which showed:  10/22/2019: Hemoglobin A1c 5.9, LDL 116, CMP with low sodium 132, BUN 16, creatinine 1.06, GFR 58, CBC unremarkable,  CT head 11/11/2019: Brain: Generalized atrophy without hydrocephalus. Negative for acute infarct or hemorrhage.  15 mm calcified mass along the anterior falx extending on both sides of the falx. This is compatible with meningioma and is unchanged from the prior study. No brain edema.  Vascular: Negative for hyperdense vessel  Skull: Negative  Sinuses/Orbits: Mucoperiosteal thickening left sphenoid sinus. Mild mucosal edema right maxillary sinus. Mucosal edema right frontal sinus. Bilateral cataract extraction  Other: None  IMPRESSION: No acute abnormality.  Generalized atrophy  15 mm meningioma along the anterior falx unchanged since 2015.  Review of Systems: Patient complains of symptoms per HPI as well as the following  symptoms: memory loss, confusion, headache, depression, anxiety, feeling hot, feeling cold, snoring, joint pain, aching muscles. Pertinent negatives and positives per HPI. All others negative.   Social History   Socioeconomic History  . Marital status: Widowed    Spouse name: Not on file  . Number of children: Not on file  . Years of education: Not on file  . Highest education level: Not on file  Occupational  History  . Occupation: retired  Tobacco Use  . Smoking status: Former Smoker    Quit date: 01/30/1979    Years since quitting: 41.0  . Smokeless tobacco: Never Used  Vaping Use  . Vaping Use: Never used  Substance and Sexual Activity  . Alcohol use: No  . Drug use: No  . Sexual activity: Not on file  Other Topics Concern  . Not on file  Social History Narrative   Lives alone with sons close by   Social Determinants of Health   Financial Resource Strain:   . Difficulty of Paying Living Expenses: Not on file  Food Insecurity:   . Worried About Charity fundraiser in the Last Year: Not on file  . Ran Out of Food in the Last Year: Not on file  Transportation Needs:   . Lack of Transportation (Medical): Not on file  . Lack of Transportation (Non-Medical): Not on file  Physical Activity:   . Days of Exercise per Week: Not on file  . Minutes of Exercise per Session: Not on file  Stress:   . Feeling of Stress : Not on file  Social Connections:   . Frequency of Communication with Friends and Family: Not on file  . Frequency of Social Gatherings with Friends and Family: Not on file  . Attends Religious Services: Not on file  . Active Member of Clubs or Organizations: Not on file  . Attends Archivist Meetings: Not on file  . Marital Status: Not on file  Intimate Partner Violence:   . Fear of Current or Ex-Partner: Not on file  . Emotionally Abused: Not on file  . Physically Abused: Not on file  . Sexually Abused: Not on file    Family History  Problem Relation Age of Onset  . Heart disease Mother   . Heart attack Mother   . Prostate cancer Father   . Lung cancer Brother   . Pancreatic cancer Brother   . Headache Neg Hx     Past Medical History:  Diagnosis Date  . Allergic rhinitis   . Chronic diastolic CHF (congestive heart failure) ([place])   . CKD (chronic kidney disease), stage II   . Coronary artery disease   . Diabetes mellitus   . Diastolic  dysfunction   . Diverticulosis   . Drug-induced bradycardia 01/28/2016  . Hypercholesterolemia   . Hypertension   . Hyponatremia   . Meningioma ([place])   . Osteoarthritis   . Sedentary lifestyle   . Spinal stenosis   . Vertigo, benign positional     Patient Active Problem List   Diagnosis Date Noted  . Presbycusis of both ears 09/14/2016  . Drug-induced bradycardia 01/28/2016  . Cephalalgia 09/16/2015  . Orthostatic dizziness 09/16/2015  . Post-nasal drainage 09/16/2015  . SOB (shortness of breath) 07/29/2013  . Hypertension   . Diastolic dysfunction   . Hypercholesterolemia   . Diabetes mellitus   . Chronic diastolic CHF (congestive heart failure) ([place])     Past Surgical History:  Procedure Laterality Date  . BACK  SURGERY    . BREAST BIOPSY    . SKIN FULL THICKNESS GRAFT Right 11/08/2017   Procedure: RIGHT SMALL FINGER ABLATION OF NAIL ANDSKIN GRAFT FULL THICKNESS;  Surgeon: Leanora Cover, MD;  Location: [facility];  Service: Orthopedics;  Laterality: Right;  . TONSILLECTOMY      Current Outpatient Medications  Medication Sig Dispense Refill  . acetaminophen (TYLENOL) 500 MG tablet Take 500 mg by mouth every 6 (six) hours as needed (for hadaches or mild pain).     Marland Kitchen aspirin EC 81 MG tablet Take 81 mg by mouth at bedtime.     Marland Kitchen atorvastatin (LIPITOR) 20 MG tablet Take 20 mg by mouth daily.     . calcium carbonate (TUMS - DOSED IN MG ELEMENTAL CALCIUM) 500 MG chewable tablet Chew 1 tablet by mouth daily as needed for indigestion or heartburn.     . calcium citrate-vitamin D (CITRACAL+D) 315-200 MG-UNIT per tablet Take 1 tablet by mouth 2 (two) times daily.    . cetirizine (ZYRTEC) 10 MG tablet Take 10 mg by mouth at bedtime as needed for allergies or rhinitis.     . Cholecalciferol (VITAMIN D-3 PO) Take 1 capsule by mouth in the morning.    . clotrimazole (LOTRIMIN) 1 % cream Apply to both feet and between toes twice daily (Patient taking differently: Apply 1  application topically See admin instructions. Apply to both feet and between toes twice daily) 30 g 1  . diphenhydrAMINE (BENADRYL) 25 mg capsule Take 25 mg by mouth in the morning.    Marland Kitchen estradiol (ESTRACE) 0.1 MG/GM vaginal cream Place 1 Applicatorful vaginally 2 (two) times a week. Monday and Friday     . ferrous fumarate (HEMOCYTE - 106 MG FE) 325 (106 FE) MG TABS tablet Take 1 tablet by mouth in the morning.     . hydrochlorothiazide (MICROZIDE) 12.5 MG capsule TAKE (1) CAPSULE DAILY. (Patient taking differently: Take 12.5 mg by mouth daily. ) 90 capsule 3  . hydrocortisone (ANUSOL-HC) 25 MG suppository Place 25 mg rectally 2 (two) times daily as needed for hemorrhoids or itching.     . indomethacin (INDOCIN) 50 MG capsule Take 50 mg by mouth 2 (two) times daily as needed (pain). Take with food or milk     . Lancets (ONETOUCH ULTRASOFT) lancets 1 each by Other route as directed.     . metFORMIN (GLUCOPHAGE-XR) 500 MG 24 hr tablet Take 500 mg by mouth daily with breakfast.    . Misc Natural Products (OSTEO BI-FLEX ADV DOUBLE ST PO) Take 1 tablet by mouth daily.     . montelukast (SINGULAIR) 10 MG tablet Take 10 mg by mouth at bedtime.     Marland Kitchen omeprazole (PRILOSEC OTC) 20 MG tablet Take 20 mg by mouth daily before breakfast.     . ONE TOUCH ULTRA TEST test strip 1 each by Other route as directed.     Vladimir Faster Glyc-Propyl Glyc PF (SYSTANE HYDRATION PF) 0.4-0.3 % SOLN Place 1 drop into both eyes 3 (three) times daily as needed (for itching or irritation).    . polyethylene glycol powder (GLYCOLAX/MIRALAX) 17 GM/SCOOP powder Take 8.5 g by mouth daily as needed for mild constipation (MIX AND DRINK).    . potassium chloride (MICRO-K) 10 MEQ CR capsule Take 10 mEq by mouth in the morning.     . ramipril (ALTACE) 10 MG capsule Take 10 mg by mouth in the morning.     . Saline (SIMPLY SALINE) 0.9 %  AERS Place 1 spray into both nostrils as needed (for congestion).    Marland Kitchen senna (SENOKOT) 8.6 MG TABS tablet  Take 1 tablet by mouth at bedtime as needed for mild constipation.     . traZODone (DESYREL) 50 MG tablet Take 25 mg by mouth at bedtime.     . triamcinolone (NASACORT) 55 MCG/ACT AERO nasal inhaler Place 2 sprays into the nose daily.      No current facility-administered medications for this visit.    Allergies as of 02/17/2020 - Review Complete 02/17/2020  Allergen Reaction Noted  . Ciprofloxacin  01/29/2013  . Penicillins Hives and Other (See Comments) 02/23/2011  . Penicillin g Hives and Other (See Comments) 09/09/2015    Vitals: BP (!) 180/85   Pulse 67   Ht 5\' 5"  (1.651 m)   Wt 123 lb (55.8 kg)   BMI 20.47 kg/m  Last Weight:  Wt Readings from Last 1 Encounters:  02/17/20 123 lb (55.8 kg)   Last Height:   Ht Readings from Last 1 Encounters:  02/17/20 5\' 5"  (1.651 m)     Physical exam: Exam: Gen: NAD, conversant, thin                CV: RRR, no MRG. No Carotid Bruits. No peripheral edema, warm, nontender Eyes: Conjunctivae clear without exudates or hemorrhage  Neuro: Detailed Neurologic Exam  Speech:    Speech is normal; fluent and spontaneous with normal comprehension.  Cognition:    The patient is oriented to person, place, and time;     recent and remote memory impaired;     language fluent;     Impaired attention, concentration, und of knowledge Cranial Nerves:    The pupils are equal, round, and reactive to light. Pupils too small to visualize fundi. Visual fields are full to finger confrontation. Extraocular movements are intact. Trigeminal sensation is intact and the muscles of mastication are normal. The face is symmetric. The palate elevates in the midline. Hearing impaired. Voice is normal. Shoulder shrug is normal. The tongue has normal motion without fasciculations.   Coordination:    Normal finger to nose and heel to shin. Normal rapid alternating movements.   Gait:    Antalgic with a cane but stable with good stride  Motor Observation:    No  asymmetry, no atrophy, and no involuntary movements noted. Tone:    Normal muscle tone.    Posture:    Posture is normal. normal erect    Strength:    Strength is V/V in the upper and lower limbs.      Sensation: intact to LT     Reflex Exam:  DTR's:    Deep tendon reflexes in the upper and lower extremities are symmetrical bilaterally.   Toes:    The toes are downgoing bilaterally.   Clonus:    Clonus is absent.    Assessment/Plan:  [age] year old female who looks much younger than stated age with daily morning headaches that resolve shortly after getting up out of bed. Not migtainous.   She can nap a lot in the days, she is fatigued, she wakes up with daily headaches, snores, wakes often: sleep evaluation  MRI of the brain: MRI brain due to concerning symptoms of morning headaches, positional headaches,vision changes  to look for space occupying mass, chiari or intracranial hypertension (pseudotumor).  Bloodwork  Try sleeping on the side of elevate the head of bed    Orders Placed This Encounter  Procedures  .  MR BRAIN W WO CONTRAST  . Sedimentation rate  . C-reactive protein  . Basic Metabolic Panel  . Comprehensive metabolic panel  . CBC with Differential/Platelets  . TSH  . Ambulatory referral to Sleep Studies     Cc: [name], MD,  [name], MD  Sarina Ill, MD  [facility] [address] [place], [place] [address]  Phone [phone] Fax [phone]

## 2020-02-17 ENCOUNTER — Ambulatory Visit: Payer: Medicare PPO | Admitting: Neurology

## 2020-02-17 ENCOUNTER — Encounter: Payer: Self-pay | Admitting: Neurology

## 2020-02-17 ENCOUNTER — Telehealth: Payer: Self-pay | Admitting: Neurology

## 2020-02-17 VITALS — BP 180/85 | HR 67 | Ht 65.0 in | Wt 123.0 lb

## 2020-02-17 DIAGNOSIS — R519 Headache, unspecified: Secondary | ICD-10-CM

## 2020-02-17 DIAGNOSIS — R400 Somnolence: Secondary | ICD-10-CM | POA: Diagnosis not present

## 2020-02-17 DIAGNOSIS — R0683 Snoring: Secondary | ICD-10-CM | POA: Diagnosis not present

## 2020-02-17 DIAGNOSIS — R51 Headache with orthostatic component, not elsewhere classified: Secondary | ICD-10-CM

## 2020-02-17 NOTE — Telephone Encounter (Signed)
[employer] pending  

## 2020-02-17 NOTE — Patient Instructions (Signed)
MRI of the brain Blood work Sleep evaluation   Sleep Apnea Sleep apnea is a condition in which breathing pauses or becomes shallow during sleep. Episodes of sleep apnea usually last 10 seconds or longer, and they may occur as many as 20 times an hour. Sleep apnea disrupts your sleep and keeps your body from getting the rest that it needs. This condition can increase your risk of certain health problems, including:  Heart attack.  Stroke.  Obesity.  Diabetes.  Heart failure.  Irregular heartbeat. What are the causes? There are three kinds of sleep apnea:  Obstructive sleep apnea. This kind is caused by a blocked or collapsed airway.  Central sleep apnea. This kind happens when the part of the brain that controls breathing does not send the correct signals to the muscles that control breathing.  Mixed sleep apnea. This is a combination of obstructive and central sleep apnea. The most common cause of this condition is a collapsed or blocked airway. An airway can collapse or become blocked if:  Your throat muscles are abnormally relaxed.  Your tongue and tonsils are larger than normal.  You are overweight.  Your airway is smaller than normal. What increases the risk? You are more likely to develop this condition if you:  Are overweight.  Smoke.  Have a smaller than normal airway.  Are elderly.  Are female.  Drink alcohol.  Take sedatives or tranquilizers.  Have a family history of sleep apnea. What are the signs or symptoms? Symptoms of this condition include:  Trouble staying asleep.  Daytime sleepiness and tiredness.  Irritability.  Loud snoring.  Morning headaches.  Trouble concentrating.  Forgetfulness.  Decreased interest in sex.  Unexplained sleepiness.  Mood swings.  Personality changes.  Feelings of depression.  Waking up often during the night to urinate.  Dry mouth.  Sore throat. How is this diagnosed? This condition may be  diagnosed with:  A medical history.  A physical exam.  A series of tests that are done while you are sleeping (sleep study). These tests are usually done in a sleep lab, but they may also be done at home. How is this treated? Treatment for this condition aims to restore normal breathing and to ease symptoms during sleep. It may involve managing health issues that can affect breathing, such as high blood pressure or obesity. Treatment may include:  Sleeping on your side.  Using a decongestant if you have nasal congestion.  Avoiding the use of depressants, including alcohol, sedatives, and narcotics.  Losing weight if you are overweight.  Making changes to your diet.  Quitting smoking.  Using a device to open your airway while you sleep, such as: ? An oral appliance. This is a custom-made mouthpiece that shifts your lower jaw forward. ? A continuous positive airway pressure (CPAP) device. This device blows air through a mask when you breathe out (exhale). ? A nasal expiratory positive airway pressure (EPAP) device. This device has valves that you put into each nostril. ? A bi-level positive airway pressure (BPAP) device. This device blows air through a mask when you breathe in (inhale) and breathe out (exhale).  Having surgery if other treatments do not work. During surgery, excess tissue is removed to create a wider airway. It is important to get treatment for sleep apnea. Without treatment, this condition can lead to:  High blood pressure.  Coronary artery disease.  In men, an inability to achieve or maintain an erection (impotence).  Reduced thinking abilities. Follow  these instructions at home: Lifestyle  Make any lifestyle changes that your health care provider recommends.  Eat a healthy, well-balanced diet.  Take steps to lose weight if you are overweight.  Avoid using depressants, including alcohol, sedatives, and narcotics.  Do not use any products that contain  nicotine or tobacco, such as cigarettes, e-cigarettes, and chewing tobacco. If you need help quitting, ask your health care provider. General instructions  Take over-the-counter and prescription medicines only as told by your health care provider.  If you were given a device to open your airway while you sleep, use it only as told by your health care provider.  If you are having surgery, make sure to tell your health care provider you have sleep apnea. You may need to bring your device with you.  Keep all follow-up visits as told by your health care provider. This is important. Contact a health care provider if:  The device that you received to open your airway during sleep is uncomfortable or does not seem to be working.  Your symptoms do not improve.  Your symptoms get worse. Get help right away if:  You develop: ? Chest pain. ? Shortness of breath. ? Discomfort in your back, arms, or stomach.  You have: ? Trouble speaking. ? Weakness on one side of your body. ? Drooping in your face. These symptoms may represent a serious problem that is an emergency. Do not wait to see if the symptoms will go away. Get medical help right away. Call your local emergency services (911 in the [place] Do not drive yourself to the hospital. Summary  Sleep apnea is a condition in which breathing pauses or becomes shallow during sleep.  The most common cause is a collapsed or blocked airway.  The goal of treatment is to restore normal breathing and to ease symptoms during sleep. This information is not intended to replace advice given to you by your health care provider. Make sure you discuss any questions you have with your health care provider. Document Revised: 08/21/2018 Document Reviewed: 10/30/2017 Elsevier Patient Education  [place].

## 2020-02-18 LAB — CBC WITH DIFFERENTIAL/PLATELET
Basophils Absolute: 0.1 10*3/uL (ref 0.0–0.2)
Basos: 1 %
EOS (ABSOLUTE): 0.1 10*3/uL (ref 0.0–0.4)
Eos: 1 %
Hematocrit: 39.5 % (ref 34.0–46.6)
Hemoglobin: 13.6 g/dL (ref 11.1–15.9)
Immature Grans (Abs): 0 10*3/uL (ref 0.0–0.1)
Immature Granulocytes: 0 %
Lymphocytes Absolute: 2 10*3/uL (ref 0.7–3.1)
Lymphs: 28 %
MCH: 33 pg (ref 26.6–33.0)
MCHC: 34.4 g/dL (ref 31.5–35.7)
MCV: 96 fL (ref 79–97)
Monocytes Absolute: 0.5 10*3/uL (ref 0.1–0.9)
Monocytes: 7 %
Neutrophils Absolute: 4.6 10*3/uL (ref 1.4–7.0)
Neutrophils: 63 %
Platelets: 265 10*3/uL (ref 150–450)
RBC: 4.12 x10E6/uL (ref 3.77–5.28)
RDW: 12.3 % (ref 11.7–15.4)
WBC: 7.3 10*3/uL (ref 3.4–10.8)

## 2020-02-18 LAB — COMPREHENSIVE METABOLIC PANEL
ALT: 11 IU/L (ref 0–32)
AST: 16 IU/L (ref 0–40)
Albumin/Globulin Ratio: 1.5 (ref 1.2–2.2)
Albumin: 4.1 g/dL (ref 3.5–4.6)
Alkaline Phosphatase: 92 IU/L (ref 44–121)
BUN/Creatinine Ratio: 14 (ref 12–28)
BUN: 14 mg/dL (ref 10–36)
Bilirubin Total: 0.3 mg/dL (ref 0.0–1.2)
CO2: 28 mmol/L (ref 20–29)
Calcium: 9.5 mg/dL (ref 8.7–10.3)
Chloride: 96 mmol/L (ref 96–106)
Creatinine, Ser: 1.03 mg/dL — ABNORMAL HIGH (ref 0.57–1.00)
GFR calc Af Amer: 54 mL/min/{1.73_m2} — ABNORMAL LOW (ref >59)
GFR calc non Af Amer: 47 mL/min/{1.73_m2} — ABNORMAL LOW (ref >59)
Globulin, Total: 2.8 g/dL (ref 1.5–4.5)
Glucose: 94 mg/dL (ref 65–99)
Potassium: 4.7 mmol/L (ref 3.5–5.2)
Sodium: 133 mmol/L — ABNORMAL LOW (ref 134–144)
Total Protein: 6.9 g/dL (ref 6.0–8.5)

## 2020-02-18 LAB — C-REACTIVE PROTEIN: CRP: 1 mg/L (ref 0–10)

## 2020-02-18 LAB — TSH: TSH: 1.23 u[IU]/mL (ref 0.450–4.500)

## 2020-02-18 LAB — SEDIMENTATION RATE: Sed Rate: 4 mm/hr (ref 0–40)

## 2020-02-18 NOTE — Telephone Encounter (Signed)
Mcarthur Rossetti Josem Kaufmann: [id number] (exp. 02/17/20 to 03/18/20) order sent to GI. They will reach out to the patient to scheduled.

## 2020-02-23 DIAGNOSIS — M542 Cervicalgia: Secondary | ICD-10-CM | POA: Diagnosis not present

## 2020-02-23 DIAGNOSIS — M25519 Pain in unspecified shoulder: Secondary | ICD-10-CM | POA: Diagnosis not present

## 2020-03-01 DIAGNOSIS — M542 Cervicalgia: Secondary | ICD-10-CM | POA: Diagnosis not present

## 2020-03-01 DIAGNOSIS — M25519 Pain in unspecified shoulder: Secondary | ICD-10-CM | POA: Diagnosis not present

## 2020-03-07 ENCOUNTER — Other Ambulatory Visit: Payer: Self-pay

## 2020-03-07 ENCOUNTER — Ambulatory Visit
Admission: RE | Admit: 2020-03-07 | Discharge: 2020-03-07 | Disposition: A | Discharge status: Home / Self Care | Payer: Medicare PPO | Source: Ambulatory Visit | Attending: Neurology | Admitting: Neurology

## 2020-03-07 DIAGNOSIS — R519 Headache, unspecified: Secondary | ICD-10-CM | POA: Diagnosis not present

## 2020-03-07 DIAGNOSIS — R400 Somnolence: Secondary | ICD-10-CM

## 2020-03-07 DIAGNOSIS — R51 Headache with orthostatic component, not elsewhere classified: Secondary | ICD-10-CM | POA: Diagnosis not present

## 2020-03-07 MED ORDER — GADOBENATE DIMEGLUMINE 529 MG/ML IV SOLN
10.0000 mL | Freq: Once | INTRAVENOUS | Status: AC | PRN
  Administered 2020-03-07: 10 mL via INTRAVENOUS

## 2020-03-15 DIAGNOSIS — K648 Other hemorrhoids: Secondary | ICD-10-CM | POA: Diagnosis not present

## 2020-03-27 ENCOUNTER — Other Ambulatory Visit: Payer: Self-pay

## 2020-03-27 ENCOUNTER — Emergency Department (HOSPITAL_COMMUNITY)
Admission: EM | Admit: 2020-03-27 | Discharge: 2020-03-27 | Disposition: A | Discharge status: Home / Self Care | Payer: Medicare PPO | Attending: Emergency Medicine | Admitting: Emergency Medicine

## 2020-03-27 ENCOUNTER — Encounter (HOSPITAL_COMMUNITY): Payer: Self-pay | Admitting: Emergency Medicine

## 2020-03-27 ENCOUNTER — Emergency Department (HOSPITAL_COMMUNITY): Payer: Medicare PPO

## 2020-03-27 DIAGNOSIS — R262 Difficulty in walking, not elsewhere classified: Secondary | ICD-10-CM | POA: Diagnosis not present

## 2020-03-27 DIAGNOSIS — N182 Chronic kidney disease, stage 2 (mild): Secondary | ICD-10-CM | POA: Diagnosis not present

## 2020-03-27 DIAGNOSIS — Z7982 Long term (current) use of aspirin: Secondary | ICD-10-CM | POA: Diagnosis not present

## 2020-03-27 DIAGNOSIS — R4182 Altered mental status, unspecified: Secondary | ICD-10-CM | POA: Diagnosis not present

## 2020-03-27 DIAGNOSIS — I2510 Atherosclerotic heart disease of native coronary artery without angina pectoris: Secondary | ICD-10-CM | POA: Insufficient documentation

## 2020-03-27 DIAGNOSIS — Z7984 Long term (current) use of oral hypoglycemic drugs: Secondary | ICD-10-CM | POA: Insufficient documentation

## 2020-03-27 DIAGNOSIS — R531 Weakness: Secondary | ICD-10-CM | POA: Insufficient documentation

## 2020-03-27 DIAGNOSIS — Z79899 Other long term (current) drug therapy: Secondary | ICD-10-CM | POA: Insufficient documentation

## 2020-03-27 DIAGNOSIS — R5383 Other fatigue: Secondary | ICD-10-CM | POA: Diagnosis not present

## 2020-03-27 DIAGNOSIS — J9811 Atelectasis: Secondary | ICD-10-CM | POA: Diagnosis not present

## 2020-03-27 DIAGNOSIS — I6529 Occlusion and stenosis of unspecified carotid artery: Secondary | ICD-10-CM | POA: Diagnosis not present

## 2020-03-27 DIAGNOSIS — R001 Bradycardia, unspecified: Secondary | ICD-10-CM | POA: Diagnosis not present

## 2020-03-27 DIAGNOSIS — E1122 Type 2 diabetes mellitus with diabetic chronic kidney disease: Secondary | ICD-10-CM | POA: Diagnosis not present

## 2020-03-27 DIAGNOSIS — J3489 Other specified disorders of nose and nasal sinuses: Secondary | ICD-10-CM | POA: Diagnosis not present

## 2020-03-27 DIAGNOSIS — I130 Hypertensive heart and chronic kidney disease with heart failure and stage 1 through stage 4 chronic kidney disease, or unspecified chronic kidney disease: Secondary | ICD-10-CM | POA: Insufficient documentation

## 2020-03-27 DIAGNOSIS — Z87891 Personal history of nicotine dependence: Secondary | ICD-10-CM | POA: Insufficient documentation

## 2020-03-27 DIAGNOSIS — I5032 Chronic diastolic (congestive) heart failure: Secondary | ICD-10-CM | POA: Diagnosis not present

## 2020-03-27 DIAGNOSIS — J90 Pleural effusion, not elsewhere classified: Secondary | ICD-10-CM | POA: Diagnosis not present

## 2020-03-27 DIAGNOSIS — J323 Chronic sphenoidal sinusitis: Secondary | ICD-10-CM | POA: Diagnosis not present

## 2020-03-27 LAB — URINALYSIS, ROUTINE W REFLEX MICROSCOPIC
Bacteria, UA: NONE SEEN
Bilirubin Urine: NEGATIVE
Glucose, UA: NEGATIVE mg/dL
Hgb urine dipstick: NEGATIVE
Ketones, ur: NEGATIVE mg/dL
Nitrite: NEGATIVE
Protein, ur: NEGATIVE mg/dL
Specific Gravity, Urine: 1.01 (ref 1.005–1.030)
pH: 7 (ref 5.0–8.0)

## 2020-03-27 LAB — BASIC METABOLIC PANEL
Anion gap: 10 (ref 5–15)
BUN: 13 mg/dL (ref 8–23)
CO2: 26 mmol/L (ref 22–32)
Calcium: 8.8 mg/dL — ABNORMAL LOW (ref 8.9–10.3)
Chloride: 98 mmol/L (ref 98–111)
Creatinine, Ser: 1.01 mg/dL — ABNORMAL HIGH (ref 0.44–1.00)
GFR, Estimated: 52 mL/min — ABNORMAL LOW (ref >60)
Glucose, Bld: 104 mg/dL — ABNORMAL HIGH (ref 70–99)
Potassium: 3.8 mmol/L (ref 3.5–5.1)
Sodium: 134 mmol/L — ABNORMAL LOW (ref 135–145)

## 2020-03-27 LAB — CBC
HCT: 39.7 % (ref 36.0–46.0)
Hemoglobin: 12.8 g/dL (ref 12.0–15.0)
MCH: 31.3 pg (ref 26.0–34.0)
MCHC: 32.2 g/dL (ref 30.0–36.0)
MCV: 97.1 fL (ref 80.0–100.0)
Platelets: 263 10*3/uL (ref 150–400)
RBC: 4.09 MIL/uL (ref 3.87–5.11)
RDW: 13 % (ref 11.5–15.5)
WBC: 5.9 10*3/uL (ref 4.0–10.5)
nRBC: 0 % (ref 0.0–0.2)

## 2020-03-27 LAB — CBG MONITORING, ED: Glucose-Capillary: 119 mg/dL — ABNORMAL HIGH (ref 70–99)

## 2020-03-27 NOTE — ED Notes (Signed)
Patient transported to CT 

## 2020-03-27 NOTE — ED Provider Notes (Signed)
[facility] EMERGENCY DEPARTMENT Provider Note   CSN: [id number] Arrival date & time: 03/27/20  [id number]     History Chief Complaint  Patient presents with  . Weakness    Brenda Lynch is a 87 y.o. female.  The history is provided by the patient and medical records. No language interpreter was used.  Weakness Severity:  Mild Onset quality:  Gradual Duration:  1 day Timing:  Constant Progression:  Improving Chronicity:  Recurrent Relieved by:  Nothing Worsened by:  Nothing Ineffective treatments:  None tried Associated symptoms: difficulty walking   Associated symptoms: no abdominal pain, no aphasia, no ataxia, no chest pain, no cough, no diarrhea, no dizziness, no dysuria, no numbness in extremities, no falls, no fever, no foul-smelling urine, no frequency, no headaches, no lethargy, no loss of consciousness, no myalgias, no nausea, no near-syncope, no seizures, no shortness of breath, no stroke symptoms, no syncope, no vision change and no vomiting        Past Medical History:  Diagnosis Date  . Allergic rhinitis   . Chronic diastolic CHF (congestive heart failure) ([place])   . CKD (chronic kidney disease), stage II   . Coronary artery disease   . Diabetes mellitus   . Diastolic dysfunction   . Diverticulosis   . Drug-induced bradycardia 01/28/2016  . Hypercholesterolemia   . Hypertension   . Hyponatremia   . Meningioma ([place])   . Osteoarthritis   . Sedentary lifestyle   . Spinal stenosis   . Vertigo, benign positional     Patient Active Problem List   Diagnosis Date Noted  . Presbycusis of both ears 09/14/2016  . Drug-induced bradycardia 01/28/2016  . Cephalalgia 09/16/2015  . Orthostatic dizziness 09/16/2015  . Post-nasal drainage 09/16/2015  . SOB (shortness of breath) 07/29/2013  . Hypertension   . Diastolic dysfunction   . Hypercholesterolemia   . Diabetes mellitus   . Chronic diastolic CHF (congestive heart failure) ([place])     Past  Surgical History:  Procedure Laterality Date  . BACK SURGERY    . BREAST BIOPSY    . SKIN FULL THICKNESS GRAFT Right 11/08/2017   Procedure: RIGHT SMALL FINGER ABLATION OF NAIL ANDSKIN GRAFT FULL THICKNESS;  Surgeon: Leanora Cover, MD;  Location: [facility];  Service: Orthopedics;  Laterality: Right;  . TONSILLECTOMY       OB History   No obstetric history on file.     Family History  Problem Relation Age of Onset  . Heart disease Mother   . Heart attack Mother   . Prostate cancer Father   . Lung cancer Brother   . Pancreatic cancer Brother   . Headache Neg Hx     Social History   Tobacco Use  . Smoking status: Former Smoker    Quit date: 01/30/1979    Years since quitting: 41.1  . Smokeless tobacco: Never Used  Vaping Use  . Vaping Use: Never used  Substance Use Topics  . Alcohol use: No  . Drug use: No    Home Medications Prior to Admission medications   Medication Sig Start Date End Date Taking? Authorizing Provider  acetaminophen (TYLENOL) 500 MG tablet Take 500 mg by mouth every 6 (six) hours as needed (for hadaches or mild pain).     [provider]  aspirin EC 81 MG tablet Take 81 mg by mouth at bedtime.     [provider]  atorvastatin (LIPITOR) 20 MG tablet Take 20 mg by  mouth daily.  07/17/14   [provider]  calcium carbonate (TUMS - DOSED IN MG ELEMENTAL CALCIUM) 500 MG chewable tablet Chew 1 tablet by mouth daily as needed for indigestion or heartburn.     [provider]  calcium citrate-vitamin D (CITRACAL+D) 315-200 MG-UNIT per tablet Take 1 tablet by mouth 2 (two) times daily.    [provider]  cetirizine (ZYRTEC) 10 MG tablet Take 10 mg by mouth at bedtime as needed for allergies or rhinitis.     [provider]  Cholecalciferol (VITAMIN D-3 PO) Take 1 capsule by mouth in the morning.    [provider]  clotrimazole (LOTRIMIN) 1 % cream Apply to both feet and between toes  twice daily Patient taking differently: Apply 1 application topically See admin instructions. Apply to both feet and between toes twice daily 02/05/18   Marzetta Board, DPM  diphenhydrAMINE (BENADRYL) 25 mg capsule Take 25 mg by mouth in the morning.    [provider]  estradiol (ESTRACE) 0.1 MG/GM vaginal cream Place 1 Applicatorful vaginally 2 (two) times a week. Monday and Friday     [provider]  ferrous fumarate (HEMOCYTE - 106 MG FE) 325 (106 FE) MG TABS tablet Take 1 tablet by mouth in the morning.     [provider]  hydrochlorothiazide (MICROZIDE) 12.5 MG capsule TAKE (1) CAPSULE DAILY. Patient taking differently: Take 12.5 mg by mouth daily.  06/06/19   Sueanne Margarita, MD  hydrocortisone (ANUSOL-HC) 25 MG suppository Place 25 mg rectally 2 (two) times daily as needed for hemorrhoids or itching.     [provider]  indomethacin (INDOCIN) 50 MG capsule Take 50 mg by mouth 2 (two) times daily as needed (pain). Take with food or milk     [provider]  Lancets (ONETOUCH ULTRASOFT) lancets 1 each by Other route as directed.  05/01/14   [provider]  metFORMIN (GLUCOPHAGE-XR) 500 MG 24 hr tablet Take 500 mg by mouth daily with breakfast.    [provider]  Misc Natural Products (OSTEO BI-FLEX ADV DOUBLE ST PO) Take 1 tablet by mouth daily.     [provider]  montelukast (SINGULAIR) 10 MG tablet Take 10 mg by mouth at bedtime.  12/12/12   [provider]  omeprazole (PRILOSEC OTC) 20 MG tablet Take 20 mg by mouth daily before breakfast.     [provider]  ONE TOUCH ULTRA TEST test strip 1 each by Other route as directed.  06/21/14   [provider]  Polyethyl Glyc-Propyl Glyc PF (SYSTANE HYDRATION PF) 0.4-0.3 % SOLN Place 1 drop into both eyes 3 (three) times daily as needed (for itching or irritation).    [provider]  polyethylene glycol powder (GLYCOLAX/MIRALAX) 17  GM/SCOOP powder Take 8.5 g by mouth daily as needed for mild constipation (MIX AND DRINK).    [provider]  potassium chloride (MICRO-K) 10 MEQ CR capsule Take 10 mEq by mouth in the morning.  07/17/14   [provider]  ramipril (ALTACE) 10 MG capsule Take 10 mg by mouth in the morning.  10/06/19   [provider]  Saline (SIMPLY SALINE) 0.9 % AERS Place 1 spray into both nostrils as needed (for congestion).    [provider]  senna (SENOKOT) 8.6 MG TABS tablet Take 1 tablet by mouth at bedtime as needed for mild constipation.     [provider]  traZODone (DESYREL) 50 MG tablet  Take 25 mg by mouth at bedtime.  09/08/19   [provider]  triamcinolone (NASACORT) 55 MCG/ACT AERO nasal inhaler Place 2 sprays into the nose daily.     [provider]    Allergies    Ciprofloxacin, Penicillins, and Penicillin g  Review of Systems   Review of Systems  Constitutional: Positive for fatigue. Negative for chills, diaphoresis and fever.  HENT: Negative for congestion and rhinorrhea.   Eyes: Negative for visual disturbance.  Respiratory: Negative for cough, chest tightness, shortness of breath and wheezing.   Cardiovascular: Negative for chest pain, palpitations, leg swelling, syncope and near-syncope.  Gastrointestinal: Negative for abdominal pain, constipation, diarrhea, nausea and vomiting.  Genitourinary: Negative for dysuria, flank pain and frequency.  Musculoskeletal: Positive for gait problem. Negative for back pain, falls, myalgias, neck pain and neck stiffness.  Skin: Negative for rash and wound.  Neurological: Positive for weakness. Negative for dizziness, tremors, seizures, loss of consciousness, speech difficulty, light-headedness, numbness and headaches.  Psychiatric/Behavioral: Negative for agitation.  All other systems reviewed and are negative.   Physical Exam Updated Vital Signs BP (!) 175/75   Pulse 65   Temp  98.5 F (36.9 C) (Oral)   Resp 18   SpO2 97%   Physical Exam Vitals and nursing note reviewed.  Constitutional:      General: She is not in acute distress.    Appearance: She is well-developed and well-nourished. She is not ill-appearing, toxic-appearing or diaphoretic.  HENT:     Head: Normocephalic and atraumatic.     Nose: Nose normal. No congestion or rhinorrhea.     Mouth/Throat:     Mouth: Mucous membranes are moist.     Pharynx: No oropharyngeal exudate or posterior oropharyngeal erythema.  Eyes:     Extraocular Movements: Extraocular movements intact.     Conjunctiva/sclera: Conjunctivae normal.     Pupils: Pupils are equal, round, and reactive to light.  Cardiovascular:     Rate and Rhythm: Normal rate and regular rhythm.     Pulses: Normal pulses.     Heart sounds: No murmur heard.   Pulmonary:     Effort: Pulmonary effort is normal. No respiratory distress.     Breath sounds: Normal breath sounds. No wheezing, rhonchi or rales.  Chest:     Chest wall: No tenderness.  Abdominal:     General: Abdomen is flat.     Palpations: Abdomen is soft.     Tenderness: There is no abdominal tenderness. There is no right CVA tenderness, left CVA tenderness, guarding or rebound.  Musculoskeletal:        General: No tenderness, signs of injury or edema.     Cervical back: Neck supple. No tenderness.     Right lower leg: No edema.     Left lower leg: No edema.  Skin:    General: Skin is warm and dry.     Capillary Refill: Capillary refill takes less than 2 seconds.     Findings: No erythema.  Neurological:     General: No focal deficit present.     Mental Status: She is alert. Mental status is at baseline.     Cranial Nerves: No cranial nerve deficit or facial asymmetry.     Sensory: Sensation is intact. No sensory deficit.     Motor: No weakness, abnormal muscle tone or seizure activity.     Coordination: Coordination normal. Finger-Nose-Finger Test normal.     Gait: Gait  normal.  Comments: Patient was assisted and stood up.  She was able to stand and take several steps without difficulty.  Son reports this is her baseline mobility and she is back to her normal self from a mental status standpoint.  She reports still feeling fatigued but otherwise has no acute abnormalities on exam  Psychiatric:        Mood and Affect: Mood and affect and mood normal.     ED Results / Procedures / Treatments   Labs (all labs ordered are listed, but only abnormal results are displayed) Labs Reviewed  BASIC METABOLIC PANEL - Abnormal; Notable for the following components:      Result Value   Sodium 134 (*)    Glucose, Bld 104 (*)    Creatinine, Ser 1.01 (*)    Calcium 8.8 (*)    GFR, Estimated 52 (*)    All other components within normal limits  URINALYSIS, ROUTINE W REFLEX MICROSCOPIC - Abnormal; Notable for the following components:   Leukocytes,Ua TRACE (*)    All other components within normal limits  CBG MONITORING, ED - Abnormal; Notable for the following components:   Glucose-Capillary 119 (*)    All other components within normal limits  URINE CULTURE  CBC  TSH    EKG EKG Interpretation  Date/Time:  Saturday March 27 2020 09:17:31 EST Ventricular Rate:  51 PR Interval:  214 QRS Duration: 80 QT Interval:  482 QTC Calculation: 444 R Axis:   59 Text Interpretation: Sinus bradycardia with 1st degree A-V block Possible Left atrial enlargement Cannot rule out Anterior infarct , age undetermined ST & T wave abnormality, consider inferolateral ischemia Abnormal ECG When cmpared to prior, slower rate. No STEMI Confirmed by Antony Blackbird [phone]) on 03/27/2020 5:20:54 PM   Radiology CT Head Wo Contrast  Result Date: 03/27/2020 CLINICAL DATA:  Weakness EXAM: CT HEAD WITHOUT CONTRAST TECHNIQUE: Contiguous axial images were obtained from the base of the skull through the vertex without intravenous contrast. COMPARISON:  November 11, 2019, March 07, 2020  FINDINGS: Evaluation is limited secondary to motion at the skull base. Brain: No evidence of acute infarction, hemorrhage, hydrocephalus, extra-axial collection or new mass lesion/mass effect within the limitations of the exam. Unchanged 14 mm calcified mass consistent with a meningioma along the falx. Global parenchymal volume loss, age-appropriate. Periventricular white matter hypodensities consistent with sequela of chronic microvascular ischemic disease. Vascular: Vascular calcifications of the carotid siphons. Skull: Normal. Negative for fracture or focal lesion. Sinuses/Orbits: Chronic partial opacification of the LEFT sphenoid sinus. Hyperostosis frontalis. Other: Degenerative changes of the RIGHT TMJ. IMPRESSION: 1. Evaluation of the skull base is limited secondary to motion. Within this limitation, no acute intracranial abnormality. Electronically Signed   By: Valentino Saxon MD   On: 03/27/2020 19:34   DG Chest Portable 1 View  Result Date: 03/27/2020 CLINICAL DATA:  Altered mental status. EXAM: PORTABLE CHEST 1 VIEW COMPARISON:  12/10/2019 FINDINGS: The heart size is stable. Aortic calcifications are noted. There are new small bilateral pleural effusions, left greater than right. There is atelectasis at the lung bases. No pneumothorax. No definite acute osseous abnormality. IMPRESSION: 1. New small bilateral pleural effusions, left greater than right. 2. Bibasilar atelectasis. Electronically Signed   By: Constance Holster M.D.   On: 03/27/2020 19:02    Procedures Procedures (including critical care time)  Medications Ordered in ED Medications - No data to display  ED Course  I have reviewed the triage vital signs and the nursing notes.  Pertinent labs & imaging results that were available during my care of the patient were reviewed by me and considered in my medical decision making (see chart for details).    MDM Rules/Calculators/A&P                          Brenda Lynch is a  87 y.o. female with a past medical history significant for hypertension, hypercholesterolemia, vertigo, CAD, chronic meningioma, CHF, CKD, diabetes, osteoarthritis, and cognitive disorder who presents for fatigue and difficulty with ambulation today.  According to the patient's son who accompanied her, this morning, she woke up and felt too tired and weak to get up and make her coffee which is very abnormal for her.  She also had difficulty when she tried to stand up feeling that she was too weak to stand.  This is new for her.  He reports that over the last month she has had 2 other episodes of this that went away on their own.  She denies any trauma.  She denies any pain whatsoever.  No headache, neck pain, neck stiffness, chest pain, abdominal pain, back pain, or extremity pains.  She denies new leg swelling.  Denies any nausea, vomiting, constipation, diarrhea, or urinary changes.  She just feels fatigued.  She has had some headaches the son reports has not had headache today.  On exam, lungs are clear and chest is nontender.  Abdomen is nontender.  Her oxygen saturations are 100% on room air.  She is not tachycardic or tachypneic and is not hypotensive.  She is afebrile.  Normal bowel sounds.  No focal neurologic deficits with normal strength and sensation in extremities.  Normal finger-nose-finger testing.  I was able to stand of the patient with the son's assistance and she was able to stand and walk both forward and backwards.  He reports this is at her baseline now.  He thinks she is looking back to her normal self.  Patient reports still feeling tired otherwise.  Patient had work-up to look for occult infection or significant abnormality causing her fatigue and difficulty ambulating earlier today.  We did get a head CT given the headache and her difficulty with walking.  CT head showed no acute new findings.  Chest x-ray shows no pneumonia but does show some pleural effusions.  Given her lack of  respiratory symptoms and no hypoxia with ambulation, we agree she is likely safe to follow-up with PCP for this.  Her other labs showed no evidence of UTI but a culture was sent.  Other labs similar to prior.  As patient is back to her baseline and patient appears well per the son, they do feel comfortable taking her home and have her follow-up with her PCP this week.  He would like her to be discharged so she is not around multiple COVID patients in the emergency department.  This is a reasonable plan given the patient's return to baseline and her otherwise well appearance.  She will follow-up with her PCP for the pleural effusions and understand extremely strict return precautions.  There are no other questions or concerns and patient was discharged in good condition back to her baseline.             Final Clinical Impression(s) / ED Diagnoses Final diagnoses:  Generalized weakness  Fatigue, unspecified type  Trouble walking    Rx / DC Orders ED Discharge Orders    None  Clinical Impression: 1. Generalized weakness   2. Fatigue, unspecified type   3. Trouble walking     Disposition: Discharge  Condition: Good  I have discussed the results, Dx and Tx plan with the pt(& family if present). He/she/they expressed understanding and agree(s) with the plan. Discharge instructions discussed at great length. Strict return precautions discussed and pt &/or family have verbalized understanding of the instructions. No further questions at time of discharge.    New Prescriptions   No medications on file    Follow Up: [name], MD [address] [place] [address] [address] [id number] mc [place]       Maribella Kuna, Gwenyth Allegra, MD 03/27/20 2009

## 2020-03-27 NOTE — Discharge Instructions (Signed)
Her work-up today was overall reassuring.  The CT head did not show new acute abnormality, her x-ray of the lungs did not show any pneumonia, and her urine does not show infection.  As we discussed, there were some small pleural effusions but her oxygen saturations were normal and she was not having any breathing troubles.  Her other labs were similar to prior and reassuring.  Please encourage hydration.  We were able to ambulate her and she was at her baseline in regards to movement.  Please have her follow-up with her doctor this week for reevaluation as we have ruled out acute abnormalities tonight.  If any symptoms change or worsen acutely, please return to the nearest emergency department.

## 2020-03-27 NOTE — ED Triage Notes (Signed)
Pt to triage via [facility] from home.  Family member with pt.  Reports generalized weakness and nausea.  Pt normally able to ambulate with walker but was unable to get out of bed this morning.

## 2020-03-27 NOTE — ED Notes (Addendum)
Patient verbalizes understanding of discharge instructions. Opportunity for questioning and answers were provided. Armband removed by staff, pt discharged from ED ambulatory.   

## 2020-03-27 NOTE — ED Notes (Signed)
Pt ambulated to restroom with walker, steady gait

## 2020-03-28 LAB — URINE CULTURE: Culture: NO GROWTH

## 2020-03-29 ENCOUNTER — Ambulatory Visit: Payer: Medicare PPO | Admitting: Podiatry

## 2020-04-06 ENCOUNTER — Institutional Professional Consult (permissible substitution): Payer: Medicare PPO | Admitting: Neurology

## 2020-04-23 DIAGNOSIS — E1122 Type 2 diabetes mellitus with diabetic chronic kidney disease: Secondary | ICD-10-CM | POA: Diagnosis not present

## 2020-04-23 DIAGNOSIS — R519 Headache, unspecified: Secondary | ICD-10-CM | POA: Diagnosis not present

## 2020-04-23 DIAGNOSIS — I5030 Unspecified diastolic (congestive) heart failure: Secondary | ICD-10-CM | POA: Diagnosis not present

## 2020-04-23 DIAGNOSIS — I700 Atherosclerosis of aorta: Secondary | ICD-10-CM | POA: Diagnosis not present

## 2020-04-23 DIAGNOSIS — N183 Chronic kidney disease, stage 3 unspecified: Secondary | ICD-10-CM | POA: Diagnosis not present

## 2020-04-23 DIAGNOSIS — E7800 Pure hypercholesterolemia, unspecified: Secondary | ICD-10-CM | POA: Diagnosis not present

## 2020-04-23 DIAGNOSIS — I10 Essential (primary) hypertension: Secondary | ICD-10-CM | POA: Diagnosis not present

## 2020-04-23 DIAGNOSIS — E46 Unspecified protein-calorie malnutrition: Secondary | ICD-10-CM | POA: Diagnosis not present

## 2020-04-23 DIAGNOSIS — F0390 Unspecified dementia without behavioral disturbance: Secondary | ICD-10-CM | POA: Diagnosis not present

## 2020-05-11 ENCOUNTER — Other Ambulatory Visit: Payer: Self-pay

## 2020-05-11 ENCOUNTER — Encounter: Payer: Self-pay | Admitting: Cardiology

## 2020-05-11 ENCOUNTER — Ambulatory Visit: Payer: Medicare PPO | Admitting: Cardiology

## 2020-05-11 VITALS — BP 132/72 | HR 67 | Ht 65.0 in | Wt 126.0 lb

## 2020-05-11 DIAGNOSIS — E119 Type 2 diabetes mellitus without complications: Secondary | ICD-10-CM | POA: Diagnosis not present

## 2020-05-11 DIAGNOSIS — E7800 Pure hypercholesterolemia, unspecified: Secondary | ICD-10-CM

## 2020-05-11 DIAGNOSIS — I5032 Chronic diastolic (congestive) heart failure: Secondary | ICD-10-CM | POA: Diagnosis not present

## 2020-05-11 DIAGNOSIS — I10 Essential (primary) hypertension: Secondary | ICD-10-CM | POA: Diagnosis not present

## 2020-05-11 NOTE — Patient Instructions (Signed)

## 2020-05-11 NOTE — Progress Notes (Signed)
Cardiology Office Note:    Date:  05/11/2020   ID:  Brenda Lynch, DOB 1923-05-07, MRN [id number]  PCP:  [name], MD  Cardiologist:  Fransico Him, MD    Referring MD: [name], MD   Chief Complaint  Patient presents with  . Congestive Heart Failure  . Hypertension  . Hyperlipidemia    History of Present Illness:    Brenda Lynch is a 87 y.o. female with a hx of HTN, diastolic dysfunction with chronic diastolic CHF, chronic SOB secondary to chronic diastolic CHF/sedentary lifestyle and anxiety, chronically abnormal EKG with LVH and repolarization.  She is here today for followup and is doing well.  She denies any chest pain or pressure, SOB, DOE, PND, orthopnea, LE edema, dizziness, palpitations or syncope. She is compliant with her meds and is tolerating meds with no SE.    Past Medical History:  Diagnosis Date  . Allergic rhinitis   . Chronic diastolic CHF (congestive heart failure) ([place])   . CKD (chronic kidney disease), stage II   . Coronary artery disease   . Diabetes mellitus   . Diastolic dysfunction   . Diverticulosis   . Drug-induced bradycardia 01/28/2016  . Hypercholesterolemia   . Hypertension   . Hyponatremia   . Meningioma ([place])   . Osteoarthritis   . Sedentary lifestyle   . Spinal stenosis   . Vertigo, benign positional     Past Surgical History:  Procedure Laterality Date  . BACK SURGERY    . BREAST BIOPSY    . SKIN FULL THICKNESS GRAFT Right 11/08/2017   Procedure: RIGHT SMALL FINGER ABLATION OF NAIL ANDSKIN GRAFT FULL THICKNESS;  Surgeon: Leanora Cover, MD;  Location: [facility];  Service: Orthopedics;  Laterality: Right;  . TONSILLECTOMY      Current Medications: Current Meds  Medication Sig  . acetaminophen (TYLENOL) 500 MG tablet Take 500 mg by mouth every 6 (six) hours as needed (for hadaches or mild pain).   Marland Kitchen aspirin EC 81 MG tablet Take 81 mg by mouth at bedtime.   Marland Kitchen atorvastatin (LIPITOR) 20 MG tablet Take 20  mg by mouth daily.   . calcium carbonate (TUMS - DOSED IN MG ELEMENTAL CALCIUM) 500 MG chewable tablet Chew 1 tablet by mouth daily as needed for indigestion or heartburn.  . calcium citrate-vitamin D (CITRACAL+D) 315-200 MG-UNIT per tablet Take 1 tablet by mouth 2 (two) times daily.  . cetirizine (ZYRTEC) 10 MG tablet Take 10 mg by mouth at bedtime as needed for allergies or rhinitis.   . Cholecalciferol (VITAMIN D-3 PO) Take 1 capsule by mouth in the morning.  . clotrimazole (LOTRIMIN) 1 % cream Apply to both feet and between toes twice daily (Patient taking differently: Apply 1 application topically See admin instructions. Apply to both feet and between toes twice daily)  . diphenhydrAMINE (BENADRYL) 25 mg capsule Take 25 mg by mouth in the morning.  Marland Kitchen estradiol (ESTRACE) 0.1 MG/GM vaginal cream Place 1 Applicatorful vaginally 2 (two) times a week. Monday and Friday   . ferrous fumarate (HEMOCYTE - 106 MG FE) 325 (106 FE) MG TABS tablet Take 1 tablet by mouth in the morning.  . hydrochlorothiazide (MICROZIDE) 12.5 MG capsule TAKE (1) CAPSULE DAILY. (Patient taking differently: Take 12.5 mg by mouth daily.)  . hydrocortisone (ANUSOL-HC) 25 MG suppository Place 25 mg rectally 2 (two) times daily as needed for hemorrhoids or itching.   . indomethacin (INDOCIN) 50 MG capsule Take 50 mg by mouth  2 (two) times daily as needed (pain). Take with food or milk   . Lancets (ONETOUCH ULTRASOFT) lancets 1 each by Other route as directed.   . Misc Natural Products (OSTEO BI-FLEX ADV DOUBLE ST PO) Take 1 tablet by mouth daily.  . montelukast (SINGULAIR) 10 MG tablet Take 10 mg by mouth at bedtime.   Marland Kitchen omeprazole (PRILOSEC OTC) 20 MG tablet Take 20 mg by mouth daily before breakfast.   . ONE TOUCH ULTRA TEST test strip 1 each by Other route as directed.   Vladimir Faster Glyc-Propyl Glyc PF (SYSTANE HYDRATION PF) 0.4-0.3 % SOLN Place 1 drop into both eyes 3 (three) times daily as needed (for itching or irritation).   . polyethylene glycol powder (GLYCOLAX/MIRALAX) 17 GM/SCOOP powder Take 8.5 g by mouth daily as needed for mild constipation (MIX AND DRINK).  . potassium chloride (MICRO-K) 10 MEQ CR capsule Take 10 mEq by mouth in the morning.   . ramipril (ALTACE) 10 MG capsule Take 10 mg by mouth in the morning.   . Saline (SIMPLY SALINE) 0.9 % AERS Place 1 spray into both nostrils as needed (for congestion).  Marland Kitchen senna (SENOKOT) 8.6 MG TABS tablet Take 1 tablet by mouth at bedtime as needed for mild constipation.   . traZODone (DESYREL) 50 MG tablet Take 25 mg by mouth at bedtime.  . triamcinolone (NASACORT) 55 MCG/ACT AERO nasal inhaler Place 2 sprays into the nose daily.      Allergies:   Ciprofloxacin, Penicillins, and Penicillin g   Social History   Socioeconomic History  . Marital status: Widowed    Spouse name: Not on file  . Number of children: Not on file  . Years of education: Not on file  . Highest education level: Not on file  Occupational History  . Occupation: retired  Tobacco Use  . Smoking status: Former Smoker    Quit date: 01/30/1979    Years since quitting: 41.3  . Smokeless tobacco: Never Used  Vaping Use  . Vaping Use: Never used  Substance and Sexual Activity  . Alcohol use: No  . Drug use: No  . Sexual activity: Not on file  Other Topics Concern  . Not on file  Social History Narrative   Lives alone with sons close by   Social Determinants of Health   Financial Resource Strain: Not on file  Food Insecurity: Not on file  Transportation Needs: Not on file  Physical Activity: Not on file  Stress: Not on file  Social Connections: Not on file     Family History: The patient's family history includes Heart attack in her mother; Heart disease in her mother; Lung cancer in her brother; Pancreatic cancer in her brother; Prostate cancer in her father. There is no history of Headache.  ROS:   Please see the history of present illness.    ROS  All other systems  reviewed and negative.   EKGs/Labs/Other Studies Reviewed:    The following studies were reviewed today: EKG, outside labs from PCP on [facility]  EKG:  EKG is not ordered today Recent Labs: 02/17/2020: ALT 11; TSH 1.230 03/27/2020: BUN 13; Creatinine, Ser 1.01; Hemoglobin 12.8; Platelets 263; Potassium 3.8; Sodium 134   Recent Lipid Panel    Component Value Date/Time   CHOL 207 (H) 05/16/2019 1351   TRIG 65 05/16/2019 1351   HDL 60 05/16/2019 1351   CHOLHDL 3.5 05/16/2019 1351   LDLCALC 135 (H) 05/16/2019 1351    Physical Exam:  VS:  BP 132/72   Pulse 67   Ht 5\' 5"  (1.651 m)   Wt 126 lb (57.2 kg)   SpO2 95%   BMI 20.97 kg/m     Wt Readings from Last 3 Encounters:  05/11/20 126 lb (57.2 kg)  03/27/20 123 lb 0.3 oz (55.8 kg)  02/17/20 123 lb (55.8 kg)    GEN: Well nourished, well developed in no acute distress HEENT: Normal NECK: No JVD; No carotid bruits LYMPHATICS: No lymphadenopathy CARDIAC:RRR, no murmurs, rubs, gallops RESPIRATORY:  Clear to auscultation without rales, wheezing or rhonchi  ABDOMEN: Soft, non-tender, non-distended MUSCULOSKELETAL:  No edema; No deformity  SKIN: Warm and dry NEUROLOGIC:  Alert and oriented x 3 PSYCHIATRIC:  Normal affect    ASSESSMENT:    1. Chronic diastolic CHF (congestive heart failure) ([place])   2. Primary hypertension   3. Hypercholesterolemia   4. DM type 2, goal HbA1c < 7% ([facility])    PLAN:    In order of problems listed above:  1.  Chronic diastolic CHF -she appears euvolemic on exam today with no SOB or LE edema since I saw her last -she has not required any diuretic therapy  2.  HTN -BP is well controlled one exam today -continue  HCTZ 12.5mg  daily and Ramipril 10mg  daily -SCR is stable at 1.01 and K+ 3.8 on 03/27/2020  3.  HLD -LDL goal < 100 -followed by PCP -LDL was 116 in Aug -continue atorvastatin 20mg  daily  4.  DM2 -followed by PCP -HbA1C reviewed from PCP and was 6.1% in Jan 2022  Followup with me  in 1 year   Medication Adjustments/Labs and Tests Ordered: Current medicines are reviewed at length with the patient today.  Concerns regarding medicines are outlined above.  No orders of the defined types were placed in this encounter.  No orders of the defined types were placed in this encounter.   Signed, Fransico Him, MD  05/11/2020 2:43 PM    [place]

## 2020-05-20 DIAGNOSIS — E119 Type 2 diabetes mellitus without complications: Secondary | ICD-10-CM | POA: Diagnosis not present

## 2020-05-20 DIAGNOSIS — H524 Presbyopia: Secondary | ICD-10-CM | POA: Diagnosis not present

## 2020-05-20 DIAGNOSIS — Z7984 Long term (current) use of oral hypoglycemic drugs: Secondary | ICD-10-CM | POA: Diagnosis not present

## 2020-05-20 DIAGNOSIS — H52203 Unspecified astigmatism, bilateral: Secondary | ICD-10-CM | POA: Diagnosis not present

## 2020-05-20 DIAGNOSIS — Z961 Presence of intraocular lens: Secondary | ICD-10-CM | POA: Diagnosis not present

## 2020-05-20 DIAGNOSIS — H5213 Myopia, bilateral: Secondary | ICD-10-CM | POA: Diagnosis not present

## 2020-05-25 ENCOUNTER — Other Ambulatory Visit: Payer: Self-pay | Admitting: Cardiology

## 2020-05-28 ENCOUNTER — Ambulatory Visit: Payer: Medicare PPO | Admitting: Podiatry

## 2020-06-09 ENCOUNTER — Emergency Department (HOSPITAL_COMMUNITY): Payer: Medicare PPO

## 2020-06-09 ENCOUNTER — Observation Stay (HOSPITAL_COMMUNITY)
Admission: EM | Admit: 2020-06-09 | Discharge: 2020-06-11 | Disposition: A | Discharge status: Home / Self Care | Payer: Medicare PPO | Attending: Internal Medicine | Admitting: Internal Medicine

## 2020-06-09 ENCOUNTER — Encounter (HOSPITAL_COMMUNITY): Payer: Self-pay

## 2020-06-09 DIAGNOSIS — E7800 Pure hypercholesterolemia, unspecified: Secondary | ICD-10-CM | POA: Diagnosis not present

## 2020-06-09 DIAGNOSIS — N182 Chronic kidney disease, stage 2 (mild): Secondary | ICD-10-CM | POA: Insufficient documentation

## 2020-06-09 DIAGNOSIS — I6622 Occlusion and stenosis of left posterior cerebral artery: Secondary | ICD-10-CM | POA: Diagnosis not present

## 2020-06-09 DIAGNOSIS — E119 Type 2 diabetes mellitus without complications: Secondary | ICD-10-CM | POA: Diagnosis not present

## 2020-06-09 DIAGNOSIS — I10 Essential (primary) hypertension: Secondary | ICD-10-CM | POA: Diagnosis not present

## 2020-06-09 DIAGNOSIS — G459 Transient cerebral ischemic attack, unspecified: Principal | ICD-10-CM | POA: Insufficient documentation

## 2020-06-09 DIAGNOSIS — R4701 Aphasia: Secondary | ICD-10-CM | POA: Insufficient documentation

## 2020-06-09 DIAGNOSIS — E785 Hyperlipidemia, unspecified: Secondary | ICD-10-CM | POA: Diagnosis not present

## 2020-06-09 DIAGNOSIS — R29818 Other symptoms and signs involving the nervous system: Secondary | ICD-10-CM | POA: Diagnosis not present

## 2020-06-09 DIAGNOSIS — E1122 Type 2 diabetes mellitus with diabetic chronic kidney disease: Secondary | ICD-10-CM | POA: Insufficient documentation

## 2020-06-09 DIAGNOSIS — Z87891 Personal history of nicotine dependence: Secondary | ICD-10-CM | POA: Insufficient documentation

## 2020-06-09 DIAGNOSIS — I130 Hypertensive heart and chronic kidney disease with heart failure and stage 1 through stage 4 chronic kidney disease, or unspecified chronic kidney disease: Secondary | ICD-10-CM | POA: Diagnosis not present

## 2020-06-09 DIAGNOSIS — I639 Cerebral infarction, unspecified: Secondary | ICD-10-CM

## 2020-06-09 DIAGNOSIS — I5032 Chronic diastolic (congestive) heart failure: Secondary | ICD-10-CM | POA: Diagnosis not present

## 2020-06-09 DIAGNOSIS — R2681 Unsteadiness on feet: Secondary | ICD-10-CM | POA: Diagnosis not present

## 2020-06-09 DIAGNOSIS — E871 Hypo-osmolality and hyponatremia: Secondary | ICD-10-CM | POA: Insufficient documentation

## 2020-06-09 DIAGNOSIS — Z79899 Other long term (current) drug therapy: Secondary | ICD-10-CM | POA: Diagnosis not present

## 2020-06-09 DIAGNOSIS — Z20822 Contact with and (suspected) exposure to covid-19: Secondary | ICD-10-CM | POA: Diagnosis not present

## 2020-06-09 DIAGNOSIS — R404 Transient alteration of awareness: Secondary | ICD-10-CM | POA: Diagnosis not present

## 2020-06-09 DIAGNOSIS — R531 Weakness: Secondary | ICD-10-CM | POA: Diagnosis not present

## 2020-06-09 DIAGNOSIS — Z880 Allergy status to penicillin: Secondary | ICD-10-CM | POA: Diagnosis not present

## 2020-06-09 DIAGNOSIS — I6602 Occlusion and stenosis of left middle cerebral artery: Secondary | ICD-10-CM | POA: Diagnosis not present

## 2020-06-09 DIAGNOSIS — Z66 Do not resuscitate: Secondary | ICD-10-CM | POA: Diagnosis not present

## 2020-06-09 DIAGNOSIS — R29810 Facial weakness: Secondary | ICD-10-CM | POA: Diagnosis not present

## 2020-06-09 DIAGNOSIS — F0390 Unspecified dementia without behavioral disturbance: Secondary | ICD-10-CM | POA: Insufficient documentation

## 2020-06-09 DIAGNOSIS — E1169 Type 2 diabetes mellitus with other specified complication: Secondary | ICD-10-CM | POA: Diagnosis not present

## 2020-06-09 LAB — URINALYSIS, ROUTINE W REFLEX MICROSCOPIC
Bilirubin Urine: NEGATIVE
Glucose, UA: NEGATIVE mg/dL
Hgb urine dipstick: NEGATIVE
Ketones, ur: NEGATIVE mg/dL
Nitrite: NEGATIVE
Protein, ur: NEGATIVE mg/dL
Specific Gravity, Urine: 1.021 (ref 1.005–1.030)
pH: 7 (ref 5.0–8.0)

## 2020-06-09 LAB — COMPREHENSIVE METABOLIC PANEL
ALT: 12 U/L (ref 0–44)
AST: 18 U/L (ref 15–41)
Albumin: 3.5 g/dL (ref 3.5–5.0)
Alkaline Phosphatase: 69 U/L (ref 38–126)
Anion gap: 6 (ref 5–15)
BUN: 11 mg/dL (ref 8–23)
CO2: 25 mmol/L (ref 22–32)
Calcium: 9.1 mg/dL (ref 8.9–10.3)
Chloride: 98 mmol/L (ref 98–111)
Creatinine, Ser: 1.03 mg/dL — ABNORMAL HIGH (ref 0.44–1.00)
GFR, Estimated: 50 mL/min — ABNORMAL LOW (ref >60)
Glucose, Bld: 137 mg/dL — ABNORMAL HIGH (ref 70–99)
Potassium: 4 mmol/L (ref 3.5–5.1)
Sodium: 129 mmol/L — ABNORMAL LOW (ref 135–145)
Total Bilirubin: 0.6 mg/dL (ref 0.3–1.2)
Total Protein: 6.6 g/dL (ref 6.5–8.1)

## 2020-06-09 LAB — I-STAT CHEM 8, ED
BUN: 12 mg/dL (ref 8–23)
Calcium, Ion: 1.05 mmol/L — ABNORMAL LOW (ref 1.15–1.40)
Chloride: 97 mmol/L — ABNORMAL LOW (ref 98–111)
Creatinine, Ser: 1 mg/dL (ref 0.44–1.00)
Glucose, Bld: 134 mg/dL — ABNORMAL HIGH (ref 70–99)
HCT: 40 % (ref 36.0–46.0)
Hemoglobin: 13.6 g/dL (ref 12.0–15.0)
Potassium: 3.9 mmol/L (ref 3.5–5.1)
Sodium: 131 mmol/L — ABNORMAL LOW (ref 135–145)
TCO2: 25 mmol/L (ref 22–32)

## 2020-06-09 LAB — CBG MONITORING, ED: Glucose-Capillary: 138 mg/dL — ABNORMAL HIGH (ref 70–99)

## 2020-06-09 LAB — CBC
HCT: 39.6 % (ref 36.0–46.0)
Hemoglobin: 13.3 g/dL (ref 12.0–15.0)
MCH: 32 pg (ref 26.0–34.0)
MCHC: 33.6 g/dL (ref 30.0–36.0)
MCV: 95.4 fL (ref 80.0–100.0)
Platelets: 266 10*3/uL (ref 150–400)
RBC: 4.15 MIL/uL (ref 3.87–5.11)
RDW: 13.3 % (ref 11.5–15.5)
WBC: 6.8 10*3/uL (ref 4.0–10.5)
nRBC: 0 % (ref 0.0–0.2)

## 2020-06-09 LAB — DIFFERENTIAL
Abs Immature Granulocytes: 0.02 10*3/uL (ref 0.00–0.07)
Basophils Absolute: 0.1 10*3/uL (ref 0.0–0.1)
Basophils Relative: 1 %
Eosinophils Absolute: 0.1 10*3/uL (ref 0.0–0.5)
Eosinophils Relative: 1 %
Immature Granulocytes: 0 %
Lymphocytes Relative: 27 %
Lymphs Abs: 1.8 10*3/uL (ref 0.7–4.0)
Monocytes Absolute: 0.3 10*3/uL (ref 0.1–1.0)
Monocytes Relative: 5 %
Neutro Abs: 4.5 10*3/uL (ref 1.7–7.7)
Neutrophils Relative %: 66 %

## 2020-06-09 LAB — PROTIME-INR
INR: 1 (ref 0.8–1.2)
Prothrombin Time: 13 seconds (ref 11.4–15.2)

## 2020-06-09 LAB — APTT: aPTT: 32 seconds (ref 24–36)

## 2020-06-09 LAB — SARS CORONAVIRUS 2 (TAT 6-24 HRS): SARS Coronavirus 2: NEGATIVE

## 2020-06-09 MED ORDER — ENOXAPARIN SODIUM 40 MG/0.4ML ~~LOC~~ SOLN
40.0000 mg | SUBCUTANEOUS | Status: DC
  Administered 2020-06-10 – 2020-06-11 (×2): 40 mg via SUBCUTANEOUS
  Filled 2020-06-09 (×2): qty 0.4

## 2020-06-09 MED ORDER — SENNA 8.6 MG PO TABS: 1.0000 | ORAL_TABLET | Freq: Every evening | ORAL | Status: DC | PRN

## 2020-06-09 MED ORDER — IOHEXOL 350 MG/ML SOLN
75.0000 mL | Freq: Once | INTRAVENOUS | Status: AC | PRN
  Administered 2020-06-09: 75 mL via INTRAVENOUS

## 2020-06-09 MED ORDER — VITAMIN D 25 MCG (1000 UNIT) PO TABS
1000.0000 [IU] | ORAL_TABLET | Freq: Every day | ORAL | Status: DC
  Administered 2020-06-10 – 2020-06-11 (×2): 1000 [IU] via ORAL
  Filled 2020-06-09 (×2): qty 1

## 2020-06-09 MED ORDER — TRAZODONE HCL 50 MG PO TABS
25.0000 mg | ORAL_TABLET | Freq: Every evening | ORAL | Status: DC | PRN
  Administered 2020-06-10 (×2): 25 mg via ORAL
  Filled 2020-06-09 (×2): qty 1

## 2020-06-09 MED ORDER — CLOPIDOGREL BISULFATE 75 MG PO TABS
75.0000 mg | ORAL_TABLET | Freq: Every day | ORAL | Status: DC
  Administered 2020-06-10 – 2020-06-11 (×2): 75 mg via ORAL
  Filled 2020-06-09 (×2): qty 1

## 2020-06-09 MED ORDER — ACETAMINOPHEN 160 MG/5ML PO SOLN: 650.0000 mg | ORAL | Status: DC | PRN

## 2020-06-09 MED ORDER — MONTELUKAST SODIUM 10 MG PO TABS
10.0000 mg | ORAL_TABLET | Freq: Every day | ORAL | Status: DC
  Administered 2020-06-10: 10 mg via ORAL
  Filled 2020-06-09 (×2): qty 1

## 2020-06-09 MED ORDER — POTASSIUM CHLORIDE CRYS ER 10 MEQ PO TBCR
10.0000 meq | EXTENDED_RELEASE_TABLET | Freq: Every day | ORAL | Status: DC
  Administered 2020-06-10 – 2020-06-11 (×2): 10 meq via ORAL
  Filled 2020-06-09 (×2): qty 1

## 2020-06-09 MED ORDER — STROKE: EARLY STAGES OF RECOVERY BOOK
Freq: Once | Status: AC
  Filled 2020-06-09: qty 1

## 2020-06-09 MED ORDER — ACETAMINOPHEN 325 MG PO TABS: 650.0000 mg | ORAL_TABLET | ORAL | Status: DC | PRN

## 2020-06-09 MED ORDER — SODIUM CHLORIDE 0.9% FLUSH: 3.0000 mL | Freq: Once | INTRAVENOUS | Status: DC

## 2020-06-09 MED ORDER — ACETAMINOPHEN 650 MG RE SUPP: 650.0000 mg | RECTAL | Status: DC | PRN

## 2020-06-09 MED ORDER — ASPIRIN EC 81 MG PO TBEC
81.0000 mg | DELAYED_RELEASE_TABLET | Freq: Every day | ORAL | Status: DC
  Administered 2020-06-10: 81 mg via ORAL
  Filled 2020-06-09: qty 1

## 2020-06-09 MED ORDER — PANTOPRAZOLE SODIUM 40 MG PO TBEC
40.0000 mg | DELAYED_RELEASE_TABLET | Freq: Every day | ORAL | Status: DC
  Administered 2020-06-10 – 2020-06-11 (×2): 40 mg via ORAL
  Filled 2020-06-09 (×2): qty 1

## 2020-06-09 NOTE — H&P (Signed)
History and Physical    Brenda Lynch BWI:[id number] DOB: [birth year] DOA: 06/09/2020  PCP: [name], MD  Patient coming from: Home, son at bedside   I have personally briefly reviewed patient's old medical records in [place]  Chief Complaint: Moaning and fixed glazed   HPI: Brenda Lynch is a 87 y.o. female with medical history significant for dementia, meningioma, hypertension, diastolic heart failure, type 2 diabetes, iron deficiency anemia and CKD stage III who presents with concerns of moaning and fixed gaze.  Son at bedside who lives with her relates the history due to patient's dementia.  He states that around noon today after eating lunch she went to sleep on her recliner when he suddenly heard her moaning from the other room.  When he found her she seemed to have a fixed gaze and was looking "beyond him."  She was unable to turn her head towards his voice.  She was unable to speak.  Prior to this, she was otherwise in her normal state of health. She had other episodes where she cries out but usually complains of lower extremity weakness and son thinks it is because she is attention seeking.   She was last evaluated in the ED on 03/27/2020 for fatigue and weakness and had CT head at that time with no acute findings except for small calcified meningioma seen in prior imaging.  Has seen outpatient neurology in 01/2020 for morning headaches with MRI brain without contrast showed mild chronic microvascular ischemic changes typical for age and heavily calcified meningioma.  No contrast study was completed since patient requested and the study prior to image being obtained.  In the ED, patient presented initially as a code stroke and had initial NIHSS of 1 due to confusion.Glucose was normal.  Reportedly aphasic during CT scan but later returned back to baseline.  CT head negative for any acute findings.  CTA head and neck shows right posterior cerebral artery occlusion and other  moderate-high grade stenosis of the left PCA.  No TPA was given due to suspicion this was more likely a TIA.   Neurology has evaluated patient at bedside and recommended TIA and seizure work-up with hospitalist admission.  Review of Systems: Unable to fully obtain given patient's dementia  Past Medical History:  Diagnosis Date  . Allergic rhinitis   . Chronic diastolic CHF (congestive heart failure) ([place])   . CKD (chronic kidney disease), stage II   . Coronary artery disease   . Diabetes mellitus   . Diastolic dysfunction   . Diverticulosis   . Drug-induced bradycardia 01/28/2016  . Hypercholesterolemia   . Hypertension   . Hyponatremia   . Meningioma ([place])   . Osteoarthritis   . Sedentary lifestyle   . Spinal stenosis   . Vertigo, benign positional     Past Surgical History:  Procedure Laterality Date  . BACK SURGERY    . BREAST BIOPSY    . SKIN FULL THICKNESS GRAFT Right 11/08/2017   Procedure: RIGHT SMALL FINGER ABLATION OF NAIL ANDSKIN GRAFT FULL THICKNESS;  Surgeon: Leanora Cover, MD;  Location: [facility];  Service: Orthopedics;  Laterality: Right;  . TONSILLECTOMY       reports that she quit smoking about 41 years ago. She has never used smokeless tobacco. She reports that she does not drink alcohol and does not use drugs. Social History  Allergies  Allergen Reactions  . Ciprofloxacin Nausea Only and Other (See Comments)    GI intolerance  .  Penicillins Hives and Other (See Comments)    Welts, also Has patient had a PCN reaction causing immediate rash, facial/tongue/throat swelling, SOB or lightheadedness with hypotension: Yes Has patient had a PCN reaction causing severe rash involving mucus membranes or skin necrosis: No Has patient had a PCN reaction that required hospitalization No Has patient had a PCN reaction occurring within the last 10 years: No If all of the above answers are "NO", then may proceed with Cephalosporin use.  Marland Kitchen Penicillin G  Hives and Other (See Comments)    Welts on the body    Family History  Problem Relation Age of Onset  . Heart disease Mother   . Heart attack Mother   . Prostate cancer Father   . Lung cancer Brother   . Pancreatic cancer Brother   . Headache Neg Hx      Prior to Admission medications   Medication Sig Start Date End Date Taking? Authorizing Provider  acetaminophen (TYLENOL) 500 MG tablet Take 500 mg by mouth every 6 (six) hours as needed (for headaches or mild pain).   Yes [provider]  BAYER LOW DOSE 81 MG EC tablet Take 81 mg by mouth at bedtime. Swallow whole.   Yes [provider]  calcium carbonate (TUMS - DOSED IN MG ELEMENTAL CALCIUM) 500 MG chewable tablet Chew 1 tablet by mouth daily as needed for indigestion or heartburn.   Yes [provider]  calcium citrate-vitamin D (CITRACAL+D) 315-200 MG-UNIT per tablet Take 1 tablet by mouth daily.   Yes [provider]  cetirizine (ZYRTEC) 10 MG tablet Take 10 mg by mouth at bedtime as needed for allergies or rhinitis.    Yes [provider]  Chlorphen-Phenyleph-APAP (CORICIDIN D COLD/FLU/SINUS) 2-5-325 MG TABS Take 1 capsule by mouth every 6 (six) hours as needed (for symptoms).   Yes [provider]  Cholecalciferol (VITAMIN D3) 25 MCG (1000 UT) CAPS Take 1,000 Units by mouth daily.   Yes [provider]  diphenhydrAMINE (BENADRYL) 25 MG tablet Take 25 mg by mouth every 6 (six) hours as needed for allergies.   Yes [provider]  fluticasone (FLONASE) 50 MCG/ACT nasal spray Place 1-2 sprays into both nostrils daily as needed for allergies or rhinitis.   Yes [provider]  hydrochlorothiazide (MICROZIDE) 12.5 MG capsule TAKE (1) CAPSULE DAILY. Patient taking differently: Take 12.5 mg by mouth daily. 05/25/20  Yes Turner, Eber Hong, MD  hydrocortisone (ANUSOL-HC) 2.5 % rectal cream Place 1 application rectally 2 (two) times daily as needed for hemorrhoids  or anal itching.   Yes [provider]  hydrocortisone (ANUSOL-HC) 25 MG suppository Place 25 mg rectally 2 (two) times daily as needed for hemorrhoids or itching.    Yes [provider]  hydrOXYzine (ATARAX/VISTARIL) 25 MG tablet Take 25 mg by mouth at bedtime.   Yes [provider]  Misc Natural Products (OSTEO BI-FLEX ADV DOUBLE ST PO) Take 1 tablet by mouth daily.   Yes [provider]  montelukast (SINGULAIR) 10 MG tablet Take 10 mg by mouth at bedtime. 12/12/12  Yes [provider]  Polyethyl Glyc-Propyl Glyc PF (SYSTANE HYDRATION PF) 0.4-0.3 % SOLN Place 1 drop into both eyes 3 (three) times daily as needed (for irritation).   Yes [provider]  polyethylene glycol powder (GLYCOLAX/MIRALAX) 17 GM/SCOOP powder Take 8.5-17 g by mouth daily as needed for mild constipation (MIX AND DRINK).   Yes [provider]  potassium chloride (MICRO-K) 10 MEQ  CR capsule Take 10 mEq by mouth in the morning.  07/17/14  Yes [provider]  PRILOSEC OTC 20 MG tablet Take 20 mg by mouth daily before breakfast.   Yes [provider]  Probiotic Product (ALIGN) 4 MG CAPS Take 4 mg by mouth daily as needed (to support G.I. health).   Yes [provider]  ramipril (ALTACE) 10 MG capsule Take 10 mg by mouth in the morning.  10/06/19  Yes [provider]  Saline (SIMPLY SALINE) 0.9 % AERS Place 1 spray into both nostrils as needed (for congestion).   Yes [provider]  senna (SENOKOT) 8.6 MG TABS tablet Take 1 tablet by mouth at bedtime as needed for mild constipation.    Yes [provider]  traZODone (DESYREL) 50 MG tablet Take 25 mg by mouth at bedtime as needed for sleep. 09/08/19  Yes [provider]  clotrimazole (LOTRIMIN) 1 % cream Apply to both feet and between toes twice daily Patient taking differently: Apply 1 application topically See admin instructions. Apply to both feet and between  toes twice daily 02/05/18   Marzetta Board, DPM  estradiol (ESTRACE) 0.1 MG/GM vaginal cream Place 1 Applicatorful vaginally 2 (two) times a week. Monday and Friday     [provider]  Lancets [facility] ULTRASOFT) lancets 1 each by Other route as directed.  05/01/14   [provider]  ONE TOUCH ULTRA TEST test strip 1 each by Other route as directed.  06/21/14   [provider]    Physical Exam: Vitals:   06/09/20 1600 06/09/20 1630 06/09/20 1714 06/09/20 1926  BP: (!) 124/104 (!) 149/71 (!) 149/71 (!) 170/89  Pulse: 60 89 70 81  Resp: 20 17 15 18   Temp:   98 F (36.7 C)   TempSrc:   Oral   SpO2: 100% (!) 74% 99% 94%  Weight:      Height:        Constitutional: NAD, calm, comfortable, thin elderly female laying in bed at 40 decline incline Vitals:   06/09/20 1600 06/09/20 1630 06/09/20 1714 06/09/20 1926  BP: (!) 124/104 (!) 149/71 (!) 149/71 (!) 170/89  Pulse: 60 89 70 81  Resp: 20 17 15 18   Temp:   98 F (36.7 C)   TempSrc:   Oral   SpO2: 100% (!) 74% 99% 94%  Weight:      Height:       Eyes: PERRL, lids and conjunctivae normal ENMT: Mucous membranes are moist.  Neck: normal, supple Respiratory: clear to auscultation bilaterally, no wheezing, no crackles. Normal respiratory effort. No accessory muscle use.  Cardiovascular: Regular rate and rhythm, no murmurs / rubs / gallops. No extremity edema. 2+ pedal pulses.  Abdomen: no tenderness, no masses palpated.  Bowel sounds positive.  Musculoskeletal: no clubbing / cyanosis. No joint deformity upper and lower extremities. Good ROM, no contractures. Normal muscle tone.  Skin: no rashes, lesions, ulcers. No induration Neurologic: Alert and oriented only to self and family.  CN 2-12 grossly intact. Sensation intact,Strength 5/5 in all 4.  Intact finger-nose. Psychiatric: Dementia. Normal mood.     Labs on Admission: I have personally reviewed following labs and imaging studies  CBC: Recent  Labs  Lab 06/09/20 1437 06/09/20 1445  WBC 6.8  --   NEUTROABS 4.5  --   HGB 13.3 13.6  HCT 39.6 40.0  MCV 95.4  --   PLT 266  --    Basic Metabolic Panel:  Recent Labs  Lab 06/09/20 1437 06/09/20 1445  NA 129* 131*  K 4.0 3.9  CL 98 97*  CO2 25  --   GLUCOSE 137* 134*  BUN 11 12  CREATININE 1.03* 1.00  CALCIUM 9.1  --    GFR: Estimated Creatinine Clearance: 27.2 mL/min (by C-G formula based on SCr of 1 mg/dL). Liver Function Tests: Recent Labs  Lab 06/09/20 1437  AST 18  ALT 12  ALKPHOS 69  BILITOT 0.6  PROT 6.6  ALBUMIN 3.5   No results for input(s): LIPASE, AMYLASE in the last 168 hours. No results for input(s): AMMONIA in the last 168 hours. Coagulation Profile: Recent Labs  Lab 06/09/20 1437  INR 1.0   Cardiac Enzymes: No results for input(s): CKTOTAL, CKMB, CKMBINDEX, TROPONINI in the last 168 hours. BNP (last 3 results) No results for input(s): PROBNP in the last 8760 hours. HbA1C: No results for input(s): HGBA1C in the last 72 hours. CBG: Recent Labs  Lab 06/09/20 1439  GLUCAP 138*   Lipid Profile: No results for input(s): CHOL, HDL, LDLCALC, TRIG, CHOLHDL, LDLDIRECT in the last 72 hours. Thyroid Function Tests: No results for input(s): TSH, T4TOTAL, FREET4, T3FREE, THYROIDAB in the last 72 hours. Anemia Panel: No results for input(s): VITAMINB12, FOLATE, FERRITIN, TIBC, IRON, RETICCTPCT in the last 72 hours. Urine analysis:    Component Value Date/Time   COLORURINE STRAW (A) 06/09/2020 1708   APPEARANCEUR CLEAR 06/09/2020 1708   LABSPEC 1.021 06/09/2020 1708   PHURINE 7.0 06/09/2020 1708   GLUCOSEU NEGATIVE 06/09/2020 1708   HGBUR NEGATIVE 06/09/2020 1708   BILIRUBINUR NEGATIVE 06/09/2020 1708   KETONESUR NEGATIVE 06/09/2020 1708   PROTEINUR NEGATIVE 06/09/2020 1708   NITRITE NEGATIVE 06/09/2020 1708   LEUKOCYTESUR TRACE (A) 06/09/2020 1708    Radiological Exams on Admission: MR BRAIN WO CONTRAST  Result Date:  06/09/2020 CLINICAL DATA:  TIA.  Aphasia. EXAM: MRI HEAD WITHOUT CONTRAST TECHNIQUE: Multiplanar, multiecho pulse sequences of the brain and surrounding structures were obtained without intravenous contrast. COMPARISON:  MRI head 03/07/2020.  CT head 06/09/2020 FINDINGS: Brain: Limited study.  Patient was not able to complete the study. Negative for acute infarct. Moderate to advanced cerebral atrophy. No hydrocephalus. Calcified mass along the anterior falx compatible with meningioma measuring 13 mm. Vascular: Normal arterial flow voids. Skull and upper cervical spine: Negative Sinuses/Orbits: Paranasal sinuses clear. Bilateral cataract extraction Other: None IMPRESSION: Limited study.  Patient not able to complete the study Negative for acute infarct Extensive atrophy.  13 mm interhemispheric meningioma unchanged. Electronically Signed   By: Franchot Gallo M.D.   On: 06/09/2020 19:31   CT HEAD CODE STROKE WO CONTRAST  Result Date: 06/09/2020 CLINICAL DATA:  Code stroke.  Right facial droop, aphasia EXAM: CT HEAD WITHOUT CONTRAST TECHNIQUE: Contiguous axial images were obtained from the base of the skull through the vertex without intravenous contrast. COMPARISON:  03/27/2020 FINDINGS: Motion artifact is present. Brain: No acute intracranial hemorrhage, mass effect, or edema. No definite loss of gray-white differentiation. Prominence of the ventricles and sulci reflects generalized parenchymal volume loss. Patchy and confluent areas of hypoattenuation in the supratentorial white matter are nonspecific but probably reflect chronic microvascular ischemic changes. There is a small partially calcified mass along the anterior falx consistent with a meningioma. These findings are unchanged from the recent prior study. Vascular: No hyperdense vessel. There is intracranial atherosclerotic calcification at the skull base. Skull: Unremarkable. Sinuses/Orbits: No acute abnormality Other: Mastoid air cells are clear.  ASPECTS [facility] Stroke  Program Early CT Score) - Ganglionic level infarction (caudate, lentiform nuclei, internal capsule, insula, M1-M3 cortex): 7 - Supraganglionic infarction (M4-M6 cortex): 3 Total score (0-10 with 10 being normal): 10 IMPRESSION: Motion artifact is present. No acute intracranial hemorrhage or evidence of acute infarction. These results were communicated to Dr. Cheral Marker at 2:56 pm on 06/09/2020 by text page via the [facility] messaging system. Electronically Signed   By: Macy Mis M.D.   On: 06/09/2020 15:07   CT ANGIO HEAD CODE STROKE  Result Date: 06/09/2020 CLINICAL DATA:  Neuro deficit, acute, stroke suspected. Additional history provided: Last known normal 1200, right facial droop, bilateral weakness and difficulty speaking. EXAM: CT ANGIOGRAPHY HEAD AND NECK TECHNIQUE: Multidetector CT imaging of the head and neck was performed using the standard protocol during bolus administration of intravenous contrast. Multiplanar CT image reconstructions and MIPs were obtained to evaluate the vascular anatomy. Carotid stenosis measurements (when applicable) are obtained utilizing NASCET criteria, using the distal internal carotid diameter as the denominator. CONTRAST:  40mL OMNIPAQUE IOHEXOL 350 MG/ML SOLN COMPARISON:  Noncontrast head CT performed earlier the same day 06/09/2020. FINDINGS: CTA NECK FINDINGS Aortic arch: Atherosclerotic plaque within the visualized aortic arch and proximal major branch vessels of the neck. No hemodynamically significant innominate or proximal subclavian artery stenosis. Right carotid system: CCA and ICA patent within the neck without hemodynamically significant stenosis. Minimal atherosclerotic plaque within the carotid system within the neck. Tortuous cervical ICA. Additionally, the mid cervical ICA has a subtly beaded appearance. Left carotid system: CCA and ICA patent within the neck without hemodynamically significant stenosis. Minimal atherosclerotic plaque  within the carotid system within the neck. Tortuosity of the proximal cervical ICA. Vertebral arteries: Codominant and patent within the neck without stenosis. Skeleton: Cervical spondylosis with advanced multilevel disc space narrowing, disc bulges, posterior disc osteophytes, uncovertebral hypertrophy and facet arthrosis. C3-C4 grade 1 anterolisthesis. Other neck: No neck mass or cervical lymphadenopathy. Upper chest: No consolidation within the imaged lung apices. Centrilobular and paraseptal emphysema. Partially imaged patulous and debris-filled esophagus. Review of the MIP images confirms the above findings CTA HEAD FINDINGS Anterior circulation: The intracranial internal carotid arteries are patent. Nonstenotic calcified plaque within both vessels. The M1 middle cerebral arteries are patent. No M2 proximal branch occlusion or high-grade proximal stenosis is identified. The anterior cerebral arteries are patent. 1-2 mm inferiorly projecting vascular protrusions arising from the supraclinoid internal carotid arteries bilaterally which may reflect aneurysms or infundibula (series 9, image 93) (series 9, image 115). 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 Posterior circulation: The intracranial vertebral arteries are patent. The basilar artery is patent. The right posterior cerebral artery is occluded at the level of the P1 segment (series 10, image 22). The left posterior cerebral artery is patent. Moderate stenosis within the P1 left PCA (series 10, image 22). Moderate/severe stenosis within a left PCA branch vessel at the P2/P3 junction (series 12, image 25). Posterior communicating arteries are hypoplastic or absent bilaterally. Apparent high-grade stenosis within the proximal left superior cerebellar artery. Venous sinuses: Within the limitations of contrast timing, no convincing thrombus. Hypoplastic left transverse dural venous  sinus. Anatomic variants: As described Review of the MIP images confirms the above findings Emergent results were called by telephone at the time of interpretation on 06/09/2020 at 3:53 pm to provider [facility] , who verbally acknowledged these results. IMPRESSION: CTA neck: 1. The common carotid, internal carotid and vertebral arteries are  patent within the neck without hemodynamically significant stenosis. Minimal atherosclerotic plaque within the carotid systems within the neck. 2. The mid cervical right ICA has a subtly beaded appearance, nonspecific but potentially reflecting sequela of fibromuscular dysplasia. 3.  Aortic Atherosclerosis (ICD10-I70.0). 4.  Emphysema (ICD10-J43.9). CTA head: 1. The right posterior cerebral artery is occluded at the level of the P1 segment. 2. Moderate stenosis within the P1 left PCA. Additional moderate/severe stenosis within the left PCA at the P2/P3 junction. 3. High-grade stenosis within the proximal left superior cerebellar artery. 4. No anterior circulation intracranial large vessel occlusion identified. 5. 1-2 mm vascular protrusions arising from the supraclinoid internal carotid arteries bilaterally, which may reflect aneurysms or infundibula. 6. Ectasia of the left MCA trifurcation without discrete aneurysm at this time. Electronically Signed   By: Kellie Simmering DO   On: 06/09/2020 15:56   CT ANGIO NECK CODE STROKE  Result Date: 06/09/2020 CLINICAL DATA:  Neuro deficit, acute, stroke suspected. Additional history provided: Last known normal 1200, right facial droop, bilateral weakness and difficulty speaking. EXAM: CT ANGIOGRAPHY HEAD AND NECK TECHNIQUE: Multidetector CT imaging of the head and neck was performed using the standard protocol during bolus administration of intravenous contrast. Multiplanar CT image reconstructions and MIPs were obtained to evaluate the vascular anatomy. Carotid stenosis measurements (when applicable) are obtained utilizing  NASCET criteria, using the distal internal carotid diameter as the denominator. CONTRAST:  56mL OMNIPAQUE IOHEXOL 350 MG/ML SOLN COMPARISON:  Noncontrast head CT performed earlier the same day 06/09/2020. FINDINGS: CTA NECK FINDINGS Aortic arch: Atherosclerotic plaque within the visualized aortic arch and proximal major branch vessels of the neck. No hemodynamically significant innominate or proximal subclavian artery stenosis. Right carotid system: CCA and ICA patent within the neck without hemodynamically significant stenosis. Minimal atherosclerotic plaque within the carotid system within the neck. Tortuous cervical ICA. Additionally, the mid cervical ICA has a subtly beaded appearance. Left carotid system: CCA and ICA patent within the neck without hemodynamically significant stenosis. Minimal atherosclerotic plaque within the carotid system within the neck. Tortuosity of the proximal cervical ICA. Vertebral arteries: Codominant and patent within the neck without stenosis. Skeleton: Cervical spondylosis with advanced multilevel disc space narrowing, disc bulges, posterior disc osteophytes, uncovertebral hypertrophy and facet arthrosis. C3-C4 grade 1 anterolisthesis. Other neck: No neck mass or cervical lymphadenopathy. Upper chest: No consolidation within the imaged lung apices. Centrilobular and paraseptal emphysema. Partially imaged patulous and debris-filled esophagus. Review of the MIP images confirms the above findings CTA HEAD FINDINGS Anterior circulation: The intracranial internal carotid arteries are patent. Nonstenotic calcified plaque within both vessels. The M1 middle cerebral arteries are patent. No M2 proximal branch occlusion or high-grade proximal stenosis is identified. The anterior cerebral arteries are patent. 1-2 mm inferiorly projecting vascular protrusions arising from the supraclinoid internal carotid arteries bilaterally which may reflect aneurysms or infundibula (series 9, image 93)  (series 9, image 115). 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 Posterior circulation: The intracranial vertebral arteries are patent. The basilar artery is patent. The right posterior cerebral artery is occluded at the level of the P1 segment (series 10, image 22). The left posterior cerebral artery is patent. Moderate stenosis within the P1 left PCA (series 10, image 22). Moderate/severe stenosis within a left PCA branch vessel at the P2/P3 junction (series 12, image 25). Posterior communicating arteries are hypoplastic or absent bilaterally. Apparent high-grade stenosis within the proximal left superior cerebellar artery. Venous  sinuses: Within the limitations of contrast timing, no convincing thrombus. Hypoplastic left transverse dural venous sinus. Anatomic variants: As described Review of the MIP images confirms the above findings Emergent results were called by telephone at the time of interpretation on 06/09/2020 at 3:53 pm to provider [facility] , who verbally acknowledged these results. IMPRESSION: CTA neck: 1. The common carotid, internal carotid and vertebral arteries are patent within the neck without hemodynamically significant stenosis. Minimal atherosclerotic plaque within the carotid systems within the neck. 2. The mid cervical right ICA has a subtly beaded appearance, nonspecific but potentially reflecting sequela of fibromuscular dysplasia. 3.  Aortic Atherosclerosis (ICD10-I70.0). 4.  Emphysema (ICD10-J43.9). CTA head: 1. The right posterior cerebral artery is occluded at the level of the P1 segment. 2. Moderate stenosis within the P1 left PCA. Additional moderate/severe stenosis within the left PCA at the P2/P3 junction. 3. High-grade stenosis within the proximal left superior cerebellar artery. 4. No anterior circulation intracranial large vessel occlusion identified. 5. 1-2 mm vascular protrusions arising from the  supraclinoid internal carotid arteries bilaterally, which may reflect aneurysms or infundibula. 6. Ectasia of the left MCA trifurcation without discrete aneurysm at this time. Electronically Signed   By: Kellie Simmering DO   On: 06/09/2020 15:56      Assessment/Plan  Aphasia/fixed gaze Neurology is following and has evaluated. Continue work-up for TIA and seizure MRI brain unsuccessful since patient has anxiety with loud noise.  Son wants to hold off and does not want any antianxiolytics at this time. echocardiogram  continue daily aspirin and atorvastatin Start Plavix 75mg  per neurology  -Obtain A1c and lipids -PT/OT/SLT -Frequent neuro checks and keep on telemetry -Allow for permissive hypertension with blood pressure treatment as needed only if systolic goes above 035 -obtain EEG   Chronic diastolic heart failure Appears euvolemic on exam with no shortness of breath or edema Not on any chronic diuretic therapy  Hypertension Hold home antihypertensives for TIA work-up but will treat if systolic greater than 009 per neurology due to advanced age  Hyperlipidemia Check Lipid panel Continue atorvastatin 20 mg  Type 2 diabetes Pt recently was taken off metformin due to controlled HbA1C repeat HbA1C   Dementia baseline knows self and family. Able to feed and dress herself. Needs assistance with bathing and medications which is provided by Son who lives with her.   DVT prophylaxis:.Lovenox Code Status: DNR Family Communication: Plan discussed with son at bedside  disposition Plan: Home with observation Consults called: Neurology Admission status: Observation   Level of care: Telemetry Cardiac  Status is: Observation  The patient remains OBS appropriate and will d/c before 2 midnights.  Dispo: The patient is from: Home              Anticipated d/c is to: Home              Patient currently is not medically stable to d/c.   Difficult to place patient No          Orene Desanctis DO [facility]   If 7PM-7AM, please contact night-coverage [email or web address]   06/09/2020, 8:01 PM

## 2020-06-09 NOTE — Progress Notes (Signed)
Stroke Response Nurse Documentation Code Stroke Documentation Roman B Kaminsky  is a 87 y.o.  y.o. female  arriving to [facility] ED via [facility] on 06/09/2020 with PMH of CHF, CAD, HTN, DM, CKD, Meningioma. Code stroke was activated by EMS. Patient from home where she was LKW at 1200 and now complaining of AMS. Patient taking aspirin 81 mg daily PTA. Stroke team at the bedside on patient arrival, labs drawn and patient cleared for CT by EDP. Patient taken to CT with team. NIHSS 1, see documentation for details and code stroke times. Patient with disoriented on exam. The following imaging was completed:  CT, CTA head and neck. Patient is not a candidate for tPA per Dr. Cheral Marker. Care/Plan: q2h neuro chekcks. Bedside handoff with ED RN Ma Katrina.    Lenore Manner  Stroke Response RN [phone]

## 2020-06-09 NOTE — ED Notes (Signed)
Pt arrived in ED at 1434 due to stroke like symptoms. Family reports LKN at 12PM with symptoms discovered at 1350 of right facial drooping and confusion. Pt was observed staring at the walls per family. EMS arrived to pt home at 1402. Blood sugar was 137. Pt taken to CT at 1440. Labs drawn prior to CT at 1436. Initial NIHSS was 1 due to confusion. No facial drooping observed. Will continue to monitor.

## 2020-06-09 NOTE — ED Notes (Signed)
No TPA per neurology.

## 2020-06-09 NOTE — Consult Note (Signed)
Neurology consult   CC: Code stroke  History is obtained from: EMS, son, chart  HPI: Brenda Lynch is a 87 y.o. female with a PMHx of CdHF, CKD II, CAD, DM II, HLD, chronic menigioma, cognitive disorder, OA, BPV, and HTN. Patient was at home today and was making lunch. After she ate, around 1215 hours, she began to have a right facial droop, slurred speech, and trouble finding her words. She also cried out prior to this event and was staring with glazed eyes. Son called 41 and patient was activated as a code stroke. CBG 137 and BP 156/74 in the field.   After a brief exam on the ED bridge, patient was taken emergently to CT suite. She could not lift her legs off the stretcher (of note, she had an ED visit on 03/27/20 for generalized weakness and workup showed no etiology for complaint). NIH score in the ED was 6. She was noted to be aphasic, almost mute, in CT. After CT, she was talking fluently. CTH showed some motion artifact with no acute intracranial hemorrhage or evidence of acute infarction. ASPECTS was 10.   CTA head and neck showed right posterior cerebral artery occlusion at the level of the P1 segment; additional findings appearing most consistent with chronic intracranial atherosclerosis were also noted.  Patient is not a candidate for tPA based on overall impression that presentation is secondary to TIA on underlying cognitive impairment rather than left MCA infarction. Patient is on no antiplatelet other than ASA 81 mg po qd. She is not on AC.   In review of chart, patient was seen at [facility] at request of PCP for HA. Tests were ordered and NP does not see a f/up note. MRI brain at that time showed mild to moderate generalized cortical atrophy, mild chronic microvascul ischemic changes, typical for age and 100mm mass of the anterior falx cerebri consistent with a heavily calcified meningioma.   Patient's f/up exam in ED showed a NIHSS of 0.   .  tpa given?: No, not a candidate  (see above) IR Thrombectomy? No, exam findings not consistent with LVO mRS 3  NIHSS:  1a Level of Conscious: 0                                     Follow up NIHSS: 0 1b LOC Questions: 2 1c LOC Commands: 1 2 Best Gaze: 0 3 Visual: 0 4 Facial Palsy: 1 5a Motor Arm - left: 0 5b Motor Arm - Right: 0 6a Motor Leg - Left: 0 6b Motor Leg - Right: 0 7 Limb Ataxia: 0 8 Sensory: 0 9 Best Language: 1 10 Dysarthria: 1 11 Extinct. and Inatten: 0 TOTAL: 6  ROS: unable to complete complete ROS due to emergent nature of event and cognitive impairment  Past Medical History:  Diagnosis Date  . Allergic rhinitis   . Chronic diastolic CHF (congestive heart failure) ([place])   . CKD (chronic kidney disease), stage II   . Coronary artery disease   . Diabetes mellitus   . Diastolic dysfunction   . Diverticulosis   . Drug-induced bradycardia 01/28/2016  . Hypercholesterolemia   . Hypertension   . Hyponatremia   . Meningioma ([place])   . Osteoarthritis   . Sedentary lifestyle   . Spinal stenosis   . Vertigo, benign positional      Family History  Problem Relation Age of Onset  .  Heart disease Mother   . Heart attack Mother   . Prostate cancer Father   . Lung cancer Brother   . Pancreatic cancer Brother   . Headache Neg Hx     Social History:  reports that she quit smoking about 41 years ago. She has never used smokeless tobacco. She reports that she does not drink alcohol and does not use drugs.   Prior to Admission medications   Medication Sig Start Date End Date Taking? Authorizing Provider  gabapentin (NEURONTIN) 100 MG capsule Take 100 mg by mouth in the morning.   Yes [provider]  acetaminophen (TYLENOL) 500 MG tablet Take 500 mg by mouth every 6 (six) hours as needed (for hadaches or mild pain).     [provider]  aspirin EC 81 MG tablet Take 81 mg by mouth at bedtime.     [provider]  atorvastatin (LIPITOR) 20 MG tablet Take 20 mg by mouth  daily.  07/17/14   [provider]  calcium carbonate (TUMS - DOSED IN MG ELEMENTAL CALCIUM) 500 MG chewable tablet Chew 1 tablet by mouth daily as needed for indigestion or heartburn.    [provider]  calcium citrate-vitamin D (CITRACAL+D) 315-200 MG-UNIT per tablet Take 1 tablet by mouth 2 (two) times daily.    [provider]  cetirizine (ZYRTEC) 10 MG tablet Take 10 mg by mouth at bedtime as needed for allergies or rhinitis.     [provider]  Cholecalciferol (VITAMIN D-3 PO) Take 1 capsule by mouth in the morning.    [provider]  clotrimazole (LOTRIMIN) 1 % cream Apply to both feet and between toes twice daily Patient taking differently: Apply 1 application topically See admin instructions. Apply to both feet and between toes twice daily 02/05/18   Marzetta Board, DPM  diphenhydrAMINE (BENADRYL) 25 mg capsule Take 25 mg by mouth in the morning.    [provider]  estradiol (ESTRACE) 0.1 MG/GM vaginal cream Place 1 Applicatorful vaginally 2 (two) times a week. Monday and Friday     [provider]  hydrochlorothiazide (MICROZIDE) 12.5 MG capsule TAKE (1) CAPSULE DAILY. Patient taking differently: Take 12.5 mg by mouth daily. 05/25/20   Sueanne Margarita, MD  hydrocortisone (ANUSOL-HC) 25 MG suppository Place 25 mg rectally 2 (two) times daily as needed for hemorrhoids or itching.     [provider]  indomethacin (INDOCIN) 50 MG capsule Take 50 mg by mouth 2 (two) times daily as needed (pain). Take with food or milk     [provider]  Lancets (ONETOUCH ULTRASOFT) lancets 1 each by Other route as directed.  05/01/14   [provider]  Misc Natural Products (OSTEO BI-FLEX ADV DOUBLE ST PO) Take 1 tablet by mouth daily.    [provider]  montelukast (SINGULAIR) 10 MG tablet Take 10 mg by mouth at bedtime.  12/12/12   [provider]  omeprazole (PRILOSEC OTC) 20 MG tablet Take 20  mg by mouth daily before breakfast.     [provider]  ONE TOUCH ULTRA TEST test strip 1 each by Other route as directed.  06/21/14   [provider]  Polyethyl Glyc-Propyl Glyc PF (SYSTANE HYDRATION PF) 0.4-0.3 % SOLN Place 1 drop into both eyes 3 (three) times daily as needed (for itching or irritation).    [provider]  polyethylene glycol powder (GLYCOLAX/MIRALAX) 17 GM/SCOOP powder Take 8.5 g by mouth daily as needed for  mild constipation (MIX AND DRINK).    [provider]  potassium chloride (MICRO-K) 10 MEQ CR capsule Take 10 mEq by mouth in the morning.  07/17/14   [provider]  ramipril (ALTACE) 10 MG capsule Take 10 mg by mouth in the morning.  10/06/19   [provider]  Saline (SIMPLY SALINE) 0.9 % AERS Place 1 spray into both nostrils as needed (for congestion).    [provider]  senna (SENOKOT) 8.6 MG TABS tablet Take 1 tablet by mouth at bedtime as needed for mild constipation.     [provider]  traZODone (DESYREL) 50 MG tablet Take 25 mg by mouth at bedtime. 09/08/19   [provider]  triamcinolone (NASACORT) 55 MCG/ACT AERO nasal inhaler Place 2 sprays into the nose daily.     [provider]    Exam: Current vital signs: BP (!) 148/75   Pulse 60   Temp 97.9 F (36.6 C) (Oral)   Resp 11   Ht 5\' 2"  (1.575 m)   Wt 53.8 kg   SpO2 96%   BMI 21.67 kg/m   Physical Exam  Constitutional: Appears well-developed and well-nourished. Slightly frail appearing.  Psych: Affect appropriate to situation Eyes: No scleral injection HENT: No OP obstrucion Head: Normocephalic.  Cardiovascular: Normal rate and regular rhythm.  Respiratory: Effort normal GI: Soft.  No distension. There is no tenderness.  Skin: WDI and well perfused  Neuro: Mental Status: Patient is awake, alert, perseverates on questions.  Patient is unable to give a clear and coherent history. Speech is sparse but  does not fit a Broca's aphasia pattern.  Naming of thumb and pinky finger intact. Cannot name an index finger.  Partially oriented.  No signs of neglect.  Complex commands are limited by cognitive impairment, but will follow all simple motor commands.  Cranial Nerves: II: Visual Fields are full. Pupils are equal, round, and reactive to light.  III,IV, VI: EOMI without ptosis or diplopia.  V: Facial sensation is symmetric to light touch VII: Face with mild left facial droop in CT, later, none.  VIII: hearing is intact to voice. X: Hypophonic speech XI: Shoulder shrug is symmetric. XII: Tongue is midline without atrophy or fasciculations.  Motor: Tone is normal. Bulk is decreased. 5/5 strength was present in all four extremities.  No pronator drift.  Sensory: Sensation is symmetric to light touch in the arms and legs.  Reflexes: Symmetric.  Plantars: Toes are downgoing bilaterally.  Cerebellar: FNF is intact bilaterally.  Gait: Deferred  I have reviewed labs in epic and the pertinent results are: INR   1.0   APTT  32    Na 129 up to 131  Creat 1.0   MD reviewed the images obtained:  CT head  Some motion artifact with no acute intracranial hemorrhage or evidence of acute infarction. ASPECTS 10.   CTA neck 1. The common carotid, internal carotid and vertebral arteries are patent within the neck without hemodynamically significant stenosis. Minimal atherosclerotic plaque within the carotid systems within the neck. 2. The mid cervical right ICA has a subtly beaded appearance, nonspecific but potentially reflecting sequela of fibromuscular dysplasia. 3.  Aortic Atherosclerosis (ICD10-I70.0). 4.  Emphysema (ICD10-J43.9).  CTA head: 1. The right posterior cerebral artery is occluded at the level of the P1 segment. 2. Moderate stenosis within the P1 left PCA. Additional moderate/severe stenosis within the left PCA at the P2/P3 junction. 3. High-grade stenosis within the  proximal left superior cerebellar  artery. 4. No anterior circulation intracranial large vessel occlusion identified. 5. 1-2 mm vascular protrusions arising from the supraclinoid internal carotid arteries bilaterally, which may reflect aneurysms or infundibula. 6. Ectasia of the left MCA trifurcation without discrete aneurysm at this time.  Assessment: 87 yo female who presented as a Code Stroke per EMS due to aphasia at home. She also cried out and had a glazed look. No history of seizures. She lives with her son and her MRS is 3. CT and CTA showed no acute finding, no LVO. Not a candidate for tPA due to history of chronic meningioma. No IR due to no LVO. Her NIHSS was 6 on arrival and down to 0 back in ED room.  - Exam in the ED is more consistent with AMS than stroke, although TIA on possible background of baseline cognitive impairment is possible.  - Not a tPA candidate based on overall impression that presentation is secondary to TIA on underlying cognitive impairment rather than left MCA infarction - CT head: Motion artifact is present. No acute intracranial hemorrhage or evidence of acute infarction. - CTA of head: The right posterior cerebral artery is occluded at the level of the P1 segment. Additional findings appearing most consistent with chronic intracranial atherosclerosis are also noted.  - CTA of neck: The common carotid, internal carotid and vertebral arteries are patent within the neck without hemodynamically significant stenosis. Minimal atherosclerotic plaque within the carotid systems within the neck. The mid cervical right ICA has a subtly beaded appearance, nonspecific but potentially reflecting sequela of fibromuscular dysplasia. - Thrombectomy not indicated for right PCA occlusion as it is asymptomatic.   Recommendations: - MRI brain without contrast. - Recommend TTE. - Recommend labs: HbA1c, lipid panel, TSH. - Strict conformance to guidelines would suggest that statin  is indicated if LDL > 70. However, risks most likely outweigh potential benefits due to advanced age of the patient.   - Continue Aspirin 81 mg daily. - Add Plavix 75mg  po qd x at least one month due to ABCD2 score of 4. Due to severe intracranial atherosclerosis, may need to continue DAPT indefinitely - SBP goal - Permissive hypertension. Modified protocol given advanced age: For first 24 h treat SBP if > 180. Hold home medications for now. - Telemetry monitoring for arrhythmia. - Recommend bedside Swallow screen. - Recommend Stroke education. - Recommend PT/OT/SLP consult. - rEEG to eval for any epileptogenic discharges given pre event cry out and staring with glazed eyes. - Recommend metabolic/infectious workup with CBC, CMP, UA with UCx, CXR, CK, serum lactate.  This patient is critically ill and at significant risk of neurological worsening, death and care requires constant monitoring of vital signs, hemodynamics,respiratory and cardiac monitoring, neurological assessment, discussion with family, other specialists and medical decision making of high complexity. I spent 40 minutes of neurocritical care time  in the care of  this patient. This was time spent independent of any time provided by nurse practitioner or PA.  Electronically signed by: Clance Boll, MSN, APN-BC, nurse practitioner and by MD. Note/plan to be edited by MD as needed.  Pager: [phone]  I have seen and examined the patient. I have formulated the assessment and recommendations. 87 yo female who presented as a Code Stroke per EMS due to aphasia at home. She also cried out and had a glazed look. No history of seizures. She lives with her son and her MRS is 3. CT and CTA showed no acute finding, no LVO. Not a candidate for  tPA based on overall impression that presentation is secondary to TIA on underlying cognitive impairment rather than left MCA infarction. Her NIHSS was 6 on arrival. No IR due to improvement of NIHSS to a  score of 0.   Electronically signed: Dr. Kerney Elbe

## 2020-06-09 NOTE — ED Provider Notes (Signed)
[facility] EMERGENCY DEPARTMENT Provider Note   CSN: [id number] Arrival date & time: 06/09/20  1434  An emergency department physician performed an initial assessment on this suspected stroke patient at 1440.  History Chief Complaint  Patient presents with  . Code Stroke    Brenda Lynch is a 87 y.o. female.  HPI   87 year old female with past medical history of CAD, CHF, HTN, HLD, DM, CKD presents the emergency department as a stroke page.  Patient arrived just prior to the start of my shift.  Initially evaluated at the EMS bridge by previous ER provider, airway cleared.  Neuro team at bedside.  Reportedly the patient was last known normal around 12 PM prior to taking a nap.  The son reports that when she woke up around 130 that she was staring straight, when localized to him, he thought he saw facial drooping and she was also confused.  On arrival patient was given an NIH of 6 for nonsensical speech and weakness.  Patient went to the CT scanner with the neurology team.  Past Medical History:  Diagnosis Date  . Allergic rhinitis   . Chronic diastolic CHF (congestive heart failure) ([place])   . CKD (chronic kidney disease), stage II   . Coronary artery disease   . Diabetes mellitus   . Diastolic dysfunction   . Diverticulosis   . Drug-induced bradycardia 01/28/2016  . Hypercholesterolemia   . Hypertension   . Hyponatremia   . Meningioma ([place])   . Osteoarthritis   . Sedentary lifestyle   . Spinal stenosis   . Vertigo, benign positional     Patient Active Problem List   Diagnosis Date Noted  . Presbycusis of both ears 09/14/2016  . Drug-induced bradycardia 01/28/2016  . Cephalalgia 09/16/2015  . Orthostatic dizziness 09/16/2015  . Post-nasal drainage 09/16/2015  . SOB (shortness of breath) 07/29/2013  . Hypertension   . Diastolic dysfunction   . Hypercholesterolemia   . Diabetes mellitus   . Chronic diastolic CHF (congestive heart failure) ([place])      Past Surgical History:  Procedure Laterality Date  . BACK SURGERY    . BREAST BIOPSY    . SKIN FULL THICKNESS GRAFT Right 11/08/2017   Procedure: RIGHT SMALL FINGER ABLATION OF NAIL ANDSKIN GRAFT FULL THICKNESS;  Surgeon: Leanora Cover, MD;  Location: [facility];  Service: Orthopedics;  Laterality: Right;  . TONSILLECTOMY       OB History   No obstetric history on file.     Family History  Problem Relation Age of Onset  . Heart disease Mother   . Heart attack Mother   . Prostate cancer Father   . Lung cancer Brother   . Pancreatic cancer Brother   . Headache Neg Hx     Social History   Tobacco Use  . Smoking status: Former Smoker    Quit date: 01/30/1979    Years since quitting: 41.3  . Smokeless tobacco: Never Used  Vaping Use  . Vaping Use: Never used  Substance Use Topics  . Alcohol use: No  . Drug use: No    Home Medications Prior to Admission medications   Medication Sig Start Date End Date Taking? Authorizing Provider  gabapentin (NEURONTIN) 100 MG capsule Take 100 mg by mouth in the morning.   Yes [provider]  acetaminophen (TYLENOL) 500 MG tablet Take 500 mg by mouth every 6 (six) hours as needed (for hadaches or mild pain).  [provider]  aspirin EC 81 MG tablet Take 81 mg by mouth at bedtime.     [provider]  atorvastatin (LIPITOR) 20 MG tablet Take 20 mg by mouth daily.  07/17/14   [provider]  calcium carbonate (TUMS - DOSED IN MG ELEMENTAL CALCIUM) 500 MG chewable tablet Chew 1 tablet by mouth daily as needed for indigestion or heartburn.    [provider]  calcium citrate-vitamin D (CITRACAL+D) 315-200 MG-UNIT per tablet Take 1 tablet by mouth 2 (two) times daily.    [provider]  cetirizine (ZYRTEC) 10 MG tablet Take 10 mg by mouth at bedtime as needed for allergies or rhinitis.     [provider]  Cholecalciferol (VITAMIN D-3 PO) Take 1 capsule by  mouth in the morning.    [provider]  clotrimazole (LOTRIMIN) 1 % cream Apply to both feet and between toes twice daily Patient taking differently: Apply 1 application topically See admin instructions. Apply to both feet and between toes twice daily 02/05/18   Marzetta Board, DPM  diphenhydrAMINE (BENADRYL) 25 mg capsule Take 25 mg by mouth in the morning.    [provider]  estradiol (ESTRACE) 0.1 MG/GM vaginal cream Place 1 Applicatorful vaginally 2 (two) times a week. Monday and Friday     [provider]  hydrochlorothiazide (MICROZIDE) 12.5 MG capsule TAKE (1) CAPSULE DAILY. Patient taking differently: Take 12.5 mg by mouth daily. 05/25/20   Sueanne Margarita, MD  hydrocortisone (ANUSOL-HC) 25 MG suppository Place 25 mg rectally 2 (two) times daily as needed for hemorrhoids or itching.     [provider]  indomethacin (INDOCIN) 50 MG capsule Take 50 mg by mouth 2 (two) times daily as needed (pain). Take with food or milk     [provider]  Lancets (ONETOUCH ULTRASOFT) lancets 1 each by Other route as directed.  05/01/14   [provider]  Misc Natural Products (OSTEO BI-FLEX ADV DOUBLE ST PO) Take 1 tablet by mouth daily.    [provider]  montelukast (SINGULAIR) 10 MG tablet Take 10 mg by mouth at bedtime.  12/12/12   [provider]  omeprazole (PRILOSEC OTC) 20 MG tablet Take 20 mg by mouth daily before breakfast.     [provider]  ONE TOUCH ULTRA TEST test strip 1 each by Other route as directed.  06/21/14   [provider]  Polyethyl Glyc-Propyl Glyc PF (SYSTANE HYDRATION PF) 0.4-0.3 % SOLN Place 1 drop into both eyes 3 (three) times daily as needed (for itching or irritation).    [provider]  polyethylene glycol powder (GLYCOLAX/MIRALAX) 17 GM/SCOOP powder Take 8.5 g by mouth daily as needed for mild constipation (MIX AND DRINK).    [provider]  potassium chloride  (MICRO-K) 10 MEQ CR capsule Take 10 mEq by mouth in the morning.  07/17/14   [provider]  ramipril (ALTACE) 10 MG capsule Take 10 mg by mouth in the morning.  10/06/19   [provider]  Saline (SIMPLY SALINE) 0.9 % AERS Place 1 spray into both nostrils as needed (for congestion).    [provider]  senna (SENOKOT) 8.6 MG TABS tablet Take 1 tablet by mouth at bedtime as needed for mild constipation.     [provider]  traZODone (DESYREL) 50 MG tablet Take 25 mg by mouth at bedtime. 09/08/19   [provider]  triamcinolone (NASACORT) 55 MCG/ACT AERO nasal inhaler  Place 2 sprays into the nose daily.     [provider]    Allergies    Ciprofloxacin, Penicillins, and Penicillin g  Review of Systems   Review of Systems  Unable to perform ROS: Mental status change    Physical Exam Updated Vital Signs BP (!) 148/75   Pulse 60   Temp 97.9 F (36.6 C) (Oral)   Resp 11   Ht 5\' 2"  (1.575 m)   Wt 53.8 kg   SpO2 96%   BMI 21.67 kg/m   Physical Exam Vitals and nursing note reviewed.  Constitutional:      Appearance: Normal appearance.  HENT:     Head: Normocephalic.     Mouth/Throat:     Mouth: Mucous membranes are moist.  Eyes:     Extraocular Movements: Extraocular movements intact.     Pupils: Pupils are equal, round, and reactive to light.  Cardiovascular:     Rate and Rhythm: Normal rate.  Pulmonary:     Effort: Pulmonary effort is normal. No respiratory distress.  Abdominal:     Palpations: Abdomen is soft.     Tenderness: There is no abdominal tenderness.  Musculoskeletal:        General: No deformity.  Skin:    General: Skin is warm.  Neurological:     Mental Status: She is alert and oriented to person, place, and time. Mental status is at baseline.     Motor: No weakness.  Psychiatric:        Mood and Affect: Mood normal.     ED Results / Procedures / Treatments   Labs (all labs ordered are listed, but  only abnormal results are displayed) Labs Reviewed  COMPREHENSIVE METABOLIC PANEL - Abnormal; Notable for the following components:      Result Value   Sodium 129 (*)    Glucose, Bld 137 (*)    Creatinine, Ser 1.03 (*)    GFR, Estimated 50 (*)    All other components within normal limits  I-STAT CHEM 8, ED - Abnormal; Notable for the following components:   Sodium 131 (*)    Chloride 97 (*)    Glucose, Bld 134 (*)    Calcium, Ion 1.05 (*)    All other components within normal limits  CBG MONITORING, ED - Abnormal; Notable for the following components:   Glucose-Capillary 138 (*)    All other components within normal limits  SARS CORONAVIRUS 2 (TAT 6-24 HRS)  PROTIME-INR  APTT  CBC  DIFFERENTIAL    EKG EKG Interpretation  Date/Time:  Wednesday June 09 2020 15:09:04 EDT Ventricular Rate:  64 PR Interval:    QRS Duration: 96 QT Interval:  421 QTC Calculation: 435 R Axis:   33 Text Interpretation: Sinus rhythm Prolonged PR interval Abnrm T, consider ischemia, anterolateral lds NSR,  St and T wave changes old Confirmed by Lavenia Atlas [phone]) on 06/09/2020 3:42:40 PM   Radiology CT HEAD CODE STROKE WO CONTRAST  Result Date: 06/09/2020 CLINICAL DATA:  Code stroke.  Right facial droop, aphasia EXAM: CT HEAD WITHOUT CONTRAST TECHNIQUE: Contiguous axial images were obtained from the base of the skull through the vertex without intravenous contrast. COMPARISON:  03/27/2020 FINDINGS: Motion artifact is present. Brain: No acute intracranial hemorrhage, mass effect, or edema. No definite loss of gray-white differentiation. Prominence of the ventricles and sulci reflects generalized parenchymal volume loss. Patchy and confluent areas of hypoattenuation in the supratentorial white matter are nonspecific but probably reflect chronic microvascular ischemic  changes. There is a small partially calcified mass along the anterior falx consistent with a meningioma. These findings are unchanged  from the recent prior study. Vascular: No hyperdense vessel. There is intracranial atherosclerotic calcification at the skull base. Skull: Unremarkable. Sinuses/Orbits: No acute abnormality Other: Mastoid air cells are clear. ASPECTS [facility] Stroke Program Early CT Score) - Ganglionic level infarction (caudate, lentiform nuclei, internal capsule, insula, M1-M3 cortex): 7 - Supraganglionic infarction (M4-M6 cortex): 3 Total score (0-10 with 10 being normal): 10 IMPRESSION: Motion artifact is present. No acute intracranial hemorrhage or evidence of acute infarction. These results were communicated to Dr. Cheral Marker at 2:56 pm on 06/09/2020 by text page via the [facility] messaging system. Electronically Signed   By: Macy Mis M.D.   On: 06/09/2020 15:07   CT ANGIO HEAD CODE STROKE  Result Date: 06/09/2020 CLINICAL DATA:  Neuro deficit, acute, stroke suspected. Additional history provided: Last known normal 1200, right facial droop, bilateral weakness and difficulty speaking. EXAM: CT ANGIOGRAPHY HEAD AND NECK TECHNIQUE: Multidetector CT imaging of the head and neck was performed using the standard protocol during bolus administration of intravenous contrast. Multiplanar CT image reconstructions and MIPs were obtained to evaluate the vascular anatomy. Carotid stenosis measurements (when applicable) are obtained utilizing NASCET criteria, using the distal internal carotid diameter as the denominator. CONTRAST:  17mL OMNIPAQUE IOHEXOL 350 MG/ML SOLN COMPARISON:  Noncontrast head CT performed earlier the same day 06/09/2020. FINDINGS: CTA NECK FINDINGS Aortic arch: Atherosclerotic plaque within the visualized aortic arch and proximal major branch vessels of the neck. No hemodynamically significant innominate or proximal subclavian artery stenosis. Right carotid system: CCA and ICA patent within the neck without hemodynamically significant stenosis. Minimal atherosclerotic plaque within the carotid system within the neck.  Tortuous cervical ICA. Additionally, the mid cervical ICA has a subtly beaded appearance. Left carotid system: CCA and ICA patent within the neck without hemodynamically significant stenosis. Minimal atherosclerotic plaque within the carotid system within the neck. Tortuosity of the proximal cervical ICA. Vertebral arteries: Codominant and patent within the neck without stenosis. Skeleton: Cervical spondylosis with advanced multilevel disc space narrowing, disc bulges, posterior disc osteophytes, uncovertebral hypertrophy and facet arthrosis. C3-C4 grade 1 anterolisthesis. Other neck: No neck mass or cervical lymphadenopathy. Upper chest: No consolidation within the imaged lung apices. Centrilobular and paraseptal emphysema. Partially imaged patulous and debris-filled esophagus. Review of the MIP images confirms the above findings CTA HEAD FINDINGS Anterior circulation: The intracranial internal carotid arteries are patent. Nonstenotic calcified plaque within both vessels. The M1 middle cerebral arteries are patent. No M2 proximal branch occlusion or high-grade proximal stenosis is identified. The anterior cerebral arteries are patent. 1-2 mm inferiorly projecting vascular protrusions arising from the supraclinoid internal carotid arteries bilaterally which may reflect aneurysms or infundibula (series 9, image 93) (series 9, image 115). 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 Posterior circulation: The intracranial vertebral arteries are patent. The basilar artery is patent. The right posterior cerebral artery is occluded at the level of the P1 segment (series 10, image 22). The left posterior cerebral artery is patent. Moderate stenosis within the P1 left PCA (series 10, image 22). Moderate/severe stenosis within a left PCA branch vessel at the P2/P3 junction (series 12, image 25). Posterior communicating arteries are hypoplastic or absent  bilaterally. Apparent high-grade stenosis within the proximal left superior cerebellar artery. Venous sinuses: Within the limitations of contrast timing, no convincing thrombus. Hypoplastic left transverse dural  venous sinus. Anatomic variants: As described Review of the MIP images confirms the above findings Emergent results were called by telephone at the time of interpretation on 06/09/2020 at 3:53 pm to provider [facility] , who verbally acknowledged these results. IMPRESSION: CTA neck: 1. The common carotid, internal carotid and vertebral arteries are patent within the neck without hemodynamically significant stenosis. Minimal atherosclerotic plaque within the carotid systems within the neck. 2. The mid cervical right ICA has a subtly beaded appearance, nonspecific but potentially reflecting sequela of fibromuscular dysplasia. 3.  Aortic Atherosclerosis (ICD10-I70.0). 4.  Emphysema (ICD10-J43.9). CTA head: 1. The right posterior cerebral artery is occluded at the level of the P1 segment. 2. Moderate stenosis within the P1 left PCA. Additional moderate/severe stenosis within the left PCA at the P2/P3 junction. 3. High-grade stenosis within the proximal left superior cerebellar artery. 4. No anterior circulation intracranial large vessel occlusion identified. 5. 1-2 mm vascular protrusions arising from the supraclinoid internal carotid arteries bilaterally, which may reflect aneurysms or infundibula. 6. Ectasia of the left MCA trifurcation without discrete aneurysm at this time. Electronically Signed   By: Kellie Simmering DO   On: 06/09/2020 15:56   CT ANGIO NECK CODE STROKE  Result Date: 06/09/2020 CLINICAL DATA:  Neuro deficit, acute, stroke suspected. Additional history provided: Last known normal 1200, right facial droop, bilateral weakness and difficulty speaking. EXAM: CT ANGIOGRAPHY HEAD AND NECK TECHNIQUE: Multidetector CT imaging of the head and neck was performed using the standard protocol  during bolus administration of intravenous contrast. Multiplanar CT image reconstructions and MIPs were obtained to evaluate the vascular anatomy. Carotid stenosis measurements (when applicable) are obtained utilizing NASCET criteria, using the distal internal carotid diameter as the denominator. CONTRAST:  20mL OMNIPAQUE IOHEXOL 350 MG/ML SOLN COMPARISON:  Noncontrast head CT performed earlier the same day 06/09/2020. FINDINGS: CTA NECK FINDINGS Aortic arch: Atherosclerotic plaque within the visualized aortic arch and proximal major branch vessels of the neck. No hemodynamically significant innominate or proximal subclavian artery stenosis. Right carotid system: CCA and ICA patent within the neck without hemodynamically significant stenosis. Minimal atherosclerotic plaque within the carotid system within the neck. Tortuous cervical ICA. Additionally, the mid cervical ICA has a subtly beaded appearance. Left carotid system: CCA and ICA patent within the neck without hemodynamically significant stenosis. Minimal atherosclerotic plaque within the carotid system within the neck. Tortuosity of the proximal cervical ICA. Vertebral arteries: Codominant and patent within the neck without stenosis. Skeleton: Cervical spondylosis with advanced multilevel disc space narrowing, disc bulges, posterior disc osteophytes, uncovertebral hypertrophy and facet arthrosis. C3-C4 grade 1 anterolisthesis. Other neck: No neck mass or cervical lymphadenopathy. Upper chest: No consolidation within the imaged lung apices. Centrilobular and paraseptal emphysema. Partially imaged patulous and debris-filled esophagus. Review of the MIP images confirms the above findings CTA HEAD FINDINGS Anterior circulation: The intracranial internal carotid arteries are patent. Nonstenotic calcified plaque within both vessels. The M1 middle cerebral arteries are patent. No M2 proximal branch occlusion or high-grade proximal stenosis is identified. The  anterior cerebral arteries are patent. 1-2 mm inferiorly projecting vascular protrusions arising from the supraclinoid internal carotid arteries bilaterally which may reflect aneurysms or infundibula (series 9, image 93) (series 9, image 115). 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 Posterior circulation: The intracranial vertebral arteries are patent. The basilar artery is patent. The right posterior cerebral artery is occluded at the level of the P1 segment (series 10, image  22). The left posterior cerebral artery is patent. Moderate stenosis within the P1 left PCA (series 10, image 22). Moderate/severe stenosis within a left PCA branch vessel at the P2/P3 junction (series 12, image 25). Posterior communicating arteries are hypoplastic or absent bilaterally. Apparent high-grade stenosis within the proximal left superior cerebellar artery. Venous sinuses: Within the limitations of contrast timing, no convincing thrombus. Hypoplastic left transverse dural venous sinus. Anatomic variants: As described Review of the MIP images confirms the above findings Emergent results were called by telephone at the time of interpretation on 06/09/2020 at 3:53 pm to provider [facility] , who verbally acknowledged these results. IMPRESSION: CTA neck: 1. The common carotid, internal carotid and vertebral arteries are patent within the neck without hemodynamically significant stenosis. Minimal atherosclerotic plaque within the carotid systems within the neck. 2. The mid cervical right ICA has a subtly beaded appearance, nonspecific but potentially reflecting sequela of fibromuscular dysplasia. 3.  Aortic Atherosclerosis (ICD10-I70.0). 4.  Emphysema (ICD10-J43.9). CTA head: 1. The right posterior cerebral artery is occluded at the level of the P1 segment. 2. Moderate stenosis within the P1 left PCA. Additional moderate/severe stenosis within the left PCA  at the P2/P3 junction. 3. High-grade stenosis within the proximal left superior cerebellar artery. 4. No anterior circulation intracranial large vessel occlusion identified. 5. 1-2 mm vascular protrusions arising from the supraclinoid internal carotid arteries bilaterally, which may reflect aneurysms or infundibula. 6. Ectasia of the left MCA trifurcation without discrete aneurysm at this time. Electronically Signed   By: Kellie Simmering DO   On: 06/09/2020 15:56    Procedures .Critical Care Performed by: Lorelle Gibbs, DO Authorized by: Lorelle Gibbs, DO   Critical care provider statement:    Critical care time (minutes):  45   Critical care was time spent personally by me on the following activities:  Discussions with consultants, evaluation of patient's response to treatment, examination of patient, ordering and performing treatments and interventions, ordering and review of laboratory studies, ordering and review of radiographic studies, pulse oximetry, re-evaluation of patient's condition, obtaining history from patient or surrogate and review of old charts   I assumed direction of critical care for this patient from another provider in my specialty: no       Medications Ordered in ED Medications  sodium chloride flush (NS) 0.9 % injection 3 mL (3 mLs Intravenous Not Given 06/09/20 1513)  iohexol (OMNIPAQUE) 350 MG/ML injection 75 mL (75 mLs Intravenous Contrast Given 06/09/20 1502)    ED Course  I have reviewed the triage vital signs and the nursing notes.  Pertinent labs & imaging results that were available during my care of the patient were reviewed by me and considered in my medical decision making (see chart for details).    MDM Rules/Calculators/A&P                          87 year old female presents the emergency department as a stroke page for confusion/change in speech and possible facial droop.  On arrival she was given an NIH of 1 for change in speech.  CT of the  head did not show any acute finding.  On my evaluation after the patient has reached her room she appears to be baseline, son at bedside confirms that she has baseline.  She is alert and oriented, follows commands, currently complaining that she is cold and asking for a blanket.  I was called by radiology about an occluded right  PCA on the CTA scanning with other stenosis and lesions.  This was relayed to the neurology team.  They recommend admission for TIA work-up, MRI and other orders have been placed.  She is not an endovascular candidate at this time.  Patient admitted to hospitalist.  Patients evaluation and results requires admission for further treatment and care. Patient agrees with admission plan, offers no new complaints and is stable/unchanged at time of admit.  Final Clinical Impression(s) / ED Diagnoses Final diagnoses:  None    Rx / DC Orders ED Discharge Orders    None       Lorelle Gibbs, DO 06/09/20 [id number]

## 2020-06-09 NOTE — ED Triage Notes (Signed)
EMS arrived to pt home at 1402 due to code stroke symptoms. Family reports LKN at 12pm. CC was right facial drooping and confusion. Blood sugar was 137. Arrived in ED at 1436.

## 2020-06-09 NOTE — ED Provider Notes (Signed)
Patient presenting as a code stroke.  Last seen normal at 1:50 PM by husband.  Called 10 minutes later.  Patient airway is intact at this time without evidence of distress.  Difficulty getting words out.   Blanchie Dessert, MD 06/09/20 [phone]

## 2020-06-09 NOTE — ED Notes (Signed)
Taken to MRI by transporter.

## 2020-06-10 ENCOUNTER — Observation Stay (HOSPITAL_COMMUNITY): Payer: Medicare PPO

## 2020-06-10 ENCOUNTER — Observation Stay (HOSPITAL_BASED_OUTPATIENT_CLINIC_OR_DEPARTMENT_OTHER): Payer: Medicare PPO

## 2020-06-10 ENCOUNTER — Other Ambulatory Visit: Payer: Self-pay

## 2020-06-10 DIAGNOSIS — E785 Hyperlipidemia, unspecified: Secondary | ICD-10-CM | POA: Diagnosis not present

## 2020-06-10 DIAGNOSIS — G459 Transient cerebral ischemic attack, unspecified: Secondary | ICD-10-CM

## 2020-06-10 DIAGNOSIS — R4701 Aphasia: Secondary | ICD-10-CM | POA: Diagnosis not present

## 2020-06-10 DIAGNOSIS — E1169 Type 2 diabetes mellitus with other specified complication: Secondary | ICD-10-CM | POA: Diagnosis not present

## 2020-06-10 DIAGNOSIS — F0390 Unspecified dementia without behavioral disturbance: Secondary | ICD-10-CM | POA: Diagnosis not present

## 2020-06-10 DIAGNOSIS — I10 Essential (primary) hypertension: Secondary | ICD-10-CM | POA: Diagnosis not present

## 2020-06-10 DIAGNOSIS — E871 Hypo-osmolality and hyponatremia: Secondary | ICD-10-CM | POA: Diagnosis not present

## 2020-06-10 DIAGNOSIS — Z20822 Contact with and (suspected) exposure to covid-19: Secondary | ICD-10-CM | POA: Diagnosis not present

## 2020-06-10 DIAGNOSIS — I5032 Chronic diastolic (congestive) heart failure: Secondary | ICD-10-CM | POA: Diagnosis not present

## 2020-06-10 DIAGNOSIS — Z66 Do not resuscitate: Secondary | ICD-10-CM | POA: Diagnosis not present

## 2020-06-10 DIAGNOSIS — E7800 Pure hypercholesterolemia, unspecified: Secondary | ICD-10-CM | POA: Diagnosis not present

## 2020-06-10 LAB — ECHOCARDIOGRAM COMPLETE
AR max vel: 1.25 cm2
AV Area VTI: 1.35 cm2
AV Area mean vel: 1.39 cm2
AV Mean grad: 3 mmHg
AV Peak grad: 6.5 mmHg
Ao pk vel: 1.27 m/s
Area-P 1/2: 3.6 cm2
Height: 62 in
S' Lateral: 1.4 cm
Single Plane A4C EF: 80.7 %
Weight: 1896 oz

## 2020-06-10 LAB — TSH: TSH: 1.024 u[IU]/mL (ref 0.350–4.500)

## 2020-06-10 LAB — LIPID PANEL
Cholesterol: 181 mg/dL (ref 0–200)
HDL: 52 mg/dL (ref >40)
LDL Cholesterol: 118 mg/dL — ABNORMAL HIGH (ref 0–99)
Total CHOL/HDL Ratio: 3.5 RATIO
Triglycerides: 57 mg/dL (ref <150)
VLDL: 11 mg/dL (ref 0–40)

## 2020-06-10 LAB — HEMOGLOBIN A1C
Hgb A1c MFr Bld: 6.1 % — ABNORMAL HIGH (ref 4.8–5.6)
Mean Plasma Glucose: 128.37 mg/dL

## 2020-06-10 MED ORDER — ASPIRIN 325 MG PO TABS
325.0000 mg | ORAL_TABLET | Freq: Every day | ORAL | Status: DC
  Administered 2020-06-10 – 2020-06-11 (×2): 325 mg via ORAL
  Filled 2020-06-10 (×2): qty 1

## 2020-06-10 MED ORDER — ATORVASTATIN CALCIUM 10 MG PO TABS
20.0000 mg | ORAL_TABLET | Freq: Every day | ORAL | Status: DC
  Administered 2020-06-10 – 2020-06-11 (×2): 20 mg via ORAL
  Filled 2020-06-10 (×2): qty 2

## 2020-06-10 NOTE — Progress Notes (Signed)
PROGRESS NOTE    Notnamed Croucher Grantz  QHU:[id number] DOB: 12/03/1923 DOA: 06/09/2020 PCP: [name], MD   Chief Complaint  Patient presents with  . Code Stroke    Brief Narrative:   Kerin B Jaquith is a 87 y.o. female with medical history significant for dementia, meningioma, hypertension, diastolic heart failure, type 2 diabetes, iron deficiency anemia and CKD stage III who presents with concerns of moaning and fixed gaze.  CT head negative for any acute findings.  CTA head and neck shows right posterior cerebral artery occlusion and other moderate-high grade stenosis of the left PCA.  No TPA was given due to suspicion this was more likely a TIA.   Neurology has evaluated patient at bedside and recommended TIA and seizure work-up with hospitalist admission.  Assessment & Plan:   Principal Problem:   TIA (transient ischemic attack) Active Problems:   Hypertension   Hypercholesterolemia   Chronic diastolic CHF (congestive heart failure) (HCC)   Type 2 diabetes mellitus with hyperlipidemia (HCC)   Dementia without behavioral disturbance (HCC)    Aphasia// Fixed gaze Differential include TIA vs SEIZURES. EEG is negative for seizures.  MRI could not be completed. Partial scan does not show any acute stroke.  Neurology recommended aspirin and plavix for 3 weeks, followed by plavix alone.  continue with lipitor 20 mg daily.  LDL is 118, Hemoglobin A1c is 6.1. echocardiogram pending.     Hypertension;  Well controlled.    Hyperlipidemia:  Resume lipitor 20 mg daily.    Chronic diastolic heart failure;  She appears compensated.    Dementia:  Not agitated.     Type 2 DM Diet controlled.   Hyponatremia:  Possibly from dehydration.  Repeat sodium in am.     DVT prophylaxis: (Lovenox/) Code Status: (DNR) Family Communication: (Spray PHONE.  Disposition:   Status is: Observation  The patient will require care spanning > 2 midnights and  should be moved to inpatient because: Ongoing diagnostic testing needed not appropriate for outpatient work up  Dispo: The patient is from: Home              Anticipated d/c is to: Home              Patient currently is not medically stable to d/c.   Difficult to place patient No       Consultants:   neurology  Procedures:echocardiogram.   Antimicrobials: none.    Subjective: No new complaints.   Objective: Vitals:   06/09/20 2330 06/10/20 0000 06/10/20 0030 06/10/20 1401  BP: (!) 156/77 (!) 150/62 (!) 184/80 135/64  Pulse: 69 70 65 66  Resp: (!) 22 (!) 23 16 18   Temp:    97.8 F (36.6 C)  TempSrc:      SpO2: 95% 98% 98% 96%  Weight:      Height:       No intake or output data in the 24 hours ending 06/10/20 1453 Filed Weights   06/09/20 1400 06/09/20 1455  Weight: 53.8 kg 53.8 kg    Examination:  General exam: Appears calm and comfortable  Respiratory system: Clear to auscultation. Respiratory effort normal. Cardiovascular system: S1 & S2 heard, RRR. No JVD,  No pedal edema. Gastrointestinal system: Abdomen is nondistended, soft and nontender.Normal bowel sounds heard. Central nervous system: Alert , confused, but able to answer simple questions.  Extremities: no pedal edema. Skin: No rashes, lesions or ulcers Psychiatry: Mood & affect appropriate.  Data Reviewed: I have personally reviewed following labs and imaging studies  CBC: Recent Labs  Lab 06/09/20 1437 06/09/20 1445  WBC 6.8  --   NEUTROABS 4.5  --   HGB 13.3 13.6  HCT 39.6 40.0  MCV 95.4  --   PLT 266  --     Basic Metabolic Panel: Recent Labs  Lab 06/09/20 1437 06/09/20 1445  NA 129* 131*  K 4.0 3.9  CL 98 97*  CO2 25  --   GLUCOSE 137* 134*  BUN 11 12  CREATININE 1.03* 1.00  CALCIUM 9.1  --     GFR: Estimated Creatinine Clearance: 27.2 mL/min (by C-G formula based on SCr of 1 mg/dL).  Liver Function Tests: Recent Labs  Lab 06/09/20 1437  AST 18  ALT 12   ALKPHOS 69  BILITOT 0.6  PROT 6.6  ALBUMIN 3.5    CBG: Recent Labs  Lab 06/09/20 1439  GLUCAP 138*     Recent Results (from the past 240 hour(s))  SARS CORONAVIRUS 2 (TAT 6-24 HRS) Nasopharyngeal Nasopharyngeal Swab     Status: None   Collection Time: 06/09/20  3:39 PM   Specimen: Nasopharyngeal Swab  Result Value Ref Range Status   SARS Coronavirus 2 NEGATIVE NEGATIVE Final    Comment: (NOTE) SARS-CoV-2 target nucleic acids are NOT DETECTED.  The SARS-CoV-2 RNA is generally detectable in upper and lower respiratory specimens during the acute phase of infection. Negative results do not preclude SARS-CoV-2 infection, do not rule out co-infections with other pathogens, and should not be used as the sole basis for treatment or other patient management decisions. Negative results must be combined with clinical observations, patient history, and epidemiological information. The expected result is Negative.  Fact Sheet for Patients: SugarRoll.be  Fact Sheet for Healthcare Providers: https://www.woods-mathews.com/  This test is not yet approved or cleared by the [place] FDA and  has been authorized for detection and/or diagnosis of SARS-CoV-2 by FDA under an Emergency Use Authorization (EUA). This EUA will remain  in effect (meaning this test can be used) for the duration of the COVID-19 declaration under Se ction 564(b)(1) of the Act, 21 U.S.C. section 360bbb-3(b)(1), unless the authorization is terminated or revoked sooner.  Performed at [facility], [place] [address] [place], [place] [address]          Radiology Studies: MR BRAIN WO CONTRAST  Result Date: 06/09/2020 CLINICAL DATA:  TIA.  Aphasia. EXAM: MRI HEAD WITHOUT CONTRAST TECHNIQUE: Multiplanar, multiecho pulse sequences of the brain and surrounding structures were obtained without intravenous contrast. COMPARISON:  MRI head 03/07/2020.  CT head  06/09/2020 FINDINGS: Brain: Limited study.  Patient was not able to complete the study. Negative for acute infarct. Moderate to advanced cerebral atrophy. No hydrocephalus. Calcified mass along the anterior falx compatible with meningioma measuring 13 mm. Vascular: Normal arterial flow voids. Skull and upper cervical spine: Negative Sinuses/Orbits: Paranasal sinuses clear. Bilateral cataract extraction Other: None IMPRESSION: Limited study.  Patient not able to complete the study Negative for acute infarct Extensive atrophy.  13 mm interhemispheric meningioma unchanged. Electronically Signed   By: Franchot Gallo M.D.   On: 06/09/2020 19:31   EEG adult  Result Date: 06/10/2020 Raenette Rover, MD     06/10/2020  1:57 PM EEG Report Indication: possible seizure activity--episodes of unresponsiveness This study was recorded in the waking/drowsy state.  The duration of the study was 38 minutes.  Electrodes were placed according to the International 10/20 system.  Video was available for clinical correlation as needed. In the waking state, fairly clear background organization is seen with a distinct anterior - posterior voltage and frequency gradient and a symmetric and reactive posterior dominant rhythm of approximately 8-9 hertz.  Anteriorly, is the expected pattern of faster frequency, lower voltage waveforms. During the drowsy state, there is attenuation of the gradient, a general shift to slower frequencies diffusely, a waxing and waning of the posterior dominant rhythm, and slow roving eye movements. There was paroxysmal high amplitude fairly diffuse delta/theta slowing--this was polymorphic, and variable in frequency (roughly 2-5 Hz)--this was seen only during drowsiness, and favored to represent a benign variant vs the intrusion of some sleep architecture (a finding seen in dementia).  On video the patient has her eyes closed and appears to be sleeping. Hyperventilation: Deferred Photic stimulation:  Unremarkable There are no clear focal or epileptiform abnormalities  or interhemispheric asymmetries. Impression: This is an essentially normal waking/drowsy/possibly very brief sleep study--some paroxysmal slowing is seen during drowsiness/sleep, although this is favored to represent either poorly formed sleep architecture vs benign/drowsy variant--at worst it could suggest a mild encephalopathy.  There are no clear focal or epileptiform abnormalities.   CT HEAD CODE STROKE WO CONTRAST  Result Date: 06/09/2020 CLINICAL DATA:  Code stroke.  Right facial droop, aphasia EXAM: CT HEAD WITHOUT CONTRAST TECHNIQUE: Contiguous axial images were obtained from the base of the skull through the vertex without intravenous contrast. COMPARISON:  03/27/2020 FINDINGS: Motion artifact is present. Brain: No acute intracranial hemorrhage, mass effect, or edema. No definite loss of gray-white differentiation. Prominence of the ventricles and sulci reflects generalized parenchymal volume loss. Patchy and confluent areas of hypoattenuation in the supratentorial white matter are nonspecific but probably reflect chronic microvascular ischemic changes. There is a small partially calcified mass along the anterior falx consistent with a meningioma. These findings are unchanged from the recent prior study. Vascular: No hyperdense vessel. There is intracranial atherosclerotic calcification at the skull base. Skull: Unremarkable. Sinuses/Orbits: No acute abnormality Other: Mastoid air cells are clear. ASPECTS [facility] Stroke Program Early CT Score) - Ganglionic level infarction (caudate, lentiform nuclei, internal capsule, insula, M1-M3 cortex): 7 - Supraganglionic infarction (M4-M6 cortex): 3 Total score (0-10 with 10 being normal): 10 IMPRESSION: Motion artifact is present. No acute intracranial hemorrhage or evidence of acute infarction. These results were communicated to Dr. Cheral Marker at 2:56 pm on 06/09/2020 by text page via the [facility]  messaging system. Electronically Signed   By: Macy Mis M.D.   On: 06/09/2020 15:07   CT ANGIO HEAD CODE STROKE  Result Date: 06/09/2020 CLINICAL DATA:  Neuro deficit, acute, stroke suspected. Additional history provided: Last known normal 1200, right facial droop, bilateral weakness and difficulty speaking. EXAM: CT ANGIOGRAPHY HEAD AND NECK TECHNIQUE: Multidetector CT imaging of the head and neck was performed using the standard protocol during bolus administration of intravenous contrast. Multiplanar CT image reconstructions and MIPs were obtained to evaluate the vascular anatomy. Carotid stenosis measurements (when applicable) are obtained utilizing NASCET criteria, using the distal internal carotid diameter as the denominator. CONTRAST:  74mL OMNIPAQUE IOHEXOL 350 MG/ML SOLN COMPARISON:  Noncontrast head CT performed earlier the same day 06/09/2020. FINDINGS: CTA NECK FINDINGS Aortic arch: Atherosclerotic plaque within the visualized aortic arch and proximal major branch vessels of the neck. No hemodynamically significant innominate or proximal subclavian artery stenosis. Right carotid system: CCA and ICA patent within the neck without hemodynamically significant stenosis. Minimal atherosclerotic plaque within the carotid system within the  neck. Tortuous cervical ICA. Additionally, the mid cervical ICA has a subtly beaded appearance. Left carotid system: CCA and ICA patent within the neck without hemodynamically significant stenosis. Minimal atherosclerotic plaque within the carotid system within the neck. Tortuosity of the proximal cervical ICA. Vertebral arteries: Codominant and patent within the neck without stenosis. Skeleton: Cervical spondylosis with advanced multilevel disc space narrowing, disc bulges, posterior disc osteophytes, uncovertebral hypertrophy and facet arthrosis. C3-C4 grade 1 anterolisthesis. Other neck: No neck mass or cervical lymphadenopathy. Upper chest: No consolidation  within the imaged lung apices. Centrilobular and paraseptal emphysema. Partially imaged patulous and debris-filled esophagus. Review of the MIP images confirms the above findings CTA HEAD FINDINGS Anterior circulation: The intracranial internal carotid arteries are patent. Nonstenotic calcified plaque within both vessels. The M1 middle cerebral arteries are patent. No M2 proximal branch occlusion or high-grade proximal stenosis is identified. The anterior cerebral arteries are patent. 1-2 mm inferiorly projecting vascular protrusions arising from the supraclinoid internal carotid arteries bilaterally which may reflect aneurysms or infundibula (series 9, image 93) (series 9, image 115). 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 Posterior circulation: The intracranial vertebral arteries are patent. The basilar artery is patent. The right posterior cerebral artery is occluded at the level of the P1 segment (series 10, image 22). The left posterior cerebral artery is patent. Moderate stenosis within the P1 left PCA (series 10, image 22). Moderate/severe stenosis within a left PCA branch vessel at the P2/P3 junction (series 12, image 25). Posterior communicating arteries are hypoplastic or absent bilaterally. Apparent high-grade stenosis within the proximal left superior cerebellar artery. Venous sinuses: Within the limitations of contrast timing, no convincing thrombus. Hypoplastic left transverse dural venous sinus. Anatomic variants: As described Review of the MIP images confirms the above findings Emergent results were called by telephone at the time of interpretation on 06/09/2020 at 3:53 pm to provider [facility] , who verbally acknowledged these results. IMPRESSION: CTA neck: 1. The common carotid, internal carotid and vertebral arteries are patent within the neck without hemodynamically significant stenosis. Minimal atherosclerotic  plaque within the carotid systems within the neck. 2. The mid cervical right ICA has a subtly beaded appearance, nonspecific but potentially reflecting sequela of fibromuscular dysplasia. 3.  Aortic Atherosclerosis (ICD10-I70.0). 4.  Emphysema (ICD10-J43.9). CTA head: 1. The right posterior cerebral artery is occluded at the level of the P1 segment. 2. Moderate stenosis within the P1 left PCA. Additional moderate/severe stenosis within the left PCA at the P2/P3 junction. 3. High-grade stenosis within the proximal left superior cerebellar artery. 4. No anterior circulation intracranial large vessel occlusion identified. 5. 1-2 mm vascular protrusions arising from the supraclinoid internal carotid arteries bilaterally, which may reflect aneurysms or infundibula. 6. Ectasia of the left MCA trifurcation without discrete aneurysm at this time. Electronically Signed   By: Kellie Simmering DO   On: 06/09/2020 15:56   CT ANGIO NECK CODE STROKE  Result Date: 06/09/2020 CLINICAL DATA:  Neuro deficit, acute, stroke suspected. Additional history provided: Last known normal 1200, right facial droop, bilateral weakness and difficulty speaking. EXAM: CT ANGIOGRAPHY HEAD AND NECK TECHNIQUE: Multidetector CT imaging of the head and neck was performed using the standard protocol during bolus administration of intravenous contrast. Multiplanar CT image reconstructions and MIPs were obtained to evaluate the vascular anatomy. Carotid stenosis measurements (when applicable) are obtained utilizing NASCET criteria, using the distal internal carotid diameter as the denominator. CONTRAST:  90mL OMNIPAQUE  IOHEXOL 350 MG/ML SOLN COMPARISON:  Noncontrast head CT performed earlier the same day 06/09/2020. FINDINGS: CTA NECK FINDINGS Aortic arch: Atherosclerotic plaque within the visualized aortic arch and proximal major branch vessels of the neck. No hemodynamically significant innominate or proximal subclavian artery stenosis. Right carotid  system: CCA and ICA patent within the neck without hemodynamically significant stenosis. Minimal atherosclerotic plaque within the carotid system within the neck. Tortuous cervical ICA. Additionally, the mid cervical ICA has a subtly beaded appearance. Left carotid system: CCA and ICA patent within the neck without hemodynamically significant stenosis. Minimal atherosclerotic plaque within the carotid system within the neck. Tortuosity of the proximal cervical ICA. Vertebral arteries: Codominant and patent within the neck without stenosis. Skeleton: Cervical spondylosis with advanced multilevel disc space narrowing, disc bulges, posterior disc osteophytes, uncovertebral hypertrophy and facet arthrosis. C3-C4 grade 1 anterolisthesis. Other neck: No neck mass or cervical lymphadenopathy. Upper chest: No consolidation within the imaged lung apices. Centrilobular and paraseptal emphysema. Partially imaged patulous and debris-filled esophagus. Review of the MIP images confirms the above findings CTA HEAD FINDINGS Anterior circulation: The intracranial internal carotid arteries are patent. Nonstenotic calcified plaque within both vessels. The M1 middle cerebral arteries are patent. No M2 proximal branch occlusion or high-grade proximal stenosis is identified. The anterior cerebral arteries are patent. 1-2 mm inferiorly projecting vascular protrusions arising from the supraclinoid internal carotid arteries bilaterally which may reflect aneurysms or infundibula (series 9, image 93) (series 9, image 115). 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 Posterior circulation: The intracranial vertebral arteries are patent. The basilar artery is patent. The right posterior cerebral artery is occluded at the level of the P1 segment (series 10, image 22). The left posterior cerebral artery is patent. Moderate stenosis within the P1 left PCA (series 10, image 22).  Moderate/severe stenosis within a left PCA branch vessel at the P2/P3 junction (series 12, image 25). Posterior communicating arteries are hypoplastic or absent bilaterally. Apparent high-grade stenosis within the proximal left superior cerebellar artery. Venous sinuses: Within the limitations of contrast timing, no convincing thrombus. Hypoplastic left transverse dural venous sinus. Anatomic variants: As described Review of the MIP images confirms the above findings Emergent results were called by telephone at the time of interpretation on 06/09/2020 at 3:53 pm to provider [facility] [place] , who verbally acknowledged these results. IMPRESSION: CTA neck: 1. The common carotid, internal carotid and vertebral arteries are patent within the neck without hemodynamically significant stenosis. Minimal atherosclerotic plaque within the carotid systems within the neck. 2. The mid cervical right ICA has a subtly beaded appearance, nonspecific but potentially reflecting sequela of fibromuscular dysplasia. 3.  Aortic Atherosclerosis (ICD10-I70.0). 4.  Emphysema (ICD10-J43.9). CTA head: 1. The right posterior cerebral artery is occluded at the level of the P1 segment. 2. Moderate stenosis within the P1 left PCA. Additional moderate/severe stenosis within the left PCA at the P2/P3 junction. 3. High-grade stenosis within the proximal left superior cerebellar artery. 4. No anterior circulation intracranial large vessel occlusion identified. 5. 1-2 mm vascular protrusions arising from the supraclinoid internal carotid arteries bilaterally, which may reflect aneurysms or infundibula. 6. Ectasia of the left MCA trifurcation without discrete aneurysm at this time. Electronically Signed   By: Kellie Simmering DO   On: 06/09/2020 15:56        Scheduled Meds: . aspirin EC  81 mg Oral Daily  . atorvastatin  20 mg Oral Daily  . cholecalciferol  1,000 Units Oral Daily  . clopidogrel  75 mg Oral Daily  . enoxaparin (LOVENOX)  injection  40 mg Subcutaneous Q24H  . montelukast  10 mg Oral QHS  . pantoprazole  40 mg Oral QAC breakfast  . potassium chloride  10 mEq Oral Daily  . sodium chloride flush  3 mL Intravenous Once   Continuous Infusions:   LOS: 0 days        Hosie Poisson, MD [facility]   To contact the attending provider between 7A-7P or the covering provider during after hours 7P-7A, please log into the web site [email or web address] and access using universal [place] password for that web site. If you do not have the password, please call the hospital operator.  06/10/2020, 2:53 PM

## 2020-06-10 NOTE — Progress Notes (Signed)
EEG completed, results pending. 

## 2020-06-10 NOTE — Progress Notes (Addendum)
STROKE TEAM PROGRESS NOTE   INTERVAL HISTORY No acute events overnight.   Patient reports she is feeling fine today. Does not seem to reliably recall reason for being here or yesterday's events. Denies headache, n/v, dizziness or any other concern today.   Her son is at the bedside. He reports cognitive decline, worse past few years. Her baseline is knowing routine caregivers and being conversant but forgetful. Usually unaware of date and details. From his perspective patient is back to her baseline today.   We discussed work up and plan of care. Questions were answered.   Vitals:   06/09/20 2300 06/09/20 2330 06/10/20 0000 06/10/20 0030  BP: (!) 174/78 (!) 156/77 (!) 150/62 (!) 184/80  Pulse: 81 69 70 65  Resp: 20 (!) 22 (!) 23 16  Temp:      TempSrc:      SpO2: 100% 95% 98% 98%  Weight:      Height:       CBC:  Recent Labs  Lab 06/09/20 1437 06/09/20 1445  WBC 6.8  --   NEUTROABS 4.5  --   HGB 13.3 13.6  HCT 39.6 40.0  MCV 95.4  --   PLT 266  --    Basic Metabolic Panel:  Recent Labs  Lab 06/09/20 1437 06/09/20 1445  NA 129* 131*  K 4.0 3.9  CL 98 97*  CO2 25  --   GLUCOSE 137* 134*  BUN 11 12  CREATININE 1.03* 1.00  CALCIUM 9.1  --    Lipid Panel:  Recent Labs  Lab 06/10/20 0221  CHOL 181  TRIG 57  HDL 52  CHOLHDL 3.5  VLDL 11  LDLCALC 118*   HgbA1c:  Recent Labs  Lab 06/10/20 0221  HGBA1C 6.1*   Urine Drug Screen: No results for input(s): LABOPIA, COCAINSCRNUR, LABBENZ, AMPHETMU, THCU, LABBARB in the last 168 hours.  Alcohol Level No results for input(s): ETH in the last 168 hours.  IMAGING past 24 hours MR BRAIN WO CONTRAST  Result Date: 06/09/2020 CLINICAL DATA:  TIA.  Aphasia. EXAM: MRI HEAD WITHOUT CONTRAST TECHNIQUE: Multiplanar, multiecho pulse sequences of the brain and surrounding structures were obtained without intravenous contrast. COMPARISON:  MRI head 03/07/2020.  CT head 06/09/2020 FINDINGS: Brain: Limited study.  Patient was  not able to complete the study. Negative for acute infarct. Moderate to advanced cerebral atrophy. No hydrocephalus. Calcified mass along the anterior falx compatible with meningioma measuring 13 mm. Vascular: Normal arterial flow voids. Skull and upper cervical spine: Negative Sinuses/Orbits: Paranasal sinuses clear. Bilateral cataract extraction Other: None IMPRESSION: Limited study.  Patient not able to complete the study Negative for acute infarct Extensive atrophy.  13 mm interhemispheric meningioma unchanged. Electronically Signed   By: Franchot Gallo M.D.   On: 06/09/2020 19:31   CT HEAD CODE STROKE WO CONTRAST  Result Date: 06/09/2020 CLINICAL DATA:  Code stroke.  Right facial droop, aphasia EXAM: CT HEAD WITHOUT CONTRAST TECHNIQUE: Contiguous axial images were obtained from the base of the skull through the vertex without intravenous contrast. COMPARISON:  03/27/2020 FINDINGS: Motion artifact is present. Brain: No acute intracranial hemorrhage, mass effect, or edema. No definite loss of gray-white differentiation. Prominence of the ventricles and sulci reflects generalized parenchymal volume loss. Patchy and confluent areas of hypoattenuation in the supratentorial white matter are nonspecific but probably reflect chronic microvascular ischemic changes. There is a small partially calcified mass along the anterior falx consistent with a meningioma. These findings are unchanged from the recent prior  study. Vascular: No hyperdense vessel. There is intracranial atherosclerotic calcification at the skull base. Skull: Unremarkable. Sinuses/Orbits: No acute abnormality Other: Mastoid air cells are clear. ASPECTS [facility] Stroke Program Early CT Score) - Ganglionic level infarction (caudate, lentiform nuclei, internal capsule, insula, M1-M3 cortex): 7 - Supraganglionic infarction (M4-M6 cortex): 3 Total score (0-10 with 10 being normal): 10 IMPRESSION: Motion artifact is present. No acute intracranial hemorrhage  or evidence of acute infarction. These results were communicated to Dr. Cheral Marker at 2:56 pm on 06/09/2020 by text page via the [facility] messaging system. Electronically Signed   By: Macy Mis M.D.   On: 06/09/2020 15:07   CT ANGIO HEAD CODE STROKE  Result Date: 06/09/2020 CLINICAL DATA:  Neuro deficit, acute, stroke suspected. Additional history provided: Last known normal 1200, right facial droop, bilateral weakness and difficulty speaking. EXAM: CT ANGIOGRAPHY HEAD AND NECK TECHNIQUE: Multidetector CT imaging of the head and neck was performed using the standard protocol during bolus administration of intravenous contrast. Multiplanar CT image reconstructions and MIPs were obtained to evaluate the vascular anatomy. Carotid stenosis measurements (when applicable) are obtained utilizing NASCET criteria, using the distal internal carotid diameter as the denominator. CONTRAST:  62mL OMNIPAQUE IOHEXOL 350 MG/ML SOLN COMPARISON:  Noncontrast head CT performed earlier the same day 06/09/2020. FINDINGS: CTA NECK FINDINGS Aortic arch: Atherosclerotic plaque within the visualized aortic arch and proximal major branch vessels of the neck. No hemodynamically significant innominate or proximal subclavian artery stenosis. Right carotid system: CCA and ICA patent within the neck without hemodynamically significant stenosis. Minimal atherosclerotic plaque within the carotid system within the neck. Tortuous cervical ICA. Additionally, the mid cervical ICA has a subtly beaded appearance. Left carotid system: CCA and ICA patent within the neck without hemodynamically significant stenosis. Minimal atherosclerotic plaque within the carotid system within the neck. Tortuosity of the proximal cervical ICA. Vertebral arteries: Codominant and patent within the neck without stenosis. Skeleton: Cervical spondylosis with advanced multilevel disc space narrowing, disc bulges, posterior disc osteophytes, uncovertebral hypertrophy and facet  arthrosis. C3-C4 grade 1 anterolisthesis. Other neck: No neck mass or cervical lymphadenopathy. Upper chest: No consolidation within the imaged lung apices. Centrilobular and paraseptal emphysema. Partially imaged patulous and debris-filled esophagus. Review of the MIP images confirms the above findings CTA HEAD FINDINGS Anterior circulation: The intracranial internal carotid arteries are patent. Nonstenotic calcified plaque within both vessels. The M1 middle cerebral arteries are patent. No M2 proximal branch occlusion or high-grade proximal stenosis is identified. The anterior cerebral arteries are patent. 1-2 mm inferiorly projecting vascular protrusions arising from the supraclinoid internal carotid arteries bilaterally which may reflect aneurysms or infundibula (series 9, image 93) (series 9, image 115). 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 Posterior circulation: The intracranial vertebral arteries are patent. The basilar artery is patent. The right posterior cerebral artery is occluded at the level of the P1 segment (series 10, image 22). The left posterior cerebral artery is patent. Moderate stenosis within the P1 left PCA (series 10, image 22). Moderate/severe stenosis within a left PCA branch vessel at the P2/P3 junction (series 12, image 25). Posterior communicating arteries are hypoplastic or absent bilaterally. Apparent high-grade stenosis within the proximal left superior cerebellar artery. Venous sinuses: Within the limitations of contrast timing, no convincing thrombus. Hypoplastic left transverse dural venous sinus. Anatomic variants: As described Review of the MIP images confirms the above findings Emergent results were called by telephone at the time of  interpretation on 06/09/2020 at 3:53 pm to provider [facility] , who verbally acknowledged these results. IMPRESSION: CTA neck: 1. The common carotid, internal  carotid and vertebral arteries are patent within the neck without hemodynamically significant stenosis. Minimal atherosclerotic plaque within the carotid systems within the neck. 2. The mid cervical right ICA has a subtly beaded appearance, nonspecific but potentially reflecting sequela of fibromuscular dysplasia. 3.  Aortic Atherosclerosis (ICD10-I70.0). 4.  Emphysema (ICD10-J43.9). CTA head: 1. The right posterior cerebral artery is occluded at the level of the P1 segment. 2. Moderate stenosis within the P1 left PCA. Additional moderate/severe stenosis within the left PCA at the P2/P3 junction. 3. High-grade stenosis within the proximal left superior cerebellar artery. 4. No anterior circulation intracranial large vessel occlusion identified. 5. 1-2 mm vascular protrusions arising from the supraclinoid internal carotid arteries bilaterally, which may reflect aneurysms or infundibula. 6. Ectasia of the left MCA trifurcation without discrete aneurysm at this time. Electronically Signed   By: Kellie Simmering DO   On: 06/09/2020 15:56   CT ANGIO NECK CODE STROKE  Result Date: 06/09/2020 CLINICAL DATA:  Neuro deficit, acute, stroke suspected. Additional history provided: Last known normal 1200, right facial droop, bilateral weakness and difficulty speaking. EXAM: CT ANGIOGRAPHY HEAD AND NECK TECHNIQUE: Multidetector CT imaging of the head and neck was performed using the standard protocol during bolus administration of intravenous contrast. Multiplanar CT image reconstructions and MIPs were obtained to evaluate the vascular anatomy. Carotid stenosis measurements (when applicable) are obtained utilizing NASCET criteria, using the distal internal carotid diameter as the denominator. CONTRAST:  32mL OMNIPAQUE IOHEXOL 350 MG/ML SOLN COMPARISON:  Noncontrast head CT performed earlier the same day 06/09/2020. FINDINGS: CTA NECK FINDINGS Aortic arch: Atherosclerotic plaque within the visualized aortic arch and proximal major  branch vessels of the neck. No hemodynamically significant innominate or proximal subclavian artery stenosis. Right carotid system: CCA and ICA patent within the neck without hemodynamically significant stenosis. Minimal atherosclerotic plaque within the carotid system within the neck. Tortuous cervical ICA. Additionally, the mid cervical ICA has a subtly beaded appearance. Left carotid system: CCA and ICA patent within the neck without hemodynamically significant stenosis. Minimal atherosclerotic plaque within the carotid system within the neck. Tortuosity of the proximal cervical ICA. Vertebral arteries: Codominant and patent within the neck without stenosis. Skeleton: Cervical spondylosis with advanced multilevel disc space narrowing, disc bulges, posterior disc osteophytes, uncovertebral hypertrophy and facet arthrosis. C3-C4 grade 1 anterolisthesis. Other neck: No neck mass or cervical lymphadenopathy. Upper chest: No consolidation within the imaged lung apices. Centrilobular and paraseptal emphysema. Partially imaged patulous and debris-filled esophagus. Review of the MIP images confirms the above findings CTA HEAD FINDINGS Anterior circulation: The intracranial internal carotid arteries are patent. Nonstenotic calcified plaque within both vessels. The M1 middle cerebral arteries are patent. No M2 proximal branch occlusion or high-grade proximal stenosis is identified. The anterior cerebral arteries are patent. 1-2 mm inferiorly projecting vascular protrusions arising from the supraclinoid internal carotid arteries bilaterally which may reflect aneurysms or infundibula (series 9, image 93) (series 9, image 115). 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 Posterior circulation: The intracranial vertebral arteries are patent. The basilar artery is patent. The right posterior cerebral artery is occluded at the level of the P1 segment (series  10, image 22). The left posterior cerebral artery is patent. Moderate stenosis within the P1 left PCA (series 10, image 22). Moderate/severe stenosis within a left  PCA branch vessel at the P2/P3 junction (series 12, image 25). Posterior communicating arteries are hypoplastic or absent bilaterally. Apparent high-grade stenosis within the proximal left superior cerebellar artery. Venous sinuses: Within the limitations of contrast timing, no convincing thrombus. Hypoplastic left transverse dural venous sinus. Anatomic variants: As described Review of the MIP images confirms the above findings Emergent results were called by telephone at the time of interpretation on 06/09/2020 at 3:53 pm to provider [facility] , who verbally acknowledged these results. IMPRESSION: CTA neck: 1. The common carotid, internal carotid and vertebral arteries are patent within the neck without hemodynamically significant stenosis. Minimal atherosclerotic plaque within the carotid systems within the neck. 2. The mid cervical right ICA has a subtly beaded appearance, nonspecific but potentially reflecting sequela of fibromuscular dysplasia. 3.  Aortic Atherosclerosis (ICD10-I70.0). 4.  Emphysema (ICD10-J43.9). CTA head: 1. The right posterior cerebral artery is occluded at the level of the P1 segment. 2. Moderate stenosis within the P1 left PCA. Additional moderate/severe stenosis within the left PCA at the P2/P3 junction. 3. High-grade stenosis within the proximal left superior cerebellar artery. 4. No anterior circulation intracranial large vessel occlusion identified. 5. 1-2 mm vascular protrusions arising from the supraclinoid internal carotid arteries bilaterally, which may reflect aneurysms or infundibula. 6. Ectasia of the left MCA trifurcation without discrete aneurysm at this time. Electronically Signed   By: Kellie Simmering DO   On: 06/09/2020 15:56   PHYSICAL EXAM Physical Exam  Constitutional: Appears well-developed and  well-nourished. Thin, frail appearing elderly female sitting up in chair in NAD.  Psych: Affect appropriate to situation Eyes: No scleral injection Head: Normocephalic.  Cardiovascular: Normal rate and regular rhythm.  Respiratory: Effort normal Skin: WDI and well perfused  Neuro: Mental Status: Smiling and pleasant. Patient is awake, alert, able to state she is in the hospital. Oriented to person. Speaking fluently and clearly. Able to repeat 5 word sentence without difficulty. Names 3/3 objects. Follows 2 step commands most of the time.  Cranial Nerves: II: Visual Fields are full. Pupils are equal, round, and reactive to light.  III,IV, VI: EOMI without ptosis or diplopia.  V: Facial sensation is symmetric to light touch V1-V3 VII: Face symmetric without weakness VIII: Hearing is intact to voice [facility]). X:  XI: Shoulder shrug is symmetric. XII: Tongue is midline without atrophy or fasciculations.  Motor: Tone is normal. Bulk is decreased. 5/5 strength was present in all four extremities.  No pronator drift.  Sensory: Sensation is symmetric to light touch in the arms and legs.  Reflexes: Symmetric.  Plantars: Toes are downgoing bilaterally.  Cerebellar: Intact arm rolling UE RAMS bilaterally Gait: Deferred  ASSESSMENT/PLAN 87 yo female with a PMHx of CdHF, CKD II, CAD, DM II, HLD, chronic menigioma, cognitive disorder, HB intermittently during sleep on long term cardiac monitoring 2021, OA, BPV, and HTN who presented as a Code Stroke per EMS due to aphasia at home. She also cried out and had a glazed look. No history of seizures. She lives with her son and her MRS is 3. CT and CTA showed no acute finding, no LVO. Not a candidate for tPA due to history of chronic meningioma. No IR due to no LVO. Her NIHSS was 6 on arrival and down to 0 back in ED room.   Stroke-like episode of aphasia, crying out and getting a "glazed look to her eyes" now resolved. 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  DDx includes but is not limited to possible  TIA, seizure (negative spot EEG). Less likely complicated migraine (no history of migraine, chronic headache).  CT head  Some motion artifact with no acute intracranial hemorrhage or evidence of acute infarction. ASPECTS 10.   CTA neck The common carotid, internal carotid and vertebral arteries are patent within the neck without hemodynamically significant stenosis. Minimal atherosclerotic plaque within the carotid systems within the neck. The mid cervical right ICA has a subtly beaded appearance, nonspecific but potentially reflecting sequela of fibromuscular dysplasia. Aortic Atherosclerosis (ICD10-I70.0). Emphysema (ICD10-J43.9).  CTA head: -No anterior circulation  LVO -The right posterior cerebral artery is occluded at the level of the P1 segment. -Moderate stenosis within the P1 left PCA. Additional moderate/severe stenosis within the left PCA at the P2/P3 junction. -High-grade stenosis within the proximal left superior cerebellar artery.  MRI Negative for acute infarct with limited study due to patient intolerance   2D Echo Pending  LDL 118  HgbA1c 6.1  VTE prophylaxis - Lovenox    Diet   Diet Carb Modified Fluid consistency: Thin; Room service appropriate? Yes   On ASA 81mg  PTA  ASA 325 mg and Plavix 75mg  daily x 3 weeks then ASA alone   Therapy recommendations:  Home health PT/OT  Disposition:  Home with home health PT/OT  Questionable seizure-like activity  No history of seizure  EEG was negative for seizure: This is an essentially normal waking/drowsy/possibly very brief sleep study--some paroxysmal slowing is seen during drowsiness/sleep, although this is favored to represent either poorly formed sleep architecture vs benign/drowsy variant--at worst it could suggest a mild encephalopathy.  There are no clear focal or epileptiform abnormalities.  No post ictal state reported  No AEDs  indicated at this time  Hypertension  Stable . Long-term BP goal normotensive  Hyperlipidemia  Home meds: none  LDL 118, not at goal < 70  Continue Lipitor 20mg , not high intensity lipitor due to advanced age   Continue statin at discharge  Other Stroke Risk Factors  Advanced Age >/= 51   Former Cigarette smoker: quit in her 84's  Coronary artery disease  Chronic diastolic heart failure  Other Active Problems   Hospital day # 0 This plan of care was directed by Dr. Stefanie Libel  ATTENDING NOTE: I reviewed above note and agree with the assessment and plan. Pt was seen and examined.   87 year old female with history of CHF, CKD, CAD, diabetes, hypertension hyperlipidemia, dementia and headache following with [name] admitted for episode of glazed eyes, crying out, aphasia, right facial droop and slurred speech.  Aphasia and word finding difficulty resolved on ED arrival.  Currently patient at her baseline, no facial droop or aphasia.  CT no acute finding.  CT head and neck showed likely chronic right P1 occlusion and left P1 and P2/P3 moderate stenosis as well as left SCA high-grade stenosis.  2D echo pending.  EEG no seizure.  MRI with limited sequences but no acute infarct.  LDL 118, A1c 6.1.  Creatinine 1.0, sodium 131.  She is on aspirin 81 at home.  She had 14-day cardiac event monitoring in 05/2019 with no A. Fib.  On exam, son at bedside, patient awake alert, pleasant, extremely hard of hearing, orientated to people and year of birth, however not orientated to time or place.  Fluent language, able to name common objects and repeat simple sentences.  Tracking bilaterally, no gaze palsy, blinking to visual threat bilaterally.  No facial droop, moving all extremities symmetrically.  Finger-to-nose bilaterally  intact.  Sensation not corporative but no sensory loss observed.  Gait not tested.  Etiology for patient episodes not quite clear, concerning for TIA  versus seizure.  However EEG was normal, no shaking jerking or seizure history, no AEDs recommended at this time.  Patient on aspirin 81 at home, recommend aspirin and Plavix for 3 weeks and then Plavix alone.  Put on Lipitor 20, continue on discharge.  Patient had 14 day cardiac event monitoring 1 year ago which showed no A. Fib.  Patient follows with [name] at [facility], will arrange for continued follow up.   Neurology will sign off. Please call with questions. Pt will follow up with [name] at [facility] in about 4 weeks. Thanks for the consult.    Rosalin Hawking, MD PhD Stroke Neurology 06/10/2020 2:28 PM    To contact Stroke Continuity provider, please refer to [email or web address] After hours, contact General Neurology

## 2020-06-10 NOTE — Evaluation (Signed)
Occupational Therapy Evaluation Patient Details Name: SHANIEKA BLEA MRN: [id number] DOB: Jan 18, 1924 Today's Date: 06/10/2020    History of Present Illness Pt adm with aphasia and fixed gaze. CT was negative. Neuro work up in progress for TIA and seizure. PMH - dementia, meningioma, htn, chf, dm, ckd.   Clinical Impression   Pt PTA:Pt living with son and independent with ADL,but supervised with bathing/dressing; ambulatory with SPC.  Pt currently, limited by disorientation, decreased safety awareness and decreased ability to care for self. Pt ambulatory in room with [facility] and occassional assist for BUE support. Pt standing for toilet hygiene and standing at sink for grooming. Pt minA overall for ADL and minguardA for mobility with SPC. Pt's son in room for questionnaire portion and confirmed 24/7 assist available at home. Pt would benefit from continued OT skilled services for safety with ADL and cognition. OT following acutely.    Follow Up Recommendations  Home health OT;Supervision/Assistance - 24 hour    Equipment Recommendations  None recommended by OT    Recommendations for Other Services       Precautions / Restrictions Precautions Precautions: Fall Precaution Comments: HOH Restrictions Weight Bearing Restrictions: No      Mobility Bed Mobility               General bed mobility comments: in recliner pre and post session    Transfers Overall transfer level: Needs assistance Equipment used: Straight cane Transfers: Sit to/from Stand;Stand Pivot Transfers Sit to Stand: Min guard Stand pivot transfers: Min guard       General transfer comment: assist for ADL and mobility with SPC    Balance Overall balance assessment: Needs assistance Sitting-balance support: Bilateral upper extremity supported Sitting balance-Leahy Scale: Fair     Standing balance support: No upper extremity supported;During functional activity Standing balance-Leahy Scale:  Good Standing balance comment: standing at commode to reach for pericare with BUEs unsupported.                           ADL either performed or assessed with clinical judgement   ADL Overall ADL's : Needs assistance/impaired Eating/Feeding: Modified independent;Sitting   Grooming: Supervision/safety;Standing   Upper Body Bathing: Set up;Sitting   Lower Body Bathing: Supervison/ safety;Min guard;Sitting/lateral leans;Sit to/from stand;Cueing for safety   Upper Body Dressing : Set up;Sitting   Lower Body Dressing: Supervision/safety;Sitting/lateral leans;Sit to/from stand Lower Body Dressing Details (indicate cue type and reason): donning shoes and socks with mixture of figure 4 technique and bending over. Toilet Transfer: Min guard;Cueing for safety;Ambulation;RW;Grab bars Toilet Transfer Details (indicate cue type and reason): cues for use of grab bar; both hands on grab bar Toileting- Clothing Manipulation and Hygiene: Supervision/safety;Minimal assistance;Sitting/lateral lean;Sit to/from stand;Cueing for safety Toileting - Clothing Manipulation Details (indicate cue type and reason): Pt supervisionA to minA to clean pt up as pt stating "I have to go to the bathroom     Functional mobility during ADLs: Min guard;Cueing for safety General ADL Comments: Pt limited by disorientation, decreased safety awareness and decreased ability to care for self. Pt ambulatory in room with [facility] and occassional assist for BUE support. Pt standing for toilet hygiene and standing at sink for grooming.     Vision Baseline Vision/History: Wears glasses Wears Glasses: At all times Patient Visual Report: No change from baseline Vision Assessment?: No apparent visual deficits Additional Comments: able read menu and clock     Perception     Praxis  Pertinent Vitals/Pain Pain Assessment: Faces Faces Pain Scale: Hurts a little bit Pain Location: b/l knees Pain Descriptors /  Indicators: Discomfort;Sore Pain Intervention(s): Monitored during session;Repositioned     Hand Dominance Right   Extremity/Trunk Assessment Upper Extremity Assessment Upper Extremity Assessment: Generalized weakness   Lower Extremity Assessment Lower Extremity Assessment: Generalized weakness   Cervical / Trunk Assessment Cervical / Trunk Assessment: Kyphotic   Communication Communication Communication: HOH   Cognition Arousal/Alertness: Awake/alert Behavior During Therapy: WFL for tasks assessed/performed Overall Cognitive Status: Impaired/Different from baseline Area of Impairment: Orientation;Safety/judgement;Awareness                 Orientation Level: Disoriented to;Place;Time;Situation       Safety/Judgement: Decreased awareness of safety;Decreased awareness of deficits Awareness: Emergent   General Comments: Pt was alert, but disoriented to date and situation, oriented to location with cues. Pt requires cues for safety, but is able to sequence through task with increased time (at baseline as well).   General Comments  VSS on RA. No focal deficits noted. Pt continues to have clear speech and no weakness on RUE.    Exercises     Shoulder Instructions      Home Living Family/patient expects to be discharged to:: Private residence Living Arrangements: Children Available Help at Discharge: Family;Available 24 hours/day;Personal care attendant;Other (Comment) (will be new to have PCA for bathe/dress) Type of Home: House Home Access: Stairs to enter Entrance Stairs-Number of Steps: 2 steps from patio Entrance Stairs-Rails: Can reach both Home Layout: Multi-level;Able to live on main level with bedroom/bathroom Alternate Level Stairs-Number of Steps: 3 steps to ground level   Bathroom Shower/Tub: Teacher, early years/pre: Standard     Home Equipment: Environmental consultant - 2 wheels;Shower seat          Prior Functioning/Environment Level of  Independence: Independent with assistive device(s)        Comments: Son lives with her, but leave pt home alone at times. Pt has supervisionA for bathing and dressing;pt can walk with SPC and has RW, but does not usually use it. Pt performing light meal making at home and family is available 24/7 between 2 sons and a PCA that they plan to hire for bathing/dressing.        OT Problem List: Decreased activity tolerance;Impaired balance (sitting and/or standing);Decreased cognition;Decreased safety awareness      OT Treatment/Interventions: Self-care/ADL training;Therapeutic exercise;Energy conservation;DME and/or AE instruction;Therapeutic activities;Visual/perceptual remediation/compensation;Balance training;Patient/family education    OT Goals(Current goals can be found in the care plan section) Acute Rehab OT Goals Patient Stated Goal: to go home OT Goal Formulation: With patient Time For Goal Achievement: 06/24/20 Potential to Achieve Goals: Good ADL Goals Additional ADL Goal #1: Pt will perform x3 mins of trail making tasks in order to increase safety with ADL functional mobility. Additional ADL Goal #2: Pt will increase to supervisionA for  OOB ADL to increase independence with <2 cues for safety.  OT Frequency: Min 2X/week   Barriers to D/C:            Co-evaluation              AM-PAC OT "6 Clicks" Daily Activity     Outcome Measure Help from another person eating meals?: None Help from another person taking care of personal grooming?: A Little Help from another person toileting, which includes using toliet, bedpan, or urinal?: A Little Help from another person bathing (including washing, rinsing, drying)?: A Little Help from another person  to put on and taking off regular upper body clothing?: None Help from another person to put on and taking off regular lower body clothing?: A Little 6 Click Score: 20   End of Session Equipment Utilized During Treatment: Other  (comment) [facility]) Nurse Communication: Mobility status;Other (comment) (NT in room)  Activity Tolerance: Patient tolerated treatment well Patient left: Other (comment) (on commode with NT in room; saw NT turn on chair alarm and have it ready when pt sits down)  OT Visit Diagnosis: Unsteadiness on feet (R26.81);Muscle weakness (generalized) (M62.81)                Time: 1275-1700 OT Time Calculation (min): 24 min Charges:  OT General Charges $OT Visit: 1 Visit OT Evaluation $OT Eval Moderate Complexity: 1 Mod OT Treatments $Self Care/Home Management : 8-22 mins  Jefferey Pica, OTR/L Acute Rehabilitation Services Pager: [phone] Office: [phone]   Jocsan Mcginley C 06/10/2020, 10:38 AM

## 2020-06-10 NOTE — Care Management Obs Status (Signed)
[place] NOTIFICATION   Patient Details  Name: Brenda Lynch MRN: [id number] Date of Birth: October 13, 1923   Medicare Observation Status Notification Given:  Yes    Joanne Chars, LCSW 06/10/2020, 3:43 PM

## 2020-06-10 NOTE — ED Notes (Signed)
Patient agitated at this time, disoriented, attempting to get up from bed, Son at bedside attempted to reorient w/o success. Patient anxious, pulling at lead, demanding to have them taken off. At this time leads left off of patient in attempt to calm.

## 2020-06-10 NOTE — Evaluation (Signed)
Physical Therapy Evaluation Patient Details Name: Brenda Lynch MRN: [id number] DOB: 1923-04-30 Today's Date: 06/10/2020   History of Present Illness  Pt adm with aphasia and fixed gaze. CT was negative. Neuro work up in progress for TIA and seizure. PMH - dementia, meningioma, htn, chf, dm, ckd.  Clinical Impression  Pt admitted with above diagnosis and presents to PT with functional limitations due to deficits listed below (See PT problem list). Pt needs skilled PT to maximize independence and safety to allow discharge to home with family.      Follow Up Recommendations Home health PT    Equipment Recommendations  None recommended by PT (Pt/Son want to wait to decide at home about rollator)    Recommendations for Other Services       Precautions / Restrictions Precautions Precautions: Fall Precaution Comments: HOH Restrictions Weight Bearing Restrictions: No      Mobility  Bed Mobility               General bed mobility comments: Pt up in chair    Transfers Overall transfer level: Needs assistance Equipment used: Straight cane Transfers: Sit to/from Stand;Stand Pivot Transfers Sit to Stand: Min guard Stand pivot transfers: Min guard       General transfer comment: Assist for balance  Ambulation/Gait Ambulation/Gait assistance: Min assist;Min guard Gait Distance (Feet): 500 Feet Assistive device: Straight cane;4-wheeled walker;1 person hand held assist Gait Pattern/deviations: Step-through pattern;Decreased stride length Gait velocity: decr Gait velocity interpretation: <1.31 ft/sec, indicative of household ambulator General Gait Details: Min assist for balance when using cane. Reaches with free hand for hand held when using cane. Using rollator pt steadier and only min guard for safety  Stairs            Wheelchair Mobility    Modified Rankin (Stroke Patients Only)       Balance Overall balance assessment: Needs assistance Sitting-balance  support: Bilateral upper extremity supported Sitting balance-Leahy Scale: Fair     Standing balance support: No upper extremity supported;During functional activity Standing balance-Leahy Scale: Fair Standing balance comment: standing at commode to reach for pericare with BUEs unsupported.                             Pertinent Vitals/Pain Pain Assessment: Faces Faces Pain Scale: Hurts little more Pain Location: anus Pain Descriptors / Indicators: Burning Pain Intervention(s): Limited activity within patient's tolerance;Repositioned    Home Living Family/patient expects to be discharged to:: Private residence Living Arrangements: Children Available Help at Discharge: Family;Available 24 hours/day;Personal care attendant;Other (Comment) (will be new to have PCA for bathe/dress) Type of Home: House Home Access: Stairs to enter Entrance Stairs-Rails: Can reach both Entrance Stairs-Number of Steps: 2 steps from [place]: Multi-level;Able to live on main level with bedroom/bathroom Home Equipment: Gilford Rile - 2 wheels;Shower seat      Prior Function Level of Independence: Independent with assistive device(s)         Comments: Son lives with her, but leave pt home alone at times. Pt has supervisionA for bathing and dressing;pt can walk with SPC and has RW, but does not usually use it. Pt performing light meal making at home and family is available 24/7 between 2 sons and a PCA that they plan to hire for bathing/dressing.     Hand Dominance   Dominant Hand: Right    Extremity/Trunk Assessment   Upper Extremity Assessment Upper Extremity Assessment: Defer to OT evaluation  Lower Extremity Assessment Lower Extremity Assessment: Generalized weakness    Cervical / Trunk Assessment Cervical / Trunk Assessment: Kyphotic  Communication   Communication: HOH  Cognition Arousal/Alertness: Awake/alert Behavior During Therapy: WFL for tasks  assessed/performed Overall Cognitive Status: Impaired/Different from baseline Area of Impairment: Orientation;Safety/judgement;Awareness                 Orientation Level: Disoriented to;Place;Time;Situation       Safety/Judgement: Decreased awareness of safety;Decreased awareness of deficits Awareness: Emergent   General Comments: Pt was alert, but disoriented to date and situation, oriented to location with cues. Pt requires cues for safety, but is able to sequence through task with increased time (at baseline as well).      General Comments General comments (skin integrity, edema, etc.): VSS on RA. No focal deficits noted. Pt continues to have clear speech and no weakness on RUE.    Exercises     Assessment/Plan    PT Assessment Patient needs continued PT services  PT Problem List Decreased strength;Decreased balance;Decreased mobility       PT Treatment Interventions DME instruction;Gait training;Functional mobility training;Therapeutic activities;Therapeutic exercise;Balance training;Patient/family education    PT Goals (Current goals can be found in the Care Plan section)  Acute Rehab PT Goals Patient Stated Goal: to go home PT Goal Formulation: With patient Time For Goal Achievement: 06/17/20 Potential to Achieve Goals: Good    Frequency Min 3X/week   Barriers to discharge        Co-evaluation               AM-PAC PT "6 Clicks" Mobility  Outcome Measure Help needed turning from your back to your side while in a flat bed without using bedrails?: None Help needed moving from lying on your back to sitting on the side of a flat bed without using bedrails?: None Help needed moving to and from a bed to a chair (including a wheelchair)?: A Little Help needed standing up from a chair using your arms (e.g., wheelchair or bedside chair)?: A Little Help needed to walk in hospital room?: A Little Help needed climbing 3-5 steps with a railing? : A Little 6  Click Score: 20    End of Session Equipment Utilized During Treatment: Gait belt Activity Tolerance: Patient tolerated treatment well Patient left: in chair;with call bell/phone within reach;with chair alarm set Nurse Communication: Mobility status PT Visit Diagnosis: Unsteadiness on feet (R26.81)    Time: 2353-6144 PT Time Calculation (min) (ACUTE ONLY): 26 min   Charges:   PT Evaluation $PT Eval Moderate Complexity: 1 Mod PT Treatments $Gait Training: 8-22 mins        [place] Pager [phone] Office [place] 06/10/2020, 11:54 AM

## 2020-06-10 NOTE — TOC Initial Note (Signed)
Transition of Care [facility]) - Initial/Assessment Note    Patient Details  Name: Brenda Lynch MRN: [id number] Date of Birth: [birth year]  Transition of Care [facility]) CM/SW Contact:    Joanne Chars, LCSW Phone Number: 06/10/2020, 3:45 PM  Clinical Narrative:    CSW met with pt and son in pt room.  Pt unable to participate in conversation, son Brenda Lynch is POA and provided information.  Pt lives at home with son and is supervised 24/7.  Other family members assist ad son is also in process of hiring an aide to assist as well.  Son agreeable to [facility] recommendation, choice document given.  Pt is vaccinated for covid and boosted.  Current equipment in home: cane, rolling walker, shower chair.               Expected Discharge Plan: [place] Barriers to Discharge: Continued Medical Work up   Patient Goals and CMS Choice   CMS Medicare.gov Compare Post Acute Care list provided to:: Patient Represenative (must comment) Choice offered to / list presented to : Adult Children  Expected Discharge Plan and Services Expected Discharge Plan: [place] Choice: [place] arrangements for the past 2 months: Single Family Home                                      Prior Living Arrangements/Services Living arrangements for the past 2 months: Single Family Home Lives with:: Adult Children Patient language and need for interpreter reviewed:: Yes        Need for Family Participation in Patient Care: Yes (Comment) Care giver support system in place?: Yes (comment) Current home services: Other (comment) (none) Criminal Activity/Legal Involvement Pertinent to Current Situation/Hospitalization: No - Comment as needed  Activities of Daily Living Home Assistive Devices/Equipment: None ADL Screening (condition at time of admission) Patient's cognitive ability adequate to safely complete daily activities?: Yes Is the patient deaf or  have difficulty hearing?: Yes Does the patient have difficulty seeing, even when wearing glasses/contacts?: No Does the patient have difficulty concentrating, remembering, or making decisions?: Yes Patient able to express need for assistance with ADLs?: Yes Does the patient have difficulty dressing or bathing?: No Independently performs ADLs?: Yes (appropriate for developmental age) Does the patient have difficulty walking or climbing stairs?: Yes Weakness of Legs: None Weakness of Arms/Hands: None  Permission Sought/Granted                  Emotional Assessment Appearance:: Appears stated age Attitude/Demeanor/Rapport: Unable to Assess Affect (typically observed): Unable to Assess Orientation: : Oriented to Self Alcohol / Substance Use: Not Applicable Psych Involvement: No (comment)  Admission diagnosis:  TIA (transient ischemic attack) [G45.9] Patient Active Problem List   Diagnosis Date Noted  . TIA (transient ischemic attack) 06/09/2020  . Type 2 diabetes mellitus with hyperlipidemia ([place]) 06/09/2020  . Dementia without behavioral disturbance ([place]) 06/09/2020  . Presbycusis of both ears 09/14/2016  . Drug-induced bradycardia 01/28/2016  . Cephalalgia 09/16/2015  . Orthostatic dizziness 09/16/2015  . Post-nasal drainage 09/16/2015  . SOB (shortness of breath) 07/29/2013  . Hypertension   . Diastolic dysfunction   . Hypercholesterolemia   . Diabetes mellitus   . Chronic diastolic CHF (congestive heart failure) ([place])    PCP:  [name], MD Pharmacy:   [facility],  [place] [address] Phone: [phone] Fax: [phone]     Social Determinants of Health (SDOH) Interventions    Readmission Risk Interventions No flowsheet data found.

## 2020-06-10 NOTE — Procedures (Signed)
EEG Report Indication: possible seizure activity--episodes of unresponsiveness  This study was recorded in the waking/drowsy state.  The duration of the study was 38 minutes.  Electrodes were placed according to the International 10/20 system.  Video was available for clinical correlation as needed.  In the waking state, fairly clear background organization is seen with a distinct anterior - posterior voltage and frequency gradient and a symmetric and reactive posterior dominant rhythm of approximately 8-9 hertz.  Anteriorly, is the expected pattern of faster frequency, lower voltage waveforms.  During the drowsy state, there is attenuation of the gradient, a general shift to slower frequencies diffusely, a waxing and waning of the posterior dominant rhythm, and slow roving eye movements.  There was paroxysmal high amplitude fairly diffuse delta/theta slowing--this was polymorphic, and variable in frequency (roughly 2-5 Hz)--this was seen only during drowsiness, and favored to represent a benign variant vs the intrusion of some sleep architecture (a finding seen in dementia).  On video the patient has her eyes closed and appears to be sleeping.  Hyperventilation: Deferred Photic stimulation: Unremarkable   There are no clear focal or epileptiform abnormalities  or interhemispheric asymmetries.  Impression:  This is an essentially normal waking/drowsy/possibly very brief sleep study--some paroxysmal slowing is seen during drowsiness/sleep, although this is favored to represent either poorly formed sleep architecture vs benign/drowsy variant--at worst it could suggest a mild encephalopathy.  There are no clear focal or epileptiform abnormalities.

## 2020-06-10 NOTE — Plan of Care (Incomplete)
Patient denies pain or discomfort

## 2020-06-10 NOTE — ED Notes (Signed)
Attempted report at this time.

## 2020-06-10 NOTE — Progress Notes (Signed)
SLP Cancellation Note  Patient Details Name: Brenda Lynch MRN: [id number] DOB: [birth year]   Cancelled treatment:       Reason Eval/Treat Not Completed: SLP screened, no needs identified, will sign off  Pt passed [facility] swallow screen and Tammi Klippel, RN reported that the pt has been tolerating the regular texture diet without difficulty; therefore, no formalized SLP swallow eval is needed per protocol. Will sign off.    Shanika I. Hardin Negus, [place], [place] Office number [phone] Pager [place] 06/10/2020, 10:51 AM

## 2020-06-11 DIAGNOSIS — I5032 Chronic diastolic (congestive) heart failure: Secondary | ICD-10-CM | POA: Diagnosis not present

## 2020-06-11 DIAGNOSIS — F0390 Unspecified dementia without behavioral disturbance: Secondary | ICD-10-CM | POA: Diagnosis not present

## 2020-06-11 DIAGNOSIS — I10 Essential (primary) hypertension: Secondary | ICD-10-CM | POA: Diagnosis not present

## 2020-06-11 DIAGNOSIS — E1169 Type 2 diabetes mellitus with other specified complication: Secondary | ICD-10-CM | POA: Diagnosis not present

## 2020-06-11 DIAGNOSIS — E785 Hyperlipidemia, unspecified: Secondary | ICD-10-CM | POA: Diagnosis not present

## 2020-06-11 LAB — MAGNESIUM: Magnesium: 2 mg/dL (ref 1.7–2.4)

## 2020-06-11 LAB — URINE CULTURE: Culture: <10000 — AB

## 2020-06-11 MED ORDER — CLOPIDOGREL BISULFATE 75 MG PO TABS: 75.0000 mg | ORAL_TABLET | Freq: Every day | ORAL | 3 refills | Status: DC

## 2020-06-11 MED ORDER — ASPIRIN 325 MG PO TABS: 325.0000 mg | ORAL_TABLET | Freq: Every day | ORAL | 0 refills | Status: DC

## 2020-06-11 MED ORDER — ATORVASTATIN CALCIUM 20 MG PO TABS: 20.0000 mg | ORAL_TABLET | Freq: Every day | ORAL | 1 refills | Status: DC

## 2020-06-11 NOTE — Discharge Summary (Signed)
Physician Discharge Summary  Brenda Lynch WYO:[id number] DOB: [birth year] DOA: 06/09/2020  PCP: [name], MD  Admit date: 06/09/2020 Discharge date: 06/11/2020  Admitted From: Home.  Disposition: Home.   Recommendations for Outpatient Follow-up:  1. Follow up with PCP in 1-2 weeks 2. Please obtain BMP/CBC in one week 3. Please follow up with neurology as recommended.   Home Health: YES.   Discharge Condition: STABLE.  CODE STATUS:FULL CODE.  Diet recommendation: Heart Healthy   Brief/Interim Summary: Brenda B Cundiffis a 87 y.o.femalewith medical history significant fordementia, meningioma, hypertension, diastolic heart failure, type 2 diabetes, iron deficiency anemia and CKD stage III who presents with concerns of moaning and fixed gaze. CT head negative for any acute findings. CTA head and neck shows right posterior cerebral artery occlusion and other moderate-high grade stenosis of the left PCA.No TPA was given due to suspicion this was more likely a TIA.  Neurology has evaluated patient at bedside and recommended TIA and seizure work-up with hospitalist admission.   Discharge Diagnoses:  Principal Problem:   TIA (transient ischemic attack) Active Problems:   Hypertension   Hypercholesterolemia   Chronic diastolic CHF (congestive heart failure) (HCC)   Type 2 diabetes mellitus with hyperlipidemia (HCC)   Dementia without behavioral disturbance (HCC)   Aphasia// Fixed gaze Differential include TIA vs Seizures. EEG is negative for seizures.  MRI could not be completed. Partial scan does not show any acute stroke.  Neurology recommended aspirin and plavix for 3 weeks, followed by plavix alone.  continue with lipitor 20 mg daily.  LDL is 118, Hemoglobin A1c is 6.1. echocardiogram showed LVEF of 65% , no regional wall abn, severe AV calcification, no AV stenosis. Grade 1 diastolic dysfunction.     Hypertension;  Optimal BP parameters.     Hyperlipidemia:  Resume lipitor 20 mg daily.    Chronic diastolic heart failure;  She appears compensated.    Dementia:  Not agitated.     Type 2 DM Diet controlled.   Hyponatremia:  Possibly from dehydration.  Improved with hydration.   Discharge Instructions  Discharge Instructions    Ambulatory referral to Neurology   Complete by: As directed    Follow up with Brenda. Jaynee Eagles at [facility] in 4-6 weeks. This is Brenda Lynch pt. Thanks.   Diet - low sodium heart healthy   Complete by: As directed    Discharge instructions   Complete by: As directed    Please follow up with Neurology as recommended.     Allergies as of 06/11/2020      Reactions   Ciprofloxacin Nausea Only, Other (See Comments)   GI intolerance   Penicillins Hives, Other (See Comments)   Welts, also Has patient had a PCN reaction causing immediate rash, facial/tongue/throat swelling, SOB or lightheadedness with hypotension: Yes Has patient had a PCN reaction causing severe rash involving mucus membranes or skin necrosis: No Has patient had a PCN reaction that required hospitalization No Has patient had a PCN reaction occurring within the last 10 years: No If all of the above answers are "NO", then may proceed with Cephalosporin use.   Penicillin G Hives, Other (See Comments)   Welts on the body      Medication List    STOP taking these medications   Bayer Low Dose 81 MG EC tablet Generic drug: aspirin Replaced by: aspirin 325 MG tablet     TAKE these medications   acetaminophen 500 MG tablet Commonly known as: TYLENOL Take 500 mg  by mouth every 6 (six) hours as needed (for headaches or mild pain).   Align 4 MG Caps Take 4 mg by mouth daily as needed (to support G.I. health).   aspirin 325 MG tablet Take 1 tablet (325 mg total) by mouth daily. Start taking on: June 12, 2020 Replaces: Bayer Low Dose 81 MG EC tablet   atorvastatin 20 MG tablet Commonly known as: LIPITOR Take 1  tablet (20 mg total) by mouth daily. Start taking on: June 12, 2020   calcium carbonate 500 MG chewable tablet Commonly known as: TUMS - dosed in mg elemental calcium Chew 1 tablet by mouth daily as needed for indigestion or heartburn.   calcium citrate-vitamin D 315-200 MG-UNIT tablet Commonly known as: CITRACAL+D Take 1 tablet by mouth daily.   cetirizine 10 MG tablet Commonly known as: ZYRTEC Take 10 mg by mouth at bedtime as needed for allergies or rhinitis.   clopidogrel 75 MG tablet Commonly known as: PLAVIX Take 1 tablet (75 mg total) by mouth daily. Start taking on: June 12, 2020   clotrimazole 1 % cream Commonly known as: LOTRIMIN Apply to both feet and between toes twice daily What changed:   how much to take  how to take this  when to take this   Coricidin D Cold/Flu/Sinus 2-5-325 MG Tabs Generic drug: Chlorphen-Phenyleph-APAP Take 1 capsule by mouth every 6 (six) hours as needed (for symptoms).   diphenhydrAMINE 25 MG tablet Commonly known as: BENADRYL Take 25 mg by mouth every 6 (six) hours as needed for allergies.   estradiol 0.1 MG/GM vaginal cream Commonly known as: ESTRACE Place 1 Applicatorful vaginally 2 (two) times a week. Monday and Friday   fluticasone 50 MCG/ACT nasal spray Commonly known as: FLONASE Place 1-2 sprays into both nostrils daily as needed for allergies or rhinitis.   hydrochlorothiazide 12.5 MG capsule Commonly known as: MICROZIDE TAKE (1) CAPSULE DAILY. What changed: See the new instructions.   hydrocortisone 2.5 % rectal cream Commonly known as: ANUSOL-HC Place 1 application rectally 2 (two) times daily as needed for hemorrhoids or anal itching.   hydrocortisone 25 MG suppository Commonly known as: ANUSOL-HC Place 25 mg rectally 2 (two) times daily as needed for hemorrhoids or itching.   hydrOXYzine 25 MG tablet Commonly known as: ATARAX/VISTARIL Take 25 mg by mouth at bedtime.   montelukast 10 MG tablet Commonly  known as: SINGULAIR Take 10 mg by mouth at bedtime.   ONE TOUCH ULTRA TEST test strip Generic drug: glucose blood 1 each by Other route as directed.   onetouch ultrasoft lancets 1 each by Other route as directed.   OSTEO BI-FLEX ADV DOUBLE ST PO Take 1 tablet by mouth daily.   polyethylene glycol powder 17 GM/SCOOP powder Commonly known as: GLYCOLAX/MIRALAX Take 8.5-17 g by mouth daily as needed for mild constipation (MIX AND DRINK).   potassium chloride 10 MEQ CR capsule Commonly known as: MICRO-K Take 10 mEq by mouth in the morning.   PriLOSEC OTC 20 MG tablet Generic drug: omeprazole Take 20 mg by mouth daily before breakfast.   ramipril 10 MG capsule Commonly known as: ALTACE Take 10 mg by mouth in the morning.   senna 8.6 MG Tabs tablet Commonly known as: SENOKOT Take 1 tablet by mouth at bedtime as needed for mild constipation.   Simply Saline 0.9 % Aers Generic drug: Saline Place 1 spray into both nostrils as needed (for congestion).   Systane Hydration PF 0.4-0.3 % Soln Generic drug: Polyethyl Glyc-Propyl  Glyc PF Place 1 drop into both eyes 3 (three) times daily as needed (for irritation).   traZODone 50 MG tablet Commonly known as: DESYREL Take 25 mg by mouth at bedtime as needed for sleep.   Vitamin D3 25 MCG (1000 UT) Caps Take 1,000 Units by mouth daily.       Follow-up Information    Melvenia Beam, MD. Schedule an appointment as soon as possible for a visit in 4 week(s).   Specialty: Neurology Contact information: [place] [address] [address], [facility] Follow up.   Specialty: Home Health Services Why: Jackquline Denmark will contact you to schedule your first home visit.  Local phone number is [phone]. Contact information: [place] [address] [phone]        [name], MD. Schedule an appointment as soon as possible for a visit in 1  week(s).   Specialty: Internal Medicine Contact information: [address] [place] [address] [phone]        Sueanne Margarita, MD .   Specialty: Cardiology Contact information: [phone] [place] [address] [phone]              Allergies  Allergen Reactions  . Ciprofloxacin Nausea Only and Other (See Comments)    GI intolerance  . Penicillins Hives and Other (See Comments)    Welts, also Has patient had a PCN reaction causing immediate rash, facial/tongue/throat swelling, SOB or lightheadedness with hypotension: Yes Has patient had a PCN reaction causing severe rash involving mucus membranes or skin necrosis: No Has patient had a PCN reaction that required hospitalization No Has patient had a PCN reaction occurring within the last 10 years: No If all of the above answers are "NO", then may proceed with Cephalosporin use.  Marland Kitchen Penicillin G Hives and Other (See Comments)    Welts on the body    Consultations:  Neurology.    Procedures/Studies: MR BRAIN WO CONTRAST  Result Date: 06/09/2020 CLINICAL DATA:  TIA.  Aphasia. EXAM: MRI HEAD WITHOUT CONTRAST TECHNIQUE: Multiplanar, multiecho pulse sequences of the brain and surrounding structures were obtained without intravenous contrast. COMPARISON:  MRI head 03/07/2020.  CT head 06/09/2020 FINDINGS: Brain: Limited study.  Patient was not able to complete the study. Negative for acute infarct. Moderate to advanced cerebral atrophy. No hydrocephalus. Calcified mass along the anterior falx compatible with meningioma measuring 13 mm. Vascular: Normal arterial flow voids. Skull and upper cervical spine: Negative Sinuses/Orbits: Paranasal sinuses clear. Bilateral cataract extraction Other: None IMPRESSION: Limited study.  Patient not able to complete the study Negative for acute infarct Extensive atrophy.  13 mm interhemispheric meningioma unchanged. Electronically Signed   By: Franchot Gallo M.D.   On: 06/09/2020 19:31   EEG adult  Result Date: 06/10/2020 Raenette Rover, MD     06/10/2020  1:57 PM EEG Report Indication: possible seizure activity--episodes of unresponsiveness This study was recorded in the waking/drowsy state.  The duration of the study was 38 minutes.  Electrodes were placed according to the International 10/20 system.  Video was available for clinical correlation as needed. In the waking state, fairly clear background organization is seen with a distinct anterior - posterior voltage and frequency gradient and a symmetric and reactive posterior dominant rhythm of approximately 8-9 hertz.  Anteriorly, is the expected pattern of faster frequency, lower voltage waveforms.  During the drowsy state, there is attenuation of the gradient, a general shift to slower frequencies diffusely, a waxing and waning of the posterior dominant rhythm, and slow roving eye movements. There was paroxysmal high amplitude fairly diffuse delta/theta slowing--this was polymorphic, and variable in frequency (roughly 2-5 Hz)--this was seen only during drowsiness, and favored to represent a benign variant vs the intrusion of some sleep architecture (a finding seen in dementia).  On video the patient has her eyes closed and appears to be sleeping. Hyperventilation: Deferred Photic stimulation: Unremarkable There are no clear focal or epileptiform abnormalities  or interhemispheric asymmetries. Impression: This is an essentially normal waking/drowsy/possibly very brief sleep study--some paroxysmal slowing is seen during drowsiness/sleep, although this is favored to represent either poorly formed sleep architecture vs benign/drowsy variant--at worst it could suggest a mild encephalopathy.  There are no clear focal or epileptiform abnormalities.   ECHOCARDIOGRAM COMPLETE  Result Date: 06/10/2020    ECHOCARDIOGRAM REPORT   Patient Name:   KAYSA ROULHAC Eissler Date of Exam: 06/10/2020 Medical Rec #:  [id number]       Height:       62.0 in Accession #:    [id number]     Weight:       118.5 lb Date of Birth:  02/16/1924      BSA:          1.530 m Patient Age:    87 years       BP:           135/64 mmHg Patient Gender: F              HR:           70 bpm. Exam Location:  Inpatient Procedure: 2D Echo, Cardiac Doppler and Color Doppler Indications:    TIA  History:        Patient has no prior history of Echocardiogram examinations.                 TIA.  Sonographer:    Luisa Hart RDCS Referring Phys: [place]  1. Left ventricular ejection fraction, by estimation, is 65 to 70%. The left ventricle has normal function. The left ventricle has no regional wall motion abnormalities. There is mild concentric left ventricular hypertrophy. Left ventricular diastolic parameters are consistent with Grade I diastolic dysfunction (impaired relaxation). Elevated left atrial pressure.  2. Right ventricular systolic function is normal. The right ventricular size is normal. There is normal pulmonary artery systolic pressure.  3. Right atrial size was mildly dilated.  4. The mitral valve is normal in structure. No evidence of mitral valve regurgitation. No evidence of mitral stenosis.  5. Tricuspid valve regurgitation is moderate.  6. The aortic valve is normal in structure. There is severe calcifcation of the aortic valve. There is severe thickening of the aortic valve. Aortic valve regurgitation is trivial. Mild to moderate aortic valve sclerosis/calcification is present, without any evidence of aortic stenosis.  7. The inferior vena cava is normal in size with greater than 50% respiratory variability, suggesting right atrial pressure of 3 mmHg. Conclusion(s)/Recommendation(s): No intracardiac source of embolism detected on this transthoracic study. A transesophageal echocardiogram is recommended to exclude cardiac source of embolism if clinically indicated. FINDINGS  Left Ventricle: Left ventricular ejection  fraction, by estimation, is 65 to 70%. The left ventricle has normal function. The left ventricle has no regional wall motion abnormalities. The left ventricular internal cavity size was normal in size. There is  mild concentric left ventricular hypertrophy. Left ventricular diastolic parameters are consistent with Grade I diastolic dysfunction (impaired relaxation). Elevated left atrial pressure. Right Ventricle: The right ventricular size is normal. No increase in right ventricular wall thickness. Right ventricular systolic function is normal. There is normal pulmonary artery systolic pressure. The tricuspid regurgitant velocity is 2.20 m/s, and  with an assumed right atrial pressure of 3 mmHg, the estimated right ventricular systolic pressure is 93.2 mmHg. Left Atrium: Left atrial size was normal in size. Right Atrium: Right atrial size was mildly dilated. Pericardium: There is no evidence of pericardial effusion. Mitral Valve: The mitral valve is normal in structure. No evidence of mitral valve regurgitation. No evidence of mitral valve stenosis. Tricuspid Valve: The tricuspid valve is normal in structure. Tricuspid valve regurgitation is moderate . No evidence of tricuspid stenosis. Aortic Valve: The aortic valve is normal in structure. There is severe calcifcation of the aortic valve. There is severe thickening of the aortic valve. Aortic valve regurgitation is trivial. Mild to moderate aortic valve sclerosis/calcification is present, without any evidence of aortic stenosis. Aortic valve mean gradient measures 3.0 mmHg. Aortic valve peak gradient measures 6.5 mmHg. Aortic valve area, by VTI measures 1.35 cm. Pulmonic Valve: The pulmonic valve was normal in structure. Pulmonic valve regurgitation is not visualized. No evidence of pulmonic stenosis. Aorta: The aortic root is normal in size and structure. Venous: The inferior vena cava is normal in size with greater than 50% respiratory variability, suggesting  right atrial pressure of 3 mmHg. IAS/Shunts: No atrial level shunt detected by color flow Doppler.  LEFT VENTRICLE PLAX 2D LVIDd:         3.20 cm     Diastology LVIDs:         1.40 cm     LV e' medial:    3.32 cm/s LV PW:         1.10 cm     LV E/e' medial:  21.3 LV IVS:        0.90 cm     LV e' lateral:   7.23 cm/s LVOT diam:     1.30 cm     LV E/e' lateral: 9.8 LV SV:         29 LV SV Index:   19 LVOT Area:     1.33 cm  LV Volumes (MOD) LV vol d, MOD A4C: 36.5 ml LV vol s, MOD A4C: 7.0 ml LV SV MOD A4C:     36.5 ml RIGHT VENTRICLE RV S prime:     16.90 cm/s  PULMONARY VEINS TAPSE (M-mode): 2.2 cm      A Reversal Duration: 106.00 msec                             A Reversal Velocity: 25.30 cm/s                             Diastolic Velocity:  35.57 cm/s                             S/D Velocity:        1.20                             Systolic Velocity:   32.20 cm/s LEFT ATRIUM  Index       RIGHT ATRIUM           Index LA Vol (A4C): 46.8 ml 30.58 ml/m RA Area:     23.60 cm                                   RA Volume:   75.80 ml  49.53 ml/m  AORTIC VALVE                   PULMONIC VALVE AV Area (Vmax):    1.25 cm    PV Vmax:       1.09 m/s AV Area (Vmean):   1.39 cm    PV Vmean:      67.800 cm/s AV Area (VTI):     1.35 cm    PV VTI:        0.199 m AV Vmax:           127.00 cm/s PV Peak grad:  4.8 mmHg AV Vmean:          80.400 cm/s PV Mean grad:  2.0 mmHg AV VTI:            0.218 m AV Peak Grad:      6.5 mmHg AV Mean Grad:      3.0 mmHg LVOT Vmax:         120.00 cm/s LVOT Vmean:        84.000 cm/s LVOT VTI:          0.222 m LVOT/AV VTI ratio: 1.02  AORTA Ao Root diam: 3.30 cm Ao Asc diam:  3.10 cm MITRAL VALVE               TRICUSPID VALVE MV Area (PHT): 3.60 cm    TR Peak grad:   19.4 mmHg MV Decel Time: 211 msec    TR Vmax:        220.00 cm/s MV E velocity: 70.70 cm/s MV A velocity: 96.00 cm/s  SHUNTS MV E/A ratio:  0.74        Systemic VTI:  0.22 m                            Systemic Diam: 1.30 cm  Ena Dawley MD Electronically signed by Ena Dawley MD Signature Date/Time: 06/10/2020/4:48:01 PM    Final    CT HEAD CODE STROKE WO CONTRAST  Result Date: 06/09/2020 CLINICAL DATA:  Code stroke.  Right facial droop, aphasia EXAM: CT HEAD WITHOUT CONTRAST TECHNIQUE: Contiguous axial images were obtained from the base of the skull through the vertex without intravenous contrast. COMPARISON:  03/27/2020 FINDINGS: Motion artifact is present. Brain: No acute intracranial hemorrhage, mass effect, or edema. No definite loss of gray-white differentiation. Prominence of the ventricles and sulci reflects generalized parenchymal volume loss. Patchy and confluent areas of hypoattenuation in the supratentorial white matter are nonspecific but probably reflect chronic microvascular ischemic changes. There is a small partially calcified mass along the anterior falx consistent with a meningioma. These findings are unchanged from the recent prior study. Vascular: No hyperdense vessel. There is intracranial atherosclerotic calcification at the skull base. Skull: Unremarkable. Sinuses/Orbits: No acute abnormality Other: Mastoid air cells are clear. ASPECTS [facility] Stroke Program Early CT Score) - Ganglionic level infarction (caudate, lentiform nuclei, internal capsule, insula, M1-M3 cortex): 7 - Supraganglionic infarction (M4-M6 cortex): 3 Total score (0-10 with  10 being normal): 10 IMPRESSION: Motion artifact is present. No acute intracranial hemorrhage or evidence of acute infarction. These results were communicated to Brenda. Cheral Marker at 2:56 pm on 06/09/2020 by text page via the [facility] messaging system. Electronically Signed   By: Macy Mis M.D.   On: 06/09/2020 15:07   CT ANGIO HEAD CODE STROKE  Result Date: 06/09/2020 CLINICAL DATA:  Neuro deficit, acute, stroke suspected. Additional history provided: Last known normal 1200, right facial droop, bilateral weakness and difficulty speaking. EXAM: CT ANGIOGRAPHY HEAD  AND NECK TECHNIQUE: Multidetector CT imaging of the head and neck was performed using the standard protocol during bolus administration of intravenous contrast. Multiplanar CT image reconstructions and MIPs were obtained to evaluate the vascular anatomy. Carotid stenosis measurements (when applicable) are obtained utilizing NASCET criteria, using the distal internal carotid diameter as the denominator. CONTRAST:  78mL OMNIPAQUE IOHEXOL 350 MG/ML SOLN COMPARISON:  Noncontrast head CT performed earlier the same day 06/09/2020. FINDINGS: CTA NECK FINDINGS Aortic arch: Atherosclerotic plaque within the visualized aortic arch and proximal major branch vessels of the neck. No hemodynamically significant innominate or proximal subclavian artery stenosis. Right carotid system: CCA and ICA patent within the neck without hemodynamically significant stenosis. Minimal atherosclerotic plaque within the carotid system within the neck. Tortuous cervical ICA. Additionally, the mid cervical ICA has a subtly beaded appearance. Left carotid system: CCA and ICA patent within the neck without hemodynamically significant stenosis. Minimal atherosclerotic plaque within the carotid system within the neck. Tortuosity of the proximal cervical ICA. Vertebral arteries: Codominant and patent within the neck without stenosis. Skeleton: Cervical spondylosis with advanced multilevel disc space narrowing, disc bulges, posterior disc osteophytes, uncovertebral hypertrophy and facet arthrosis. C3-C4 grade 1 anterolisthesis. Other neck: No neck mass or cervical lymphadenopathy. Upper chest: No consolidation within the imaged lung apices. Centrilobular and paraseptal emphysema. Partially imaged patulous and debris-filled esophagus. Review of the MIP images confirms the above findings CTA HEAD FINDINGS Anterior circulation: The intracranial internal carotid arteries are patent. Nonstenotic calcified plaque within both vessels. The M1 middle cerebral  arteries are patent. No M2 proximal branch occlusion or high-grade proximal stenosis is identified. The anterior cerebral arteries are patent. 1-2 mm inferiorly projecting vascular protrusions arising from the supraclinoid internal carotid arteries bilaterally which may reflect aneurysms or infundibula (series 9, image 93) (series 9, image 115). 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 Posterior circulation: The intracranial vertebral arteries are patent. The basilar artery is patent. The right posterior cerebral artery is occluded at the level of the P1 segment (series 10, image 22). The left posterior cerebral artery is patent. Moderate stenosis within the P1 left PCA (series 10, image 22). Moderate/severe stenosis within a left PCA branch vessel at the P2/P3 junction (series 12, image 25). Posterior communicating arteries are hypoplastic or absent bilaterally. Apparent high-grade stenosis within the proximal left superior cerebellar artery. Venous sinuses: Within the limitations of contrast timing, no convincing thrombus. Hypoplastic left transverse dural venous sinus. Anatomic variants: As described Review of the MIP images confirms the above findings Emergent results were called by telephone at the time of interpretation on 06/09/2020 at 3:53 pm to provider [facility] , who verbally acknowledged these results. IMPRESSION: CTA neck: 1. The common carotid, internal carotid and vertebral arteries are patent within the neck without hemodynamically significant stenosis. Minimal atherosclerotic plaque within the carotid systems within the neck. 2. The mid cervical right ICA has a subtly  beaded appearance, nonspecific but potentially reflecting sequela of fibromuscular dysplasia. 3.  Aortic Atherosclerosis (ICD10-I70.0). 4.  Emphysema (ICD10-J43.9). CTA head: 1. The right posterior cerebral artery is occluded at the level of the P1  segment. 2. Moderate stenosis within the P1 left PCA. Additional moderate/severe stenosis within the left PCA at the P2/P3 junction. 3. High-grade stenosis within the proximal left superior cerebellar artery. 4. No anterior circulation intracranial large vessel occlusion identified. 5. 1-2 mm vascular protrusions arising from the supraclinoid internal carotid arteries bilaterally, which may reflect aneurysms or infundibula. 6. Ectasia of the left MCA trifurcation without discrete aneurysm at this time. Electronically Signed   By: Kellie Simmering DO   On: 06/09/2020 15:56   CT ANGIO NECK CODE STROKE  Result Date: 06/09/2020 CLINICAL DATA:  Neuro deficit, acute, stroke suspected. Additional history provided: Last known normal 1200, right facial droop, bilateral weakness and difficulty speaking. EXAM: CT ANGIOGRAPHY HEAD AND NECK TECHNIQUE: Multidetector CT imaging of the head and neck was performed using the standard protocol during bolus administration of intravenous contrast. Multiplanar CT image reconstructions and MIPs were obtained to evaluate the vascular anatomy. Carotid stenosis measurements (when applicable) are obtained utilizing NASCET criteria, using the distal internal carotid diameter as the denominator. CONTRAST:  24mL OMNIPAQUE IOHEXOL 350 MG/ML SOLN COMPARISON:  Noncontrast head CT performed earlier the same day 06/09/2020. FINDINGS: CTA NECK FINDINGS Aortic arch: Atherosclerotic plaque within the visualized aortic arch and proximal major branch vessels of the neck. No hemodynamically significant innominate or proximal subclavian artery stenosis. Right carotid system: CCA and ICA patent within the neck without hemodynamically significant stenosis. Minimal atherosclerotic plaque within the carotid system within the neck. Tortuous cervical ICA. Additionally, the mid cervical ICA has a subtly beaded appearance. Left carotid system: CCA and ICA patent within the neck without hemodynamically significant  stenosis. Minimal atherosclerotic plaque within the carotid system within the neck. Tortuosity of the proximal cervical ICA. Vertebral arteries: Codominant and patent within the neck without stenosis. Skeleton: Cervical spondylosis with advanced multilevel disc space narrowing, disc bulges, posterior disc osteophytes, uncovertebral hypertrophy and facet arthrosis. C3-C4 grade 1 anterolisthesis. Other neck: No neck mass or cervical lymphadenopathy. Upper chest: No consolidation within the imaged lung apices. Centrilobular and paraseptal emphysema. Partially imaged patulous and debris-filled esophagus. Review of the MIP images confirms the above findings CTA HEAD FINDINGS Anterior circulation: The intracranial internal carotid arteries are patent. Nonstenotic calcified plaque within both vessels. The M1 middle cerebral arteries are patent. No M2 proximal branch occlusion or high-grade proximal stenosis is identified. The anterior cerebral arteries are patent. 1-2 mm inferiorly projecting vascular protrusions arising from the supraclinoid internal carotid arteries bilaterally which may reflect aneurysms or infundibula (series 9, image 93) (series 9, image 115). 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 Posterior circulation: The intracranial vertebral arteries are patent. The basilar artery is patent. The right posterior cerebral artery is occluded at the level of the P1 segment (series 10, image 22). The left posterior cerebral artery is patent. Moderate stenosis within the P1 left PCA (series 10, image 22). Moderate/severe stenosis within a left PCA branch vessel at the P2/P3 junction (series 12, image 25). Posterior communicating arteries are hypoplastic or absent bilaterally. Apparent high-grade stenosis within the proximal left superior cerebellar artery. Venous sinuses: Within the limitations of contrast timing, no convincing thrombus.  Hypoplastic left transverse dural venous sinus. Anatomic variants: As described Review of the MIP images confirms  the above findings Emergent results were called by telephone at the time of interpretation on 06/09/2020 at 3:53 pm to provider [facility] , who verbally acknowledged these results. IMPRESSION: CTA neck: 1. The common carotid, internal carotid and vertebral arteries are patent within the neck without hemodynamically significant stenosis. Minimal atherosclerotic plaque within the carotid systems within the neck. 2. The mid cervical right ICA has a subtly beaded appearance, nonspecific but potentially reflecting sequela of fibromuscular dysplasia. 3.  Aortic Atherosclerosis (ICD10-I70.0). 4.  Emphysema (ICD10-J43.9). CTA head: 1. The right posterior cerebral artery is occluded at the level of the P1 segment. 2. Moderate stenosis within the P1 left PCA. Additional moderate/severe stenosis within the left PCA at the P2/P3 junction. 3. High-grade stenosis within the proximal left superior cerebellar artery. 4. No anterior circulation intracranial large vessel occlusion identified. 5. 1-2 mm vascular protrusions arising from the supraclinoid internal carotid arteries bilaterally, which may reflect aneurysms or infundibula. 6. Ectasia of the left MCA trifurcation without discrete aneurysm at this time. Electronically Signed   By: Kellie Simmering DO   On: 06/09/2020 15:56      Subjective:  No new complaints.  Discharge Exam: Vitals:   06/11/20 0526 06/11/20 0829  BP: 135/71 (!) 156/77  Pulse: 80 70  Resp: 17 16  Temp: 98 F (36.7 C) 98.2 F (36.8 C)  SpO2: 99% 100%   Vitals:   06/10/20 0030 06/10/20 1401 06/11/20 0526 06/11/20 0829  BP: (!) 184/80 135/64 135/71 (!) 156/77  Pulse: 65 66 80 70  Resp: 16 18 17 16   Temp:  97.8 F (36.6 C) 98 F (36.7 C) 98.2 F (36.8 C)  TempSrc:   Oral Oral  SpO2: 98% 96% 99% 100%  Weight:      Height:        General: Pt is alert, awake, not in  acute distress Cardiovascular: RRR, S1/S2 +, no rubs, no gallops Respiratory: CTA bilaterally, no wheezing, no rhonchi Abdominal: Soft, NT, ND, bowel sounds + Extremities: no edema, no cyanosis    The results of significant diagnostics from this hospitalization (including imaging, microbiology, ancillary and laboratory) are listed below for reference.     Microbiology: Recent Results (from the past 240 hour(s))  SARS CORONAVIRUS 2 (TAT 6-24 HRS) Nasopharyngeal Nasopharyngeal Swab     Status: None   Collection Time: 06/09/20  3:39 PM   Specimen: Nasopharyngeal Swab  Result Value Ref Range Status   SARS Coronavirus 2 NEGATIVE NEGATIVE Final    Comment: (NOTE) SARS-CoV-2 target nucleic acids are NOT DETECTED.  The SARS-CoV-2 RNA is generally detectable in upper and lower respiratory specimens during the acute phase of infection. Negative results do not preclude SARS-CoV-2 infection, do not rule out co-infections with other pathogens, and should not be used as the sole basis for treatment or other patient management decisions. Negative results must be combined with clinical observations, patient history, and epidemiological information. The expected result is Negative.  Fact Sheet for Patients: SugarRoll.be  Fact Sheet for Healthcare Providers: https://www.woods-mathews.com/  This test is not yet approved or cleared by the [place] FDA and  has been authorized for detection and/or diagnosis of SARS-CoV-2 by FDA under an Emergency Use Authorization (EUA). This EUA will remain  in effect (meaning this test can be used) for the duration of the COVID-19 declaration under Se ction 564(b)(1) of the Act, 21 U.S.C. section 360bbb-3(b)(1), unless the authorization is terminated or revoked sooner.  Performed at [facility], [place]  [address] [place], [place] [address]      Labs: BNP (last 3 results) No results for input(s): BNP in  the last 8760 hours. Basic Metabolic Panel: Recent Labs  Lab 06/09/20 1437 06/09/20 1445 06/11/20 0225  NA 129* 131*  --   K 4.0 3.9  --   CL 98 97*  --   CO2 25  --   --   GLUCOSE 137* 134*  --   BUN 11 12  --   CREATININE 1.03* 1.00  --   CALCIUM 9.1  --   --   MG  --   --  2.0   Liver Function Tests: Recent Labs  Lab 06/09/20 1437  AST 18  ALT 12  ALKPHOS 69  BILITOT 0.6  PROT 6.6  ALBUMIN 3.5   No results for input(s): LIPASE, AMYLASE in the last 168 hours. No results for input(s): AMMONIA in the last 168 hours. CBC: Recent Labs  Lab 06/09/20 1437 06/09/20 1445  WBC 6.8  --   NEUTROABS 4.5  --   HGB 13.3 13.6  HCT 39.6 40.0  MCV 95.4  --   PLT 266  --    Cardiac Enzymes: No results for input(s): CKTOTAL, CKMB, CKMBINDEX, TROPONINI in the last 168 hours. BNP: Invalid input(s): POCBNP CBG: Recent Labs  Lab 06/09/20 1439  GLUCAP 138*   D-Dimer No results for input(s): DDIMER in the last 72 hours. Hgb A1c Recent Labs    06/10/20 0221  HGBA1C 6.1*   Lipid Profile Recent Labs    06/10/20 0221  CHOL 181  HDL 52  LDLCALC 118*  TRIG 57  CHOLHDL 3.5   Thyroid function studies Recent Labs    06/10/20 0221  TSH 1.024   Anemia work up No results for input(s): VITAMINB12, FOLATE, FERRITIN, TIBC, IRON, RETICCTPCT in the last 72 hours. Urinalysis    Component Value Date/Time   COLORURINE STRAW (A) 06/09/2020 1708   APPEARANCEUR CLEAR 06/09/2020 1708   LABSPEC 1.021 06/09/2020 1708   PHURINE 7.0 06/09/2020 1708   GLUCOSEU NEGATIVE 06/09/2020 1708   HGBUR NEGATIVE 06/09/2020 1708   BILIRUBINUR NEGATIVE 06/09/2020 1708   KETONESUR NEGATIVE 06/09/2020 1708   PROTEINUR NEGATIVE 06/09/2020 1708   NITRITE NEGATIVE 06/09/2020 1708   LEUKOCYTESUR TRACE (A) 06/09/2020 1708   Sepsis Labs Invalid input(s): PROCALCITONIN,  WBC,  LACTICIDVEN Microbiology Recent Results (from the past 240 hour(s))  SARS CORONAVIRUS 2 (TAT 6-24 HRS) Nasopharyngeal  Nasopharyngeal Swab     Status: None   Collection Time: 06/09/20  3:39 PM   Specimen: Nasopharyngeal Swab  Result Value Ref Range Status   SARS Coronavirus 2 NEGATIVE NEGATIVE Final    Comment: (NOTE) SARS-CoV-2 target nucleic acids are NOT DETECTED.  The SARS-CoV-2 RNA is generally detectable in upper and lower respiratory specimens during the acute phase of infection. Negative results do not preclude SARS-CoV-2 infection, do not rule out co-infections with other pathogens, and should not be used as the sole basis for treatment or other patient management decisions. Negative results must be combined with clinical observations, patient history, and epidemiological information. The expected result is Negative.  Fact Sheet for Patients: SugarRoll.be  Fact Sheet for Healthcare Providers: https://www.woods-mathews.com/  This test is not yet approved or cleared by the [place] FDA and  has been authorized for detection and/or diagnosis of SARS-CoV-2 by FDA under an Emergency Use Authorization (EUA). This EUA will remain  in effect (meaning this test can be used) for the duration of the COVID-19  declaration under Se ction 564(b)(1) of the Act, 21 U.S.C. section 360bbb-3(b)(1), unless the authorization is terminated or revoked sooner.  Performed at [facility], [place] [address], [place],  [address]      Time coordinating discharge: 32 minutes.  SIGNED:   Hosie Poisson, MD  [facility] 06/11/2020, 10:17 AM

## 2020-06-11 NOTE — Discharge Instructions (Signed)
Transient Ischemic Attack A transient ischemic attack (TIA) causes the same symptoms as a stroke, but the symptoms go away quickly. A TIA happens when blood flow to the brain is blocked. Having a TIA means you may be at risk for a stroke. A TIA is a medical emergency. What are the causes? A TIA is caused by a blocked artery in the head or neck. This means the brain does not get the blood supply it needs. A blockage can be caused by:  Fatty buildup in an artery in the head or neck.  A blood clot.  A tear in an artery.  Irritation and swelling (inflammation) of an artery. Sometimes the cause is not known. What increases the risk? Certain things may make you more likely to have a TIA. Some of these are things that you can change, such as:  Being very overweight.  Using products that have nicotine or tobacco.  Taking birth control pills.  Not being active.  Drinking too much alcohol.  Using drugs. Health conditions that may increase your risk include:  High blood pressure.  High cholesterol.  Diabetes.  Heart disease.  A heartbeat that is not regular (atrial fibrillation).  Sickle cell disease.  Sleep problems (sleep apnea).  Long-term diseases that cause irritation and swelling.  Problems with blood clotting. Other risk factors include:  Being over the age of 19.  Being female.  Having a family history of stroke.  Having had blood clots, stroke, TIA, or heart attack in the past.  Having a history of high blood pressure when pregnant (preeclampsia).  Very bad headaches (migraines). What are the signs or symptoms? The symptoms of a TIA are like those of a stroke. They can include:  Weakness or loss of feeling in your face, arm, or leg. This often happens on one side of your body.  Trouble walking.  Trouble moving your arms or legs.  Trouble talking or understanding what people are saying.  Problems with how you see.  Feeling dizzy.  Feeling  confused.  Loss of balance or coordination.  Feeling like you may vomit (nausea) or you vomit.  Having a very bad headache. If you can, note what time you started to have symptoms. Tell your doctor.   How is this treated? The goal of treatment is to lower the risk for a stroke. This may include:  Changes to diet and lifestyle, such as getting regular exercise and stopping smoking.  Taking medicines to: ? Thin the blood. ? Lower blood pressure. ? Lower cholesterol.  Treating other health conditions, such as diabetes. If testing shows that an artery in your brain is narrow, your doctor may recommend a procedure to:  Take the blockage out of your artery (carotid endarterectomy).  Open or widen an artery in your neck (carotid angioplasty and stenting). Follow these instructions at home: Medicines  Take over-the-counter and prescription medicines only as told by your doctor.  If you were told to take aspirin or another medicine to thin your blood, take it exactly as told by your doctor. ? Taking too much of the medicine can cause bleeding. ? Taking too little of the medicine may not work to treat the problem. Eating and drinking  Eat 5 or more servings of fruits and vegetables each day.  Follow instructions from your doctor about your diet. You may need to follow a certain diet to help lower your risk of a stroke. You may need to: ? Eat a diet that is low  in fat and salt. ? Eat foods with a lot of fiber. ? Limit carbohydrates and sugar.  If you drink alcohol: ? Limit how much you have to:  0-1 drink a day for women who are not pregnant.  0-2 drinks a day for men. ? Know how much alcohol is in a drink. In the [place] one drink equals one 12 oz bottle of beer (358m), one 5 oz glass of wine (1413m, or one 1 oz glass of hard liquor (4423m   General instructions  Keep a healthy weight.  Try to get at least 30 minutes of exercise on most days.  Get treatment if you have  sleep problems.  Do not smoke or use any products that contain nicotine or tobacco. If you need help quitting, ask your doctor.  Do not use drugs.  Keep all follow-up visits. Where to find more information  American Stroke Association: www.stroke.org Get help right away if:  You have chest pain.  You have a heartbeat that is not regular.  You have any signs of a stroke. "BE FAST" is an easy way to remember the main warning signs: ? B - Balance. Dizziness, sudden trouble walking, or loss of balance. ? E - Eyes. Trouble seeing or a change in how you see. ? F - Face. Sudden weakness or loss of feeling of the face. The face or eyelid may droop on one side. ? A - Arms. Weakness or loss of feeling in an arm. This happens all of a sudden and most often on one side of the body. ? S - Speech. Sudden trouble speaking, slurred speech, or trouble understanding what people say. ? T - Time. Time to call emergency services. Write down what time symptoms started.  You have other signs of a stroke, such as: ? A sudden, very bad headache with no known cause. ? Feeling like you may vomit. ? Vomiting. ? A seizure. These symptoms may be an emergency. Get help right away. Call your local emergency services (911 in the [place]  Do not wait to see if the symptoms will go away.  Do not drive yourself to the hospital. Summary  A transient ischemic attack (TIA) happens when an artery in the head or neck is blocked. This causes the same symptoms as a stroke. The symptoms go away quickly.  A TIA is a medical emergency. Get help right away, even if your symptoms go away.  Having a TIA means that you may be at risk for a stroke. Taking medicines and making diet and lifestyle changes can help to prevent a stroke. This information is not intended to replace advice given to you by your health care provider. Make sure you discuss any questions you have with your health care provider. Document Revised:  09/30/2019 Document Reviewed: 09/30/2019 Elsevier Patient Education  [address]

## 2020-06-11 NOTE — TOC Transition Note (Signed)
Transition of Care [facility]) - CM/SW Discharge Note   Patient Details  Name: Brenda Lynch MRN: [id number] Date of Birth: 03-13-1924  Transition of Care [facility] CM/SW Contact:  Joanne Chars, LCSW Phone Number: 06/11/2020, 11:11 AM   Clinical Narrative:   Pt discharging home with [facility].  No equipment needs.  Son will provide transportation.  No other needs identified.     Final next level of care: Home w Home Health Services Barriers to Discharge: Barriers Resolved   Patient Goals and CMS Choice   CMS Medicare.gov Compare Post Acute Care list provided to:: Patient Represenative (must comment) Choice offered to / list presented to : Adult Children  Discharge Placement                       Discharge Plan and Services     Post Acute Care Choice: Home Health          DME Arranged: N/A         HH Arranged: PT,OT [place] Agency: [facility] Date [place] Agency Contacted: 06/11/20 Time [place]: [phone] Representative spoke with at [place]: [place]  Social Determinants of Health ([place]) Interventions     Readmission Risk Interventions No flowsheet data found.

## 2020-06-13 DIAGNOSIS — F0280 Dementia in other diseases classified elsewhere without behavioral disturbance: Secondary | ICD-10-CM | POA: Diagnosis not present

## 2020-06-13 DIAGNOSIS — M47812 Spondylosis without myelopathy or radiculopathy, cervical region: Secondary | ICD-10-CM | POA: Diagnosis not present

## 2020-06-13 DIAGNOSIS — G40909 Epilepsy, unspecified, not intractable, without status epilepticus: Secondary | ICD-10-CM | POA: Diagnosis not present

## 2020-06-13 DIAGNOSIS — I130 Hypertensive heart and chronic kidney disease with heart failure and stage 1 through stage 4 chronic kidney disease, or unspecified chronic kidney disease: Secondary | ICD-10-CM | POA: Diagnosis not present

## 2020-06-13 DIAGNOSIS — N183 Chronic kidney disease, stage 3 unspecified: Secondary | ICD-10-CM | POA: Diagnosis not present

## 2020-06-13 DIAGNOSIS — I69392 Facial weakness following cerebral infarction: Secondary | ICD-10-CM | POA: Diagnosis not present

## 2020-06-13 DIAGNOSIS — I5032 Chronic diastolic (congestive) heart failure: Secondary | ICD-10-CM | POA: Diagnosis not present

## 2020-06-13 DIAGNOSIS — E1122 Type 2 diabetes mellitus with diabetic chronic kidney disease: Secondary | ICD-10-CM | POA: Diagnosis not present

## 2020-06-13 DIAGNOSIS — I69320 Aphasia following cerebral infarction: Secondary | ICD-10-CM | POA: Diagnosis not present

## 2020-06-14 ENCOUNTER — Other Ambulatory Visit: Payer: Self-pay | Admitting: Neurology

## 2020-06-14 DIAGNOSIS — R519 Headache, unspecified: Secondary | ICD-10-CM

## 2020-06-14 DIAGNOSIS — R51 Headache with orthostatic component, not elsewhere classified: Secondary | ICD-10-CM

## 2020-06-14 DIAGNOSIS — R400 Somnolence: Secondary | ICD-10-CM

## 2020-06-14 DIAGNOSIS — R0683 Snoring: Secondary | ICD-10-CM

## 2020-06-15 ENCOUNTER — Other Ambulatory Visit: Payer: Self-pay

## 2020-06-15 ENCOUNTER — Emergency Department (HOSPITAL_COMMUNITY)
Admission: EM | Admit: 2020-06-15 | Discharge: 2020-06-15 | Disposition: A | Discharge status: Home / Self Care | Payer: Medicare PPO | Attending: Emergency Medicine | Admitting: Emergency Medicine

## 2020-06-15 ENCOUNTER — Emergency Department (HOSPITAL_COMMUNITY): Payer: Medicare PPO

## 2020-06-15 ENCOUNTER — Encounter (HOSPITAL_COMMUNITY): Payer: Self-pay

## 2020-06-15 ENCOUNTER — Telehealth: Payer: Self-pay | Admitting: Neurology

## 2020-06-15 DIAGNOSIS — E1122 Type 2 diabetes mellitus with diabetic chronic kidney disease: Secondary | ICD-10-CM | POA: Diagnosis not present

## 2020-06-15 DIAGNOSIS — I2510 Atherosclerotic heart disease of native coronary artery without angina pectoris: Secondary | ICD-10-CM | POA: Insufficient documentation

## 2020-06-15 DIAGNOSIS — R531 Weakness: Secondary | ICD-10-CM | POA: Diagnosis not present

## 2020-06-15 DIAGNOSIS — I5032 Chronic diastolic (congestive) heart failure: Secondary | ICD-10-CM | POA: Insufficient documentation

## 2020-06-15 DIAGNOSIS — N182 Chronic kidney disease, stage 2 (mild): Secondary | ICD-10-CM | POA: Diagnosis not present

## 2020-06-15 DIAGNOSIS — I130 Hypertensive heart and chronic kidney disease with heart failure and stage 1 through stage 4 chronic kidney disease, or unspecified chronic kidney disease: Secondary | ICD-10-CM | POA: Diagnosis not present

## 2020-06-15 DIAGNOSIS — F0390 Unspecified dementia without behavioral disturbance: Secondary | ICD-10-CM | POA: Insufficient documentation

## 2020-06-15 DIAGNOSIS — Z7982 Long term (current) use of aspirin: Secondary | ICD-10-CM | POA: Diagnosis not present

## 2020-06-15 DIAGNOSIS — Z87891 Personal history of nicotine dependence: Secondary | ICD-10-CM | POA: Diagnosis not present

## 2020-06-15 DIAGNOSIS — Z8673 Personal history of transient ischemic attack (TIA), and cerebral infarction without residual deficits: Secondary | ICD-10-CM | POA: Diagnosis not present

## 2020-06-15 DIAGNOSIS — G319 Degenerative disease of nervous system, unspecified: Secondary | ICD-10-CM | POA: Diagnosis not present

## 2020-06-15 DIAGNOSIS — R4702 Dysphasia: Secondary | ICD-10-CM | POA: Diagnosis not present

## 2020-06-15 DIAGNOSIS — G459 Transient cerebral ischemic attack, unspecified: Secondary | ICD-10-CM

## 2020-06-15 DIAGNOSIS — Z7902 Long term (current) use of antithrombotics/antiplatelets: Secondary | ICD-10-CM | POA: Diagnosis not present

## 2020-06-15 DIAGNOSIS — Z79899 Other long term (current) drug therapy: Secondary | ICD-10-CM | POA: Insufficient documentation

## 2020-06-15 DIAGNOSIS — R29810 Facial weakness: Secondary | ICD-10-CM | POA: Diagnosis not present

## 2020-06-15 DIAGNOSIS — R4701 Aphasia: Secondary | ICD-10-CM | POA: Diagnosis not present

## 2020-06-15 LAB — COMPREHENSIVE METABOLIC PANEL
ALT: 12 U/L (ref 0–44)
AST: 19 U/L (ref 15–41)
Albumin: 3.3 g/dL — ABNORMAL LOW (ref 3.5–5.0)
Alkaline Phosphatase: 73 U/L (ref 38–126)
Anion gap: 8 (ref 5–15)
BUN: 12 mg/dL (ref 8–23)
CO2: 25 mmol/L (ref 22–32)
Calcium: 8.8 mg/dL — ABNORMAL LOW (ref 8.9–10.3)
Chloride: 98 mmol/L (ref 98–111)
Creatinine, Ser: 1.1 mg/dL — ABNORMAL HIGH (ref 0.44–1.00)
GFR, Estimated: 47 mL/min — ABNORMAL LOW (ref >60)
Glucose, Bld: 79 mg/dL (ref 70–99)
Potassium: 4.2 mmol/L (ref 3.5–5.1)
Sodium: 131 mmol/L — ABNORMAL LOW (ref 135–145)
Total Bilirubin: 0.5 mg/dL (ref 0.3–1.2)
Total Protein: 6.2 g/dL — ABNORMAL LOW (ref 6.5–8.1)

## 2020-06-15 LAB — APTT: aPTT: 30 seconds (ref 24–36)

## 2020-06-15 LAB — I-STAT CHEM 8, ED
BUN: 15 mg/dL (ref 8–23)
Calcium, Ion: 1.06 mmol/L — ABNORMAL LOW (ref 1.15–1.40)
Chloride: 98 mmol/L (ref 98–111)
Creatinine, Ser: 1.2 mg/dL — ABNORMAL HIGH (ref 0.44–1.00)
Glucose, Bld: 80 mg/dL (ref 70–99)
HCT: 40 % (ref 36.0–46.0)
Hemoglobin: 13.6 g/dL (ref 12.0–15.0)
Potassium: 4.2 mmol/L (ref 3.5–5.1)
Sodium: 132 mmol/L — ABNORMAL LOW (ref 135–145)
TCO2: 28 mmol/L (ref 22–32)

## 2020-06-15 LAB — URINALYSIS, ROUTINE W REFLEX MICROSCOPIC
Bilirubin Urine: NEGATIVE
Glucose, UA: NEGATIVE mg/dL
Hgb urine dipstick: NEGATIVE
Ketones, ur: NEGATIVE mg/dL
Leukocytes,Ua: NEGATIVE
Nitrite: NEGATIVE
Protein, ur: NEGATIVE mg/dL
Specific Gravity, Urine: 1.006 (ref 1.005–1.030)
pH: 6 (ref 5.0–8.0)

## 2020-06-15 LAB — CBC
HCT: 37.9 % (ref 36.0–46.0)
Hemoglobin: 13.2 g/dL (ref 12.0–15.0)
MCH: 32.9 pg (ref 26.0–34.0)
MCHC: 34.8 g/dL (ref 30.0–36.0)
MCV: 94.5 fL (ref 80.0–100.0)
Platelets: 281 10*3/uL (ref 150–400)
RBC: 4.01 MIL/uL (ref 3.87–5.11)
RDW: 13.5 % (ref 11.5–15.5)
WBC: 5.8 10*3/uL (ref 4.0–10.5)
nRBC: 0 % (ref 0.0–0.2)

## 2020-06-15 LAB — ETHANOL: Alcohol, Ethyl (B): <10 mg/dL (ref <10)

## 2020-06-15 LAB — RAPID URINE DRUG SCREEN, HOSP PERFORMED
Amphetamines: NOT DETECTED
Barbiturates: NOT DETECTED
Benzodiazepines: NOT DETECTED
Cocaine: NOT DETECTED
Opiates: NOT DETECTED
Tetrahydrocannabinol: NOT DETECTED

## 2020-06-15 LAB — PROTIME-INR
INR: 1 (ref 0.8–1.2)
Prothrombin Time: 12.9 seconds (ref 11.4–15.2)

## 2020-06-15 LAB — DIFFERENTIAL
Abs Immature Granulocytes: 0.02 10*3/uL (ref 0.00–0.07)
Basophils Absolute: 0 10*3/uL (ref 0.0–0.1)
Basophils Relative: 1 %
Eosinophils Absolute: 0.1 10*3/uL (ref 0.0–0.5)
Eosinophils Relative: 2 %
Immature Granulocytes: 0 %
Lymphocytes Relative: 32 %
Lymphs Abs: 1.8 10*3/uL (ref 0.7–4.0)
Monocytes Absolute: 0.5 10*3/uL (ref 0.1–1.0)
Monocytes Relative: 8 %
Neutro Abs: 3.3 10*3/uL (ref 1.7–7.7)
Neutrophils Relative %: 57 %

## 2020-06-15 MED ORDER — LORAZEPAM 2 MG/ML IJ SOLN
1.0000 mg | Freq: Once | INTRAMUSCULAR | Status: AC
  Administered 2020-06-15: 1 mg via INTRAMUSCULAR
  Filled 2020-06-15: qty 1

## 2020-06-15 MED ORDER — LORAZEPAM 2 MG/ML IJ SOLN: 1.0000 mg | Freq: Once | INTRAMUSCULAR | Status: DC

## 2020-06-15 NOTE — ED Notes (Signed)
Pt pulled out her IV and acting agitated. Pt has not urinated. RN checked bladder scan- pt retaining 654mL. Notified MD- will in and out cath pt.

## 2020-06-15 NOTE — ED Notes (Signed)
Pt taken to MRI  

## 2020-06-15 NOTE — Telephone Encounter (Signed)
Pt's son called to report he believes pt is having a TIA, he will call other sibling and have pt taken to ED this is FYI for Dr Jaynee Eagles, he has not asked to be called back.

## 2020-06-15 NOTE — Telephone Encounter (Signed)
I called to encourage them to call 911 and not drive patient and he verbalized appreciation and stated they were currently on the phone with 911.

## 2020-06-15 NOTE — ED Provider Notes (Signed)
Care assumed from Suella Broad, PA-C, at shift change, please see their notes for full documentation of patient's complaint/HPI. Briefly, pt here with complaints of generalized weakness and difficulty with word finding that occurred after a nap around 12:30 PM today; LNK 11 AM. Results so far show negative CT Head. Awaiting neuro consult. Plan is to dispo accordingly.   Physical Exam  BP (!) 156/81   Pulse (!) 56   Temp 97.6 F (36.4 C) (Oral)   Resp 19   Ht 5\' 2"  (1.575 m)   Wt 53.8 kg   SpO2 97%   BMI 21.69 kg/m   Physical Exam Vitals and nursing note reviewed.  Constitutional:      Appearance: She is not ill-appearing.  HENT:     Head: Normocephalic and atraumatic.  Eyes:     Conjunctiva/sclera: Conjunctivae normal.  Cardiovascular:     Rate and Rhythm: Normal rate and regular rhythm.  Pulmonary:     Effort: Pulmonary effort is normal.     Breath sounds: Normal breath sounds.  Skin:    General: Skin is warm and dry.     Coloration: Skin is not jaundiced.  Neurological:     Mental Status: She is alert.     ED Course/Procedures   Clinical Course as of 06/15/20 1949  Tue Jun 15, 3461  7563 87 year old female brought in by EMS from home after waking from a nap with generalized weakness and word finding difficulty.  Event lasted 20 minutes and has completely resolved.  Patient son is at bedside at this time, states patient remains at baseline.  Patient says that she feels fine and would like to go home.  Patient's son states he would prefer to take her home at this time as well although is aware a CT head is pending. Labs reviewed, no significant findings including CBC, i-STAT Chem-8 and negative alcohol.  Urinalysis and CMP pending as well as CT head. [LM]  1426 Reviewed from prior work-up, EEG was unremarkable, echo unremarkable.  Limited head MRI unremarkable, patient unable to tolerate and study was discontinued. [LM]  3903 Pending neuro consult for possible TIA. Patient  and her son would like to go home.  [LM]    Clinical Course User Index [LM] Tacy Learn, PA-C    Procedures  MDM  Discussed case with Neurologist Dr. Lorrin Goodell who recommends MRI Brain without contrast. He recommends prioritize DWI and ADC images and if unremarkable pt can be discharged home.   MRI: IMPRESSION:  Intermittently motion degraded examination, as described.    The diffusion-weighted imaging is of good quality. No evidence of  acute infarction.    Stable moderate to moderately advanced cerebral atrophy and mild  cerebral white matter chronic small vessel ischemic disease.    Comparatively mild cerebellar atrophy, also stable.    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   Given MRI is unremarkable and pt has not had anymore symptoms/symptoms have resolved will discharge home at this time with PCP follow up.   This note was prepared using Dragon voice recognition software and may include unintentional dictation errors due to the inherent limitations of voice recognition software.   Eustaquio Maize, PA-C 06/15/20 1949    Noemi Chapel, MD 06/19/20 [phone]

## 2020-06-15 NOTE — ED Triage Notes (Signed)
Pt arrived via [facility] from home for c/o aphasia and weakness upon waking from nap at 1200. Pt LKW 1100 today. Per EMS sx resolved at 1230 when EMS got there. Pt is A&Ox2 to self and place. Pt is sinus brady on monitor. VSS

## 2020-06-15 NOTE — ED Provider Notes (Signed)
[facility] EMERGENCY DEPARTMENT Provider Note   CSN: [id number] Arrival date & time: 06/15/20  1306     History Chief Complaint  Patient presents with  . stroke sx    Brenda Lynch is a 87 y.o. female.  87 year old female with history of dementia, CKD, DM, diastolic CHF, admitted to the hospital 3/23-3/25 for TIA work up, brought in by EMS from home. Patient lives with her son, seen well at [facility], woke up from a nap at noon with weakness and word finding difficulty, was back to baseline on EMS arrival 20 minutes later. Patient denies any complaints at this time.         Past Medical History:  Diagnosis Date  . Allergic rhinitis   . Chronic diastolic CHF (congestive heart failure) ([place])   . CKD (chronic kidney disease), stage II   . Coronary artery disease   . Diabetes mellitus   . Diastolic dysfunction   . Diverticulosis   . Drug-induced bradycardia 01/28/2016  . Hypercholesterolemia   . Hypertension   . Hyponatremia   . Meningioma ([place])   . Osteoarthritis   . Sedentary lifestyle   . Spinal stenosis   . Vertigo, benign positional     Patient Active Problem List   Diagnosis Date Noted  . TIA (transient ischemic attack) 06/09/2020  . Type 2 diabetes mellitus with hyperlipidemia ([place]) 06/09/2020  . Dementia without behavioral disturbance ([place]) 06/09/2020  . Presbycusis of both ears 09/14/2016  . Drug-induced bradycardia 01/28/2016  . Cephalalgia 09/16/2015  . Orthostatic dizziness 09/16/2015  . Post-nasal drainage 09/16/2015  . SOB (shortness of breath) 07/29/2013  . Hypertension   . Diastolic dysfunction   . Hypercholesterolemia   . Diabetes mellitus   . Chronic diastolic CHF (congestive heart failure) ([place])     Past Surgical History:  Procedure Laterality Date  . BACK SURGERY    . BREAST BIOPSY    . SKIN FULL THICKNESS GRAFT Right 11/08/2017   Procedure: RIGHT SMALL FINGER ABLATION OF NAIL ANDSKIN GRAFT FULL THICKNESS;  Surgeon:  Leanora Cover, MD;  Location: [facility];  Service: Orthopedics;  Laterality: Right;  . TONSILLECTOMY       OB History   No obstetric history on file.     Family History  Problem Relation Age of Onset  . Heart disease Mother   . Heart attack Mother   . Prostate cancer Father   . Lung cancer Brother   . Pancreatic cancer Brother   . Headache Neg Hx     Social History   Tobacco Use  . Smoking status: Former Smoker    Quit date: 01/30/1979    Years since quitting: 41.4  . Smokeless tobacco: Never Used  Vaping Use  . Vaping Use: Never used  Substance Use Topics  . Alcohol use: No  . Drug use: No    Home Medications Prior to Admission medications   Medication Sig Start Date End Date Taking? Authorizing Provider  acetaminophen (TYLENOL) 500 MG tablet Take 500 mg by mouth every 6 (six) hours as needed (for headaches or mild pain).    [provider]  aspirin 325 MG tablet Take 1 tablet (325 mg total) by mouth daily. 06/12/20   Hosie Poisson, MD  atorvastatin (LIPITOR) 20 MG tablet Take 1 tablet (20 mg total) by mouth daily. 06/12/20   Hosie Poisson, MD  calcium carbonate (TUMS - DOSED IN MG ELEMENTAL CALCIUM) 500 MG chewable tablet Chew 1 tablet  by mouth daily as needed for indigestion or heartburn.    [provider]  calcium citrate-vitamin D (CITRACAL+D) 315-200 MG-UNIT per tablet Take 1 tablet by mouth daily.    [provider]  cetirizine (ZYRTEC) 10 MG tablet Take 10 mg by mouth at bedtime as needed for allergies or rhinitis.     [provider]  Chlorphen-Phenyleph-APAP (CORICIDIN D COLD/FLU/SINUS) 2-5-325 MG TABS Take 1 capsule by mouth every 6 (six) hours as needed (for symptoms).    [provider]  Cholecalciferol (VITAMIN D3) 25 MCG (1000 UT) CAPS Take 1,000 Units by mouth daily.    [provider]  clopidogrel (PLAVIX) 75 MG tablet Take 1 tablet (75 mg total) by mouth daily. 06/12/20   Hosie Poisson, MD   clotrimazole (LOTRIMIN) 1 % cream Apply to both feet and between toes twice daily Patient taking differently: Apply 1 application topically See admin instructions. Apply to both feet and between toes twice daily 02/05/18   Marzetta Board, DPM  diphenhydrAMINE (BENADRYL) 25 MG tablet Take 25 mg by mouth every 6 (six) hours as needed for allergies.    [provider]  estradiol (ESTRACE) 0.1 MG/GM vaginal cream Place 1 Applicatorful vaginally 2 (two) times a week. Monday and Friday     [provider]  fluticasone (FLONASE) 50 MCG/ACT nasal spray Place 1-2 sprays into both nostrils daily as needed for allergies or rhinitis.    [provider]  hydrochlorothiazide (MICROZIDE) 12.5 MG capsule TAKE (1) CAPSULE DAILY. Patient taking differently: Take 12.5 mg by mouth daily. 05/25/20   Sueanne Margarita, MD  hydrocortisone (ANUSOL-HC) 2.5 % rectal cream Place 1 application rectally 2 (two) times daily as needed for hemorrhoids or anal itching.    [provider]  hydrocortisone (ANUSOL-HC) 25 MG suppository Place 25 mg rectally 2 (two) times daily as needed for hemorrhoids or itching.     [provider]  hydrOXYzine (ATARAX/VISTARIL) 25 MG tablet Take 25 mg by mouth at bedtime.    [provider]  Lancets [facility] ULTRASOFT) lancets 1 each by Other route as directed.  05/01/14   [provider]  Misc Natural Products (OSTEO BI-FLEX ADV DOUBLE ST PO) Take 1 tablet by mouth daily.    [provider]  montelukast (SINGULAIR) 10 MG tablet Take 10 mg by mouth at bedtime. 12/12/12   [provider]  ONE TOUCH ULTRA TEST test strip 1 each by Other route as directed.  06/21/14   [provider]  Polyethyl Glyc-Propyl Glyc PF (SYSTANE HYDRATION PF) 0.4-0.3 % SOLN Place 1 drop into both eyes 3 (three) times daily as needed (for irritation).    [provider]  polyethylene glycol powder (GLYCOLAX/MIRALAX) 17 GM/SCOOP  powder Take 8.5-17 g by mouth daily as needed for mild constipation (MIX AND DRINK).    [provider]  potassium chloride (MICRO-K) 10 MEQ CR capsule Take 10 mEq by mouth in the morning.  07/17/14   [provider]  PRILOSEC OTC 20 MG tablet Take 20 mg by mouth daily before breakfast.    [provider]  Probiotic Product (ALIGN) 4 MG CAPS Take 4 mg by mouth daily as needed (to support G.I. health).    [provider]  ramipril (ALTACE) 10 MG capsule Take 10 mg by mouth in the morning.  10/06/19   [provider]  Saline (SIMPLY SALINE) 0.9 % AERS Place 1 spray into both nostrils as needed (for congestion).  [provider]  senna (SENOKOT) 8.6 MG TABS tablet Take 1 tablet by mouth at bedtime as needed for mild constipation.     [provider]  traZODone (DESYREL) 50 MG tablet Take 25 mg by mouth at bedtime as needed for sleep. 09/08/19   [provider]    Allergies    Ciprofloxacin, Penicillins, and Penicillin g  Review of Systems   Review of Systems  Unable to perform ROS: Dementia  Constitutional: Negative for fever.  Eyes: Negative for visual disturbance.  Respiratory: Negative for shortness of breath.   Cardiovascular: Negative for chest pain.  Gastrointestinal: Negative for abdominal pain.  Musculoskeletal: Negative for arthralgias and myalgias.  Skin: Negative for wound.  Allergic/Immunologic: Positive for immunocompromised state.  Neurological: Positive for speech difficulty and weakness.  Psychiatric/Behavioral: Negative for confusion.    Physical Exam Updated Vital Signs BP (!) 156/81   Pulse (!) 56   Temp 97.6 F (36.4 C) (Oral)   Resp 19   Ht 5\' 2"  (1.575 m)   Wt 53.8 kg   SpO2 97%   BMI 21.69 kg/m   Physical Exam Vitals and nursing note reviewed.  Constitutional:      General: She is not in acute distress.    Appearance: She is well-developed. She is not diaphoretic.  HENT:     Head:  Normocephalic and atraumatic.     Mouth/Throat:     Mouth: Mucous membranes are moist.     Pharynx: No oropharyngeal exudate or posterior oropharyngeal erythema.  Eyes:     Extraocular Movements: Extraocular movements intact.     Pupils: Pupils are equal, round, and reactive to light.  Cardiovascular:     Rate and Rhythm: Normal rate and regular rhythm.     Pulses: Normal pulses.     Heart sounds: Normal heart sounds.  Pulmonary:     Effort: Pulmonary effort is normal.     Breath sounds: Normal breath sounds.  Chest:     Chest wall: No tenderness.  Abdominal:     General: There is no distension.     Palpations: Abdomen is soft.     Tenderness: There is no abdominal tenderness.  Musculoskeletal:     Right lower leg: No edema.     Left lower leg: No edema.  Skin:    General: Skin is warm and dry.     Findings: No erythema or rash.  Neurological:     Mental Status: She is alert.     Motor: No weakness.     Comments: Alert to person and events  Psychiatric:        Behavior: Behavior normal.     ED Results / Procedures / Treatments   Labs (all labs ordered are listed, but only abnormal results are displayed) Labs Reviewed  COMPREHENSIVE METABOLIC PANEL - Abnormal; Notable for the following components:      Result Value   Sodium 131 (*)    Creatinine, Ser 1.10 (*)    Calcium 8.8 (*)    Total Protein 6.2 (*)    Albumin 3.3 (*)    GFR, Estimated 47 (*)    All other components within normal limits  I-STAT CHEM 8, ED - Abnormal; Notable for the following components:   Sodium 132 (*)    Creatinine, Ser 1.20 (*)    Calcium, Ion 1.06 (*)    All other components within normal limits  ETHANOL  PROTIME-INR  APTT  CBC  DIFFERENTIAL  RAPID URINE DRUG SCREEN,  HOSP PERFORMED  URINALYSIS, ROUTINE W REFLEX MICROSCOPIC    EKG None  Radiology CT HEAD WO CONTRAST  Result Date: 06/15/2020 CLINICAL DATA:  Transient ischemic attack, aphasia, weakness EXAM: CT HEAD WITHOUT  CONTRAST TECHNIQUE: Contiguous axial images were obtained from the base of the skull through the vertex without intravenous contrast. COMPARISON:  06/09/2020 FINDINGS: Brain: Generalized atrophy. Normal ventricular morphology. No midline shift or mass effect. Small vessel chronic ischemic changes of deep cerebral white matter. Calcified mass at the anterior falx question meningioma, 14 x 16 x 15 mm unchanged. No intracranial hemorrhage, additional mass lesion, or evidence of acute infarction. No extra-axial fluid collections. Vascular: Atherosclerotic calcification of internal carotid arteries bilaterally at skull base. No hyperdense vessels. Skull: Intact.  Hyperostosis frontalis interna. Sinuses/Orbits: Chronic partial opacification of sphenoid sinus. Remaining paranasal sinuses and mastoid air cells clear Other: N/A IMPRESSION: Atrophy with small vessel chronic ischemic changes of deep cerebral white matter. Stable calcified meningioma at the anterior falx. No acute intracranial abnormalities. Electronically Signed   By: Lavonia Dana M.D.   On: 06/15/2020 14:30    Procedures Procedures   Medications Ordered in ED Medications - No data to display  ED Course  I have reviewed the triage vital signs and the nursing notes.  Pertinent labs & imaging results that were available during my care of the patient were reviewed by me and considered in my medical decision making (see chart for details).  Clinical Course as of 06/15/20 1522  Tue Jun 16, 3531  [id number] 87 year old female brought in by EMS from home after waking from a nap with generalized weakness and word finding difficulty.  Event lasted 20 minutes and has completely resolved.  Patient son is at bedside at this time, states patient remains at baseline.  Patient says that she feels fine and would like to go home.  Patient's son states he would prefer to take her home at this time as well although is aware a CT head is pending. Labs reviewed, no  significant findings including CBC, i-STAT Chem-8 and negative alcohol.  Urinalysis and CMP pending as well as CT head. [LM]  1426 Reviewed from prior work-up, EEG was unremarkable, echo unremarkable.  Limited head MRI unremarkable, patient unable to tolerate and study was discontinued. [LM]  5956 Pending neuro consult for possible TIA. Patient and her son would like to go home.  [LM]    Clinical Course User Index [LM] Roque Lias   MDM Rules/Calculators/A&P                          Final Clinical Impression(s) / ED Diagnoses Final diagnoses:  None    Rx / DC Orders ED Discharge Orders    None       Tacy Learn, PA-C 06/15/20 1523    Sherwood Gambler, MD 06/16/20 [phone]

## 2020-06-15 NOTE — Discharge Instructions (Addendum)
Your MRI did not show an acute stroke today however it is suspected that your symptoms were related to a TIA ("mini stroke").  Please follow up with the neurologist regarding your ED visit today Return to the ED for any worsening symptoms

## 2020-06-15 NOTE — ED Notes (Signed)
Pt remains in MRI 

## 2020-06-15 NOTE — ED Notes (Signed)
Patient transported to CT 

## 2020-06-15 NOTE — ED Notes (Signed)
Pt d/c home with son, reviewed discharge paperwork, no further questions at this time.

## 2020-06-16 NOTE — Telephone Encounter (Signed)
Pt's son, Sohana Austell (on [place]) called, want to discuss her state of health and plan of care going forward. Would like a call from the nurse.

## 2020-06-17 NOTE — Telephone Encounter (Signed)
Pt's son has called stating he would like a call to discuss results of Tuesday's MRI and what steps need to be put in place as it results in the care of the pt. Please call the son Fred(on [facility]).  Son is aware there is a response time allowed of 24-48 hours

## 2020-06-17 NOTE — Telephone Encounter (Signed)
Spoke with the son and we recapped that the patient had gone to the hospital with TIA symptoms and MRI did not show anything acute, patient returned to baseline, and they elected to take her home.  I let him know that I have spoken with [name] and she advised the patient will need an appointment to discuss in more detail.  I let him know that it would either be with the stroke nurse practitioner or [name] and that I would call him Monday morning and we would get her scheduled appropriately.  He was very appreciative for the call back and is agreeable to this plan.

## 2020-06-21 ENCOUNTER — Encounter: Payer: Self-pay | Admitting: Podiatry

## 2020-06-21 ENCOUNTER — Ambulatory Visit: Payer: Medicare PPO | Admitting: Podiatry

## 2020-06-21 ENCOUNTER — Other Ambulatory Visit: Payer: Self-pay

## 2020-06-21 DIAGNOSIS — M79674 Pain in right toe(s): Secondary | ICD-10-CM

## 2020-06-21 DIAGNOSIS — E1169 Type 2 diabetes mellitus with other specified complication: Secondary | ICD-10-CM

## 2020-06-21 DIAGNOSIS — B351 Tinea unguium: Secondary | ICD-10-CM | POA: Diagnosis not present

## 2020-06-21 DIAGNOSIS — M79675 Pain in left toe(s): Secondary | ICD-10-CM | POA: Diagnosis not present

## 2020-06-21 DIAGNOSIS — E785 Hyperlipidemia, unspecified: Secondary | ICD-10-CM

## 2020-06-21 NOTE — Telephone Encounter (Signed)
I have seen her for episodes of TIA in the past, she can follow upw ith Janett Billow.

## 2020-06-22 ENCOUNTER — Ambulatory Visit: Payer: Medicare PPO | Admitting: Adult Health

## 2020-06-22 ENCOUNTER — Encounter: Payer: Self-pay | Admitting: Adult Health

## 2020-06-22 VITALS — BP 132/88 | HR 70 | Ht 65.0 in | Wt 122.0 lb

## 2020-06-22 DIAGNOSIS — R29818 Other symptoms and signs involving the nervous system: Secondary | ICD-10-CM

## 2020-06-22 NOTE — Progress Notes (Signed)
I agree with the above plan 

## 2020-06-22 NOTE — Progress Notes (Signed)
[facility] [address] [place] [address] [phone]       OFFICE FOLLOW UP NOTE  Ms. OMYA WINFIELD Date of Birth:  [birth year] Medical Record Number:  [id number]   Reason for Referral:  Recent TIA follow up    SUBJECTIVE:   CHIEF COMPLAINT:  Chief Complaint  Patient presents with  . Follow-up    RM 14 with son (fred) Pt is well, no symptoms after naps anymore, no headache recently, no weakness per son. No physical complications that he has aware of.     HPI:   Ashelyn B Ewell is a 87 yo femalewith a PMHx of CdHF, CKD II, CAD, DM II, HLD, chronic menigioma, cognitive disorder, HB intermittently during sleep on long term cardiac monitoring 2021, OA, BPV, and HTN who presented to [facility] ED on 06/09/2020 as a Code Stroke per EMS due to aphasia at home - also noted she cried out, had a glazed look, right facial droop and slurred speech.  Personally reviewed hospitalization pertinent progress notes, lab work and imaging with summary provided.  Strokelike episode of "aphasia, crying out and getting a glazed look to her eyes" shortly resolved with imaging showing chronic occlusion of P1 segment with DDx possible TIA vs seizure and less likely complicated migraine.  Completed 14-day cardiac monitor 05/2019 which is negative for A. fib.  Recommended DAPT for 3 weeks and Plavix alone.  EEG negative.  No AEDs recommended.  LDL 118-initiated atorvastatin 20 mg daily.  Other stroke risk factors include advanced age, former tobacco use, CAD and CHF.  Discharged home in stable condition.  She returned on 3/29 after awakening from a nap with generalized weakness and word finding difficulty which lasted approximately 20 minutes.  MRI limited due to motion but no evidence of acute stroke and labs unremarkable. D/c'd back home after 7 hours.   She is an established patient of [name] recently seen on 02/17/2020 for morning headaches.  She was referred to [facility] sleep clinic with  scheduled consult visit with Dr. Brett Fairy on 08/02/2020.   She is accompanied by her son, Josph Macho, who assists with history due to cognitive impairment She has been stable since recent ED visit without new or recurring stroke/TIA or seizure type symptoms Denies any reoccurring headache She continues to reside with Josph Macho and is able to maintain ADLs independently Remains on aspirin and plavix -no abnormal bruising or bleeding. Hx of hemorrhoids and has evaluation with GI scheduled in June Remains on lipitor - no associated side effects Blood pressure initially elevated and on recheck 132/88- monitored at home and has been stable - typically 120-130s/70-80s  No further concerns at this time    ROS:   N/A d/t cognitive impairment  PMH:  Past Medical History:  Diagnosis Date  . Allergic rhinitis   . Chronic diastolic CHF (congestive heart failure) ([place])   . CKD (chronic kidney disease), stage II   . Coronary artery disease   . Diabetes mellitus   . Diastolic dysfunction   . Diverticulosis   . Drug-induced bradycardia 01/28/2016  . Hypercholesterolemia   . Hypertension   . Hyponatremia   . Meningioma ([place])   . Osteoarthritis   . Sedentary lifestyle   . Spinal stenosis   . Vertigo, benign positional     PSH:  Past Surgical History:  Procedure Laterality Date  . BACK SURGERY    . BREAST BIOPSY    . SKIN FULL THICKNESS GRAFT Right 11/08/2017   Procedure:  RIGHT SMALL FINGER ABLATION OF NAIL ANDSKIN GRAFT FULL THICKNESS;  Surgeon: Leanora Cover, MD;  Location: [facility];  Service: Orthopedics;  Laterality: Right;  . TONSILLECTOMY      Social History:  Social History   Socioeconomic History  . Marital status: Widowed    Spouse name: Not on file  . Number of children: Not on file  . Years of education: Not on file  . Highest education level: Not on file  Occupational History  . Occupation: retired  Tobacco Use  . Smoking status: Former Smoker    Quit date:  01/30/1979    Years since quitting: 41.4  . Smokeless tobacco: Never Used  Vaping Use  . Vaping Use: Never used  Substance and Sexual Activity  . Alcohol use: No  . Drug use: No  . Sexual activity: Not on file  Other Topics Concern  . Not on file  Social History Narrative   Lives alone with sons close by   Social Determinants of Health   Financial Resource Strain: Not on file  Food Insecurity: Not on file  Transportation Needs: Not on file  Physical Activity: Not on file  Stress: Not on file  Social Connections: Not on file  Intimate Partner Violence: Not on file    Family History:  Family History  Problem Relation Age of Onset  . Heart disease Mother   . Heart attack Mother   . Prostate cancer Father   . Lung cancer Brother   . Pancreatic cancer Brother   . Headache Neg Hx     Medications:   Current Outpatient Medications on File Prior to Visit  Medication Sig Dispense Refill  . acetaminophen (TYLENOL) 500 MG tablet Take 500 mg by mouth every 6 (six) hours as needed (for headaches or mild pain).    Marland Kitchen aspirin 325 MG tablet Take 1 tablet (325 mg total) by mouth daily. 21 tablet 0  . atorvastatin (LIPITOR) 20 MG tablet Take 1 tablet (20 mg total) by mouth daily. 30 tablet 1  . calcium carbonate (TUMS - DOSED IN MG ELEMENTAL CALCIUM) 500 MG chewable tablet Chew 1 tablet by mouth daily as needed for indigestion or heartburn.    . calcium citrate-vitamin D (CITRACAL+D) 315-200 MG-UNIT per tablet Take 1 tablet by mouth daily.    . cetirizine (ZYRTEC) 10 MG tablet Take 10 mg by mouth at bedtime as needed for allergies or rhinitis.     . Chlorphen-Phenyleph-APAP (CORICIDIN D COLD/FLU/SINUS) 2-5-325 MG TABS Take 1 capsule by mouth every 6 (six) hours as needed (for symptoms).    . Cholecalciferol (VITAMIN D3) 25 MCG (1000 UT) CAPS Take 1,000 Units by mouth daily.    . clopidogrel (PLAVIX) 75 MG tablet Take 1 tablet (75 mg total) by mouth daily. 30 tablet 3  . clotrimazole  (LOTRIMIN) 1 % cream Apply to both feet and between toes twice daily (Patient taking differently: Apply 1 application topically See admin instructions. Apply to both feet and between toes twice daily) 30 g 1  . diphenhydrAMINE (BENADRYL) 25 MG tablet Take 25 mg by mouth every 6 (six) hours as needed for allergies.    Marland Kitchen estradiol (ESTRACE) 0.1 MG/GM vaginal cream Place 1 Applicatorful vaginally 2 (two) times a week. Monday and Friday     . fluticasone (FLONASE) 50 MCG/ACT nasal spray Place 1-2 sprays into both nostrils daily as needed for allergies or rhinitis.    . hydrochlorothiazide (MICROZIDE) 12.5 MG capsule TAKE (1) CAPSULE DAILY. (Patient  taking differently: Take 12.5 mg by mouth daily.) 30 capsule 11  . hydrocortisone (ANUSOL-HC) 2.5 % rectal cream Place 1 application rectally 2 (two) times daily as needed for hemorrhoids or anal itching.    . hydrocortisone (ANUSOL-HC) 25 MG suppository Place 25 mg rectally 2 (two) times daily as needed for hemorrhoids or itching.     . hydrOXYzine (ATARAX/VISTARIL) 25 MG tablet Take 25 mg by mouth at bedtime.    . Lancets (ONETOUCH ULTRASOFT) lancets 1 each by Other route as directed.     . Misc Natural Products (OSTEO BI-FLEX ADV DOUBLE ST PO) Take 1 tablet by mouth daily.    . montelukast (SINGULAIR) 10 MG tablet Take 10 mg by mouth at bedtime.    . ONE TOUCH ULTRA TEST test strip 1 each by Other route as directed.     Vladimir Faster Glyc-Propyl Glyc PF (SYSTANE HYDRATION PF) 0.4-0.3 % SOLN Place 1 drop into both eyes 3 (three) times daily as needed (for irritation).    . polyethylene glycol powder (GLYCOLAX/MIRALAX) 17 GM/SCOOP powder Take 8.5-17 g by mouth daily as needed for mild constipation (MIX AND DRINK).    . potassium chloride (MICRO-K) 10 MEQ CR capsule Take 10 mEq by mouth in the morning.     Marland Kitchen PRILOSEC OTC 20 MG tablet Take 20 mg by mouth daily before breakfast.    . Probiotic Product (ALIGN) 4 MG CAPS Take 4 mg by mouth daily as needed (to  support G.I. health).    . ramipril (ALTACE) 10 MG capsule Take 10 mg by mouth in the morning.     . Saline (SIMPLY SALINE) 0.9 % AERS Place 1 spray into both nostrils as needed (for congestion).    Marland Kitchen senna (SENOKOT) 8.6 MG TABS tablet Take 1 tablet by mouth at bedtime as needed for mild constipation.     . traZODone (DESYREL) 50 MG tablet Take 25 mg by mouth at bedtime as needed for sleep.     No current facility-administered medications on file prior to visit.    Allergies:   Allergies  Allergen Reactions  . Ciprofloxacin Nausea Only and Other (See Comments)    GI intolerance  . Doxycycline Hyclate     Other reaction(s): BS elev, Head pressure  . Penicillins Hives and Other (See Comments)    Welts, also Has patient had a PCN reaction causing immediate rash, facial/tongue/throat swelling, SOB or lightheadedness with hypotension: Yes Has patient had a PCN reaction causing severe rash involving mucus membranes or skin necrosis: No Has patient had a PCN reaction that required hospitalization No Has patient had a PCN reaction occurring within the last 10 years: No If all of the above answers are "NO", then may proceed with Cephalosporin use.  Marland Kitchen Penicillin G Hives and Other (See Comments)    Welts on the body      OBJECTIVE:  Physical Exam  Vitals:   06/22/20 1539  BP: 132/88  Pulse: 70  Weight: 122 lb (55.3 kg)  Height: 5\' 5"  (1.651 m)   Body mass index is 20.3 kg/m. No exam data present  General: well developed, well nourished,  very pleasant elderly African-American female, seated, in no evident distress Head: head normocephalic and atraumatic.   Neck: supple with no carotid or supraclavicular bruits Cardiovascular: regular rate and rhythm, no murmurs Musculoskeletal: no deformity Skin:  no rash/petichiae Vascular:  Normal pulses all extremities   Neurologic Exam Mental Status: Awake and fully alert.   No evidence of aphasia.  Mild slurring of words but per son, due  to movement of her lower dentures.  Disoriented to time but oriented to self and place. Recent memory impaired and remote memory intact. Attention span, concentration and fund of knowledge diminished. Mood and affect appropriate and cooperative with exam.  Cranial Nerves: Pupils equal, briskly reactive to light. Extraocular movements full without nystagmus. Visual fields full to confrontation. Hearing intact. Facial sensation intact. Face, tongue, palate moves normally and symmetrically.  Motor: Normal bulk and tone. Normal strength in all tested extremity muscles Sensory.: intact to touch , pinprick , position and vibratory sensation.  Coordination: Rapid alternating movements normal in all extremities. Finger-to-nose and heel-to-shin performed accurately bilaterally. Gait and Station: Arises from chair without difficulty. Stance is normal.  Gait abnormality with decreased stride length and step height with mild unsteadiness and use of cane Reflexes: 1+ and symmetric. Toes downgoing.     NIHSS  0 Modified Rankin  0      ASSESSMENT: Araly B Kueker is a 87 y.o. year old female presented with transient aphasia, slurred speech, right facial droop, crying out and glazed eyes on 06/09/2020 and transient aphasia and generalized weakness upon awakening from a nap on 06/15/2020 with unclear etiology, concerning for TIA vs seizures. Vascular risk factors include HTN, HLD, DM, dementia, diastolic CHF, CAD, advanced age and intracranial stenosis.      PLAN:  1. Transient neurological events:  a. Unknown etiology - ?TIA vs seizure b. Encourage continued stroke prevention with ongoing use of aspirin and Plavix and atorvastatin 20 mg daily.  Advised to continue aspirin for total of 3-week duration then Plavix alone.  Discussed secondary stroke prevention measures and importance of close PCP follow-up for aggressive stroke risk factor management with PCP w/u in the next 1 to 2 months for repeat lipid panel  and prescribing of above medications c. EEG 06/10/2020 normal - no hx of seizures - AED not recommended currently. May consider in the future if additional episodes occur 2. Suspected sleep apnea: scheduled evaluation with Dr. Brett Fairy on 08/02/2020. Due to baseline dementia and increased confusion with change in environment, son requests HST as she would likely had great difficulty to in lab sleep study    Follow up in 4 months or call earlier if needed   CC:  [place] provider: Dr. Leonie Man PCP: [name], MD    I spent 45 minutes of face-to-face and non-face-to-face time with patient and son.  This included previsit chart review including recent hospitalizations pertinent progress notes, lab work and imaging, lab review, study review, order entry, electronic health record documentation, patient education regarding recent transient events and possible etiologies, importance of managing stroke risk factors and answered all other questions to patient and sons satisfaction  Frann Rider, AGNP-BC  [facility] [address] [place], [place] [address]  Phone [phone] Fax [phone] Note: This document was prepared with digital dictation and possible smart phrase technology. Any transcriptional errors that result from this process are unintentional.

## 2020-06-22 NOTE — Patient Instructions (Addendum)
Continue aspirin 325 mg daily and clopidogrel 75 mg daily  and atorvastatin  for secondary stroke prevention  Stop aspirin after 2 additional weeks then just plavix alone  Continue to follow up with PCP regarding cholesterol and blood pressure management  Maintain strict control of hypertension with blood pressure goal below 130/90 and cholesterol with LDL cholesterol (bad cholesterol) goal below 70 mg/dL.   Follow up with Dr. Brett Fairy in May for evaluation of sleep apnea      Followup in the future with me in 4 months or call earlier if needed       Thank you for coming to see [place] at [facility]. I hope we have been able to provide you high quality care today.  You may receive a patient satisfaction survey over the next few weeks. We would appreciate your feedback and comments so that we may continue to improve ourselves and the health of our patients.      Transient Ischemic Attack A transient ischemic attack (TIA) causes the same symptoms as a stroke, but the symptoms go away quickly. A TIA happens when blood flow to the brain is blocked. Having a TIA means you may be at risk for a stroke. A TIA is a medical emergency. What are the causes? A TIA is caused by a blocked artery in the head or neck. This means the brain does not get the blood supply it needs. A blockage can be caused by:  Fatty buildup in an artery in the head or neck.  A blood clot.  A tear in an artery.  Irritation and swelling (inflammation) of an artery. Sometimes the cause is not known. What increases the risk? Certain things may make you more likely to have a TIA. Some of these are things that you can change, such as:  Being very overweight.  Using products that have nicotine or tobacco.  Taking birth control pills.  Not being active.  Drinking too much alcohol.  Using drugs. Health conditions that may increase your risk include:  High blood pressure.  High  cholesterol.  Diabetes.  Heart disease.  A heartbeat that is not regular (atrial fibrillation).  Sickle cell disease.  Sleep problems (sleep apnea).  Long-term diseases that cause irritation and swelling.  Problems with blood clotting. Other risk factors include:  Being over the age of 71.  Being female.  Having a family history of stroke.  Having had blood clots, stroke, TIA, or heart attack in the past.  Having a history of high blood pressure when pregnant (preeclampsia).  Very bad headaches (migraines). What are the signs or symptoms? The symptoms of a TIA are like those of a stroke. They can include:  Weakness or loss of feeling in your face, arm, or leg. This often happens on one side of your body.  Trouble walking.  Trouble moving your arms or legs.  Trouble talking or understanding what people are saying.  Problems with how you see.  Feeling dizzy.  Feeling confused.  Loss of balance or coordination.  Feeling like you may vomit (nausea) or you vomit.  Having a very bad headache. If you can, note what time you started to have symptoms. Tell your doctor.   How is this treated? The goal of treatment is to lower the risk for a stroke. This may include:  Changes to diet and lifestyle, such as getting regular exercise and stopping smoking.  Taking medicines to: ? Thin the blood. ? Lower blood pressure. ?  Lower cholesterol.  Treating other health conditions, such as diabetes. If testing shows that an artery in your brain is narrow, your doctor may recommend a procedure to:  Take the blockage out of your artery (carotid endarterectomy).  Open or widen an artery in your neck (carotid angioplasty and stenting). Follow these instructions at home: Medicines  Take over-the-counter and prescription medicines only as told by your doctor.  If you were told to take aspirin or another medicine to thin your blood, take it exactly as told by your  doctor. ? Taking too much of the medicine can cause bleeding. ? Taking too little of the medicine may not work to treat the problem. Eating and drinking  Eat 5 or more servings of fruits and vegetables each day.  Follow instructions from your doctor about your diet. You may need to follow a certain diet to help lower your risk of a stroke. You may need to: ? Eat a diet that is low in fat and salt. ? Eat foods with a lot of fiber. ? Limit carbohydrates and sugar.  If you drink alcohol: ? Limit how much you have to:  0-1 drink a day for women who are not pregnant.  0-2 drinks a day for men. ? Know how much alcohol is in a drink. In the [place] one drink equals one 12 oz bottle of beer (344mL), one 5 oz glass of wine (175mL), or one 1 oz glass of hard liquor (41mL).   General instructions  Keep a healthy weight.  Try to get at least 30 minutes of exercise on most days.  Get treatment if you have sleep problems.  Do not smoke or use any products that contain nicotine or tobacco. If you need help quitting, ask your doctor.  Do not use drugs.  Keep all follow-up visits. Where to find more information  American Stroke Association: www.stroke.org Get help right away if:  You have chest pain.  You have a heartbeat that is not regular.  You have any signs of a stroke. "BE FAST" is an easy way to remember the main warning signs: ? B - Balance. Dizziness, sudden trouble walking, or loss of balance. ? E - Eyes. Trouble seeing or a change in how you see. ? F - Face. Sudden weakness or loss of feeling of the face. The face or eyelid may droop on one side. ? A - Arms. Weakness or loss of feeling in an arm. This happens all of a sudden and most often on one side of the body. ? S - Speech. Sudden trouble speaking, slurred speech, or trouble understanding what people say. ? T - Time. Time to call emergency services. Write down what time symptoms started.  You have other signs of a  stroke, such as: ? A sudden, very bad headache with no known cause. ? Feeling like you may vomit. ? Vomiting. ? A seizure. These symptoms may be an emergency. Get help right away. Call your local emergency services (911 in the [place]  Do not wait to see if the symptoms will go away.  Do not drive yourself to the hospital. Summary  A transient ischemic attack (TIA) happens when an artery in the head or neck is blocked. This causes the same symptoms as a stroke. The symptoms go away quickly.  A TIA is a medical emergency. Get help right away, even if your symptoms go away.  Having a TIA means that you may be at risk for a stroke.  Taking medicines and making diet and lifestyle changes can help to prevent a stroke. This information is not intended to replace advice given to you by your health care provider. Make sure you discuss any questions you have with your health care provider. Document Revised: 09/30/2019 Document Reviewed: 09/30/2019 Elsevier Patient Education  [place].

## 2020-06-23 ENCOUNTER — Telehealth: Payer: Self-pay | Admitting: Adult Health

## 2020-06-23 ENCOUNTER — Encounter: Payer: Self-pay | Admitting: Adult Health

## 2020-06-23 DIAGNOSIS — I69392 Facial weakness following cerebral infarction: Secondary | ICD-10-CM | POA: Diagnosis not present

## 2020-06-23 DIAGNOSIS — M47812 Spondylosis without myelopathy or radiculopathy, cervical region: Secondary | ICD-10-CM | POA: Diagnosis not present

## 2020-06-23 DIAGNOSIS — N183 Chronic kidney disease, stage 3 unspecified: Secondary | ICD-10-CM | POA: Diagnosis not present

## 2020-06-23 DIAGNOSIS — I130 Hypertensive heart and chronic kidney disease with heart failure and stage 1 through stage 4 chronic kidney disease, or unspecified chronic kidney disease: Secondary | ICD-10-CM | POA: Diagnosis not present

## 2020-06-23 DIAGNOSIS — G40909 Epilepsy, unspecified, not intractable, without status epilepticus: Secondary | ICD-10-CM | POA: Diagnosis not present

## 2020-06-23 DIAGNOSIS — I5032 Chronic diastolic (congestive) heart failure: Secondary | ICD-10-CM | POA: Diagnosis not present

## 2020-06-23 DIAGNOSIS — E1122 Type 2 diabetes mellitus with diabetic chronic kidney disease: Secondary | ICD-10-CM | POA: Diagnosis not present

## 2020-06-23 DIAGNOSIS — F0280 Dementia in other diseases classified elsewhere without behavioral disturbance: Secondary | ICD-10-CM | POA: Diagnosis not present

## 2020-06-23 DIAGNOSIS — I69320 Aphasia following cerebral infarction: Secondary | ICD-10-CM | POA: Diagnosis not present

## 2020-06-23 NOTE — Telephone Encounter (Signed)
As this is in telephone note, will be recorded in her chart. Thank you

## 2020-06-23 NOTE — Telephone Encounter (Signed)
I called son, fred who was with mother yesterday but failed to mention concerning ADL's for pt.  He prepares/cooks meals for his mom, she does not do this, also he lays out her clothes (sequencing them for her), and do not feel she can be left alone in house (w/o supervision) concerning her TIA hx.  Wanted to convey this relating to possible care that may be needed in future but also (LTC policy requirements).  He may also relay this information in mychart portal.

## 2020-06-23 NOTE — Telephone Encounter (Signed)
Pt's son(on DPR) is asking for a call with respect to the appointment on yesterday he has additional information to give RN

## 2020-06-24 DIAGNOSIS — G459 Transient cerebral ischemic attack, unspecified: Secondary | ICD-10-CM | POA: Diagnosis not present

## 2020-06-24 DIAGNOSIS — E46 Unspecified protein-calorie malnutrition: Secondary | ICD-10-CM | POA: Diagnosis not present

## 2020-06-27 NOTE — Progress Notes (Signed)
  Subjective:  Patient ID: Brenda Lynch, female    DOB: 1924-01-14,  MRN: [id number]  Brenda Lynch presents to clinic today for preventative diabetic foot care and painful thick toenails that are difficult to trim. Pain interferes with ambulation. Aggravating factors include wearing enclosed shoe gear. Pain is relieved with periodic professional debridement.   PCP is Dr. Wenda Lynch and last visit was 06/11/2020. Her son is present during today's visit.  She voices no new pedal problems on today's visit. She was seen in ED for suspected TIA on 06/15/2020.  Review of Systems: Negative except as noted in the HPI.  Allergies  Allergen Reactions  . Ciprofloxacin Nausea Only and Other (See Comments)    GI intolerance  . Doxycycline Hyclate     Other reaction(s): BS elev, Head pressure  . Penicillins Hives and Other (See Comments)    Welts, also Has patient had a PCN reaction causing immediate rash, facial/tongue/throat swelling, SOB or lightheadedness with hypotension: Yes Has patient had a PCN reaction causing severe rash involving mucus membranes or skin necrosis: No Has patient had a PCN reaction that required hospitalization No Has patient had a PCN reaction occurring within the last 10 years: No If all of the above answers are "NO", then may proceed with Cephalosporin use.  Marland Kitchen Penicillin G Hives and Other (See Comments)    Welts on the body   Objective:   Constitutional Brenda Lynch is a pleasant 87 y.o. African American female, in NAD.Marland Kitchen AAO x 3.   Vascular Dorsalis pedis pulses palpable bilaterally. Posterior tibial pulses palpable bilaterally.Capillary refill normal to all digits. No cyanosis or clubbing noted. Pedal hair growth sparse b/l.  Neurologic Normal speech. Oriented to person, place, and time. Epicritic sensation to light touch grossly present bilaterally.  Dermatologic Pedal skin with normal turgor, texture and tone b/l.  Toenails discolored, thick, elongated,  dystrophic with pain on palpation x 10. No open wounds. No skin lesions.  Orthopedic: Normal joint ROM without pain or crepitus bilaterally. No visible deformities. No bony tenderness.   Radiographs: None Assessment:   1. Pain due to onychomycosis of toenails of both feet   2. Type 2 diabetes mellitus with hyperlipidemia ([place])    Plan:  Patient was evaluated and treated and all questions answered.  Onychomycosis with pain -Nails palliatively debridement as below -Educated on self-care  Procedure: Nail Debridement Rationale: Pain Type of Debridement: manual, sharp debridement. Instrumentation: Nail nipper, rotary burr. Number of Nails: 10  -Continue diabetic foot care principles. -Toenails 1-5 b/l were debrided in length and girth with sterile nail nippers and dremel without iatrogenic bleeding.  -Patient to report any pedal injuries to medical professional immediately. -Patient to continue soft, supportive shoe gear daily. -Patient/POA to call should there be question/concern in the interim.  Return in about 3 months (around 09/20/2020).  Brenda Lynch, DPM

## 2020-06-28 DIAGNOSIS — I69392 Facial weakness following cerebral infarction: Secondary | ICD-10-CM | POA: Diagnosis not present

## 2020-06-28 DIAGNOSIS — N183 Chronic kidney disease, stage 3 unspecified: Secondary | ICD-10-CM | POA: Diagnosis not present

## 2020-06-28 DIAGNOSIS — I5032 Chronic diastolic (congestive) heart failure: Secondary | ICD-10-CM | POA: Diagnosis not present

## 2020-06-28 DIAGNOSIS — G40909 Epilepsy, unspecified, not intractable, without status epilepticus: Secondary | ICD-10-CM | POA: Diagnosis not present

## 2020-06-28 DIAGNOSIS — E1122 Type 2 diabetes mellitus with diabetic chronic kidney disease: Secondary | ICD-10-CM | POA: Diagnosis not present

## 2020-06-28 DIAGNOSIS — F0280 Dementia in other diseases classified elsewhere without behavioral disturbance: Secondary | ICD-10-CM | POA: Diagnosis not present

## 2020-06-28 DIAGNOSIS — M47812 Spondylosis without myelopathy or radiculopathy, cervical region: Secondary | ICD-10-CM | POA: Diagnosis not present

## 2020-06-28 DIAGNOSIS — I130 Hypertensive heart and chronic kidney disease with heart failure and stage 1 through stage 4 chronic kidney disease, or unspecified chronic kidney disease: Secondary | ICD-10-CM | POA: Diagnosis not present

## 2020-06-28 DIAGNOSIS — I69320 Aphasia following cerebral infarction: Secondary | ICD-10-CM | POA: Diagnosis not present

## 2020-07-02 DIAGNOSIS — M47812 Spondylosis without myelopathy or radiculopathy, cervical region: Secondary | ICD-10-CM | POA: Diagnosis not present

## 2020-07-02 DIAGNOSIS — I130 Hypertensive heart and chronic kidney disease with heart failure and stage 1 through stage 4 chronic kidney disease, or unspecified chronic kidney disease: Secondary | ICD-10-CM | POA: Diagnosis not present

## 2020-07-02 DIAGNOSIS — E1122 Type 2 diabetes mellitus with diabetic chronic kidney disease: Secondary | ICD-10-CM | POA: Diagnosis not present

## 2020-07-02 DIAGNOSIS — I5032 Chronic diastolic (congestive) heart failure: Secondary | ICD-10-CM | POA: Diagnosis not present

## 2020-07-02 DIAGNOSIS — I69320 Aphasia following cerebral infarction: Secondary | ICD-10-CM | POA: Diagnosis not present

## 2020-07-02 DIAGNOSIS — F0280 Dementia in other diseases classified elsewhere without behavioral disturbance: Secondary | ICD-10-CM | POA: Diagnosis not present

## 2020-07-02 DIAGNOSIS — G40909 Epilepsy, unspecified, not intractable, without status epilepticus: Secondary | ICD-10-CM | POA: Diagnosis not present

## 2020-07-02 DIAGNOSIS — I69392 Facial weakness following cerebral infarction: Secondary | ICD-10-CM | POA: Diagnosis not present

## 2020-07-02 DIAGNOSIS — N183 Chronic kidney disease, stage 3 unspecified: Secondary | ICD-10-CM | POA: Diagnosis not present

## 2020-07-06 DIAGNOSIS — I5032 Chronic diastolic (congestive) heart failure: Secondary | ICD-10-CM | POA: Diagnosis not present

## 2020-07-06 DIAGNOSIS — E1122 Type 2 diabetes mellitus with diabetic chronic kidney disease: Secondary | ICD-10-CM | POA: Diagnosis not present

## 2020-07-06 DIAGNOSIS — I130 Hypertensive heart and chronic kidney disease with heart failure and stage 1 through stage 4 chronic kidney disease, or unspecified chronic kidney disease: Secondary | ICD-10-CM | POA: Diagnosis not present

## 2020-07-06 DIAGNOSIS — M47812 Spondylosis without myelopathy or radiculopathy, cervical region: Secondary | ICD-10-CM | POA: Diagnosis not present

## 2020-07-06 DIAGNOSIS — G40909 Epilepsy, unspecified, not intractable, without status epilepticus: Secondary | ICD-10-CM | POA: Diagnosis not present

## 2020-07-06 DIAGNOSIS — I69392 Facial weakness following cerebral infarction: Secondary | ICD-10-CM | POA: Diagnosis not present

## 2020-07-06 DIAGNOSIS — N183 Chronic kidney disease, stage 3 unspecified: Secondary | ICD-10-CM | POA: Diagnosis not present

## 2020-07-06 DIAGNOSIS — F0280 Dementia in other diseases classified elsewhere without behavioral disturbance: Secondary | ICD-10-CM | POA: Diagnosis not present

## 2020-07-06 DIAGNOSIS — I69320 Aphasia following cerebral infarction: Secondary | ICD-10-CM | POA: Diagnosis not present

## 2020-07-14 DIAGNOSIS — N183 Chronic kidney disease, stage 3 unspecified: Secondary | ICD-10-CM | POA: Diagnosis not present

## 2020-07-14 DIAGNOSIS — I69320 Aphasia following cerebral infarction: Secondary | ICD-10-CM | POA: Diagnosis not present

## 2020-07-14 DIAGNOSIS — E1122 Type 2 diabetes mellitus with diabetic chronic kidney disease: Secondary | ICD-10-CM | POA: Diagnosis not present

## 2020-07-14 DIAGNOSIS — I5032 Chronic diastolic (congestive) heart failure: Secondary | ICD-10-CM | POA: Diagnosis not present

## 2020-07-14 DIAGNOSIS — I69392 Facial weakness following cerebral infarction: Secondary | ICD-10-CM | POA: Diagnosis not present

## 2020-07-14 DIAGNOSIS — F0280 Dementia in other diseases classified elsewhere without behavioral disturbance: Secondary | ICD-10-CM | POA: Diagnosis not present

## 2020-07-14 DIAGNOSIS — G40909 Epilepsy, unspecified, not intractable, without status epilepticus: Secondary | ICD-10-CM | POA: Diagnosis not present

## 2020-07-14 DIAGNOSIS — I130 Hypertensive heart and chronic kidney disease with heart failure and stage 1 through stage 4 chronic kidney disease, or unspecified chronic kidney disease: Secondary | ICD-10-CM | POA: Diagnosis not present

## 2020-07-14 DIAGNOSIS — M47812 Spondylosis without myelopathy or radiculopathy, cervical region: Secondary | ICD-10-CM | POA: Diagnosis not present

## 2020-07-19 DIAGNOSIS — E1122 Type 2 diabetes mellitus with diabetic chronic kidney disease: Secondary | ICD-10-CM | POA: Diagnosis not present

## 2020-07-19 DIAGNOSIS — I69392 Facial weakness following cerebral infarction: Secondary | ICD-10-CM | POA: Diagnosis not present

## 2020-07-19 DIAGNOSIS — I5032 Chronic diastolic (congestive) heart failure: Secondary | ICD-10-CM | POA: Diagnosis not present

## 2020-07-19 DIAGNOSIS — F0280 Dementia in other diseases classified elsewhere without behavioral disturbance: Secondary | ICD-10-CM | POA: Diagnosis not present

## 2020-07-19 DIAGNOSIS — I130 Hypertensive heart and chronic kidney disease with heart failure and stage 1 through stage 4 chronic kidney disease, or unspecified chronic kidney disease: Secondary | ICD-10-CM | POA: Diagnosis not present

## 2020-07-19 DIAGNOSIS — N183 Chronic kidney disease, stage 3 unspecified: Secondary | ICD-10-CM | POA: Diagnosis not present

## 2020-07-19 DIAGNOSIS — G40909 Epilepsy, unspecified, not intractable, without status epilepticus: Secondary | ICD-10-CM | POA: Diagnosis not present

## 2020-07-19 DIAGNOSIS — I69320 Aphasia following cerebral infarction: Secondary | ICD-10-CM | POA: Diagnosis not present

## 2020-07-19 DIAGNOSIS — M47812 Spondylosis without myelopathy or radiculopathy, cervical region: Secondary | ICD-10-CM | POA: Diagnosis not present

## 2020-07-22 DIAGNOSIS — F0280 Dementia in other diseases classified elsewhere without behavioral disturbance: Secondary | ICD-10-CM | POA: Diagnosis not present

## 2020-07-22 DIAGNOSIS — E1122 Type 2 diabetes mellitus with diabetic chronic kidney disease: Secondary | ICD-10-CM | POA: Diagnosis not present

## 2020-07-22 DIAGNOSIS — I69320 Aphasia following cerebral infarction: Secondary | ICD-10-CM | POA: Diagnosis not present

## 2020-07-22 DIAGNOSIS — I130 Hypertensive heart and chronic kidney disease with heart failure and stage 1 through stage 4 chronic kidney disease, or unspecified chronic kidney disease: Secondary | ICD-10-CM | POA: Diagnosis not present

## 2020-07-22 DIAGNOSIS — M47812 Spondylosis without myelopathy or radiculopathy, cervical region: Secondary | ICD-10-CM | POA: Diagnosis not present

## 2020-07-22 DIAGNOSIS — I5032 Chronic diastolic (congestive) heart failure: Secondary | ICD-10-CM | POA: Diagnosis not present

## 2020-07-22 DIAGNOSIS — G40909 Epilepsy, unspecified, not intractable, without status epilepticus: Secondary | ICD-10-CM | POA: Diagnosis not present

## 2020-07-22 DIAGNOSIS — N183 Chronic kidney disease, stage 3 unspecified: Secondary | ICD-10-CM | POA: Diagnosis not present

## 2020-07-22 DIAGNOSIS — I69392 Facial weakness following cerebral infarction: Secondary | ICD-10-CM | POA: Diagnosis not present

## 2020-07-23 DIAGNOSIS — R197 Diarrhea, unspecified: Secondary | ICD-10-CM | POA: Diagnosis not present

## 2020-07-23 DIAGNOSIS — K219 Gastro-esophageal reflux disease without esophagitis: Secondary | ICD-10-CM | POA: Diagnosis not present

## 2020-07-23 DIAGNOSIS — R159 Full incontinence of feces: Secondary | ICD-10-CM | POA: Diagnosis not present

## 2020-07-28 DIAGNOSIS — I5032 Chronic diastolic (congestive) heart failure: Secondary | ICD-10-CM | POA: Diagnosis not present

## 2020-07-28 DIAGNOSIS — I69392 Facial weakness following cerebral infarction: Secondary | ICD-10-CM | POA: Diagnosis not present

## 2020-07-28 DIAGNOSIS — I130 Hypertensive heart and chronic kidney disease with heart failure and stage 1 through stage 4 chronic kidney disease, or unspecified chronic kidney disease: Secondary | ICD-10-CM | POA: Diagnosis not present

## 2020-07-28 DIAGNOSIS — I69320 Aphasia following cerebral infarction: Secondary | ICD-10-CM | POA: Diagnosis not present

## 2020-07-28 DIAGNOSIS — F0280 Dementia in other diseases classified elsewhere without behavioral disturbance: Secondary | ICD-10-CM | POA: Diagnosis not present

## 2020-07-28 DIAGNOSIS — G40909 Epilepsy, unspecified, not intractable, without status epilepticus: Secondary | ICD-10-CM | POA: Diagnosis not present

## 2020-07-28 DIAGNOSIS — N183 Chronic kidney disease, stage 3 unspecified: Secondary | ICD-10-CM | POA: Diagnosis not present

## 2020-07-28 DIAGNOSIS — E1122 Type 2 diabetes mellitus with diabetic chronic kidney disease: Secondary | ICD-10-CM | POA: Diagnosis not present

## 2020-07-28 DIAGNOSIS — M47812 Spondylosis without myelopathy or radiculopathy, cervical region: Secondary | ICD-10-CM | POA: Diagnosis not present

## 2020-07-31 ENCOUNTER — Emergency Department (HOSPITAL_COMMUNITY): Payer: Medicare PPO

## 2020-07-31 ENCOUNTER — Other Ambulatory Visit: Payer: Self-pay

## 2020-07-31 ENCOUNTER — Observation Stay (HOSPITAL_COMMUNITY): Payer: Medicare PPO

## 2020-07-31 ENCOUNTER — Observation Stay (HOSPITAL_COMMUNITY)
Admission: EM | Admit: 2020-07-31 | Discharge: 2020-08-02 | Disposition: A | Discharge status: Home / Self Care | Payer: Medicare PPO | Attending: Family Medicine | Admitting: Family Medicine

## 2020-07-31 DIAGNOSIS — J3489 Other specified disorders of nose and nasal sinuses: Secondary | ICD-10-CM | POA: Diagnosis not present

## 2020-07-31 DIAGNOSIS — Z7902 Long term (current) use of antithrombotics/antiplatelets: Secondary | ICD-10-CM | POA: Diagnosis not present

## 2020-07-31 DIAGNOSIS — G9340 Encephalopathy, unspecified: Secondary | ICD-10-CM

## 2020-07-31 DIAGNOSIS — Z8673 Personal history of transient ischemic attack (TIA), and cerebral infarction without residual deficits: Secondary | ICD-10-CM

## 2020-07-31 DIAGNOSIS — F0390 Unspecified dementia without behavioral disturbance: Secondary | ICD-10-CM | POA: Diagnosis not present

## 2020-07-31 DIAGNOSIS — E785 Hyperlipidemia, unspecified: Secondary | ICD-10-CM

## 2020-07-31 DIAGNOSIS — Z79899 Other long term (current) drug therapy: Secondary | ICD-10-CM | POA: Insufficient documentation

## 2020-07-31 DIAGNOSIS — Z7982 Long term (current) use of aspirin: Secondary | ICD-10-CM | POA: Insufficient documentation

## 2020-07-31 DIAGNOSIS — E1122 Type 2 diabetes mellitus with diabetic chronic kidney disease: Secondary | ICD-10-CM | POA: Diagnosis not present

## 2020-07-31 DIAGNOSIS — E7800 Pure hypercholesterolemia, unspecified: Secondary | ICD-10-CM

## 2020-07-31 DIAGNOSIS — R410 Disorientation, unspecified: Secondary | ICD-10-CM | POA: Diagnosis not present

## 2020-07-31 DIAGNOSIS — I5032 Chronic diastolic (congestive) heart failure: Secondary | ICD-10-CM

## 2020-07-31 DIAGNOSIS — I2510 Atherosclerotic heart disease of native coronary artery without angina pectoris: Secondary | ICD-10-CM | POA: Diagnosis not present

## 2020-07-31 DIAGNOSIS — E871 Hypo-osmolality and hyponatremia: Secondary | ICD-10-CM

## 2020-07-31 DIAGNOSIS — E1169 Type 2 diabetes mellitus with other specified complication: Secondary | ICD-10-CM

## 2020-07-31 DIAGNOSIS — I130 Hypertensive heart and chronic kidney disease with heart failure and stage 1 through stage 4 chronic kidney disease, or unspecified chronic kidney disease: Secondary | ICD-10-CM | POA: Diagnosis not present

## 2020-07-31 DIAGNOSIS — G459 Transient cerebral ischemic attack, unspecified: Principal | ICD-10-CM

## 2020-07-31 DIAGNOSIS — N183 Chronic kidney disease, stage 3 unspecified: Secondary | ICD-10-CM | POA: Insufficient documentation

## 2020-07-31 DIAGNOSIS — R404 Transient alteration of awareness: Secondary | ICD-10-CM | POA: Diagnosis not present

## 2020-07-31 DIAGNOSIS — Z87891 Personal history of nicotine dependence: Secondary | ICD-10-CM | POA: Insufficient documentation

## 2020-07-31 DIAGNOSIS — Z20822 Contact with and (suspected) exposure to covid-19: Secondary | ICD-10-CM | POA: Diagnosis not present

## 2020-07-31 DIAGNOSIS — J323 Chronic sphenoidal sinusitis: Secondary | ICD-10-CM | POA: Diagnosis not present

## 2020-07-31 DIAGNOSIS — R29810 Facial weakness: Secondary | ICD-10-CM | POA: Diagnosis not present

## 2020-07-31 DIAGNOSIS — I10 Essential (primary) hypertension: Secondary | ICD-10-CM

## 2020-07-31 DIAGNOSIS — J90 Pleural effusion, not elsewhere classified: Secondary | ICD-10-CM | POA: Diagnosis not present

## 2020-07-31 DIAGNOSIS — R4182 Altered mental status, unspecified: Secondary | ICD-10-CM | POA: Diagnosis not present

## 2020-07-31 DIAGNOSIS — J0130 Acute sphenoidal sinusitis, unspecified: Secondary | ICD-10-CM | POA: Diagnosis not present

## 2020-07-31 DIAGNOSIS — G319 Degenerative disease of nervous system, unspecified: Secondary | ICD-10-CM | POA: Diagnosis not present

## 2020-07-31 DIAGNOSIS — I639 Cerebral infarction, unspecified: Secondary | ICD-10-CM | POA: Diagnosis not present

## 2020-07-31 DIAGNOSIS — G9341 Metabolic encephalopathy: Secondary | ICD-10-CM

## 2020-07-31 DIAGNOSIS — G919 Hydrocephalus, unspecified: Secondary | ICD-10-CM | POA: Diagnosis not present

## 2020-07-31 LAB — COMPREHENSIVE METABOLIC PANEL
ALT: 11 U/L (ref 0–44)
AST: 18 U/L (ref 15–41)
Albumin: 3.5 g/dL (ref 3.5–5.0)
Alkaline Phosphatase: 85 U/L (ref 38–126)
Anion gap: 8 (ref 5–15)
BUN: 11 mg/dL (ref 8–23)
CO2: 25 mmol/L (ref 22–32)
Calcium: 9.1 mg/dL (ref 8.9–10.3)
Chloride: 95 mmol/L — ABNORMAL LOW (ref 98–111)
Creatinine, Ser: 0.9 mg/dL (ref 0.44–1.00)
GFR, Estimated: 59 mL/min — ABNORMAL LOW (ref >60)
Glucose, Bld: 99 mg/dL (ref 70–99)
Potassium: 3.8 mmol/L (ref 3.5–5.1)
Sodium: 128 mmol/L — ABNORMAL LOW (ref 135–145)
Total Bilirubin: 0.7 mg/dL (ref 0.3–1.2)
Total Protein: 6.8 g/dL (ref 6.5–8.1)

## 2020-07-31 LAB — CBC WITH DIFFERENTIAL/PLATELET
Abs Immature Granulocytes: 0.02 10*3/uL (ref 0.00–0.07)
Basophils Absolute: 0.1 10*3/uL (ref 0.0–0.1)
Basophils Relative: 1 %
Eosinophils Absolute: 0.1 10*3/uL (ref 0.0–0.5)
Eosinophils Relative: 1 %
HCT: 37.3 % (ref 36.0–46.0)
Hemoglobin: 12.2 g/dL (ref 12.0–15.0)
Immature Granulocytes: 0 %
Lymphocytes Relative: 25 %
Lymphs Abs: 1.9 10*3/uL (ref 0.7–4.0)
MCH: 30.3 pg (ref 26.0–34.0)
MCHC: 32.7 g/dL (ref 30.0–36.0)
MCV: 92.8 fL (ref 80.0–100.0)
Monocytes Absolute: 0.5 10*3/uL (ref 0.1–1.0)
Monocytes Relative: 7 %
Neutro Abs: 4.9 10*3/uL (ref 1.7–7.7)
Neutrophils Relative %: 66 %
Platelets: 320 10*3/uL (ref 150–400)
RBC: 4.02 MIL/uL (ref 3.87–5.11)
RDW: 13.1 % (ref 11.5–15.5)
WBC: 7.4 10*3/uL (ref 4.0–10.5)
nRBC: 0 % (ref 0.0–0.2)

## 2020-07-31 LAB — URINALYSIS, ROUTINE W REFLEX MICROSCOPIC
Bilirubin Urine: NEGATIVE
Glucose, UA: NEGATIVE mg/dL
Ketones, ur: NEGATIVE mg/dL
Nitrite: NEGATIVE
Protein, ur: NEGATIVE mg/dL
Specific Gravity, Urine: 1.006 (ref 1.005–1.030)
WBC, UA: >50 WBC/hpf — ABNORMAL HIGH (ref 0–5)
pH: 7 (ref 5.0–8.0)

## 2020-07-31 LAB — I-STAT CHEM 8, ED
BUN: 12 mg/dL (ref 8–23)
Calcium, Ion: 1.16 mmol/L (ref 1.15–1.40)
Chloride: 94 mmol/L — ABNORMAL LOW (ref 98–111)
Creatinine, Ser: 0.9 mg/dL (ref 0.44–1.00)
Glucose, Bld: 103 mg/dL — ABNORMAL HIGH (ref 70–99)
HCT: 37 % (ref 36.0–46.0)
Hemoglobin: 12.6 g/dL (ref 12.0–15.0)
Potassium: 4 mmol/L (ref 3.5–5.1)
Sodium: 130 mmol/L — ABNORMAL LOW (ref 135–145)
TCO2: 24 mmol/L (ref 22–32)

## 2020-07-31 LAB — RESP PANEL BY RT-PCR (FLU A&B, COVID) ARPGX2
Influenza A by PCR: NEGATIVE
Influenza B by PCR: NEGATIVE
SARS Coronavirus 2 by RT PCR: NEGATIVE

## 2020-07-31 LAB — TSH: TSH: 0.747 u[IU]/mL (ref 0.350–4.500)

## 2020-07-31 LAB — PROTIME-INR
INR: 1 (ref 0.8–1.2)
Prothrombin Time: 13.2 seconds (ref 11.4–15.2)

## 2020-07-31 LAB — CBC
HCT: 34.8 % — ABNORMAL LOW (ref 36.0–46.0)
Hemoglobin: 11.9 g/dL — ABNORMAL LOW (ref 12.0–15.0)
MCH: 31.6 pg (ref 26.0–34.0)
MCHC: 34.2 g/dL (ref 30.0–36.0)
MCV: 92.3 fL (ref 80.0–100.0)
Platelets: 272 10*3/uL (ref 150–400)
RBC: 3.77 MIL/uL — ABNORMAL LOW (ref 3.87–5.11)
RDW: 13.2 % (ref 11.5–15.5)
WBC: 8 10*3/uL (ref 4.0–10.5)
nRBC: 0 % (ref 0.0–0.2)

## 2020-07-31 LAB — CBG MONITORING, ED: Glucose-Capillary: 112 mg/dL — ABNORMAL HIGH (ref 70–99)

## 2020-07-31 LAB — BRAIN NATRIURETIC PEPTIDE: B Natriuretic Peptide: 86 pg/mL (ref 0.0–100.0)

## 2020-07-31 LAB — APTT: aPTT: 32 seconds (ref 24–36)

## 2020-07-31 LAB — AMMONIA: Ammonia: 17 umol/L (ref 9–35)

## 2020-07-31 MED ORDER — ATORVASTATIN CALCIUM 10 MG PO TABS
20.0000 mg | ORAL_TABLET | Freq: Every day | ORAL | Status: DC
  Administered 2020-08-01 – 2020-08-02 (×2): 20 mg via ORAL
  Filled 2020-07-31 (×2): qty 2

## 2020-07-31 MED ORDER — CLOPIDOGREL BISULFATE 75 MG PO TABS
75.0000 mg | ORAL_TABLET | Freq: Every day | ORAL | Status: DC
  Administered 2020-08-01 – 2020-08-02 (×2): 75 mg via ORAL
  Filled 2020-07-31 (×2): qty 1

## 2020-07-31 MED ORDER — LORAZEPAM 2 MG/ML IJ SOLN
0.5000 mg | Freq: Once | INTRAMUSCULAR | Status: AC
  Administered 2020-07-31: 0.5 mg via INTRAVENOUS
  Filled 2020-07-31: qty 1

## 2020-07-31 MED ORDER — SODIUM CHLORIDE 0.9 % IV SOLN: INTRAVENOUS | Status: DC

## 2020-07-31 MED ORDER — HYDROXYZINE HCL 25 MG PO TABS: 25.0000 mg | ORAL_TABLET | Freq: Every evening | ORAL | Status: DC | PRN

## 2020-07-31 MED ORDER — ASPIRIN 325 MG PO TABS: 325.0000 mg | ORAL_TABLET | Freq: Every day | ORAL | Status: DC

## 2020-07-31 MED ORDER — SENNOSIDES-DOCUSATE SODIUM 8.6-50 MG PO TABS: 1.0000 | ORAL_TABLET | Freq: Every evening | ORAL | Status: DC | PRN

## 2020-07-31 MED ORDER — CLOPIDOGREL BISULFATE 75 MG PO TABS: 75.0000 mg | ORAL_TABLET | Freq: Every day | ORAL | Status: DC

## 2020-07-31 MED ORDER — INSULIN ASPART 100 UNIT/ML IJ SOLN: 0.0000 [IU] | INTRAMUSCULAR | Status: DC

## 2020-07-31 MED ORDER — INSULIN ASPART 100 UNIT/ML IJ SOLN
0.0000 [IU] | Freq: Three times a day (TID) | INTRAMUSCULAR | Status: DC
  Administered 2020-08-01: 2 [IU] via SUBCUTANEOUS
  Administered 2020-08-01: 1 [IU] via SUBCUTANEOUS
  Administered 2020-08-02: 2 [IU] via SUBCUTANEOUS

## 2020-07-31 MED ORDER — ACETAMINOPHEN 325 MG PO TABS
650.0000 mg | ORAL_TABLET | ORAL | Status: DC | PRN
  Administered 2020-08-01: 650 mg via ORAL
  Filled 2020-07-31: qty 2

## 2020-07-31 MED ORDER — SODIUM CHLORIDE 0.9% FLUSH
3.0000 mL | Freq: Once | INTRAVENOUS | Status: AC
Start: 2020-07-31 — End: 2020-07-31
  Administered 2020-07-31: 3 mL via INTRAVENOUS

## 2020-07-31 MED ORDER — ACETAMINOPHEN 160 MG/5ML PO SOLN: 650.0000 mg | ORAL | Status: DC | PRN

## 2020-07-31 MED ORDER — ACETAMINOPHEN 650 MG RE SUPP: 650.0000 mg | RECTAL | Status: DC | PRN

## 2020-07-31 MED ORDER — STROKE: EARLY STAGES OF RECOVERY BOOK: Freq: Once | Status: AC

## 2020-07-31 MED ORDER — ASPIRIN 325 MG PO TABS
325.0000 mg | ORAL_TABLET | Freq: Every day | ORAL | Status: DC
  Administered 2020-08-01 – 2020-08-02 (×2): 325 mg via ORAL
  Filled 2020-07-31 (×2): qty 1

## 2020-07-31 MED ORDER — ATORVASTATIN CALCIUM 10 MG PO TABS: 20.0000 mg | ORAL_TABLET | Freq: Every day | ORAL | Status: DC

## 2020-07-31 NOTE — H&P (Signed)
Brenda Lynch TJQ:[id number] DOB: Jul 22, 1923 DOA: 07/31/2020     PCP: Georgann Housekeeper, MD   Outpatient Specialists:    NEurology  Dr. Lucia Gaskins   Patient arrived to ER on 07/31/20 at 1420 Referred by Attending Therisa Doyne, MD   Patient coming from: home Lives  With family    Chief Complaint:   Chief Complaint  Patient presents with  . Code Stroke    HPI: Brenda Lynch is a 87 y.o. female with medical history significant of CHF, CKD, CAD, DM II, HLD, dementia,and HTN     Presented with   Last seen normal at 11AM by 2PM had symptoms of confusion by the time she arived to ER symptoms improved, no focal deficts and NIH score 2 Reports she have had similar episodes first started 3 da ago lasting about 1 h Preceded by staring spell, non verbal and the confsuiong     Has  been vaccinated against COVID     Initial COVID TEST  NEGATIVE   Lab Results  Component Value Date   SARSCOV2NAA NEGATIVE 07/31/2020   SARSCOV2NAA NEGATIVE 06/09/2020     Regarding pertinent Chronic problems:     Hyperlipidemia -  on statins Lipitor Lipid Panel     Component Value Date/Time   CHOL 181 06/10/2020 0221   CHOL 207 (H) 05/16/2019 1351   TRIG 57 06/10/2020 0221   HDL 52 06/10/2020 0221   HDL 60 05/16/2019 1351   CHOLHDL 3.5 06/10/2020 0221   VLDL 11 06/10/2020 0221   LDLCALC 118 (H) 06/10/2020 0221   LDLCALC 135 (H) 05/16/2019 1351   LABVLDL 12 05/16/2019 1351     HTN on ramipril   chronic CHF diastolic - not in diuretics     DM 2 -  Lab Results  Component Value Date   HGBA1C 6.1 (H) 06/10/2020    diet controlled     CKD stage III - baseline Cr 1.2 CrCl cannot be calculated (Unknown ideal weight.).  Lab Results  Component Value Date   CREATININE 0.90 07/31/2020   CREATININE 1.20 (H) 06/15/2020   CREATININE 1.10 (H) 06/15/2020       Dementia - on Atarax at nighttime   While in ER: CT head non acute, neurology was consulted and rec admit for TIA work  up     ED Triage Vitals  Enc Vitals Group     BP 07/31/20 1611 (!) 142/72     Pulse Rate 07/31/20 1611 (!) 54     Resp 07/31/20 1611 16     Temp 07/31/20 1507 98.1 F (36.7 C)     Temp Source 07/31/20 1507 Oral     SpO2 07/31/20 1611 100 %     Weight --      Height --      Head Circumference --      Peak Flow --      Pain Score 07/31/20 1507 0     Pain Loc --      Pain Edu? --      Excl. in GC? --   TMAX(24)@     _________________________________________ Significant initial  Findings: Abnormal Labs Reviewed  CBC - Abnormal; Notable for the following components:      Result Value   RBC 3.77 (*)    Hemoglobin 11.9 (*)    HCT 34.8 (*)    All other components within normal limits  I-STAT CHEM 8, ED - Abnormal; Notable for the following components:   Sodium  130 (*)    Chloride 94 (*)    Glucose, Bld 103 (*)    All other components within normal limits   ____________________________________________ Ordered CT HEAD   NON acute  CXR -  NON acute  _________________________   ECG: Ordered Personally reviewed by me showing: HR : 58 Rhythm: Sinus bradycardia    no evidence of ischemic changes QTC 456  The recent clinical data is shown below. Vitals:   07/31/20 1645 07/31/20 1715 07/31/20 1745 07/31/20 1815  BP: (!) 148/73 (!) 160/69 (!) 176/78 (!) 169/119  Pulse: (!) 56 (!) 58 63 65  Resp: 16 20 13 17   Temp:      TempSrc:      SpO2: 100% 100% 100% 100%   WBC     Component Value Date/Time   WBC 7.4 07/31/2020 1800   LYMPHSABS 1.9 07/31/2020 1800   LYMPHSABS 2.0 02/17/2020 1119   MONOABS 0.5 07/31/2020 1800   EOSABS 0.1 07/31/2020 1800   EOSABS 0.1 02/17/2020 1119   BASOSABS 0.1 07/31/2020 1800   BASOSABS 0.1 02/17/2020 1119       UA  ordered     Results for orders placed or performed during the hospital encounter of 07/31/20  Resp Panel by RT-PCR (Flu A&B, Covid) Nasopharyngeal Swab     Status: None   Collection Time: 07/31/20  5:34 PM   Specimen:  Nasopharyngeal Swab; Nasopharyngeal(NP) swabs in vial transport medium  Result Value Ref Range Status   SARS Coronavirus 2 by RT PCR NEGATIVE NEGATIVE Final         Influenza A by PCR NEGATIVE NEGATIVE Final   Influenza B by PCR NEGATIVE NEGATIVE Final         _______________________________________________________ ER Provider Called: Neurology     Dr. Erlinda Hong They Recommend admit to medicine   SEEN in ER _______________________________________________ Hospitalist was called for admission for  TIA  The following Work up has been ordered so far:  Orders Placed This Encounter  Procedures  . Urine culture  . Resp Panel by RT-PCR (Flu A&B, Covid) Nasopharyngeal Swab  . CT HEAD CODE STROKE WO CONTRAST  . MR BRAIN WO CONTRAST  . MR ANGIO HEAD WO CONTRAST  . Protime-INR  . APTT  . Urinalysis, Routine w reflex microscopic  . Ammonia  . TSH  . Brain natriuretic peptide  . CBC  . Comprehensive metabolic panel  . CBC with Differential/Platelet  . Hemoglobin A1c  . Lipid panel  . Diet NPO time specified  . Cardiac monitoring  . Stroke swallow screen  . NIH Stroke Scale  . Modified Stroke Scale (mNIHSS) Document mNIHSS assessment every 2 hours for a total of 12 hours  . Saline Lock IV, Maintain IV access  . If O2 Sat <94% administer O2 at 2 liters/minute via nasal cannula  . Cardiac monitoring  . Consult to hospitalist  . Pulse oximetry, continuous  . I-stat chem 8, ED  . CBG monitoring, ED  . ED EKG  . EKG 12-Lead  . EEG adult  . Place in observation (patient's expected length of stay will be less than 2 midnights)  . VAS US CAROTID    Following Medications were ordered in ER: Medications  aspirin tablet 325 mg (has no administration in time range)  atorvastatin (LIPITOR) tablet 20 mg (has no administration in time range)  clopidogrel (PLAVIX) tablet 75 mg (has no administration in time range)  sodium chloride flush (NS) 0.9 % injection 3 mL (3 mLs Intravenous  Given 07/31/20  1619)        Consult Orders  (From admission, onward)         Start     Ordered   07/31/20 1812  Consult to hospitalist  Pg sent by deloris  Once       Provider:  (Not yet assigned)  Question Answer Comment  Place call to: [facility]   Reason for Consult Admit      07/31/20 1811          OTHER Significant initial  Findings:  labs showing:    Recent Labs  Lab 07/31/20 1422  NA 130*  K 4.0  GLUCOSE 103*  BUN 12  CREATININE 0.90    Cr   stable,    Lab Results  Component Value Date   CREATININE 0.90 07/31/2020   CREATININE 1.20 (H) 06/15/2020   CREATININE 1.10 (H) 06/15/2020    No results for input(s): AST, ALT, ALKPHOS, BILITOT, PROT, ALBUMIN in the last 168 hours. Lab Results  Component Value Date   CALCIUM 8.8 (L) 06/15/2020           Plt: Lab Results  Component Value Date   PLT 320 07/31/2020      Recent Labs  Lab 07/31/20 1422 07/31/20 1640 07/31/20 1800  WBC  --  8.0 7.4  NEUTROABS  --   --  4.9  HGB 12.6 11.9* 12.2  HCT 37.0 34.8* 37.3  MCV  --  92.3 92.8  PLT  --  272 320    HG/HCT   Stable, below    Component Value Date/Time   HGB 12.2 07/31/2020 1800   HGB 13.6 02/17/2020 1119   HCT 37.3 07/31/2020 1800   HCT 39.5 02/17/2020 1119   MCV 92.8 07/31/2020 1800   MCV 96 02/17/2020 1119     No results for input(s): LIPASE, AMYLASE in the last 168 hours. No results for input(s): AMMONIA in the last 168 hours.   Cardiac Panel (last 3 results) No results for input(s): CKTOTAL, CKMB, TROPONINI, RELINDX in the last 72 hours.   BNP (last 3 results) Recent Labs    07/31/20 1433  BNP 86.0      DM  labs:  HbA1C: Recent Labs    06/10/20 0221  HGBA1C 6.1*       CBG (last 3)  No results for input(s): GLUCAP in the last 72 hours.        Cultures:    Component Value Date/Time   SDES URINE, CLEAN CATCH 06/09/2020 1708   SPECREQUEST NONE 06/09/2020 1708   CULT (A) 06/09/2020 1708    <10,000 COLONIES/mL  INSIGNIFICANT GROWTH Performed at [facility] [address], [place], [place] [address]    REPTSTATUS 06/11/2020 FINAL 06/09/2020 1708     Radiological Exams on Admission: CT HEAD CODE STROKE WO CONTRAST  Result Date: 07/31/2020 CLINICAL DATA:  Code stroke. Neuro deficit, acute, stroke suspected. EXAM: CT HEAD WITHOUT CONTRAST TECHNIQUE: Contiguous axial images were obtained from the base of the skull through the vertex without intravenous contrast. COMPARISON:  Brain MRI 06/15/2020. head CT 06/15/2020. FINDINGS: Brain: The examination is significantly motion degraded, limiting evaluation. Moderate cerebral atrophy.  Comparatively mild cerebellar atrophy. Mild patchy and ill-defined hypoattenuation within the cerebral white matter is nonspecific, but compatible with chronic small vessel ischemic disease. Unchanged 13 mm calcified meningioma along the anterior falx. Within described limitations, no acute intracranial hemorrhage is identified. No acute demarcated cortical infarct is identified. No extra-axial fluid  collection is identified. No midline shift. Vascular: No hyperdense vessel.  Atherosclerotic calcifications. Skull: Normal. Negative for fracture or focal lesion. Sinuses/Orbits: Visualized orbits show no acute finding. Frothy secretions and mild to moderate mucosal thickening within the left sphenoid sinus. Trace bilateral ethmoid sinus mucosal thickening. ASPECTS [facility] Stroke Program Early CT Score) - Ganglionic level infarction (caudate, lentiform nuclei, internal capsule, insula, M1-M3 cortex): 7 - Supraganglionic infarction (M4-M6 cortex): 3 Total score (0-10 with 10 being normal): 10 These results were communicated to Dr. Erlinda Hong At 2:43 pmon 5/14/2022by text page via the [facility] messaging system. IMPRESSION: Significantly motion degraded examination, limiting evaluation. No acute intracranial abnormality is identified. Stable atrophy and chronic small vessel ischemic disease. Unchanged  13 mm calcified meningioma along the anterior falx. Paranasal sinus disease, as described. Electronically Signed   By: Kellie Simmering DO   On: 07/31/2020 14:43   _______________________________________________________________________________________________________ Latest  Blood pressure (!) 169/119, pulse 65, temperature 98.1 F (36.7 C), temperature source Oral, resp. rate 17, SpO2 100 %.   Review of Systems:    Pertinent positives include:   fatigue,  confusion Constitutional:  No weight loss, night sweats, Fevers, chills,weight loss  HEENT:  No headaches, Difficulty swallowing,Tooth/dental problems,Sore throat,  No sneezing, itching, ear ache, nasal congestion, post nasal drip,  Cardio-vascular:  No chest pain, Orthopnea, PND, anasarca, dizziness, palpitations.no Bilateral lower extremity swelling  GI:  No heartburn, indigestion, abdominal pain, nausea, vomiting, diarrhea, change in bowel habits, loss of appetite, melena, blood in stool, hematemesis Resp:  no shortness of breath at rest. No dyspnea on exertion, No excess mucus, no productive cough, No non-productive cough, No coughing up of blood.No change in color of mucus.No wheezing. Skin:  no rash or lesions. No jaundice GU:  no dysuria, change in color of urine, no urgency or frequency. No straining to urinate.  No flank pain.  Musculoskeletal:  No joint pain or no joint swelling. No decreased range of motion. No back pain.  Psych:  No change in mood or affect. No depression or anxiety. No memory loss.  Neuro: no localizing neurological complaints, no tingling, no weakness, no double vision, no gait abnormality, no slurred speech, no   All systems reviewed and apart from [place] all are negative _______________________________________________________________________________________________ Past Medical History:   Past Medical History:  Diagnosis Date  . Allergic rhinitis   . Chronic diastolic CHF (congestive heart failure)  ([place])   . CKD (chronic kidney disease), stage II   . Coronary artery disease   . Diabetes mellitus   . Diastolic dysfunction   . Diverticulosis   . Drug-induced bradycardia 01/28/2016  . Hypercholesterolemia   . Hypertension   . Hyponatremia   . Meningioma ([place])   . Osteoarthritis   . Sedentary lifestyle   . Spinal stenosis   . Vertigo, benign positional       Past Surgical History:  Procedure Laterality Date  . BACK SURGERY    . BREAST BIOPSY    . SKIN FULL THICKNESS GRAFT Right 11/08/2017   Procedure: RIGHT SMALL FINGER ABLATION OF NAIL ANDSKIN GRAFT FULL THICKNESS;  Surgeon: Leanora Cover, MD;  Location: [facility];  Service: Orthopedics;  Laterality: Right;  . TONSILLECTOMY      Social History:  Ambulatory  cane,      reports that she quit smoking about 41 years ago. She has never used smokeless tobacco. She reports that she does not drink alcohol and does not use drugs.   Family History:   Family  History  Problem Relation Age of Onset  . Heart disease Mother   . Heart attack Mother   . Prostate cancer Father   . Lung cancer Brother   . Pancreatic cancer Brother   . Headache Neg Hx    ______________________________________________________________________________________________ Allergies: Allergies  Allergen Reactions  . Ciprofloxacin Nausea Only and Other (See Comments)    GI intolerance  . Doxycycline Hyclate     Other reaction(s): BS elev, Head pressure  . Penicillins Hives and Other (See Comments)    Welts, also Has patient had a PCN reaction causing immediate rash, facial/tongue/throat swelling, SOB or lightheadedness with hypotension: Yes Has patient had a PCN reaction causing severe rash involving mucus membranes or skin necrosis: No Has patient had a PCN reaction that required hospitalization No Has patient had a PCN reaction occurring within the last 10 years: No If all of the above answers are "NO", then may proceed with  Cephalosporin use.  Marland Kitchen Penicillin G Hives and Other (See Comments)    Welts on the body     Prior to Admission medications   Medication Sig Start Date End Date Taking? Authorizing Provider  acetaminophen (TYLENOL) 500 MG tablet Take 500 mg by mouth every 6 (six) hours as needed (for headaches or mild pain).    [provider]  aspirin 325 MG tablet Take 1 tablet (325 mg total) by mouth daily. 06/12/20   Hosie Poisson, MD  atorvastatin (LIPITOR) 20 MG tablet Take 1 tablet (20 mg total) by mouth daily. 06/12/20   Hosie Poisson, MD  calcium carbonate (TUMS - DOSED IN MG ELEMENTAL CALCIUM) 500 MG chewable tablet Chew 1 tablet by mouth daily as needed for indigestion or heartburn.    [provider]  calcium citrate-vitamin D (CITRACAL+D) 315-200 MG-UNIT per tablet Take 1 tablet by mouth daily.    [provider]  cetirizine (ZYRTEC) 10 MG tablet Take 10 mg by mouth at bedtime as needed for allergies or rhinitis.     [provider]  Chlorphen-Phenyleph-APAP (CORICIDIN D COLD/FLU/SINUS) 2-5-325 MG TABS Take 1 capsule by mouth every 6 (six) hours as needed (for symptoms).    [provider]  Cholecalciferol (VITAMIN D3) 25 MCG (1000 UT) CAPS Take 1,000 Units by mouth daily.    [provider]  clopidogrel (PLAVIX) 75 MG tablet Take 1 tablet (75 mg total) by mouth daily. 06/12/20   Hosie Poisson, MD  clotrimazole (LOTRIMIN) 1 % cream Apply to both feet and between toes twice daily Patient taking differently: Apply 1 application topically See admin instructions. Apply to both feet and between toes twice daily 02/05/18   Marzetta Board, DPM  diphenhydrAMINE (BENADRYL) 25 MG tablet Take 25 mg by mouth every 6 (six) hours as needed for allergies.    [provider]  estradiol (ESTRACE) 0.1 MG/GM vaginal cream Place 1 Applicatorful vaginally 2 (two) times a week. Monday and Friday     [provider]  fluticasone (FLONASE) 50 MCG/ACT  nasal spray Place 1-2 sprays into both nostrils daily as needed for allergies or rhinitis.    [provider]  hydrochlorothiazide (MICROZIDE) 12.5 MG capsule TAKE (1) CAPSULE DAILY. Patient taking differently: Take 12.5 mg by mouth daily. 05/25/20   Sueanne Margarita, MD  hydrocortisone (ANUSOL-HC) 2.5 % rectal cream Place 1 application rectally 2 (two) times daily as needed for hemorrhoids or anal itching.    [provider]  hydrocortisone (ANUSOL-HC) 25 MG suppository Place 25 mg rectally 2 (two)  times daily as needed for hemorrhoids or itching.     [provider]  hydrOXYzine (ATARAX/VISTARIL) 25 MG tablet Take 25 mg by mouth at bedtime.    [provider]  Lancets [facility] ULTRASOFT) lancets 1 each by Other route as directed.  05/01/14   [provider]  Misc Natural Products (OSTEO BI-FLEX ADV DOUBLE ST PO) Take 1 tablet by mouth daily.    [provider]  montelukast (SINGULAIR) 10 MG tablet Take 10 mg by mouth at bedtime. 12/12/12   [provider]  ONE TOUCH ULTRA TEST test strip 1 each by Other route as directed.  06/21/14   [provider]  Polyethyl Glyc-Propyl Glyc PF (SYSTANE HYDRATION PF) 0.4-0.3 % SOLN Place 1 drop into both eyes 3 (three) times daily as needed (for irritation).    [provider]  polyethylene glycol powder (GLYCOLAX/MIRALAX) 17 GM/SCOOP powder Take 8.5-17 g by mouth daily as needed for mild constipation (MIX AND DRINK).    [provider]  potassium chloride (MICRO-K) 10 MEQ CR capsule Take 10 mEq by mouth in the morning.  07/17/14   [provider]  PRILOSEC OTC 20 MG tablet Take 20 mg by mouth daily before breakfast.    [provider]  Probiotic Product (ALIGN) 4 MG CAPS Take 4 mg by mouth daily as needed (to support G.I. health).    [provider]  ramipril (ALTACE) 10 MG capsule Take 10 mg by mouth in the morning.  10/06/19   [provider]   Saline (SIMPLY SALINE) 0.9 % AERS Place 1 spray into both nostrils as needed (for congestion).    [provider]  senna (SENOKOT) 8.6 MG TABS tablet Take 1 tablet by mouth at bedtime as needed for mild constipation.     [provider]  traZODone (DESYREL) 50 MG tablet Take 25 mg by mouth at bedtime as needed for sleep. 09/08/19   [provider]    ___________________________________________________________________________________________________ Physical Exam: Vitals with BMI 07/31/2020 07/31/2020 07/31/2020  Height - - -  Weight - - -  BMI - - -  Systolic 510 258 527  Diastolic 782 78 69  Pulse 65 63 58     1. General:  in No Acute distress   Chronically ill  -appearing 2. Psychological: Alert and  Oriented 3. Head/ENT:    Dry Mucous Membranes                          Head Non traumatic, neck supple                         Poor Dentition 4. SKIN  decreased Skin turgor,  Skin clean Dry and intact no rash 5. Heart: Regular rate and rhythm systolic Murmur, no Rub or gallop 6. Lungs:   no wheezes or crackles   7. Abdomen: Soft,  non-tender, Non distended   obese  bowel sounds present 8. Lower extremities: no clubbing, cyanosis, no   edema 9. Neurologically Grossly intact, moving all 4 extremities equally  10. MSK: Normal range of motion    Chart has been reviewed  ______________________________________________________________________________________________  Assessment/Plan   87 y.o. female with medical history significant of CHF, CKD, CAD, DM II, HLD, dementia,and HTN      Admitted for TIA vs seizure vs acute encephalopathy  Present on Admission: . TIA (transient ischemic attack) -  - will admit based on TIA/CVA  protocol, Initial Stroke scale 2       Monitor on Tele       MRA/MRI await results Carotid Doppler         Echo to evaluate for possible embolic source,        obtain cardiac enzymes,  ECG,   Lipid panel, TSH.        Order PT/OT  evaluation.   EEG ordered Speech pathology evaluation       Will make sure patient is on antiplatelet ASA 81  Plavix agent and statin        Allow permissive Hypertension keep BP <220/120        Neurology consulted Have seen pt in ER   Possible acute encephalopathy check for any source of infection gently rehydrate  . Type 2 diabetes mellitus with hyperlipidemia (HCC) -diet controlled order sliding scale  . Hypertension -allow permissive hypertension for tonight  . Hypercholesterolemia -continue statin  . Dementia without behavioral disturbance ([place]) -negative for any signs and changes in mental status monitor for any sundowning  . Chronic diastolic CHF (congestive heart failure) (HCC) current appears to be in a dry side we will gently rehydrate  . Hyponatremia obtain urine electrolytes gently rehydrate , follow sodium levels   Other plan as per orders.  DVT prophylaxis:  SCD      Code Status:    Code Status: Prior   DNR/DNI  as per  family  I had personally discussed CODE STATUS with  Family       Family Communication:   Family   at  Bedside  plan of care was discussed  with  Son,    Disposition Plan:        To home once workup is complete and patient is stable   Following barriers for discharge:                            Electrolytes corrected                                                        Will need consultants to evaluate patient prior to discharge                    Would benefit from PT/OT eval prior to DC  Ordered                   Swallow eval - SLP ordered                                       Consults called: Neurology   Admission status:  ED Disposition    ED Disposition Condition [place]: [place] [[id number]]  Level of Care: Telemetry Medical [104]  I expect the patient will be discharged within 24 hours: No (not a candidate for 5C-Observation unit)  Covid Evaluation: Confirmed COVID Negative  Diagnosis:  TIA (transient ischemic attackPP:[id number]  Admitting Physician: Toy Baker [[id number]]  Attending Physician: Toy Baker [[id number]]        Obs   Level of care     Tele  indefinitely please discontinue once patient no longer  qualifies COVID-19 Labs    Lab Results  Component Value Date   [place] NEGATIVE 07/31/2020     Precautions: admitted as   Covid Negative      PPE: Used by the provider:   N95  eye Goggles,  Gloves     Itati Brocksmith 07/31/2020, 9:52 PM    [facility]     after 2 AM please page floor coverage PA If 7AM-7PM, please contact the day team taking care of the patient using [email or web address]   Patient was evaluated in the context of the global COVID-19 pandemic, which necessitated consideration that the patient might be at risk for infection with the SARS-CoV-2 virus that causes COVID-19. Institutional protocols and algorithms that pertain to the evaluation of patients at risk for COVID-19 are in a state of rapid change based on information released by regulatory bodies including the CDC and federal and state organizations. These policies and algorithms were followed during the patient's care.

## 2020-07-31 NOTE — ED Notes (Signed)
Pat to ED bed 21 at this time

## 2020-07-31 NOTE — ED Notes (Signed)
Radiology at bedside at this time.

## 2020-07-31 NOTE — ED Notes (Signed)
Patient ambulatory to bathroom with assistance of son at this time.

## 2020-07-31 NOTE — ED Triage Notes (Signed)
PT BIB for CODE STROKE. Pt came from home and family called out due to pt having AMS after she woke up from a nap at 1100. Pt is alert but disorientated x2. Pt is hypertensive on arrival.

## 2020-07-31 NOTE — ED Provider Notes (Signed)
[facility] EMERGENCY DEPARTMENT Provider Note   CSN: [id number] Arrival date & time: 07/31/20  1420     History Chief Complaint  Patient presents with  . Code Stroke    Brenda Lynch is a 87 y.o. female.  The history is provided by the patient and medical records. No language interpreter was used.  Neurologic Problem This is a new problem. The current episode started 3 to 5 hours ago. The problem occurs constantly. The problem has been resolved. Pertinent negatives include no chest pain, no abdominal pain, no headaches and no shortness of breath. Nothing aggravates the symptoms. Nothing relieves the symptoms. She has tried nothing for the symptoms. The treatment provided no relief.       Past Medical History:  Diagnosis Date  . Allergic rhinitis   . Chronic diastolic CHF (congestive heart failure) ([place])   . CKD (chronic kidney disease), stage II   . Coronary artery disease   . Diabetes mellitus   . Diastolic dysfunction   . Diverticulosis   . Drug-induced bradycardia 01/28/2016  . Hypercholesterolemia   . Hypertension   . Hyponatremia   . Meningioma ([place])   . Osteoarthritis   . Sedentary lifestyle   . Spinal stenosis   . Vertigo, benign positional     Patient Active Problem List   Diagnosis Date Noted  . TIA (transient ischemic attack) 06/09/2020  . Type 2 diabetes mellitus with hyperlipidemia ([place]) 06/09/2020  . Dementia without behavioral disturbance ([place]) 06/09/2020  . Presbycusis of both ears 09/14/2016  . Drug-induced bradycardia 01/28/2016  . Cephalalgia 09/16/2015  . Orthostatic dizziness 09/16/2015  . Post-nasal drainage 09/16/2015  . SOB (shortness of breath) 07/29/2013  . Hypertension   . Diastolic dysfunction   . Hypercholesterolemia   . Diabetes mellitus   . Chronic diastolic CHF (congestive heart failure) ([place])     Past Surgical History:  Procedure Laterality Date  . BACK SURGERY    . BREAST BIOPSY    . SKIN FULL  THICKNESS GRAFT Right 11/08/2017   Procedure: RIGHT SMALL FINGER ABLATION OF NAIL ANDSKIN GRAFT FULL THICKNESS;  Surgeon: Leanora Cover, MD;  Location: [facility];  Service: Orthopedics;  Laterality: Right;  . TONSILLECTOMY       OB History   No obstetric history on file.     Family History  Problem Relation Age of Onset  . Heart disease Mother   . Heart attack Mother   . Prostate cancer Father   . Lung cancer Brother   . Pancreatic cancer Brother   . Headache Neg Hx     Social History   Tobacco Use  . Smoking status: Former Smoker    Quit date: 01/30/1979    Years since quitting: 41.5  . Smokeless tobacco: Never Used  Vaping Use  . Vaping Use: Never used  Substance Use Topics  . Alcohol use: No  . Drug use: No    Home Medications Prior to Admission medications   Medication Sig Start Date End Date Taking? Authorizing Provider  acetaminophen (TYLENOL) 500 MG tablet Take 500 mg by mouth every 6 (six) hours as needed (for headaches or mild pain).    [provider]  aspirin 325 MG tablet Take 1 tablet (325 mg total) by mouth daily. 06/12/20   Hosie Poisson, MD  atorvastatin (LIPITOR) 20 MG tablet Take 1 tablet (20 mg total) by mouth daily. 06/12/20   Hosie Poisson, MD  calcium carbonate (TUMS - DOSED IN MG  ELEMENTAL CALCIUM) 500 MG chewable tablet Chew 1 tablet by mouth daily as needed for indigestion or heartburn.    [provider]  calcium citrate-vitamin D (CITRACAL+D) 315-200 MG-UNIT per tablet Take 1 tablet by mouth daily.    [provider]  cetirizine (ZYRTEC) 10 MG tablet Take 10 mg by mouth at bedtime as needed for allergies or rhinitis.     [provider]  Chlorphen-Phenyleph-APAP (CORICIDIN D COLD/FLU/SINUS) 2-5-325 MG TABS Take 1 capsule by mouth every 6 (six) hours as needed (for symptoms).    [provider]  Cholecalciferol (VITAMIN D3) 25 MCG (1000 UT) CAPS Take 1,000 Units by mouth daily.    [provider]  clopidogrel (PLAVIX) 75 MG tablet Take 1 tablet (75 mg total) by mouth daily. 06/12/20   Hosie Poisson, MD  clotrimazole (LOTRIMIN) 1 % cream Apply to both feet and between toes twice daily Patient taking differently: Apply 1 application topically See admin instructions. Apply to both feet and between toes twice daily 02/05/18   Marzetta Board, DPM  diphenhydrAMINE (BENADRYL) 25 MG tablet Take 25 mg by mouth every 6 (six) hours as needed for allergies.    [provider]  estradiol (ESTRACE) 0.1 MG/GM vaginal cream Place 1 Applicatorful vaginally 2 (two) times a week. Monday and Friday     [provider]  fluticasone (FLONASE) 50 MCG/ACT nasal spray Place 1-2 sprays into both nostrils daily as needed for allergies or rhinitis.    [provider]  hydrochlorothiazide (MICROZIDE) 12.5 MG capsule TAKE (1) CAPSULE DAILY. Patient taking differently: Take 12.5 mg by mouth daily. 05/25/20   Sueanne Margarita, MD  hydrocortisone (ANUSOL-HC) 2.5 % rectal cream Place 1 application rectally 2 (two) times daily as needed for hemorrhoids or anal itching.    [provider]  hydrocortisone (ANUSOL-HC) 25 MG suppository Place 25 mg rectally 2 (two) times daily as needed for hemorrhoids or itching.     [provider]  hydrOXYzine (ATARAX/VISTARIL) 25 MG tablet Take 25 mg by mouth at bedtime.    [provider]  Lancets [facility] ULTRASOFT) lancets 1 each by Other route as directed.  05/01/14   [provider]  Misc Natural Products (OSTEO BI-FLEX ADV DOUBLE ST PO) Take 1 tablet by mouth daily.    [provider]  montelukast (SINGULAIR) 10 MG tablet Take 10 mg by mouth at bedtime. 12/12/12   [provider]  ONE TOUCH ULTRA TEST test strip 1 each by Other route as directed.  06/21/14   [provider]  Polyethyl Glyc-Propyl Glyc PF (SYSTANE HYDRATION PF) 0.4-0.3 % SOLN Place 1 drop into both eyes 3 (three) times  daily as needed (for irritation).    [provider]  polyethylene glycol powder (GLYCOLAX/MIRALAX) 17 GM/SCOOP powder Take 8.5-17 g by mouth daily as needed for mild constipation (MIX AND DRINK).    [provider]  potassium chloride (MICRO-K) 10 MEQ CR capsule Take 10 mEq by mouth in the morning.  07/17/14   [provider]  PRILOSEC OTC 20 MG tablet Take 20 mg by mouth daily before breakfast.    [provider]  Probiotic Product (ALIGN) 4 MG CAPS Take 4 mg by mouth daily as needed (to support G.I. health).    [provider]  ramipril (ALTACE) 10 MG capsule Take 10 mg by mouth in the morning.  10/06/19   [provider]  Saline (SIMPLY SALINE) 0.9 % AERS Place 1 spray into  both nostrils as needed (for congestion).    [provider]  senna (SENOKOT) 8.6 MG TABS tablet Take 1 tablet by mouth at bedtime as needed for mild constipation.     [provider]  traZODone (DESYREL) 50 MG tablet Take 25 mg by mouth at bedtime as needed for sleep. 09/08/19   [provider]    Allergies    Ciprofloxacin, Doxycycline hyclate, Penicillins, and Penicillin g  Review of Systems   Review of Systems  Unable to perform ROS: Mental status change  Constitutional: Negative for chills, diaphoresis, fatigue and fever.  HENT: Negative for congestion.   Eyes: Negative for visual disturbance.  Respiratory: Negative for cough, chest tightness, shortness of breath and wheezing.   Cardiovascular: Negative for chest pain and palpitations.  Gastrointestinal: Negative for abdominal pain, constipation, diarrhea, nausea and vomiting.  Genitourinary: Negative for flank pain.  Musculoskeletal: Negative for back pain, neck pain and neck stiffness.  Neurological: Negative for speech difficulty, weakness, light-headedness and headaches.  Psychiatric/Behavioral: Positive for confusion. Negative for agitation.  All other systems reviewed and are  negative.   Physical Exam Updated Vital Signs There were no vitals taken for this visit.  Physical Exam Vitals and nursing note reviewed.  Constitutional:      General: She is not in acute distress.    Appearance: She is well-developed. She is not ill-appearing, toxic-appearing or diaphoretic.  HENT:     Head: Normocephalic and atraumatic.     Nose: No congestion or rhinorrhea.     Mouth/Throat:     Mouth: Mucous membranes are moist.  Eyes:     Extraocular Movements: Extraocular movements intact.     Conjunctiva/sclera: Conjunctivae normal.     Pupils: Pupils are equal, round, and reactive to light.  Cardiovascular:     Rate and Rhythm: Normal rate and regular rhythm.     Heart sounds: No murmur heard.   Pulmonary:     Effort: Pulmonary effort is normal. No respiratory distress.     Breath sounds: Normal breath sounds. No wheezing, rhonchi or rales.  Chest:     Chest wall: No tenderness.  Abdominal:     General: Abdomen is flat.     Palpations: Abdomen is soft.     Tenderness: There is no abdominal tenderness. There is no right CVA tenderness, left CVA tenderness, guarding or rebound.  Musculoskeletal:        General: No tenderness.     Cervical back: Neck supple. No tenderness.  Skin:    General: Skin is warm and dry.     Capillary Refill: Capillary refill takes less than 2 seconds.     Findings: No erythema.  Neurological:     Mental Status: She is alert. She is disoriented.     Sensory: No sensory deficit.     Motor: No weakness.     ED Results / Procedures / Treatments   Labs (all labs ordered are listed, but only abnormal results are displayed) Labs Reviewed  CBC - Abnormal; Notable for the following components:      Result Value   RBC 3.77 (*)    Hemoglobin 11.9 (*)    HCT 34.8 (*)    All other components within normal limits  COMPREHENSIVE METABOLIC PANEL - Abnormal; Notable for the following components:   Sodium 128 (*)    Chloride 95 (*)    GFR,  Estimated 59 (*)    All other components within normal limits  I-STAT CHEM 8, ED -  Abnormal; Notable for the following components:   Sodium 130 (*)    Chloride 94 (*)    Glucose, Bld 103 (*)    All other components within normal limits  CBG MONITORING, ED - Abnormal; Notable for the following components:   Glucose-Capillary 112 (*)    All other components within normal limits  RESP PANEL BY RT-PCR (FLU A&B, COVID) ARPGX2  URINE CULTURE  PROTIME-INR  APTT  AMMONIA  TSH  BRAIN NATRIURETIC PEPTIDE  CBC WITH DIFFERENTIAL/PLATELET  URINALYSIS, ROUTINE W REFLEX MICROSCOPIC  HEMOGLOBIN A1C  LIPID PANEL  CK  MAGNESIUM  PHOSPHORUS  SODIUM, URINE, RANDOM  CREATININE, URINE, RANDOM  OSMOLALITY, URINE  OSMOLALITY    EKG EKG Interpretation  Date/Time:  Saturday Jul 31 2020 14:39:17 EDT Ventricular Rate:  58 PR Interval:  273 QRS Duration: 91 QT Interval:  464 QTC Calculation: 456 R Axis:   55 Text Interpretation: Sinus rhythm Prolonged PR interval Anteroseptal infarct, old Abnormal T, consider ischemia, diffuse leads when compared to prior, similar t wave inversions to prior. No STEMI Confirmed by Antony Blackbird [phone]) on 07/31/2020 3:16:31 PM   Radiology DG CHEST PORT 1 VIEW  Result Date: 07/31/2020 CLINICAL DATA:  Altered mental status.  Hypertension. EXAM: PORTABLE CHEST 1 VIEW COMPARISON:  03/27/2020, CT 10/10/2019 FINDINGS: Normal heart size. Stable mediastinal contours with aortic atherosclerosis and tortuosity. Improved bilateral pleural effusions from prior exam. No acute airspace disease. No pulmonary edema. Air-filled upper esophagus corresponds to findings on prior CT. No pneumothorax. Bones are under mineralized without acute osseous abnormality. IMPRESSION: No acute findings.  Improved pleural effusions from prior exam. Electronically Signed   By: Keith Rake M.D.   On: 07/31/2020 19:55   CT HEAD CODE STROKE WO CONTRAST  Result Date: 07/31/2020 CLINICAL DATA:   Code stroke. Neuro deficit, acute, stroke suspected. EXAM: CT HEAD WITHOUT CONTRAST TECHNIQUE: Contiguous axial images were obtained from the base of the skull through the vertex without intravenous contrast. COMPARISON:  Brain MRI 06/15/2020. head CT 06/15/2020. FINDINGS: Brain: The examination is significantly motion degraded, limiting evaluation. Moderate cerebral atrophy.  Comparatively mild cerebellar atrophy. Mild patchy and ill-defined hypoattenuation within the cerebral white matter is nonspecific, but compatible with chronic small vessel ischemic disease. Unchanged 13 mm calcified meningioma along the anterior falx. Within described limitations, no acute intracranial hemorrhage is identified. No acute demarcated cortical infarct is identified. No extra-axial fluid collection is identified. No midline shift. Vascular: No hyperdense vessel.  Atherosclerotic calcifications. Skull: Normal. Negative for fracture or focal lesion. Sinuses/Orbits: Visualized orbits show no acute finding. Frothy secretions and mild to moderate mucosal thickening within the left sphenoid sinus. Trace bilateral ethmoid sinus mucosal thickening. ASPECTS [facility] Stroke Program Early CT Score) - Ganglionic level infarction (caudate, lentiform nuclei, internal capsule, insula, M1-M3 cortex): 7 - Supraganglionic infarction (M4-M6 cortex): 3 Total score (0-10 with 10 being normal): 10 These results were communicated to Dr. Erlinda Hong At 2:43 pmon 5/14/2022by text page via the [facility] messaging system. IMPRESSION: Significantly motion degraded examination, limiting evaluation. No acute intracranial abnormality is identified. Stable atrophy and chronic small vessel ischemic disease. Unchanged 13 mm calcified meningioma along the anterior falx. Paranasal sinus disease, as described. Electronically Signed   By: Kellie Simmering DO   On: 07/31/2020 14:43    Procedures Procedures   Medications Ordered in ED Medications  aspirin tablet 325 mg (has no  administration in time range)  atorvastatin (LIPITOR) tablet 20 mg (has no administration in time range)  clopidogrel (PLAVIX) tablet  75 mg (has no administration in time range)  insulin aspart (novoLOG) injection 0-9 Units (0 Units Subcutaneous Not Given 07/31/20 1952)  sodium chloride flush (NS) 0.9 % injection 3 mL (3 mLs Intravenous Given 07/31/20 1619)  LORazepam (ATIVAN) injection 0.5 mg (0.5 mg Intravenous Given 07/31/20 2007)    ED Course  I have reviewed the triage vital signs and the nursing notes.  Pertinent labs & imaging results that were available during my care of the patient were reviewed by me and considered in my medical decision making (see chart for details).  Clinical Course as of 07/31/20 2146  Sat Jul 31, 2020  1840 MR BRAIN WO CONTRAST [CT]    Clinical Course User Index [CT] Bach Rocchi, Gwenyth Allegra, MD   MDM Rules/Calculators/A&P                          Brenda Lynch is a 87 y.o. female With a past medical history significant for dementia, hypertension, hypercholesterolemia, diabetes, CHF, and prior TIAs who presents as a code stroke for altered mental status.  According to EMS, patient was at her baseline which is normally communicative and oriented to all but time when she took a nap at 11 AM.  Patient subsequently woke up and has been confused and altered.  Per EMS, she was having some difficulty speaking during transport that that has improved.  Patient is more coherent now.  She did not have any focal deficits otherwise per EMS.  According to EMS, she has not had recent fevers, chills, chest pain, shortness of breath, urinary symptoms or GI symptoms.  No recent trauma reported.  Patient examined by me on arrival and airway is intact.  Breath sounds equal bilateral initially.  Patient taken to CT scanner and further evaluated by neurology.  Further exam on reassessment revealed normal sensation and strength in extremities.  Symmetric smile.  Clear speech for me.   Pupil symmetric and reactive with normal extraocular movements.  Lungs clear and chest nontender.  Abdomen nontender.  Patient otherwise well-appearing..   Neurology will help direct work-up but they reports she would likely admission for TIA work-up as her symptoms start to be improving.  We will also get more metabolic work-up to look for a occult infection in the urine, chest, or other acute abnormalities that could cause altered mental status in this 87 year old.  Anticipate admission after work-up is completed.  On reassessment, patient is acting more like her normal self per son.  Neurology reports to me they feel she needs admission for TIA/stroke work-up.  CT head shows no acute intracranial abnormality identified.  Due to the transient disorientation and altered mental status, neurology recommended admission.  Medicine team called for admission.   Final Clinical Impression(s) / ED Diagnoses Final diagnoses:  Altered awareness, transient    Clinical Impression: 1. Altered awareness, transient   2. TIA (transient ischemic attack)     Disposition: Admit  This note was prepared with assistance of Dragon voice recognition software. Occasional wrong-word or sound-a-like substitutions may have occurred due to the inherent limitations of voice recognition software.     Rochell Mabie, Gwenyth Allegra, MD 07/31/20 2149

## 2020-07-31 NOTE — ED Notes (Signed)
Pt returned from MRI °

## 2020-07-31 NOTE — ED Notes (Signed)
Pt refuses purwick

## 2020-07-31 NOTE — ED Notes (Signed)
Pt became upset stating that we are trying to kill her. That she is a diabetic and we are trying to kill her. That she is choking on something and she can't breath. That she is a diabetic and we need to give her some honey or something for her diabetes. Son came into the room to help calm her down. And provider sent a message

## 2020-07-31 NOTE — ED Notes (Signed)
Pt transported to MRI 

## 2020-07-31 NOTE — Consult Note (Signed)
Stroke Neurology Consultation Note  Consult Requested by: Dr. Sherry Ruffing  Reason for Consult: Code stroke  Consult Date: 07/31/20   The history was obtained from the EMS.  During history and examination, all items were not able to obtain unless otherwise noted.  History of Present Illness:  Brenda Lynch is a 87 y.o. African American female with PMH of CHF, CKD, CAD, DM II, HLD, dementia, and HTN who presented as a Code Stroke.  Per EMS, patient went to have a nap at 11 AM, woke up at 2 PM and the family found patient not up to answer questions, not follow commands, confused.  EMS was called.  On arrival, patient not talking much, still not follow commands, no lateralizing weakness found however, BP 160/88, glucose 134.  Code stroke activated.  On ED arrival, patient more awake alert, much more conversant and follow simple commands.  No focal deficits found.  CT no acute abnormality.  Patient was admitted 04/2020 for episode of glazed gaze, aphasia, right facial droop and slurred speech.  Symptoms resolved on ED arrival.  CT no acute finding, CTA head and neck chronic right P1 occlusion and left P1 and P2/P3 moderate stenosis as well as left SCA high-grade stenosis.  EEG no seizure.  MRI no acute infarct.  Concerning for TIA versus seizure.  She had a 14-day CardioNet monitoring in 05/2019 no A. fib.  She was discharged with aspirin Plavix for 3 weeks and Lipitor 20.  LSN: 11 AM tPA Given: No: No focal deficit, likely not stroke  Past Medical History:  Diagnosis Date  . Allergic rhinitis   . Chronic diastolic CHF (congestive heart failure) ([place])   . CKD (chronic kidney disease), stage II   . Coronary artery disease   . Diabetes mellitus   . Diastolic dysfunction   . Diverticulosis   . Drug-induced bradycardia 01/28/2016  . Hypercholesterolemia   . Hypertension   . Hyponatremia   . Meningioma ([place])   . Osteoarthritis   . Sedentary lifestyle   . Spinal stenosis   . Vertigo, benign  positional     Past Surgical History:  Procedure Laterality Date  . BACK SURGERY    . BREAST BIOPSY    . SKIN FULL THICKNESS GRAFT Right 11/08/2017   Procedure: RIGHT SMALL FINGER ABLATION OF NAIL ANDSKIN GRAFT FULL THICKNESS;  Surgeon: Leanora Cover, MD;  Location: [facility];  Service: Orthopedics;  Laterality: Right;  . TONSILLECTOMY      Family History  Problem Relation Age of Onset  . Heart disease Mother   . Heart attack Mother   . Prostate cancer Father   . Lung cancer Brother   . Pancreatic cancer Brother   . Headache Neg Hx     Social History:  reports that she quit smoking about 41 years ago. She has never used smokeless tobacco. She reports that she does not drink alcohol and does not use drugs.  Allergies:  Allergies  Allergen Reactions  . Ciprofloxacin Nausea Only and Other (See Comments)    GI intolerance  . Doxycycline Hyclate     Other reaction(s): BS elev, Head pressure  . Penicillins Hives and Other (See Comments)    Welts, also Has patient had a PCN reaction causing immediate rash, facial/tongue/throat swelling, SOB or lightheadedness with hypotension: Yes Has patient had a PCN reaction causing severe rash involving mucus membranes or skin necrosis: No Has patient had a PCN reaction that required hospitalization No Has patient had a  PCN reaction occurring within the last 10 years: No If all of the above answers are "NO", then may proceed with Cephalosporin use.  Marland Kitchen Penicillin G Hives and Other (See Comments)    Welts on the body    No current facility-administered medications on file prior to encounter.   Current Outpatient Medications on File Prior to Encounter  Medication Sig Dispense Refill  . acetaminophen (TYLENOL) 500 MG tablet Take 500 mg by mouth every 6 (six) hours as needed (for headaches or mild pain).    Marland Kitchen aspirin 325 MG tablet Take 1 tablet (325 mg total) by mouth daily. 21 tablet 0  . atorvastatin (LIPITOR) 20 MG tablet Take  1 tablet (20 mg total) by mouth daily. 30 tablet 1  . calcium carbonate (TUMS - DOSED IN MG ELEMENTAL CALCIUM) 500 MG chewable tablet Chew 1 tablet by mouth daily as needed for indigestion or heartburn.    . calcium citrate-vitamin D (CITRACAL+D) 315-200 MG-UNIT per tablet Take 1 tablet by mouth daily.    . cetirizine (ZYRTEC) 10 MG tablet Take 10 mg by mouth at bedtime as needed for allergies or rhinitis.     . Chlorphen-Phenyleph-APAP (CORICIDIN D COLD/FLU/SINUS) 2-5-325 MG TABS Take 1 capsule by mouth every 6 (six) hours as needed (for symptoms).    . Cholecalciferol (VITAMIN D3) 25 MCG (1000 UT) CAPS Take 1,000 Units by mouth daily.    . clopidogrel (PLAVIX) 75 MG tablet Take 1 tablet (75 mg total) by mouth daily. 30 tablet 3  . clotrimazole (LOTRIMIN) 1 % cream Apply to both feet and between toes twice daily (Patient taking differently: Apply 1 application topically See admin instructions. Apply to both feet and between toes twice daily) 30 g 1  . diphenhydrAMINE (BENADRYL) 25 MG tablet Take 25 mg by mouth every 6 (six) hours as needed for allergies.    Marland Kitchen estradiol (ESTRACE) 0.1 MG/GM vaginal cream Place 1 Applicatorful vaginally 2 (two) times a week. Monday and Friday     . fluticasone (FLONASE) 50 MCG/ACT nasal spray Place 1-2 sprays into both nostrils daily as needed for allergies or rhinitis.    . hydrochlorothiazide (MICROZIDE) 12.5 MG capsule TAKE (1) CAPSULE DAILY. (Patient taking differently: Take 12.5 mg by mouth daily.) 30 capsule 11  . hydrocortisone (ANUSOL-HC) 2.5 % rectal cream Place 1 application rectally 2 (two) times daily as needed for hemorrhoids or anal itching.    . hydrocortisone (ANUSOL-HC) 25 MG suppository Place 25 mg rectally 2 (two) times daily as needed for hemorrhoids or itching.     . hydrOXYzine (ATARAX/VISTARIL) 25 MG tablet Take 25 mg by mouth at bedtime.    . Lancets (ONETOUCH ULTRASOFT) lancets 1 each by Other route as directed.     . Misc Natural Products  (OSTEO BI-FLEX ADV DOUBLE ST PO) Take 1 tablet by mouth daily.    . montelukast (SINGULAIR) 10 MG tablet Take 10 mg by mouth at bedtime.    . ONE TOUCH ULTRA TEST test strip 1 each by Other route as directed.     Vladimir Faster Glyc-Propyl Glyc PF (SYSTANE HYDRATION PF) 0.4-0.3 % SOLN Place 1 drop into both eyes 3 (three) times daily as needed (for irritation).    . polyethylene glycol powder (GLYCOLAX/MIRALAX) 17 GM/SCOOP powder Take 8.5-17 g by mouth daily as needed for mild constipation (MIX AND DRINK).    . potassium chloride (MICRO-K) 10 MEQ CR capsule Take 10 mEq by mouth in the morning.     Marland Kitchen [place]  OTC 20 MG tablet Take 20 mg by mouth daily before breakfast.    . Probiotic Product (ALIGN) 4 MG CAPS Take 4 mg by mouth daily as needed (to support G.I. health).    . ramipril (ALTACE) 10 MG capsule Take 10 mg by mouth in the morning.     . Saline (SIMPLY SALINE) 0.9 % AERS Place 1 spray into both nostrils as needed (for congestion).    Marland Kitchen senna (SENOKOT) 8.6 MG TABS tablet Take 1 tablet by mouth at bedtime as needed for mild constipation.     . traZODone (DESYREL) 50 MG tablet Take 25 mg by mouth at bedtime as needed for sleep.      Review of Systems: A full ROS was attempted today and was able to be performed.  Systems assessed include - Constitutional, Eyes, HENT, Respiratory, Cardiovascular, Gastrointestinal, Genitourinary, Integument/breast, Hematologic/lymphatic, Musculoskeletal, Neurological, Behavioral/Psych, Endocrine, Allergic/Immunologic - with pertinent responses as per HPI.  Physical Examination:    General - well nourished, well developed, in no apparent distress.    Ophthalmologic - fundi not visualized due to noncooperation.    Cardiovascular - regular rhythm and rate  Neuro - awake, alert, hard of hearing, eyes open, orientated to age, place, and people but not to time. No aphasia, paucity of speech, following most simple commands. Able to name 2/3 and repeat 5-word  sentences. No gaze palsy, tracking bilaterally, blinking to visual threat bilaterally, PERRL. No facial droop. Tongue midline. Bilateral UEs 4/5, no drift. Bilaterally LEs 4/5, no drift. Sensation symmetrical bilaterally subjectively, b/l FTN grossly intact, gait not tested.   NIH Stroke Scale  Level Of Consciousness 0=Alert; keenly responsive 1=Arouse to minor stimulation 2=Requires repeated stimulation to arouse or movements to pain 3=postures or unresponsive 0  LOC Questions to Month and Age 79=Answers both questions correctly 1=Answers one question correctly or dysarthria/intubated/trauma/language barrier 2=Answers neither question correctly or aphasia 1  LOC Commands      -Open/Close eyes     -Open/close grip     -Pantomime commands if communication barrier 0=Performs both tasks correctly 1=Performs one task correctly 2=Performs neighter task correctly 0  Best Gaze     -Only assess horizontal gaze 0=Normal 1=Partial gaze palsy 2=Forced deviation, or total gaze paresis 0  Visual 0=No visual loss 1=Partial hemianopia 2=Complete hemianopia 3=Bilateral hemianopia (blind including cortical blindness) 0  Facial Palsy     -Use grimace if obtunded 0=Normal symmetrical movement 1=Minor paralysis (asymmetry) 2=Partial paralysis (lower face) 3=Complete paralysis (upper and lower face) 0  Motor  0=No drift for 10/5 seconds 1=Drift, but does not hit bed 2=Some antigravity effort, hits  bed 3=No effort against gravity, limb falls 4=No movement 0=Amputation/joint fusion Right Arm 0     Leg 0    Left Arm 0     Leg 0  Limb Ataxia     - FNT/HTS 0=Absent or does not understand or paralyzed or amputation/joint fusion 1=Present in one limb 2=Present in two limbs 0  Sensory 0=Normal 1=Mild to moderate sensory loss 2=Severe to total sensory loss or coma/unresponsive 0  Best Language 0=No aphasia, normal 1=Mild to moderate aphasia 2=Severe aphasia 3=Mute, global aphasia, or  coma/unresponsive 0  Dysarthria 0=Normal 1=Mild to moderate 2=Severe, unintelligible or mute/anarthric 0=intubated/unable to test 1  Extinction/Neglect 0=No abnormality 1=visual/tactile/auditory/spatia/personal inattention/Extinction to bilateral simultaneous stimulation 2=Profound neglect/extinction more than 1 modality  0  Total   2      Data Reviewed: No results found.  Assessment: 87 y.o. female with PMH of CHF,  CKD, CAD, DM II, HLD, dementia, and HTN who presented not able to answer questions, not follow commands, confused after a nap.  Last seen well 11 AM.  Woke up at 2 PM with symptoms. On ED arrival, patient more awake alert, much more conversant and follow simple commands.  No focal deficits found.  NIH score 2.  CT no acute abnormality.  Patient not a tPA candidate given nonfocal deficit, likely not stroke.  Etiology for patient's symptoms concerning for encephalopathy after nap, however TIA and seizure are still in differential.  Recommend further work-up with MRI and MRA.  Routine EEG.  CBC, CMP, UA, ammonia level.  Stroke Risk Factors - hyperlipidemia, hypertension and CHF and CAD  Plan: - Continue further work up  - Frequent neuro checks - Telemetry monitoring - MRI brain  - MRA head with carotid doppler - Echocardiogram  - Routine EEG - UDS, fasting lipid panel and HgbA1C, UA, ammonia level, CBC, CMP - PT/OT/speech consult - Permissive hypertension (only treat if BP > 220/120 unless a lower blood pressure is clinically necessary) for 24-48 hours post stroke onset - GI and DVT prophylaxis  - Continue home aspirin Plavix if passes swallow - Stroke risk factor modification - Discussed with EDP Dr. Sherry Ruffing - We will follow   Thank you for this consultation and allowing [place] to participate in the care of this patient.  Rosalin Hawking, MD PhD Stroke Neurology 07/31/2020 2:58 PM

## 2020-07-31 NOTE — ED Notes (Signed)
Pt being moved to ed 21 at this time

## 2020-07-31 NOTE — Progress Notes (Signed)
Pt became aggitated and combative during MRI scan, I was only able to get limited images on mri head.

## 2020-07-31 NOTE — ED Notes (Signed)
Received verbal report from Jacklyn S RN 

## 2020-08-01 ENCOUNTER — Observation Stay (HOSPITAL_COMMUNITY): Payer: Medicare PPO

## 2020-08-01 ENCOUNTER — Observation Stay (HOSPITAL_BASED_OUTPATIENT_CLINIC_OR_DEPARTMENT_OTHER): Payer: Medicare PPO

## 2020-08-01 DIAGNOSIS — G9340 Encephalopathy, unspecified: Secondary | ICD-10-CM | POA: Diagnosis not present

## 2020-08-01 DIAGNOSIS — E785 Hyperlipidemia, unspecified: Secondary | ICD-10-CM | POA: Diagnosis not present

## 2020-08-01 DIAGNOSIS — E871 Hypo-osmolality and hyponatremia: Secondary | ICD-10-CM | POA: Diagnosis not present

## 2020-08-01 DIAGNOSIS — Z8673 Personal history of transient ischemic attack (TIA), and cerebral infarction without residual deficits: Secondary | ICD-10-CM

## 2020-08-01 DIAGNOSIS — I10 Essential (primary) hypertension: Secondary | ICD-10-CM | POA: Diagnosis not present

## 2020-08-01 DIAGNOSIS — G459 Transient cerebral ischemic attack, unspecified: Secondary | ICD-10-CM | POA: Diagnosis not present

## 2020-08-01 DIAGNOSIS — I5032 Chronic diastolic (congestive) heart failure: Secondary | ICD-10-CM | POA: Diagnosis not present

## 2020-08-01 DIAGNOSIS — E1169 Type 2 diabetes mellitus with other specified complication: Secondary | ICD-10-CM | POA: Diagnosis not present

## 2020-08-01 DIAGNOSIS — F0390 Unspecified dementia without behavioral disturbance: Secondary | ICD-10-CM | POA: Diagnosis not present

## 2020-08-01 DIAGNOSIS — R404 Transient alteration of awareness: Secondary | ICD-10-CM | POA: Diagnosis not present

## 2020-08-01 LAB — GLUCOSE, CAPILLARY
Glucose-Capillary: 157 mg/dL — ABNORMAL HIGH (ref 70–99)
Glucose-Capillary: 107 mg/dL — ABNORMAL HIGH (ref 70–99)
Glucose-Capillary: 137 mg/dL — ABNORMAL HIGH (ref 70–99)

## 2020-08-01 LAB — MAGNESIUM: Magnesium: 1.8 mg/dL (ref 1.7–2.4)

## 2020-08-01 LAB — LIPID PANEL
Cholesterol: 118 mg/dL (ref 0–200)
HDL: 44 mg/dL (ref >40)
LDL Cholesterol: 65 mg/dL (ref 0–99)
Total CHOL/HDL Ratio: 2.7 RATIO
Triglycerides: 46 mg/dL (ref <150)
VLDL: 9 mg/dL (ref 0–40)

## 2020-08-01 LAB — OSMOLALITY: Osmolality: 272 mOsm/kg — ABNORMAL LOW (ref 275–295)

## 2020-08-01 LAB — CK: Total CK: 52 U/L (ref 38–234)

## 2020-08-01 LAB — HEMOGLOBIN A1C
Hgb A1c MFr Bld: 6.4 % — ABNORMAL HIGH (ref 4.8–5.6)
Mean Plasma Glucose: 136.98 mg/dL

## 2020-08-01 LAB — PHOSPHORUS: Phosphorus: 3.3 mg/dL (ref 2.5–4.6)

## 2020-08-01 MED ORDER — CIPROFLOXACIN IN D5W 400 MG/200ML IV SOLN: 400.0000 mg | Freq: Two times a day (BID) | INTRAVENOUS | Status: DC

## 2020-08-01 MED ORDER — MONTELUKAST SODIUM 10 MG PO TABS
10.0000 mg | ORAL_TABLET | Freq: Every day | ORAL | Status: DC
  Administered 2020-08-01: 10 mg via ORAL
  Filled 2020-08-01: qty 1

## 2020-08-01 MED ORDER — TRAZODONE HCL 50 MG PO TABS
25.0000 mg | ORAL_TABLET | Freq: Every day | ORAL | Status: DC
  Administered 2020-08-01: 25 mg via ORAL
  Filled 2020-08-01: qty 1

## 2020-08-01 MED ORDER — SODIUM CHLORIDE 0.9 % IV SOLN
2.0000 g | INTRAVENOUS | Status: DC
  Administered 2020-08-01 – 2020-08-02 (×2): 2 g via INTRAVENOUS
  Filled 2020-08-01: qty 2
  Filled 2020-08-01: qty 20

## 2020-08-01 NOTE — Progress Notes (Signed)
SLP Cancellation Note  Patient Details Name: IVEY NEMBHARD MRN: [id number] DOB: [birth year]   Cancelled treatment:       Reason Eval/Treat Not Completed: SLP screened, no needs identified, will sign off. Passed RN swallow screen and at baseline per son.   Gabriel Rainwater MA, CCC-SLP     Curtistine Pettitt Meryl 08/01/2020, 9:43 AM

## 2020-08-01 NOTE — Progress Notes (Signed)
[facility]  PROGRESS NOTE  [name] GDJ:[id number] DOB: [birth year] DOA: 07/31/2020 PCP: [name], MD   Brief HPI:   87 year old female with medical history of CHF, CKD, CAD, diabetes mellitus type 2, hyperlipidemia, dementia, hypertension presented to ED with complaints of confusion after she woke up from having a nap at home.  As per son she has been having the symptoms intermittently for past few months.  Patient would just have staring spells, still nonverbal which lasts usually for 1 hour. Neurology was consulted,   Subjective   Patient seen and examined, MRI was negative for stroke.  EEG showed finding suggestive of cortical dysfunction present from left temporal region, nonspecific etiology.   Assessment/Plan:     1. TIA versus seizure-neurology was consulted, work-up underway.  MRI brain is negative for stroke.  Carotid duplex showed no significant stenosis in both carotid arteries.  Antegrade flow in the vertebral arteries.  Echocardiogram from March 2022 showed EF 65 to 70%.  Also showed grade 1 diastolic dysfunction.  EEG showed findings suggestive of cortical dysfunction present in the left temporal region.  Neurology following.  Continue aspirin, atorvastatin, Plavix. 2. Staring spell/encephalopathy-patient is having episodes of staring spells usually after taking a nap intermittently for past few months.  She is on multiple sedating medications including Benadryl, hydroxyzine, trazodone, chlorphentermine.  This is likely polypharmacy contributing to patient's encephalopathy.  I discussed with patient's son and recommended to stop Benadryl, chlorphentermine, hydroxyzine.  EEG shows findings suggestive of cortical dysfunction from the left temporal region.  No seizures or epileptiform discharges seen throughout recording. 3. UTI-patient has abnormal UA, urine culture has been obtained.  We will start ceftriaxone empirically.  Follow urine culture  results. 4. Diabetes mellitus type 2-continue sliding scale insulin with NovoLog.  CBG well controlled. 5. Dementia-without behavioral disturbance.  Stable 6. Chronic diastolic CHF-euvolemic 7. Hyponatremia-sodium was 128, started on gentle IV hydration with normal saline.  Follow serum sodium level in a.m.   Scheduled medications:   . aspirin  325 mg Oral Daily  . atorvastatin  20 mg Oral Daily  . clopidogrel  75 mg Oral Daily  . insulin aspart  0-9 Units Subcutaneous TID WC         Data Reviewed:   CBG:  Recent Labs  Lab 07/31/20 1943 08/01/20 0749 08/01/20 1140  GLUCAP 112* 157* 107*    SpO2: 98 %    Vitals:   08/01/20 0130 08/01/20 0200 08/01/20 0243 08/01/20 1405  BP: (!) 152/72 (!) 137/109 (!) 147/80 (!) 158/62  Pulse: (!) 50 (!) 52 (!) 52 60  Resp: 13 18 18 17   Temp:   98.1 F (36.7 C) 98.2 F (36.8 C)  TempSrc:      SpO2: 98% 98% 93% 98%     Intake/Output Summary (Last 24 hours) at 08/01/2020 1531 Last data filed at 07/31/2020 2249 Gross per 24 hour  Intake --  Output 1500 ml  Net -1500 ml    05/13 1901 - 05/15 0700 In: -  Out: 1500 [Urine:1500]  There were no vitals filed for this visit.  CBC:  Recent Labs  Lab 07/31/20 1422 07/31/20 1640 07/31/20 1800  WBC  --  8.0 7.4  HGB 12.6 11.9* 12.2  HCT 37.0 34.8* 37.3  PLT  --  272 320  MCV  --  92.3 92.8  MCH  --  31.6 30.3  MCHC  --  34.2 32.7  RDW  --  13.2 13.1  LYMPHSABS  --   --  1.9  MONOABS  --   --  0.5  EOSABS  --   --  0.1  BASOSABS  --   --  0.1    Complete metabolic panel:  Recent Labs  Lab 07/31/20 1422 07/31/20 1432 07/31/20 1433 07/31/20 1730 07/31/20 1750 08/01/20 0358  NA 130*  --   --  128*  --   --   K 4.0  --   --  3.8  --   --   CL 94*  --   --  95*  --   --   CO2  --   --   --  25  --   --   GLUCOSE 103*  --   --  99  --   --   BUN 12  --   --  11  --   --   CREATININE 0.90  --   --  0.90  --   --   CALCIUM  --   --   --  9.1  --   --   AST  --    --   --  18  --   --   ALT  --   --   --  11  --   --   ALKPHOS  --   --   --  85  --   --   BILITOT  --   --   --  0.7  --   --   ALBUMIN  --   --   --  3.5  --   --   MG  --   --   --   --   --  1.8  INR  --   --   --   --  1.0  --   TSH  --  0.747  --   --   --   --   HGBA1C  --   --   --   --   --  6.4*  AMMONIA  --  17  --   --   --   --   BNP  --   --  86.0  --   --   --     No results for input(s): LIPASE, AMYLASE in the last 168 hours.  Recent Labs  Lab 07/31/20 1433 07/31/20 1734  BNP 86.0  --   SARSCOV2NAA  --  NEGATIVE    ------------------------------------------------------------------------------------------------------------------ Recent Labs    08/01/20 0359  CHOL 118  HDL 44  LDLCALC 65  TRIG 46  CHOLHDL 2.7    Lab Results  Component Value Date   HGBA1C 6.4 (H) 08/01/2020   ------------------------------------------------------------------------------------------------------------------ Recent Labs    07/31/20 1432  TSH 0.747   ------------------------------------------------------------------------------------------------------------------ No results for input(s): VITAMINB12, FOLATE, FERRITIN, TIBC, IRON, RETICCTPCT in the last 72 hours.  Coagulation profile Recent Labs  Lab 07/31/20 1750  INR 1.0   No results for input(s): DDIMER in the last 72 hours.  Cardiac Enzymes Recent Labs  Lab 08/01/20 0358  CKTOTAL 52    ------------------------------------------------------------------------------------------------------------------    Component Value Date/Time   BNP 86.0 07/31/2020 1433     Antibiotics: Anti-infectives (From admission, onward)   Start     Dose/Rate Route Frequency Ordered Stop   08/01/20 1015  cefTRIAXone (ROCEPHIN) 2 g in sodium chloride 0.9 % 100 mL IVPB        2 g 200 mL/hr over 30 Minutes Intravenous Every 24 hours 08/01/20 0916  08/01/20 1000  ciprofloxacin (CIPRO) IVPB 400 mg  Status:  Discontinued         400 mg 200 mL/hr over 60 Minutes Intravenous Every 12 hours 08/01/20 0901 08/01/20 0915       Radiology Reports  MR BRAIN WO CONTRAST  Result Date: 07/31/2020 CLINICAL DATA:  Initial evaluation for acute TIA. EXAM: MRI HEAD WITHOUT CONTRAST TECHNIQUE: Multiplanar, multiecho pulse sequences of the brain and surrounding structures were obtained without intravenous contrast. COMPARISON:  Prior head CT from earlier the same day. FINDINGS: Brain: Examination technically limited as the patient was unable to tolerate the full length of the study. Additionally, images are mildly degraded by motion artifact. Generalized age-related cerebral atrophy. Confluent T2/FLAIR hyperintensity within the periventricular white matter most likely related chronic microvascular ischemic disease, mild in nature. There is a single punctate focus of apparent diffusion abnormality involving the right frontal cortex (series 5, image 79). This is not well seen on corresponding coronal diffusion-weighted sequence. While this finding is favored to reflect edge artifact, a tiny acute ischemic cortical infarct is difficult to exclude, and could be considered in the correct clinical setting. No other evidence for acute or subacute ischemia. Gray-white matter differentiation otherwise maintained. No other areas of remote cortical infarction. 13 mm calcified meningioma along the anterior falx without associated mass effect. No other mass lesion, mass effect, or midline shift. Mild ventricular prominence related to global parenchymal volume loss of hydrocephalus. No extra-axial fluid collection. Pituitary gland suprasellar region within normal limits. Midline structures intact. Vascular: Major intracranial vascular flow voids are maintained. 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 Skull and upper cervical spine: Advanced degenerative changes about the C1-2 articulation with degenerative thickening  of the tectorial membrane. Secondary mild stenosis at the craniocervical junction. Additional degenerative spondylosis noted within the upper cervical spine without high-grade stenosis. Bone marrow signal intensity within normal limits. Hyperostosis frontalis interna noted. No scalp soft tissue abnormality. Sinuses/Orbits: Patient status post bilateral ocular lens replacement. Globes and orbital soft tissues demonstrate no acute finding. Scattered mucosal thickening noted within the ethmoidal air cells and left sphenoid sinus. Paranasal sinuses are otherwise largely clear. No significant mastoid effusion. Inner ear structures grossly normal. Other: None. IMPRESSION: 1. Technically limited exam due to the patient's inability to tolerate the full length of the study. 2. Punctate focus of diffusion abnormality involving the right frontal cortex as above. While this finding is favored to reflect edge artifact, a tiny acute ischemic cortical infarct is difficult to exclude, and could be considered in the correct clinical setting. 3. No other acute intracranial abnormality. 4. 13 mm calcified meningioma along the anterior falx without associated mass effect. 5. Generalized age-related cerebral atrophy with mild chronic small vessel ischemic disease. Electronically Signed   By: Jeannine Boga M.D.   On: 07/31/2020 22:09   DG CHEST PORT 1 VIEW  Result Date: 07/31/2020 CLINICAL DATA:  Altered mental status.  Hypertension. EXAM: PORTABLE CHEST 1 VIEW COMPARISON:  03/27/2020, CT 10/10/2019 FINDINGS: Normal heart size. Stable mediastinal contours with aortic atherosclerosis and tortuosity. Improved bilateral pleural effusions from prior exam. No acute airspace disease. No pulmonary edema. Air-filled upper esophagus corresponds to findings on prior CT. No pneumothorax. Bones are under mineralized without acute osseous abnormality. IMPRESSION: No acute findings.  Improved pleural effusions from prior exam.  Electronically Signed   By: Keith Rake M.D.   On: 07/31/2020 19:55   EEG adult  Result Date: 08/01/2020 Lora Havens, MD  08/01/2020  9:49 AM Patient Name: Brenda Lynch MRN: [id number] Epilepsy Attending: Lora Havens Referring Physician/Provider: Dr Toy Baker Date: 08/01/2020 Duration: 24.45mins Patient history: 87yo F admitted for TIA vs seizure vs acute encephalopathy Level of alertness: Awake AEDs during EEG study: None Technical aspects: This EEG study was done with scalp electrodes positioned according to the 10-20 International system of electrode placement. Electrical activity was acquired at a sampling rate of 500Hz  and reviewed with a high frequency filter of 70Hz  and a low frequency filter of 1Hz . EEG data were recorded continuously and digitally stored. Description: The posterior dominant rhythm consists of 8 Hz activity of moderate voltage (25-35 uV) seen predominantly in posterior head regions, symmetric and reactive to eye opening and eye closing. EEG showed intermittent 2-3 Hz delta slowing in left temporal region. Hyperventilation and photic stimulation were not performed.   ABNORMALITY - Intermittent slow, left temporal region IMPRESSION: This study is suggestive of cortical dysfunction arising from left temporal region, nonspecific etiology. No seizures or epileptiform discharges were seen throughout the recording. Lora Havens   CT HEAD CODE STROKE WO CONTRAST  Result Date: 07/31/2020 CLINICAL DATA:  Code stroke. Neuro deficit, acute, stroke suspected. EXAM: CT HEAD WITHOUT CONTRAST TECHNIQUE: Contiguous axial images were obtained from the base of the skull through the vertex without intravenous contrast. COMPARISON:  Brain MRI 06/15/2020. head CT 06/15/2020. FINDINGS: Brain: The examination is significantly motion degraded, limiting evaluation. Moderate cerebral atrophy.  Comparatively mild cerebellar atrophy. Mild patchy and ill-defined hypoattenuation  within the cerebral white matter is nonspecific, but compatible with chronic small vessel ischemic disease. Unchanged 13 mm calcified meningioma along the anterior falx. Within described limitations, no acute intracranial hemorrhage is identified. No acute demarcated cortical infarct is identified. No extra-axial fluid collection is identified. No midline shift. Vascular: No hyperdense vessel.  Atherosclerotic calcifications. Skull: Normal. Negative for fracture or focal lesion. Sinuses/Orbits: Visualized orbits show no acute finding. Frothy secretions and mild to moderate mucosal thickening within the left sphenoid sinus. Trace bilateral ethmoid sinus mucosal thickening. ASPECTS [facility] Stroke Program Early CT Score) - Ganglionic level infarction (caudate, lentiform nuclei, internal capsule, insula, M1-M3 cortex): 7 - Supraganglionic infarction (M4-M6 cortex): 3 Total score (0-10 with 10 being normal): 10 These results were communicated to Dr. Erlinda Hong At 2:43 pmon 5/14/2022by text page via the [facility] messaging system. IMPRESSION: Significantly motion degraded examination, limiting evaluation. No acute intracranial abnormality is identified. Stable atrophy and chronic small vessel ischemic disease. Unchanged 13 mm calcified meningioma along the anterior falx. Paranasal sinus disease, as described. Electronically Signed   By: Kellie Simmering DO   On: 07/31/2020 14:43   VAS US CAROTID  Result Date: 08/01/2020 Carotid Arterial Duplex Study Patient Name:  BERNIS STECHER Laroque  Date of Exam:   08/01/2020 Medical Rec #: [id number]       Accession #:    [id number] Date of Birth: [birth year]       Patient Gender: F Patient Age:   62Y Exam Location:  [facility] Procedure:      VAS US CAROTID Referring Phys: [id number] ANASTASSIA DOUTOVA --------------------------------------------------------------------------------  Indications:       TIA. Risk Factors:      Hypertension, hyperlipidemia, Diabetes, past history of                     smoking, coronary artery disease. Other Factors:     CHF, CKD. Comparison Study:  No previous exams Performing Technologist: Rogelia Rohrer  Examination  Guidelines: A complete evaluation includes B-mode imaging, spectral Doppler, color Doppler, and power Doppler as needed of all accessible portions of each vessel. Bilateral testing is considered an integral part of a complete examination. Limited examinations for reoccurring indications may be performed as noted.  Right Carotid Findings: +----------+--------+--------+--------+------------------+------------------+           PSV cm/sEDV cm/sStenosisPlaque DescriptionComments           +----------+--------+--------+--------+------------------+------------------+ CCA Prox  52      10                                intimal thickening +----------+--------+--------+--------+------------------+------------------+ CCA Distal38      8                                 intimal thickening +----------+--------+--------+--------+------------------+------------------+ ICA Prox  40      12                                                   +----------+--------+--------+--------+------------------+------------------+ ICA Mid                                             intimal thickening +----------+--------+--------+--------+------------------+------------------+ ICA Distal42      12                                                   +----------+--------+--------+--------+------------------+------------------+ ECA       56      5                                                    +----------+--------+--------+--------+------------------+------------------+ +----------+--------+-------+----------------+-------------------+           PSV cm/sEDV cmsDescribe        Arm Pressure (mmHG) +----------+--------+-------+----------------+-------------------+ [id number]      0      Multiphasic, WNL                     +----------+--------+-------+----------------+-------------------+ +---------+--------+--+--------+-+---------+ VertebralPSV cm/s21EDV cm/s5Antegrade +---------+--------+--+--------+-+---------+  Left Carotid Findings: +----------+--------+--------+--------+------------------+------------------+           PSV cm/sEDV cm/sStenosisPlaque DescriptionComments           +----------+--------+--------+--------+------------------+------------------+ CCA Prox  83      13                                intimal thickening +----------+--------+--------+--------+------------------+------------------+ CCA Distal67      16              hypoechoic        intimal thickening +----------+--------+--------+--------+------------------+------------------+ ICA Prox  56      13  tortuous           +----------+--------+--------+--------+------------------+------------------+ ICA Distal46      12                                                   +----------+--------+--------+--------+------------------+------------------+ ECA       51      8                                                    +----------+--------+--------+--------+------------------+------------------+ +----------+--------+--------+----------------+-------------------+           PSV cm/sEDV cm/sDescribe        Arm Pressure (mmHG) +----------+--------+--------+----------------+-------------------+ [id number]      0       Multiphasic, WNL                    +----------+--------+--------+----------------+-------------------+ +---------+--------+--+--------+--+---------+ VertebralPSV cm/s37EDV cm/s10Antegrade +---------+--------+--+--------+--+---------+   Summary: Right Carotid: The extracranial vessels were near-normal with only minimal wall                thickening or plaque. Left Carotid: The extracranial vessels were near-normal with only minimal wall               thickening  or plaque. Vertebrals:  Bilateral vertebral arteries demonstrate antegrade flow. Subclavians: Normal flow hemodynamics were seen in bilateral subclavian              arteries. *See table(s) above for measurements and observations.     Preliminary       DVT prophylaxis: SCDs  Code Status: DNR  Family Communication: Discussed with patient's son at bedside   Consultants:  Neurology  Procedures:  EEG  Carotid duplex    Objective    Physical Examination:   General-appears in no acute distress Heart-S1-S2, regular, no murmur auscultated Lungs-clear to auscultation bilaterally, no wheezing or crackles auscultated Abdomen-soft, nontender, no organomegaly Extremities-no edema in the lower extremities Neuro-alert, oriented x3, no focal deficit noted  Status is: Inpatient  Dispo: The patient is from: Home              Anticipated d/c is to: Home              Anticipated d/c date is: 08/03/2020              Patient currently not stable for discharge  Barrier to discharge-ongoing evaluation for encephalopathy  COVID-19 Labs  No results for input(s): DDIMER, FERRITIN, LDH, CRP in the last 72 hours.  Lab Results  Component Value Date   [place] NEGATIVE 07/31/2020   [place] NEGATIVE 06/09/2020    Microbiology  Recent Results (from the past 240 hour(s))  Resp Panel by RT-PCR (Flu A&B, Covid) Nasopharyngeal Swab     Status: None   Collection Time: 07/31/20  5:34 PM   Specimen: Nasopharyngeal Swab; Nasopharyngeal(NP) swabs in vial transport medium  Result Value Ref Range Status   SARS Coronavirus 2 by RT PCR NEGATIVE NEGATIVE Final    Comment: (NOTE) SARS-CoV-2 target nucleic acids are NOT DETECTED.  The SARS-CoV-2 RNA is generally detectable in upper respiratory specimens during the acute phase of infection. The lowest concentration of SARS-CoV-2 viral copies this assay can detect is 138  copies/mL. A negative result does not preclude SARS-Cov-2 infection  and should not be used as the sole basis for treatment or other patient management decisions. A negative result may occur with  improper specimen collection/handling, submission of specimen other than nasopharyngeal swab, presence of viral mutation(s) within the areas targeted by this assay, and inadequate number of viral copies(<138 copies/mL). A negative result must be combined with clinical observations, patient history, and epidemiological information. The expected result is Negative.  Fact Sheet for Patients:  EntrepreneurPulse.com.au  Fact Sheet for Healthcare Providers:  IncredibleEmployment.be  This test is no t yet approved or cleared by the [place] FDA and  has been authorized for detection and/or diagnosis of SARS-CoV-2 by FDA under an Emergency Use Authorization (EUA). This EUA will remain  in effect (meaning this test can be used) for the duration of the COVID-19 declaration under Section 564(b)(1) of the Act, 21 U.S.C.section 360bbb-3(b)(1), unless the authorization is terminated  or revoked sooner.       Influenza A by PCR NEGATIVE NEGATIVE Final   Influenza B by PCR NEGATIVE NEGATIVE Final    Comment: (NOTE) The Xpert Xpress SARS-CoV-2/FLU/RSV plus assay is intended as an aid in the diagnosis of influenza from Nasopharyngeal swab specimens and should not be used as a sole basis for treatment. Nasal washings and aspirates are unacceptable for Xpert Xpress SARS-CoV-2/FLU/RSV testing.  Fact Sheet for Patients: EntrepreneurPulse.com.au  Fact Sheet for Healthcare Providers: IncredibleEmployment.be  This test is not yet approved or cleared by the [place] FDA and has been authorized for detection and/or diagnosis of SARS-CoV-2 by FDA under an Emergency Use Authorization (EUA). This EUA will remain in effect (meaning this test can be used) for the duration of the COVID-19 declaration  under Section 564(b)(1) of the Act, 21 U.S.C. section 360bbb-3(b)(1), unless the authorization is terminated or revoked.  Performed at [facility], [place] [address] [place], [place] [address]              [facility] If 7PM-7AM, please contact night-coverage at [email or web address], Office  [phone]   08/01/2020, 3:31 PM  LOS: 0 days

## 2020-08-01 NOTE — Care Management Obs Status (Signed)
[place] NOTIFICATION   Patient Details  Name: Brenda Lynch MRN: [id number] Date of Birth: [birth year]   Medicare Observation Status Notification Given:  Yes    Carles Collet, RN 08/01/2020, 5:06 PM

## 2020-08-01 NOTE — ED Notes (Signed)
Attempted report at this time.

## 2020-08-01 NOTE — Progress Notes (Signed)
Carotid duplex has been completed.  Results can be found under chart review under CV PROC. 08/01/2020 2:28 PM Mackenize Delgadillo RVT, RDMS

## 2020-08-01 NOTE — Progress Notes (Signed)
Cipro was ordered for coverage for intra-abdominal infection. D/w Dr Darrick Meigs and we will change to ceftriaxone instead since PCN allergy was well over 39yrs ago and the crossreactivity to ceftriaxone is low.  Onnie Boer, PharmD, BCIDP, AAHIVP, CPP Infectious Disease Pharmacist 08/01/2020 9:20 AM

## 2020-08-01 NOTE — Procedures (Signed)
Patient Name: Brenda Lynch  MRN: [id number]  Epilepsy Attending: Lora Havens  Referring Physician/Provider: Dr Toy Baker Date: 08/01/2020 Duration: 24.71mins  Patient history: 87yo F admitted for TIA vs seizure vs acute encephalopathy  Level of alertness: Awake  AEDs during EEG study: None  Technical aspects: This EEG study was done with scalp electrodes positioned according to the 10-20 International system of electrode placement. Electrical activity was acquired at a sampling rate of 500Hz  and reviewed with a high frequency filter of 70Hz  and a low frequency filter of 1Hz . EEG data were recorded continuously and digitally stored.   Description: The posterior dominant rhythm consists of 8 Hz activity of moderate voltage (25-35 uV) seen predominantly in posterior head regions, symmetric and reactive to eye opening and eye closing. EEG showed intermittent 2-3 Hz delta slowing in left temporal region. Hyperventilation and photic stimulation were not performed.     ABNORMALITY - Intermittent slow, left temporal region  IMPRESSION: This study is suggestive of cortical dysfunction arising from left temporal region, nonspecific etiology. No seizures or epileptiform discharges were seen throughout the recording.  [name] Barbra Sarks

## 2020-08-01 NOTE — Progress Notes (Signed)
OT Cancellation Note  Patient Details Name: ETHYLENE REZNICK MRN: [id number] DOB: 10/27/1923   Cancelled Treatment:    Reason Eval/Treat Not Completed: OT screened, no needs identified, will sign off Per PT, pt's son reports pt is at baseline functioning. Pt has plenty of support at home. No OT needs identified at this time. OT will sign off. Thank you for referral.   Helene Kelp OTR/L Acute Rehabilitation Services Office: [place] 08/01/2020, 10:39 AM

## 2020-08-01 NOTE — Evaluation (Addendum)
Physical Therapy Evaluation and Discharge Patient Details Name: Brenda Lynch MRN: [id number] DOB: Nov 07, 1923 Today's Date: 08/01/2020   History of Present Illness  Brenda Lynch is a 87 y.o. female with medical history significant of CHF, CKD, CAD, DM II, HLD, dementia, and HTN, admitted for TIA.  Clinical Impression  Patient evaluated by Physical Therapy with no further acute PT needs identified. Son present, very supportive and involved with pt care. She will have assistance from family at home, as needed. Declines further PT services (recently completed HHPT.) Son active with PT evaluation and reports pt back to baseline level of mobility. All education has been completed and the patient has no further questions.PT is signing off. Thank you for this referral.     Follow Up Recommendations No PT follow up;Supervision for mobility/OOB    Equipment Recommendations  None recommended by PT    Recommendations for Other Services       Precautions / Restrictions Precautions Precautions: Fall Precaution Comments: HOH Restrictions Weight Bearing Restrictions: No      Mobility  Bed Mobility Overal bed mobility: Needs Assistance Bed Mobility: Supine to Sit     Supine to sit: Supervision     General bed mobility comments: supervision for safety. VC to technique and to facilitate OOB.    Transfers Overall transfer level: Needs assistance Equipment used: None Transfers: Sit to/from Stand Sit to Stand: Supervision         General transfer comment: sit<>stand from bed with no device. Showing posterior lean initially second attempt no instability. sueprvision for safety.  Ambulation/Gait Ambulation/Gait assistance: Supervision Gait Distance (Feet): 100 Feet Assistive device: Rolling walker (2 wheeled) Gait Pattern/deviations: Step-through pattern Gait velocity: decreased   General Gait Details: Slower speed, steady with use of RW for support. Supervision for safety. No  instability noted while using device. Cues for turning with device.  Stairs            Wheelchair Mobility    Modified Rankin (Stroke Patients Only) Modified Rankin (Stroke Patients Only) Pre-Morbid Rankin Score: Moderately severe disability Modified Rankin: Moderately severe disability     Balance Overall balance assessment: Needs assistance Sitting-balance support: No upper extremity supported;Feet supported Sitting balance-Leahy Scale: Good     Standing balance support: No upper extremity supported;During functional activity Standing balance-Leahy Scale: Fair                               Pertinent Vitals/Pain Pain Assessment: No/denies pain    Home Living Family/patient expects to be discharged to:: Private residence Living Arrangements: Children Available Help at Discharge: Family;Available 24 hours/day;Personal care attendant;Other (Comment) (will be new to have PCA for bathe/dress) Type of Home: House Home Access: Stairs to enter Entrance Stairs-Rails: Can reach both Entrance Stairs-Number of Steps: 2 steps from [place]: Multi-level;Able to live on main level with bedroom/bathroom Home Equipment: Gilford Rile - 2 wheels;Shower seat      Prior Function Level of Independence: Independent with assistive device(s)         Comments: Son lives with her, but leave pt home alone at times. Pt has supervisionA for bathing and dressing;pt can walk with SPC and has RW, but does not usually use it. Pt performing light meal making at home and family is available 24/7 between 2 sons and a PCA that they plan to hire for bathing/dressing.     Hand Dominance   Dominant Hand: Right  Extremity/Trunk Assessment   Upper Extremity Assessment Upper Extremity Assessment: Defer to OT evaluation    Lower Extremity Assessment Lower Extremity Assessment: Generalized weakness       Communication   Communication: HOH  Cognition Arousal/Alertness:  Awake/alert Behavior During Therapy: WFL for tasks assessed/performed Overall Cognitive Status: History of cognitive impairments - at baseline                                 General Comments: Son reports cognition back to baseline.      General Comments General comments (skin integrity, edema, etc.): Son present, very supportive. Reports baseline.    Exercises     Assessment/Plan    PT Assessment Patent does not need any further PT services  PT Problem List Decreased strength;Decreased balance;Decreased mobility       PT Treatment Interventions      PT Goals (Current goals can be found in the Care Plan section)  Acute Rehab PT Goals Patient Stated Goal: to go home PT Goal Formulation: With patient/family    Frequency     Barriers to discharge        Co-evaluation               AM-PAC PT "6 Clicks" Mobility  Outcome Measure Help needed turning from your back to your side while in a flat bed without using bedrails?: None Help needed moving from lying on your back to sitting on the side of a flat bed without using bedrails?: None Help needed moving to and from a bed to a chair (including a wheelchair)?: A Little Help needed standing up from a chair using your arms (e.g., wheelchair or bedside chair)?: A Little Help needed to walk in hospital room?: A Little Help needed climbing 3-5 steps with a railing? : A Little 6 Click Score: 20    End of Session Equipment Utilized During Treatment: Gait belt Activity Tolerance: Patient tolerated treatment well Patient left: with bed alarm set;in bed   PT Visit Diagnosis: Unsteadiness on feet (R26.81)    Time: 5726-2035 PT Time Calculation (min) (ACUTE ONLY): 22 min   Charges:   PT Evaluation $PT Eval Low Complexity: 1 Low          Elayne Snare, PT, DPT  Ellouise Newer 08/01/2020, 9:59 AM

## 2020-08-01 NOTE — Progress Notes (Addendum)
STROKE TEAM PROGRESS NOTE   INTERVAL HISTORY Son presented at the bedside.  She has no complaints at this time. Son reports that she is back to baseline.    Vitals:   08/01/20 0100 08/01/20 0130 08/01/20 0200 08/01/20 0243  BP: (!) 167/65 (!) 152/72 (!) 137/109 (!) 147/80  Pulse: (!) 47 (!) 50 (!) 52 (!) 52  Resp: 14 13 18 18   Temp:    98.1 F (36.7 C)  TempSrc:      SpO2: 96% 98% 98% 93%   CBC:  Recent Labs  Lab 07/31/20 1640 07/31/20 1800  WBC 8.0 7.4  NEUTROABS  --  4.9  HGB 11.9* 12.2  HCT 34.8* 37.3  MCV 92.3 92.8  PLT 272 951   Basic Metabolic Panel:  Recent Labs  Lab 07/31/20 1422 07/31/20 1730 08/01/20 0358  NA 130* 128*  --   K 4.0 3.8  --   CL 94* 95*  --   CO2  --  25  --   GLUCOSE 103* 99  --   BUN 12 11  --   CREATININE 0.90 0.90  --   CALCIUM  --  9.1  --   MG  --   --  1.8  PHOS  --   --  3.3    Lipid Panel:  Recent Labs  Lab 08/01/20 0359  CHOL 118  TRIG 46  HDL 44  CHOLHDL 2.7  VLDL 9  LDLCALC 65    HgbA1c:  Recent Labs  Lab 08/01/20 0358  HGBA1C 6.4*   Urine Drug Screen: No results for input(s): LABOPIA, COCAINSCRNUR, LABBENZ, AMPHETMU, THCU, LABBARB in the last 168 hours.  Alcohol Level No results for input(s): ETH in the last 168 hours.  IMAGING past 24 hours MR BRAIN WO CONTRAST  Result Date: 07/31/2020 CLINICAL DATA:  Initial evaluation for acute TIA. EXAM: MRI HEAD WITHOUT CONTRAST TECHNIQUE: Multiplanar, multiecho pulse sequences of the brain and surrounding structures were obtained without intravenous contrast. COMPARISON:  Prior head CT from earlier the same day. FINDINGS: Brain: Examination technically limited as the patient was unable to tolerate the full length of the study. Additionally, images are mildly degraded by motion artifact. Generalized age-related cerebral atrophy. Confluent T2/FLAIR hyperintensity within the periventricular white matter most likely related chronic microvascular ischemic disease, mild in  nature. There is a single punctate focus of apparent diffusion abnormality involving the right frontal cortex (series 5, image 79). This is not well seen on corresponding coronal diffusion-weighted sequence. While this finding is favored to reflect edge artifact, a tiny acute ischemic cortical infarct is difficult to exclude, and could be considered in the correct clinical setting. No other evidence for acute or subacute ischemia. Gray-white matter differentiation otherwise maintained. No other areas of remote cortical infarction. 13 mm calcified meningioma along the anterior falx without associated mass effect. No other mass lesion, mass effect, or midline shift. Mild ventricular prominence related to global parenchymal volume loss of hydrocephalus. No extra-axial fluid collection. Pituitary gland suprasellar region within normal limits. Midline structures intact. Vascular: Major intracranial vascular flow voids are maintained. 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 Skull and upper cervical spine: Advanced degenerative changes about the C1-2 articulation with degenerative thickening of the tectorial membrane. Secondary mild stenosis at the craniocervical junction. Additional degenerative spondylosis noted within the upper cervical spine without high-grade stenosis. Bone marrow signal intensity within normal limits. Hyperostosis frontalis interna noted. No scalp soft tissue abnormality. Sinuses/Orbits: Patient status  post bilateral ocular lens replacement. Globes and orbital soft tissues demonstrate no acute finding. Scattered mucosal thickening noted within the ethmoidal air cells and left sphenoid sinus. Paranasal sinuses are otherwise largely clear. No significant mastoid effusion. Inner ear structures grossly normal. Other: None. IMPRESSION: 1. Technically limited exam due to the patient's inability to tolerate the full length of the study. 2. Punctate focus of  diffusion abnormality involving the right frontal cortex as above. While this finding is favored to reflect edge artifact, a tiny acute ischemic cortical infarct is difficult to exclude, and could be considered in the correct clinical setting. 3. No other acute intracranial abnormality. 4. 13 mm calcified meningioma along the anterior falx without associated mass effect. 5. Generalized age-related cerebral atrophy with mild chronic small vessel ischemic disease. Electronically Signed   By: Jeannine Boga M.D.   On: 07/31/2020 22:09   DG CHEST PORT 1 VIEW  Result Date: 07/31/2020 CLINICAL DATA:  Altered mental status.  Hypertension. EXAM: PORTABLE CHEST 1 VIEW COMPARISON:  03/27/2020, CT 10/10/2019 FINDINGS: Normal heart size. Stable mediastinal contours with aortic atherosclerosis and tortuosity. Improved bilateral pleural effusions from prior exam. No acute airspace disease. No pulmonary edema. Air-filled upper esophagus corresponds to findings on prior CT. No pneumothorax. Bones are under mineralized without acute osseous abnormality. IMPRESSION: No acute findings.  Improved pleural effusions from prior exam. Electronically Signed   By: Keith Rake M.D.   On: 07/31/2020 19:55   EEG adult  Result Date: 08/01/2020 Lora Havens, MD     08/01/2020  9:49 AM Patient Name: Brenda Lynch MRN: [id number] Epilepsy Attending: Lora Havens Referring Physician/Provider: Dr Toy Baker Date: 08/01/2020 Duration: 24.1mins Patient history: 87yo F admitted for TIA vs seizure vs acute encephalopathy Level of alertness: Awake AEDs during EEG study: None Technical aspects: This EEG study was done with scalp electrodes positioned according to the 10-20 International system of electrode placement. Electrical activity was acquired at a sampling rate of 500Hz  and reviewed with a high frequency filter of 70Hz  and a low frequency filter of 1Hz . EEG data were recorded continuously and digitally stored.  Description: The posterior dominant rhythm consists of 8 Hz activity of moderate voltage (25-35 uV) seen predominantly in posterior head regions, symmetric and reactive to eye opening and eye closing. EEG showed intermittent 2-3 Hz delta slowing in left temporal region. Hyperventilation and photic stimulation were not performed.   ABNORMALITY - Intermittent slow, left temporal region IMPRESSION: This study is suggestive of cortical dysfunction arising from left temporal region, nonspecific etiology. No seizures or epileptiform discharges were seen throughout the recording. Lora Havens   CT HEAD CODE STROKE WO CONTRAST  Result Date: 07/31/2020 CLINICAL DATA:  Code stroke. Neuro deficit, acute, stroke suspected. EXAM: CT HEAD WITHOUT CONTRAST TECHNIQUE: Contiguous axial images were obtained from the base of the skull through the vertex without intravenous contrast. COMPARISON:  Brain MRI 06/15/2020. head CT 06/15/2020. FINDINGS: Brain: The examination is significantly motion degraded, limiting evaluation. Moderate cerebral atrophy.  Comparatively mild cerebellar atrophy. Mild patchy and ill-defined hypoattenuation within the cerebral white matter is nonspecific, but compatible with chronic small vessel ischemic disease. Unchanged 13 mm calcified meningioma along the anterior falx. Within described limitations, no acute intracranial hemorrhage is identified. No acute demarcated cortical infarct is identified. No extra-axial fluid collection is identified. No midline shift. Vascular: No hyperdense vessel.  Atherosclerotic calcifications. Skull: Normal. Negative for fracture or focal lesion. Sinuses/Orbits: Visualized orbits show no acute finding. Frothy secretions and  mild to moderate mucosal thickening within the left sphenoid sinus. Trace bilateral ethmoid sinus mucosal thickening. ASPECTS [facility] Stroke Program Early CT Score) - Ganglionic level infarction (caudate, lentiform nuclei, internal capsule,  insula, M1-M3 cortex): 7 - Supraganglionic infarction (M4-M6 cortex): 3 Total score (0-10 with 10 being normal): 10 These results were communicated to Dr. Erlinda Hong At 2:43 pmon 5/14/2022by text page via the [facility] messaging system. IMPRESSION: Significantly motion degraded examination, limiting evaluation. No acute intracranial abnormality is identified. Stable atrophy and chronic small vessel ischemic disease. Unchanged 13 mm calcified meningioma along the anterior falx. Paranasal sinus disease, as described. Electronically Signed   By: Kellie Simmering DO   On: 07/31/2020 14:43    PHYSICAL EXAM: unremarkable. Exam done in bed.   Neuro - awake, alert, hard of hearing, eyes open, orientated to person, place, able to name son, as wel las his age but not oriented to time. No aphasia, paucity of speech, following most simple commands. Able to  repeat 5-word sentences.   No gaze palsy, tracking bilaterally, blinking to visual threat bilaterally, PERRL. No facial droop.  Tongue midline.  Bilateral UEs 4/5, no drift. Bilaterally LEs 4/5, no drift.  Sensation symmetrical bilaterally subjectively, b/l FTN grossly intact, gait not tested.   ASSESSMENT/PLAN Ms. DEBORAHA GOAR is a 87 y.o. female with history of CHF, CKD, CAD, DM II, HLD, dementia,and HTN who presented not able to answer questions, not follow commands, confused after a nap.  Last seen well 11 AM.  Woke up at 2 PM with symptoms. On ED arrival, patient more awake alert, much more conversant and follow simple commands.  No focal deficits found.  NIH score 2.  CT no acute abnormality.  Patient not a tPA candidate given nonfocal deficit, likely not stroke.     MRI brain limited secondary to movement, but revealed a punctate diffusion abnormality at right frontal cortex, but further review fa ors edge artifact, as well as a 42mm calcified meningioma at anterior falx and generalized age related atrophy.   Urinalysis reveals a UTI.   Encephalopathy - pucntate  DWI abnormality most likely edge artififact. Patient at baseline .  Code Stroke  CT head No acute abnormality.      MRI   07/31/20  1. Technically limited exam due to the patient's inability to  tolerate the full length of the study.  2. Punctate focus of diffusion abnormality involving the right  frontal cortex as above. While this finding is favored to reflect  edge artifact, a tiny acute ischemic cortical infarct is difficult  to exclude, and could be considered in the correct clinical setting.  3. No other acute intracranial abnormality.  4. 13 mm calcified meningioma along the anterior falx without  associated mass effect.  5. Generalized age-related cerebral atrophy with mild chronic small  vessel ischemic disease.    EEG 08/01/20  Suggestive of cortical dysfunction arising from left temporal region without seizure or epileptiform activity seen.    2D Echo 06/10/20  Left ventricular ejection fraction, by estimation, is 65   to 70%. The left ventricle has normal function. The left ventricle has no   regional wall motion abnormalities.   Left Atrium: Left atrial size was normal in size.  IAS/Shunts: No atrial level shunt detected by color flow Doppler.       LDL 65  Diabetes Mellitus, Type II  HgbA1c 6.4. no home meds    VTE prophylaxis -      Diet   Diet  Heart Room service appropriate? Yes; Fluid consistency: Thin     aspirin 325 mg daily and clopidogrel 75 mg daily prior to admission, now on aspirin 325 mg daily and clopidogrel 75 mg daily.    Therapy recommendations:  No further visits  Disposition:  home  Hypertension  Home meds:       . Permissive hypertension (OK if < 220/120) but gradually normalize in 5-7 days . Long-term BP goal normotensive   LDL 65, goal < 70     HgbA1c 6.4, goal < 7.0  CBGs Recent Labs    07/31/20 1943 08/01/20 0749  GLUCAP 112* 157*      SSI  Other Stroke Risk Factors    Advanced Age >/= 89    Coronary  artery disease  Other Active Problems  UTI : on rocephin 2gm daily IV.   Hospital day # 0   ATTENDING NOTE: I reviewed above note and agree with the assessment and plan. Pt was seen and examined.   87 y.o. female with PMH of CHF, CKD, CAD, DM II, HLD, dementia,and HTN admitted for not able to answer questions, not follow commands, confused after a nap. In ED, patient more awake alert, much more conversant and follow simple commands. CT no acute abnormality.  MRI showed one punctate focus of DWI signal at right frontal cortex, but most likely artifact on my review.  Not able to tolerating MRA head at this time.  Carotid Doppler negative, EF 65 to 70% in 05/2020.  A1c 6.4, LDL 65.  Patient currently back to her baseline, neurologically still has baseline hard of hearing, dysarthria due to poor denture, no focal deficit.  Etiology for patient symptoms likely due to encephalopathy, after a nap or associate with current UTI on Rocephin.  No further imaging such as MRA head needed as no change of management.  She is on aspirin 325 and Plavix 75 PTA, will continue DAPT on discharge.  Continue home Lipitor 20, LDL at goal.  PT no recommendations.  Neurology will sign off. Please call with questions.  No neuro follow-up needed at this time.  Thanks for the consult.   Rosalin Hawking, MD PhD Stroke Neurology 08/01/2020 5:09 PM        To contact Stroke Continuity provider, please refer to [email or web address] After hours, contact General Neurology

## 2020-08-01 NOTE — Plan of Care (Signed)
  Problem: Education: Goal: Knowledge of disease or condition will improve Outcome: Progressing Goal: Knowledge of secondary prevention will improve Outcome: Progressing Goal: Knowledge of patient specific risk factors addressed and post discharge goals established will improve Outcome: Progressing Goal: Individualized Educational Video(s) Outcome: Progressing   Problem: Coping: Goal: Will verbalize positive feelings about self Outcome: Progressing Goal: Will identify appropriate support needs Outcome: Progressing   Problem: Health Behavior/Discharge Planning: Goal: Ability to manage health-related needs will improve Outcome: Progressing   Problem: Self-Care: Goal: Ability to participate in self-care as condition permits will improve Outcome: Progressing Goal: Verbalization of feelings and concerns over difficulty with self-care will improve Outcome: Progressing Goal: Ability to communicate needs accurately will improve Outcome: Progressing   Problem: Nutrition: Goal: Risk of aspiration will decrease Outcome: Progressing Goal: Dietary intake will improve Outcome: Progressing   Problem: Intracerebral Hemorrhage Tissue Perfusion: Goal: Complications of Intracerebral Hemorrhage will be minimized Outcome: Progressing   Problem: Ischemic Stroke/TIA Tissue Perfusion: Goal: Complications of ischemic stroke/TIA will be minimized Outcome: Progressing   Problem: Spontaneous Subarachnoid Hemorrhage Tissue Perfusion: Goal: Complications of Spontaneous Subarachnoid Hemorrhage will be minimized Outcome: Progressing   Problem: Education: Goal: Knowledge of General Education information will improve Description: Including pain rating scale, medication(s)/side effects and non-pharmacologic comfort measures Outcome: Progressing   Problem: Health Behavior/Discharge Planning: Goal: Ability to manage health-related needs will improve Outcome: Progressing   Problem: Clinical  Measurements: Goal: Ability to maintain clinical measurements within normal limits will improve Outcome: Progressing Goal: Will remain free from infection Outcome: Progressing Goal: Diagnostic test results will improve Outcome: Progressing Goal: Respiratory complications will improve Outcome: Progressing Goal: Cardiovascular complication will be avoided Outcome: Progressing   Problem: Activity: Goal: Risk for activity intolerance will decrease Outcome: Progressing   Problem: Nutrition: Goal: Adequate nutrition will be maintained Outcome: Progressing   Problem: Coping: Goal: Level of anxiety will decrease Outcome: Progressing   Problem: Elimination: Goal: Will not experience complications related to bowel motility Outcome: Progressing Goal: Will not experience complications related to urinary retention Outcome: Progressing   Problem: Pain Managment: Goal: General experience of comfort will improve Outcome: Progressing   Problem: Safety: Goal: Ability to remain free from injury will improve Outcome: Progressing   Problem: Skin Integrity: Goal: Risk for impaired skin integrity will decrease Outcome: Progressing

## 2020-08-01 NOTE — Progress Notes (Signed)
EEG complete - results pending 

## 2020-08-02 ENCOUNTER — Encounter (HOSPITAL_COMMUNITY): Payer: Self-pay | Admitting: Internal Medicine

## 2020-08-02 ENCOUNTER — Institutional Professional Consult (permissible substitution): Payer: Medicare PPO | Admitting: Neurology

## 2020-08-02 DIAGNOSIS — E871 Hypo-osmolality and hyponatremia: Secondary | ICD-10-CM | POA: Diagnosis not present

## 2020-08-02 DIAGNOSIS — E785 Hyperlipidemia, unspecified: Secondary | ICD-10-CM | POA: Diagnosis not present

## 2020-08-02 DIAGNOSIS — I5032 Chronic diastolic (congestive) heart failure: Secondary | ICD-10-CM | POA: Diagnosis not present

## 2020-08-02 DIAGNOSIS — G459 Transient cerebral ischemic attack, unspecified: Secondary | ICD-10-CM | POA: Diagnosis not present

## 2020-08-02 DIAGNOSIS — R404 Transient alteration of awareness: Secondary | ICD-10-CM | POA: Diagnosis not present

## 2020-08-02 DIAGNOSIS — I10 Essential (primary) hypertension: Secondary | ICD-10-CM | POA: Diagnosis not present

## 2020-08-02 DIAGNOSIS — E1169 Type 2 diabetes mellitus with other specified complication: Secondary | ICD-10-CM | POA: Diagnosis not present

## 2020-08-02 DIAGNOSIS — F0390 Unspecified dementia without behavioral disturbance: Secondary | ICD-10-CM | POA: Diagnosis not present

## 2020-08-02 LAB — COMPREHENSIVE METABOLIC PANEL
ALT: 11 U/L (ref 0–44)
AST: 17 U/L (ref 15–41)
Albumin: 3.2 g/dL — ABNORMAL LOW (ref 3.5–5.0)
Alkaline Phosphatase: 95 U/L (ref 38–126)
Anion gap: 8 (ref 5–15)
BUN: 10 mg/dL (ref 8–23)
CO2: 27 mmol/L (ref 22–32)
Calcium: 8.8 mg/dL — ABNORMAL LOW (ref 8.9–10.3)
Chloride: 99 mmol/L (ref 98–111)
Creatinine, Ser: 0.91 mg/dL (ref 0.44–1.00)
GFR, Estimated: 58 mL/min — ABNORMAL LOW (ref >60)
Glucose, Bld: 113 mg/dL — ABNORMAL HIGH (ref 70–99)
Potassium: 3.5 mmol/L (ref 3.5–5.1)
Sodium: 134 mmol/L — ABNORMAL LOW (ref 135–145)
Total Bilirubin: 0.6 mg/dL (ref 0.3–1.2)
Total Protein: 6.4 g/dL — ABNORMAL LOW (ref 6.5–8.1)

## 2020-08-02 LAB — GLUCOSE, CAPILLARY
Glucose-Capillary: 99 mg/dL (ref 70–99)
Glucose-Capillary: 97 mg/dL (ref 70–99)
Glucose-Capillary: 180 mg/dL — ABNORMAL HIGH (ref 70–99)
Glucose-Capillary: 70 mg/dL (ref 70–99)

## 2020-08-02 LAB — CBC
HCT: 33.6 % — ABNORMAL LOW (ref 36.0–46.0)
Hemoglobin: 11.7 g/dL — ABNORMAL LOW (ref 12.0–15.0)
MCH: 31.4 pg (ref 26.0–34.0)
MCHC: 34.8 g/dL (ref 30.0–36.0)
MCV: 90.1 fL (ref 80.0–100.0)
Platelets: 298 10*3/uL (ref 150–400)
RBC: 3.73 MIL/uL — ABNORMAL LOW (ref 3.87–5.11)
RDW: 12.9 % (ref 11.5–15.5)
WBC: 6.8 10*3/uL (ref 4.0–10.5)
nRBC: 0 % (ref 0.0–0.2)

## 2020-08-02 LAB — URINE CULTURE

## 2020-08-02 NOTE — Telephone Encounter (Signed)
Looks like he brought her in to the hospital 5/14 currently still admitted. Was seen by Dr. Erlinda Hong -episode of encephalopathy possibly related to UTI. This can be further discussed prior to d/c with son in regards to when to seek emergent treatment vs monitoring.  Thank you.

## 2020-08-02 NOTE — Plan of Care (Signed)
  Problem: Coping: Goal: Will verbalize positive feelings about self Outcome: Progressing Goal: Will identify appropriate support needs Outcome: Progressing   Problem: Health Behavior/Discharge Planning: Goal: Ability to manage health-related needs will improve Outcome: Progressing   Problem: Self-Care: Goal: Ability to participate in self-care as condition permits will improve Outcome: Progressing Goal: Verbalization of feelings and concerns over difficulty with self-care will improve Outcome: Progressing Goal: Ability to communicate needs accurately will improve Outcome: Progressing   Problem: Nutrition: Goal: Risk of aspiration will decrease Outcome: Progressing Goal: Dietary intake will improve Outcome: Progressing   Problem: Intracerebral Hemorrhage Tissue Perfusion: Goal: Complications of Intracerebral Hemorrhage will be minimized Outcome: Progressing   Problem: Ischemic Stroke/TIA Tissue Perfusion: Goal: Complications of ischemic stroke/TIA will be minimized Outcome: Progressing   Problem: Spontaneous Subarachnoid Hemorrhage Tissue Perfusion: Goal: Complications of Spontaneous Subarachnoid Hemorrhage will be minimized Outcome: Progressing   Problem: Health Behavior/Discharge Planning: Goal: Ability to manage health-related needs will improve Outcome: Progressing   Problem: Clinical Measurements: Goal: Ability to maintain clinical measurements within normal limits will improve Outcome: Progressing Goal: Will remain free from infection Outcome: Progressing Goal: Diagnostic test results will improve Outcome: Progressing Goal: Respiratory complications will improve Outcome: Progressing Goal: Cardiovascular complication will be avoided Outcome: Progressing   Problem: Activity: Goal: Risk for activity intolerance will decrease Outcome: Progressing   Problem: Nutrition: Goal: Adequate nutrition will be maintained Outcome: Progressing   Problem:  Coping: Goal: Level of anxiety will decrease Outcome: Progressing   Problem: Elimination: Goal: Will not experience complications related to bowel motility Outcome: Progressing Goal: Will not experience complications related to urinary retention Outcome: Progressing   Problem: Pain Managment: Goal: General experience of comfort will improve Outcome: Progressing   Problem: Safety: Goal: Ability to remain free from injury will improve Outcome: Progressing   Problem: Skin Integrity: Goal: Risk for impaired skin integrity will decrease Outcome: Progressing   Problem: Education: Goal: Knowledge of disease or condition will improve Outcome: Not Progressing Goal: Knowledge of secondary prevention will improve Outcome: Not Progressing Goal: Knowledge of patient specific risk factors addressed and post discharge goals established will improve Outcome: Not Progressing   Problem: Education: Goal: Knowledge of General Education information will improve Description: Including pain rating scale, medication(s)/side effects and non-pharmacologic comfort measures Outcome: Not Progressing

## 2020-08-02 NOTE — Telephone Encounter (Signed)
Pt saw Janett Billow 06/22/20. I will send message to her to review.

## 2020-08-02 NOTE — Progress Notes (Signed)
Pt unwilling to keep tele leads on. Per Mansy, J. it's ok to keep pt off tele monitoring and to d/c continuous fluids.

## 2020-08-02 NOTE — Progress Notes (Signed)
Pt has been refusing for vital signs to be taken for the past 2 hours. Pt believes she is in her house and doesn't want anyone to bother her. Will attempt again at a later time.

## 2020-08-02 NOTE — Discharge Summary (Addendum)
Physician Discharge Summary  Brenda Lynch ZOX:[id number] DOB: 04-27-1923 DOA: 07/31/2020  PCP: [name], MD  Admit date: 07/31/2020 Discharge date: 08/02/2020  Time spent: 50* minutes  Recommendations for Outpatient Follow-up:  1. Follow-up PCP in 2 weeks   Discharge Diagnoses:   Principal problem Staring spells/encephalopathy  Active Problems:   Hypertension   Hypercholesterolemia   Chronic diastolic CHF (congestive heart failure) (HCC)   TIA (transient ischemic attack)   Type 2 diabetes mellitus with hyperlipidemia ([place])   Dementia without behavioral disturbance ([place])   Hyponatremia   Discharge Condition: Stable  Diet recommendation: Heart healthy diet  There were no vitals filed for this visit.  History of present illness:  87 year old female with medical history of CHF, CKD, CAD, diabetes mellitus type 2, hyperlipidemia, dementia, hypertension presented to ED with complaints of confusion after she woke up from having a nap at home.  As per son she has been having the symptoms intermittently for past few months.  Patient would just have staring spells, still nonverbal which lasts usually for 1 hour. Neurology was consulted,   Hospital Course:  1. *TIA versus seizure-neurology was consulted, work-up underway.  MRI brain is negative for stroke.  Carotid duplex showed no significant stenosis in both carotid arteries.  Antegrade flow in the vertebral arteries.  Echocardiogram from March 2022 showed EF 65 to 70%.  Also showed grade 1 diastolic dysfunction.  EEG showed findings suggestive of cortical dysfunction present in the left temporal region.    Continue aspirin, atorvastatin, Plavix.  Neurology was consulted, no further recommendations.  Continue DAPT.  2. Staring spell/encephalopathy-patient is having episodes of staring spells usually after taking a nap intermittently for past few months.  She is on multiple sedating medications including Benadryl, hydroxyzine,  trazodone, chlorphentermine.  This is likely polypharmacy contributing to patient's encephalopathy.  I discussed with patient's son and recommended to stop Benadryl, chlorphentermine, hydroxyzine.  EEG shows findings suggestive of cortical dysfunction from the left temporal region.  No seizures or epileptiform discharges seen throughout recording.  Patient is mentally back to baseline  3. Abnormal UA -patient had abnormal UA, urine culture was obtained which showed multiple species.  Will discontinue antibiotics.  Patient is afebrile, asymptomatic.    4. Dementia-without behavioral disturbance.  Stable  5. Chronic diastolic CHF-euvolemic  6. Hyponatremia-sodium was 128, started on gentle IV hydration with normal saline.  Likely from HCTZ.   This morning sodium is 134.  7. Hypertension-blood pressure is mild elevated, continue home regimen HCTZ, lisinopril.   Procedures:    Consultations:  Neurology  Discharge Exam: Vitals:   08/01/20 1405 08/01/20 2045  BP: (!) 158/62 (!) 161/69  Pulse: 60 66  Resp: 17 16  Temp: 98.2 F (36.8 C) 98.1 F (36.7 C)  SpO2: 98% 99%    General: Appears in no acute distress Cardiovascular: S1-S2, regular Respiratory: Clear to auscultation bilaterally  Discharge Instructions   Discharge Instructions    Diet - low sodium heart healthy   Complete by: As directed    Increase activity slowly   Complete by: As directed      Allergies as of 08/02/2020      Reactions   Ciprofloxacin Nausea Only, Other (See Comments)   GI intolerance   Doxycycline Hyclate Other (See Comments)   BS elev, Head pressure   Penicillins Hives, Other (See Comments)   Welts, also Has patient had a PCN reaction causing immediate rash, facial/tongue/throat swelling, SOB or lightheadedness with hypotension: Yes Has patient had  a PCN reaction causing severe rash involving mucus membranes or skin necrosis: No Has patient had a PCN reaction that required hospitalization  No Has patient had a PCN reaction occurring within the last 10 years: No If all of the above answers are "NO", then may proceed with Cephalosporin use.   Penicillin G Hives, Other (See Comments)   Welts on the body, also      Medication List    STOP taking these medications   Align 4 MG Caps   calcium citrate-vitamin D 315-200 MG-UNIT tablet Commonly known as: CITRACAL+D   clotrimazole 1 % cream Commonly known as: LOTRIMIN   Coricidin D Cold/Flu/Sinus 2-5-325 MG Tabs Generic drug: Chlorphen-Phenyleph-APAP   diphenhydrAMINE 25 MG tablet Commonly known as: BENADRYL   hydrocortisone 2.5 % rectal cream Commonly known as: ANUSOL-HC   hydrOXYzine 25 MG tablet Commonly known as: ATARAX/VISTARIL   senna 8.6 MG Tabs tablet Commonly known as: SENOKOT   traZODone 50 MG tablet Commonly known as: DESYREL     TAKE these medications   acetaminophen 500 MG tablet Commonly known as: TYLENOL Take 500 mg by mouth every 6 (six) hours as needed (for headaches or mild pain).   aspirin 325 MG tablet Take 1 tablet (325 mg total) by mouth daily.   atorvastatin 20 MG tablet Commonly known as: LIPITOR Take 1 tablet (20 mg total) by mouth daily.   calcium carbonate 500 MG chewable tablet Commonly known as: TUMS - dosed in mg elemental calcium Chew 1 tablet by mouth daily as needed for indigestion or heartburn.   cetirizine 10 MG tablet Commonly known as: ZYRTEC Take 10 mg by mouth at bedtime.   clopidogrel 75 MG tablet Commonly known as: PLAVIX Take 1 tablet (75 mg total) by mouth daily.   estradiol 0.1 MG/GM vaginal cream Commonly known as: ESTRACE Place 1 Applicatorful vaginally See admin instructions. Place 1 applicatorful vaginally 2 times a week as needed for dryness   fluticasone 50 MCG/ACT nasal spray Commonly known as: FLONASE Place 1-2 sprays into both nostrils daily as needed for allergies or rhinitis.   hydrochlorothiazide 12.5 MG capsule Commonly known as:  MICROZIDE TAKE (1) CAPSULE DAILY. What changed: See the new instructions.   hydrocortisone 25 MG suppository Commonly known as: ANUSOL-HC Place 25 mg rectally 2 (two) times daily as needed for hemorrhoids or itching.   loperamide 2 MG tablet Commonly known as: IMODIUM A-D Take 2 mg by mouth daily as needed for diarrhea or loose stools.   MELATONIN PO Take 1 tablet by mouth at bedtime.   montelukast 10 MG tablet Commonly known as: SINGULAIR Take 10 mg by mouth at bedtime.   NONFORMULARY OR COMPOUNDED ITEM Place 1 application rectally See admin instructions. Diltiazem 2% ointment, compounded- APPLY TOPICALLY TO ANUS FOUR TIMES DAILY   ONE TOUCH ULTRA TEST test strip Generic drug: glucose blood 1 each by Other route as directed.   onetouch ultrasoft lancets 1 each by Other route as directed.   OSTEO BI-FLEX ADV DOUBLE ST PO Take 1 tablet by mouth daily.   polyethylene glycol powder 17 GM/SCOOP powder Commonly known as: GLYCOLAX/MIRALAX Take 8.5-17 g by mouth daily as needed for mild constipation (MIX AND DRINK).   potassium chloride 10 MEQ CR capsule Commonly known as: MICRO-K Take 10 mEq by mouth in the morning.   PriLOSEC OTC 20 MG tablet Generic drug: omeprazole Take 20 mg by mouth daily before breakfast.   ramipril 10 MG capsule Commonly known as: ALTACE Take 10  mg by mouth in the morning.   Simply Saline 0.9 % Aers Generic drug: Saline Place 1 spray into both nostrils as needed (for congestion).   Systane Hydration PF 0.4-0.3 % Soln Generic drug: Polyethyl Glyc-Propyl Glyc PF Place 1 drop into both eyes 3 (three) times daily as needed (for irritation).   Vitamin D3 25 MCG (1000 UT) Caps Take 1,000 Units by mouth in the morning.      Allergies  Allergen Reactions  . Ciprofloxacin Nausea Only and Other (See Comments)    GI intolerance   . Doxycycline Hyclate Other (See Comments)    BS elev, Head pressure   . Penicillins Hives and Other (See  Comments)    Welts, also Has patient had a PCN reaction causing immediate rash, facial/tongue/throat swelling, SOB or lightheadedness with hypotension: Yes Has patient had a PCN reaction causing severe rash involving mucus membranes or skin necrosis: No Has patient had a PCN reaction that required hospitalization No Has patient had a PCN reaction occurring within the last 10 years: No If all of the above answers are "NO", then may proceed with Cephalosporin use.  Marland Kitchen Penicillin G Hives and Other (See Comments)    Welts on the body, also     Follow-up Information    [name], MD Follow up in 2 week(s).   Specialty: Internal Medicine Contact information: [address] [place] [address] [phone]        Sueanne Margarita, MD .   Specialty: Cardiology Contact information: [phone] [address] [place] [address] [phone]                The results of significant diagnostics from this hospitalization (including imaging, microbiology, ancillary and laboratory) are listed below for reference.    Significant Diagnostic Studies: MR BRAIN WO CONTRAST  Result Date: 07/31/2020 CLINICAL DATA:  Initial evaluation for acute TIA. EXAM: MRI HEAD WITHOUT CONTRAST TECHNIQUE: Multiplanar, multiecho pulse sequences of the brain and surrounding structures were obtained without intravenous contrast. COMPARISON:  Prior head CT from earlier the same day. FINDINGS: Brain: Examination technically limited as the patient was unable to tolerate the full length of the study. Additionally, images are mildly degraded by motion artifact. Generalized age-related cerebral atrophy. Confluent T2/FLAIR hyperintensity within the periventricular white matter most likely related chronic microvascular ischemic disease, mild in nature. There is a single punctate focus of apparent diffusion abnormality involving the right frontal cortex (series 5, image 79). This is not well seen on  corresponding coronal diffusion-weighted sequence. While this finding is favored to reflect edge artifact, a tiny acute ischemic cortical infarct is difficult to exclude, and could be considered in the correct clinical setting. No other evidence for acute or subacute ischemia. Gray-white matter differentiation otherwise maintained. No other areas of remote cortical infarction. 13 mm calcified meningioma along the anterior falx without associated mass effect. No other mass lesion, mass effect, or midline shift. Mild ventricular prominence related to global parenchymal volume loss of hydrocephalus. No extra-axial fluid collection. Pituitary gland suprasellar region within normal limits. Midline structures intact. Vascular: Major intracranial vascular flow voids are maintained. 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 Skull and upper cervical spine: Advanced degenerative changes about the C1-2 articulation with degenerative thickening of the tectorial membrane. Secondary mild stenosis at the craniocervical junction. Additional degenerative spondylosis noted within the upper cervical spine without high-grade stenosis. Bone marrow signal intensity within normal limits. Hyperostosis frontalis interna  noted. No scalp soft tissue abnormality. Sinuses/Orbits: Patient status post bilateral ocular lens replacement. Globes and orbital soft tissues demonstrate no acute finding. Scattered mucosal thickening noted within the ethmoidal air cells and left sphenoid sinus. Paranasal sinuses are otherwise largely clear. No significant mastoid effusion. Inner ear structures grossly normal. Other: None. IMPRESSION: 1. Technically limited exam due to the patient's inability to tolerate the full length of the study. 2. Punctate focus of diffusion abnormality involving the right frontal cortex as above. While this finding is favored to reflect edge artifact, a tiny acute ischemic cortical infarct  is difficult to exclude, and could be considered in the correct clinical setting. 3. No other acute intracranial abnormality. 4. 13 mm calcified meningioma along the anterior falx without associated mass effect. 5. Generalized age-related cerebral atrophy with mild chronic small vessel ischemic disease. Electronically Signed   By: Jeannine Boga M.D.   On: 07/31/2020 22:09   DG CHEST PORT 1 VIEW  Result Date: 07/31/2020 CLINICAL DATA:  Altered mental status.  Hypertension. EXAM: PORTABLE CHEST 1 VIEW COMPARISON:  03/27/2020, CT 10/10/2019 FINDINGS: Normal heart size. Stable mediastinal contours with aortic atherosclerosis and tortuosity. Improved bilateral pleural effusions from prior exam. No acute airspace disease. No pulmonary edema. Air-filled upper esophagus corresponds to findings on prior CT. No pneumothorax. Bones are under mineralized without acute osseous abnormality. IMPRESSION: No acute findings.  Improved pleural effusions from prior exam. Electronically Signed   By: Keith Rake M.D.   On: 07/31/2020 19:55   EEG adult  Result Date: 08/01/2020 Lora Havens, MD     08/01/2020  9:49 AM Patient Name: Brenda Lynch MRN: [id number] Epilepsy Attending: Lora Havens Referring Physician/Provider: Dr Toy Baker Date: 08/01/2020 Duration: 24.16mins Patient history: 87yo F admitted for TIA vs seizure vs acute encephalopathy Level of alertness: Awake AEDs during EEG study: None Technical aspects: This EEG study was done with scalp electrodes positioned according to the 10-20 International system of electrode placement. Electrical activity was acquired at a sampling rate of 500Hz  and reviewed with a high frequency filter of 70Hz  and a low frequency filter of 1Hz . EEG data were recorded continuously and digitally stored. Description: The posterior dominant rhythm consists of 8 Hz activity of moderate voltage (25-35 uV) seen predominantly in posterior head regions, symmetric and  reactive to eye opening and eye closing. EEG showed intermittent 2-3 Hz delta slowing in left temporal region. Hyperventilation and photic stimulation were not performed.   ABNORMALITY - Intermittent slow, left temporal region IMPRESSION: This study is suggestive of cortical dysfunction arising from left temporal region, nonspecific etiology. No seizures or epileptiform discharges were seen throughout the recording. Lora Havens   CT HEAD CODE STROKE WO CONTRAST  Result Date: 07/31/2020 CLINICAL DATA:  Code stroke. Neuro deficit, acute, stroke suspected. EXAM: CT HEAD WITHOUT CONTRAST TECHNIQUE: Contiguous axial images were obtained from the base of the skull through the vertex without intravenous contrast. COMPARISON:  Brain MRI 06/15/2020. head CT 06/15/2020. FINDINGS: Brain: The examination is significantly motion degraded, limiting evaluation. Moderate cerebral atrophy.  Comparatively mild cerebellar atrophy. Mild patchy and ill-defined hypoattenuation within the cerebral white matter is nonspecific, but compatible with chronic small vessel ischemic disease. Unchanged 13 mm calcified meningioma along the anterior falx. Within described limitations, no acute intracranial hemorrhage is identified. No acute demarcated cortical infarct is identified. No extra-axial fluid collection is identified. No midline shift. Vascular: No hyperdense vessel.  Atherosclerotic calcifications. Skull: Normal. Negative for fracture or focal lesion. Sinuses/Orbits:  Visualized orbits show no acute finding. Frothy secretions and mild to moderate mucosal thickening within the left sphenoid sinus. Trace bilateral ethmoid sinus mucosal thickening. ASPECTS [facility] Stroke Program Early CT Score) - Ganglionic level infarction (caudate, lentiform nuclei, internal capsule, insula, M1-M3 cortex): 7 - Supraganglionic infarction (M4-M6 cortex): 3 Total score (0-10 with 10 being normal): 10 These results were communicated to Dr. Erlinda Hong At 2:43  pmon 5/14/2022by text page via the [facility] messaging system. IMPRESSION: Significantly motion degraded examination, limiting evaluation. No acute intracranial abnormality is identified. Stable atrophy and chronic small vessel ischemic disease. Unchanged 13 mm calcified meningioma along the anterior falx. Paranasal sinus disease, as described. Electronically Signed   By: Kellie Simmering DO   On: 07/31/2020 14:43   VAS US CAROTID  Result Date: 08/01/2020 Carotid Arterial Duplex Study Patient Name:  Brenda Lynch  Date of Exam:   08/01/2020 Medical Rec #: [id number]       Accession #:    [id number] Date of Birth: 04-09-1923       Patient Gender: F Patient Age:   3Y Exam Location:  [facility] Procedure:      VAS US CAROTID Referring Phys: [id number] ANASTASSIA DOUTOVA --------------------------------------------------------------------------------  Indications:       TIA. Risk Factors:      Hypertension, hyperlipidemia, Diabetes, past history of                    smoking, coronary artery disease. Other Factors:     CHF, CKD. Comparison Study:  No previous exams Performing Technologist: Rogelia Rohrer  Examination Guidelines: A complete evaluation includes B-mode imaging, spectral Doppler, color Doppler, and power Doppler as needed of all accessible portions of each vessel. Bilateral testing is considered an integral part of a complete examination. Limited examinations for reoccurring indications may be performed as noted.  Right Carotid Findings: +----------+--------+--------+--------+------------------+------------------+           PSV cm/sEDV cm/sStenosisPlaque DescriptionComments           +----------+--------+--------+--------+------------------+------------------+ CCA Prox  52      10                                intimal thickening +----------+--------+--------+--------+------------------+------------------+ CCA Distal38      8                                 intimal thickening  +----------+--------+--------+--------+------------------+------------------+ ICA Prox  40      12                                                   +----------+--------+--------+--------+------------------+------------------+ ICA Mid                                             intimal thickening +----------+--------+--------+--------+------------------+------------------+ ICA Distal42      12                                                   +----------+--------+--------+--------+------------------+------------------+  ECA       56      5                                                    +----------+--------+--------+--------+------------------+------------------+ +----------+--------+-------+----------------+-------------------+           PSV cm/sEDV cmsDescribe        Arm Pressure (mmHG) +----------+--------+-------+----------------+-------------------+ [id number]      0      Multiphasic, WNL                    +----------+--------+-------+----------------+-------------------+ +---------+--------+--+--------+-+---------+ VertebralPSV cm/s21EDV cm/s5Antegrade +---------+--------+--+--------+-+---------+  Left Carotid Findings: +----------+--------+--------+--------+------------------+------------------+           PSV cm/sEDV cm/sStenosisPlaque DescriptionComments           +----------+--------+--------+--------+------------------+------------------+ CCA Prox  83      13                                intimal thickening +----------+--------+--------+--------+------------------+------------------+ CCA Distal67      16              hypoechoic        intimal thickening +----------+--------+--------+--------+------------------+------------------+ ICA Prox  56      13                                tortuous           +----------+--------+--------+--------+------------------+------------------+ ICA Distal46      12                                                    +----------+--------+--------+--------+------------------+------------------+ ECA       51      8                                                    +----------+--------+--------+--------+------------------+------------------+ +----------+--------+--------+----------------+-------------------+           PSV cm/sEDV cm/sDescribe        Arm Pressure (mmHG) +----------+--------+--------+----------------+-------------------+ [id number]      0       Multiphasic, WNL                    +----------+--------+--------+----------------+-------------------+ +---------+--------+--+--------+--+---------+ VertebralPSV cm/s37EDV cm/s10Antegrade +---------+--------+--+--------+--+---------+   Summary: Right Carotid: The extracranial vessels were near-normal with only minimal wall                thickening or plaque. Left Carotid: The extracranial vessels were near-normal with only minimal wall               thickening or plaque. Vertebrals:  Bilateral vertebral arteries demonstrate antegrade flow. Subclavians: Normal flow hemodynamics were seen in bilateral subclavian              arteries. *See table(s) above for measurements and observations.     Preliminary     Microbiology: Recent Results (from the past 240 hour(s))  Resp Panel by RT-PCR (  Flu A&B, Covid) Nasopharyngeal Swab     Status: None   Collection Time: 07/31/20  5:34 PM   Specimen: Nasopharyngeal Swab; Nasopharyngeal(NP) swabs in vial transport medium  Result Value Ref Range Status   SARS Coronavirus 2 by RT PCR NEGATIVE NEGATIVE Final    Comment: (NOTE) SARS-CoV-2 target nucleic acids are NOT DETECTED.  The SARS-CoV-2 RNA is generally detectable in upper respiratory specimens during the acute phase of infection. The lowest concentration of SARS-CoV-2 viral copies this assay can detect is 138 copies/mL. A negative result does not preclude SARS-Cov-2 infection and should not be used as the sole basis  for treatment or other patient management decisions. A negative result may occur with  improper specimen collection/handling, submission of specimen other than nasopharyngeal swab, presence of viral mutation(s) within the areas targeted by this assay, and inadequate number of viral copies(<138 copies/mL). A negative result must be combined with clinical observations, patient history, and epidemiological information. The expected result is Negative.  Fact Sheet for Patients:  EntrepreneurPulse.com.au  Fact Sheet for Healthcare Providers:  IncredibleEmployment.be  This test is no t yet approved or cleared by the [place] FDA and  has been authorized for detection and/or diagnosis of SARS-CoV-2 by FDA under an Emergency Use Authorization (EUA). This EUA will remain  in effect (meaning this test can be used) for the duration of the COVID-19 declaration under Section 564(b)(1) of the Act, 21 U.S.C.section 360bbb-3(b)(1), unless the authorization is terminated  or revoked sooner.       Influenza A by PCR NEGATIVE NEGATIVE Final   Influenza B by PCR NEGATIVE NEGATIVE Final    Comment: (NOTE) The Xpert Xpress SARS-CoV-2/FLU/RSV plus assay is intended as an aid in the diagnosis of influenza from Nasopharyngeal swab specimens and should not be used as a sole basis for treatment. Nasal washings and aspirates are unacceptable for Xpert Xpress SARS-CoV-2/FLU/RSV testing.  Fact Sheet for Patients: EntrepreneurPulse.com.au  Fact Sheet for Healthcare Providers: IncredibleEmployment.be  This test is not yet approved or cleared by the [place] FDA and has been authorized for detection and/or diagnosis of SARS-CoV-2 by FDA under an Emergency Use Authorization (EUA). This EUA will remain in effect (meaning this test can be used) for the duration of the COVID-19 declaration under Section 564(b)(1) of the Act, 21  U.S.C. section 360bbb-3(b)(1), unless the authorization is terminated or revoked.  Performed at [facility], [place] [address], [place], [place] [address]   Urine culture     Status: Abnormal   Collection Time: 07/31/20 11:24 PM   Specimen: Urine, Random  Result Value Ref Range Status   Specimen Description URINE, RANDOM  Final   Special Requests   Final    NONE Performed at [facility], [place] [address], [place], [place] [address]    Culture MULTIPLE SPECIES PRESENT, SUGGEST RECOLLECTION (A)  Final   Report Status 08/02/2020 FINAL  Final     Labs: Basic Metabolic Panel: Recent Labs  Lab 07/31/20 1422 07/31/20 1730 08/01/20 0358 08/02/20 0355  NA 130* 128*  --  134*  K 4.0 3.8  --  3.5  CL 94* 95*  --  99  CO2  --  25  --  27  GLUCOSE 103* 99  --  113*  BUN 12 11  --  10  CREATININE 0.90 0.90  --  0.91  CALCIUM  --  9.1  --  8.8*  MG  --   --  1.8  --  PHOS  --   --  3.3  --    Liver Function Tests: Recent Labs  Lab 07/31/20 1730 08/02/20 0355  AST 18 17  ALT 11 11  ALKPHOS 85 95  BILITOT 0.7 0.6  PROT 6.8 6.4*  ALBUMIN 3.5 3.2*   No results for input(s): LIPASE, AMYLASE in the last 168 hours. Recent Labs  Lab 07/31/20 1432  AMMONIA 17   CBC: Recent Labs  Lab 07/31/20 1422 07/31/20 1640 07/31/20 1800 08/02/20 0355  WBC  --  8.0 7.4 6.8  NEUTROABS  --   --  4.9  --   HGB 12.6 11.9* 12.2 11.7*  HCT 37.0 34.8* 37.3 33.6*  MCV  --  92.3 92.8 90.1  PLT  --  272 320 298   Cardiac Enzymes: Recent Labs  Lab 08/01/20 0358  CKTOTAL 52   BNP: BNP (last 3 results) Recent Labs    07/31/20 1433  BNP 86.0    ProBNP (last 3 results) No results for input(s): PROBNP in the last 8760 hours.  CBG: Recent Labs  Lab 08/01/20 0749 08/01/20 1140 08/01/20 1555 08/02/20 0236 08/02/20 0800  GLUCAP 157* 107* 137* 97 180*       Signed:  Oswald Hillock MD.  [facility] 08/02/2020, 9:51 AM

## 2020-08-12 ENCOUNTER — Other Ambulatory Visit: Payer: Self-pay | Admitting: *Deleted

## 2020-08-12 NOTE — Telephone Encounter (Signed)
We received a refill request for Atorvastatin however our office has not prescribed in past. Spoke with Janett Billow NP. PCP should prescribe this. Faxed message back to [facility] advising they contact Dr [name] whom we have on file as pt's PCP. Received a receipt of confirmation.

## 2020-09-16 DIAGNOSIS — I5030 Unspecified diastolic (congestive) heart failure: Secondary | ICD-10-CM | POA: Diagnosis not present

## 2020-09-16 DIAGNOSIS — D509 Iron deficiency anemia, unspecified: Secondary | ICD-10-CM | POA: Diagnosis not present

## 2020-09-16 DIAGNOSIS — E7800 Pure hypercholesterolemia, unspecified: Secondary | ICD-10-CM | POA: Diagnosis not present

## 2020-09-16 DIAGNOSIS — E1122 Type 2 diabetes mellitus with diabetic chronic kidney disease: Secondary | ICD-10-CM | POA: Diagnosis not present

## 2020-09-16 DIAGNOSIS — I10 Essential (primary) hypertension: Secondary | ICD-10-CM | POA: Diagnosis not present

## 2020-09-16 DIAGNOSIS — M8580 Other specified disorders of bone density and structure, unspecified site: Secondary | ICD-10-CM | POA: Diagnosis not present

## 2020-09-16 DIAGNOSIS — K219 Gastro-esophageal reflux disease without esophagitis: Secondary | ICD-10-CM | POA: Diagnosis not present

## 2020-09-16 DIAGNOSIS — M1990 Unspecified osteoarthritis, unspecified site: Secondary | ICD-10-CM | POA: Diagnosis not present

## 2020-09-16 DIAGNOSIS — E1142 Type 2 diabetes mellitus with diabetic polyneuropathy: Secondary | ICD-10-CM | POA: Diagnosis not present

## 2020-09-27 DIAGNOSIS — R110 Nausea: Secondary | ICD-10-CM | POA: Diagnosis not present

## 2020-09-27 DIAGNOSIS — E46 Unspecified protein-calorie malnutrition: Secondary | ICD-10-CM | POA: Diagnosis not present

## 2020-09-27 DIAGNOSIS — F0390 Unspecified dementia without behavioral disturbance: Secondary | ICD-10-CM | POA: Diagnosis not present

## 2020-10-01 ENCOUNTER — Ambulatory Visit: Payer: Medicare PPO | Admitting: Podiatry

## 2020-10-01 ENCOUNTER — Telehealth: Payer: Self-pay | Admitting: *Deleted

## 2020-10-01 NOTE — Telephone Encounter (Signed)
Patient is calling to cancel her appointment for today, does not need a f/u.

## 2020-10-09 ENCOUNTER — Emergency Department (HOSPITAL_COMMUNITY)
Admission: EM | Admit: 2020-10-09 | Discharge: 2020-10-09 | Disposition: A | Discharge status: Home / Self Care | Payer: Medicare PPO | Attending: Emergency Medicine | Admitting: Emergency Medicine

## 2020-10-09 ENCOUNTER — Encounter (HOSPITAL_COMMUNITY): Payer: Self-pay

## 2020-10-09 ENCOUNTER — Other Ambulatory Visit: Payer: Self-pay

## 2020-10-09 DIAGNOSIS — E119 Type 2 diabetes mellitus without complications: Secondary | ICD-10-CM | POA: Diagnosis not present

## 2020-10-09 DIAGNOSIS — I130 Hypertensive heart and chronic kidney disease with heart failure and stage 1 through stage 4 chronic kidney disease, or unspecified chronic kidney disease: Secondary | ICD-10-CM | POA: Insufficient documentation

## 2020-10-09 DIAGNOSIS — N182 Chronic kidney disease, stage 2 (mild): Secondary | ICD-10-CM | POA: Diagnosis not present

## 2020-10-09 DIAGNOSIS — R4689 Other symptoms and signs involving appearance and behavior: Secondary | ICD-10-CM

## 2020-10-09 DIAGNOSIS — I2510 Atherosclerotic heart disease of native coronary artery without angina pectoris: Secondary | ICD-10-CM | POA: Insufficient documentation

## 2020-10-09 DIAGNOSIS — F0390 Unspecified dementia without behavioral disturbance: Secondary | ICD-10-CM | POA: Insufficient documentation

## 2020-10-09 DIAGNOSIS — R4189 Other symptoms and signs involving cognitive functions and awareness: Secondary | ICD-10-CM | POA: Insufficient documentation

## 2020-10-09 DIAGNOSIS — R410 Disorientation, unspecified: Secondary | ICD-10-CM | POA: Diagnosis not present

## 2020-10-09 DIAGNOSIS — Z87891 Personal history of nicotine dependence: Secondary | ICD-10-CM | POA: Diagnosis not present

## 2020-10-09 DIAGNOSIS — I5032 Chronic diastolic (congestive) heart failure: Secondary | ICD-10-CM | POA: Diagnosis not present

## 2020-10-09 LAB — BASIC METABOLIC PANEL
Anion gap: 9 (ref 5–15)
BUN: 13 mg/dL (ref 8–23)
CO2: 26 mmol/L (ref 22–32)
Calcium: 9.5 mg/dL (ref 8.9–10.3)
Chloride: 98 mmol/L (ref 98–111)
Creatinine, Ser: 0.94 mg/dL (ref 0.44–1.00)
GFR, Estimated: 56 mL/min — ABNORMAL LOW (ref >60)
Glucose, Bld: 119 mg/dL — ABNORMAL HIGH (ref 70–99)
Potassium: 3.5 mmol/L (ref 3.5–5.1)
Sodium: 133 mmol/L — ABNORMAL LOW (ref 135–145)

## 2020-10-09 LAB — URINALYSIS, ROUTINE W REFLEX MICROSCOPIC
Bilirubin Urine: NEGATIVE
Glucose, UA: NEGATIVE mg/dL
Ketones, ur: NEGATIVE mg/dL
Nitrite: NEGATIVE
Protein, ur: NEGATIVE mg/dL
Specific Gravity, Urine: 1.01 (ref 1.005–1.030)
pH: 7 (ref 5.0–8.0)

## 2020-10-09 LAB — CBC WITH DIFFERENTIAL/PLATELET
Abs Immature Granulocytes: 0.02 10*3/uL (ref 0.00–0.07)
Basophils Absolute: 0.1 10*3/uL (ref 0.0–0.1)
Basophils Relative: 1 %
Eosinophils Absolute: 0.1 10*3/uL (ref 0.0–0.5)
Eosinophils Relative: 1 %
HCT: 36.1 % (ref 36.0–46.0)
Hemoglobin: 12.2 g/dL (ref 12.0–15.0)
Immature Granulocytes: 0 %
Lymphocytes Relative: 23 %
Lymphs Abs: 1.8 10*3/uL (ref 0.7–4.0)
MCH: 30.4 pg (ref 26.0–34.0)
MCHC: 33.8 g/dL (ref 30.0–36.0)
MCV: 90 fL (ref 80.0–100.0)
Monocytes Absolute: 0.6 10*3/uL (ref 0.1–1.0)
Monocytes Relative: 7 %
Neutro Abs: 5.3 10*3/uL (ref 1.7–7.7)
Neutrophils Relative %: 68 %
Platelets: 337 10*3/uL (ref 150–400)
RBC: 4.01 MIL/uL (ref 3.87–5.11)
RDW: 14.6 % (ref 11.5–15.5)
WBC: 7.8 10*3/uL (ref 4.0–10.5)
nRBC: 0 % (ref 0.0–0.2)

## 2020-10-09 NOTE — ED Provider Notes (Signed)
Emergency Department Provider Note   I have reviewed the triage vital signs and the nursing notes.   HISTORY  Chief Complaint Dementia   HPI Brenda Lynch is a 87 y.o. female with past medical history reviewed below including dementia presents with her son with increased behavioral disturbance.  Patient's son states that patient has had dementia for a [place] time but they have been able to control behaviors at home.  He states that she has become increasingly confused and confrontational.  She is been trying to get out of the house.  They are having to be increasingly watchful with her and occasionally restrain her.  She is not had any falls or hits to the head.  No new medications.  No fevers.  He states that they have seen home health and social work in the past but her symptoms were less severe at that time and she did not require any placement.  He is concerned that with her worsening dementia she may ultimately require nursing home placement and is looking for resources.   Level 5 caveat: Dementia    Past Medical History:  Diagnosis Date   Allergic rhinitis    Chronic diastolic CHF (congestive heart failure) (HCC)    CKD (chronic kidney disease), stage II    Coronary artery disease    Diabetes mellitus    Diastolic dysfunction    Diverticulosis    Drug-induced bradycardia 01/28/2016   Hypercholesterolemia    Hypertension    Hyponatremia    Meningioma (HCC)    Osteoarthritis    Sedentary lifestyle    Spinal stenosis    Vertigo, benign positional     Patient Active Problem List   Diagnosis Date Noted   Hyponatremia 07/31/2020   TIA (transient ischemic attack) 06/09/2020   Type 2 diabetes mellitus with hyperlipidemia ([place]) 06/09/2020   Dementia without behavioral disturbance ([place]) 06/09/2020   Presbycusis of both ears 09/14/2016   Drug-induced bradycardia 01/28/2016   Cephalalgia 09/16/2015   Orthostatic dizziness 09/16/2015   Post-nasal drainage 09/16/2015   SOB  (shortness of breath) 07/29/2013   Hypertension    Diastolic dysfunction    Hypercholesterolemia    Diabetes mellitus    Chronic diastolic CHF (congestive heart failure) ([place])     Past Surgical History:  Procedure Laterality Date   BACK SURGERY     BREAST BIOPSY     SKIN FULL THICKNESS GRAFT Right 11/08/2017   Procedure: RIGHT SMALL FINGER ABLATION OF NAIL ANDSKIN GRAFT FULL THICKNESS;  Surgeon: Leanora Cover, MD;  Location: [facility];  Service: Orthopedics;  Laterality: Right;   TONSILLECTOMY      Allergies Ciprofloxacin, Doxycycline hyclate, Penicillins, and Penicillin g  Family History  Problem Relation Age of Onset   Heart disease Mother    Heart attack Mother    Prostate cancer Father    Lung cancer Brother    Pancreatic cancer Brother    Headache Neg Hx     Social History Social History   Tobacco Use   Smoking status: Former    Types: Cigarettes    Quit date: 01/30/1979    Years since quitting: 41.7   Smokeless tobacco: Never  Vaping Use   Vaping Use: Never used  Substance Use Topics   Alcohol use: No   Drug use: No    Review of Systems  Constitutional: No fever/chills Eyes: No visual changes. ENT: No sore throat. Cardiovascular: Denies chest pain. Respiratory: Denies shortness of breath. Gastrointestinal: No abdominal pain.  No nausea, no vomiting.  No diarrhea.  No constipation. Genitourinary: Negative for dysuria. Musculoskeletal: Negative for back pain. Skin: Negative for rash. Neurological: Negative for headaches, focal weakness or numbness.  10-point ROS otherwise negative.  ____________________________________________   PHYSICAL EXAM:  VITAL SIGNS: ED Triage Vitals [10/09/20 0207]  Enc Vitals Group     BP (!) 176/80     Pulse Rate 81     Resp 20     Temp 98.1 F (36.7 C)     Temp Source Oral     SpO2 100 %   Constitutional: Alert but confused. Up and ambulatory around the room and wandering but redirectable.   Eyes: Conjunctivae are normal.  Head: Atraumatic. Nose: No congestion/rhinnorhea. Mouth/Throat: Mucous membranes are moist.  Neck: No stridor.  Cardiovascular: Good peripheral circulation.  Respiratory: Normal respiratory effort.  Gastrointestinal: No distention.  Musculoskeletal: No lower extremity tenderness nor edema. No gross deformities of extremities. Neurologic:  Normal speech and language. No gross focal neurologic deficits are appreciated. Ambulatory without difficulty.  Skin:  Skin is warm, dry and intact. No rash noted.  ____________________________________________   LABS (all labs ordered are listed, but only abnormal results are displayed)  Labs Reviewed  BASIC METABOLIC PANEL - Abnormal; Notable for the following components:      Result Value   Sodium 133 (*)    Glucose, Bld 119 (*)    GFR, Estimated 56 (*)    All other components within normal limits  URINALYSIS, ROUTINE W REFLEX MICROSCOPIC - Abnormal; Notable for the following components:   Color, Urine YELLOW (*)    APPearance HAZY (*)    Hgb urine dipstick MODERATE (*)    Leukocytes,Ua SMEAR ONLY (*)    Bacteria, UA RARE (*)    All other components within normal limits  URINE CULTURE  CBC WITH DIFFERENTIAL/PLATELET   ____________________________________________  RADIOLOGY  None   ____________________________________________   PROCEDURES  Procedure(s) performed:   Procedures  None  ____________________________________________   INITIAL IMPRESSION / ASSESSMENT AND PLAN / ED COURSE  Pertinent labs & imaging results that were available during my care of the patient were reviewed by me and considered in my medical decision making (see chart for details).   Patient presents emergency department with dementia and worsening behavioral disturbance at home.  Lab work does not show a clear medical reason for the patient's worsening symptoms.  Suspect this is more related the patient's ongoing decline  with dementia.  There are rare bacteria and smear leukocytes on the UA.  We will send this for culture but do not feel this is enough to prompt treatment at this time.   Had a Aydee Mcnew discussion with the patient's son at bedside.  It does seem that her dementia is getting to the point where she will soon require additional care either at home or possibly in a memory care facility.  They have been in discussions with the primary care doctor.  In discussing options I will place a home health and social work referral along with social work consultation.  At this hour, I do not have social work in-house to discuss with the family but will consult and have them call for follow up.    ____________________________________________  FINAL CLINICAL IMPRESSION(S) / ED DIAGNOSES  Final diagnoses:  Cognitive and behavioral changes    Note:  This document was prepared using Dragon voice recognition software and may include unintentional dictation errors.  Nanda Quinton, MD, [facility]  Margette Fast, MD 10/09/20 [phone]

## 2020-10-09 NOTE — ED Provider Notes (Signed)
Emergency Medicine Provider Triage Evaluation Note  Brenda Lynch , a 87 y.o. female  was evaluated in triage.  Pt complains of change in behavior.  Hx of dementia, waxing and waning severity of her behaviors recently.  Tonight got very aggressive with son.  No recent medication changes, falls, trauma.  Last incidence like this she had UTI.  She is eating/drinking fine, sleeping a lot which is normal for her.  Review of Systems  Positive: Behavior changes, aggression Negative: Fever, falls  Physical Exam  BP (!) 176/80 (BP Location: Left Arm)   Pulse 81   Temp 98.1 F (36.7 C) (Oral)   Resp 20   SpO2 100%  Gen:   Awake, no distress   Resp:  Normal effort  MSK:   Moves extremities without difficulty  Other:    Medical Decision Making  Medically screening exam initiated at 2:37 AM.  Appropriate orders placed.  Brenda Lynch was informed that the remainder of the evaluation will be completed by another provider, this initial triage assessment does not replace that evaluation, and the importance of remaining in the ED until their evaluation is complete.  Will check screening labs and UA w/culture.   [name], PA-C 10/09/20 [id number]    Margette Fast, MD 10/09/20 [phone]

## 2020-10-09 NOTE — Discharge Instructions (Addendum)
The social worker will be in contact to help direct you toward area resources. I am not seeing a clear reason for your mother's symptoms and suspect this is worsening dementia.

## 2020-10-09 NOTE — ED Notes (Signed)
Pt discharged under care of pt guardian and son,Brenda Lynch with no further questions. Pt ambulatory to baseline out of ED.

## 2020-10-09 NOTE — ED Triage Notes (Signed)
Pts son reports pt has hx of dementia. Pt became physically aggressive for the first time tonight and was refusing to go to bed or listen to family like she normally would. Pts son brought her here for further evaluation.

## 2020-10-10 LAB — URINE CULTURE: Culture: <10000 — AB

## 2020-10-13 ENCOUNTER — Telehealth: Payer: Self-pay | Admitting: Adult Health

## 2020-10-13 NOTE — Telephone Encounter (Signed)
Spoke with her son, Josph Macho on [place] and advised that we haven't seen patient for dementia or behavior issues. I advised he discuss with her PCP to decide any treatments. He verbalized understanding, appreciation.

## 2020-10-13 NOTE — Telephone Encounter (Signed)
Pt's son states pt is becoming more belligerent and even throwing things.  Son would like a call to discuss pt being put on some type of medication for her mood

## 2020-10-14 ENCOUNTER — Emergency Department (HOSPITAL_COMMUNITY)
Admission: EM | Admit: 2020-10-14 | Discharge: 2020-10-14 | Disposition: A | Discharge status: Home / Self Care | Payer: Medicare PPO | Attending: Emergency Medicine | Admitting: Emergency Medicine

## 2020-10-14 ENCOUNTER — Emergency Department (HOSPITAL_COMMUNITY): Payer: Medicare PPO

## 2020-10-14 DIAGNOSIS — Z7902 Long term (current) use of antithrombotics/antiplatelets: Secondary | ICD-10-CM | POA: Diagnosis not present

## 2020-10-14 DIAGNOSIS — G4489 Other headache syndrome: Secondary | ICD-10-CM | POA: Diagnosis not present

## 2020-10-14 DIAGNOSIS — R29810 Facial weakness: Secondary | ICD-10-CM | POA: Diagnosis not present

## 2020-10-14 DIAGNOSIS — D329 Benign neoplasm of meninges, unspecified: Secondary | ICD-10-CM | POA: Diagnosis not present

## 2020-10-14 DIAGNOSIS — E785 Hyperlipidemia, unspecified: Secondary | ICD-10-CM | POA: Insufficient documentation

## 2020-10-14 DIAGNOSIS — I130 Hypertensive heart and chronic kidney disease with heart failure and stage 1 through stage 4 chronic kidney disease, or unspecified chronic kidney disease: Secondary | ICD-10-CM | POA: Diagnosis not present

## 2020-10-14 DIAGNOSIS — R404 Transient alteration of awareness: Secondary | ICD-10-CM

## 2020-10-14 DIAGNOSIS — Z79899 Other long term (current) drug therapy: Secondary | ICD-10-CM | POA: Diagnosis not present

## 2020-10-14 DIAGNOSIS — F0391 Unspecified dementia with behavioral disturbance: Secondary | ICD-10-CM

## 2020-10-14 DIAGNOSIS — Z7982 Long term (current) use of aspirin: Secondary | ICD-10-CM | POA: Diagnosis not present

## 2020-10-14 DIAGNOSIS — Z87891 Personal history of nicotine dependence: Secondary | ICD-10-CM | POA: Insufficient documentation

## 2020-10-14 DIAGNOSIS — I5032 Chronic diastolic (congestive) heart failure: Secondary | ICD-10-CM | POA: Insufficient documentation

## 2020-10-14 DIAGNOSIS — I10 Essential (primary) hypertension: Secondary | ICD-10-CM | POA: Diagnosis not present

## 2020-10-14 DIAGNOSIS — G9389 Other specified disorders of brain: Secondary | ICD-10-CM | POA: Diagnosis not present

## 2020-10-14 DIAGNOSIS — R531 Weakness: Secondary | ICD-10-CM | POA: Diagnosis not present

## 2020-10-14 DIAGNOSIS — Z86011 Personal history of benign neoplasm of the brain: Secondary | ICD-10-CM | POA: Diagnosis not present

## 2020-10-14 DIAGNOSIS — G319 Degenerative disease of nervous system, unspecified: Secondary | ICD-10-CM | POA: Diagnosis not present

## 2020-10-14 DIAGNOSIS — E1169 Type 2 diabetes mellitus with other specified complication: Secondary | ICD-10-CM | POA: Diagnosis not present

## 2020-10-14 DIAGNOSIS — N182 Chronic kidney disease, stage 2 (mild): Secondary | ICD-10-CM | POA: Diagnosis not present

## 2020-10-14 DIAGNOSIS — F0390 Unspecified dementia without behavioral disturbance: Secondary | ICD-10-CM | POA: Insufficient documentation

## 2020-10-14 DIAGNOSIS — I2510 Atherosclerotic heart disease of native coronary artery without angina pectoris: Secondary | ICD-10-CM | POA: Diagnosis not present

## 2020-10-14 DIAGNOSIS — R4689 Other symptoms and signs involving appearance and behavior: Secondary | ICD-10-CM | POA: Diagnosis present

## 2020-10-14 DIAGNOSIS — I739 Peripheral vascular disease, unspecified: Secondary | ICD-10-CM | POA: Diagnosis not present

## 2020-10-14 LAB — BASIC METABOLIC PANEL
Anion gap: 6 (ref 5–15)
BUN: 10 mg/dL (ref 8–23)
CO2: 28 mmol/L (ref 22–32)
Calcium: 8.9 mg/dL (ref 8.9–10.3)
Chloride: 100 mmol/L (ref 98–111)
Creatinine, Ser: 0.98 mg/dL (ref 0.44–1.00)
GFR, Estimated: 53 mL/min — ABNORMAL LOW (ref >60)
Glucose, Bld: 98 mg/dL (ref 70–99)
Potassium: 4.3 mmol/L (ref 3.5–5.1)
Sodium: 134 mmol/L — ABNORMAL LOW (ref 135–145)

## 2020-10-14 LAB — URINALYSIS, ROUTINE W REFLEX MICROSCOPIC
Bilirubin Urine: NEGATIVE
Glucose, UA: NEGATIVE mg/dL
Ketones, ur: NEGATIVE mg/dL
Leukocytes,Ua: NEGATIVE
Nitrite: NEGATIVE
Protein, ur: NEGATIVE mg/dL
Specific Gravity, Urine: 1.006 (ref 1.005–1.030)
pH: 7 (ref 5.0–8.0)

## 2020-10-14 LAB — CBC WITH DIFFERENTIAL/PLATELET
Abs Immature Granulocytes: 0.01 10*3/uL (ref 0.00–0.07)
Basophils Absolute: 0.1 10*3/uL (ref 0.0–0.1)
Basophils Relative: 1 %
Eosinophils Absolute: 0.1 10*3/uL (ref 0.0–0.5)
Eosinophils Relative: 1 %
HCT: 34.9 % — ABNORMAL LOW (ref 36.0–46.0)
Hemoglobin: 11.6 g/dL — ABNORMAL LOW (ref 12.0–15.0)
Immature Granulocytes: 0 %
Lymphocytes Relative: 29 %
Lymphs Abs: 1.8 10*3/uL (ref 0.7–4.0)
MCH: 30 pg (ref 26.0–34.0)
MCHC: 33.2 g/dL (ref 30.0–36.0)
MCV: 90.2 fL (ref 80.0–100.0)
Monocytes Absolute: 0.4 10*3/uL (ref 0.1–1.0)
Monocytes Relative: 7 %
Neutro Abs: 3.8 10*3/uL (ref 1.7–7.7)
Neutrophils Relative %: 62 %
Platelets: 346 10*3/uL (ref 150–400)
RBC: 3.87 MIL/uL (ref 3.87–5.11)
RDW: 14.5 % (ref 11.5–15.5)
WBC: 6.1 10*3/uL (ref 4.0–10.5)
nRBC: 0 % (ref 0.0–0.2)

## 2020-10-14 NOTE — ED Triage Notes (Signed)
Pt here via EMS d/t pt having a "bad day". History of dementia. Son said pt was not talking a lot to people and wanted her checked out. 164/76 HR 55 96% RA CBG 124

## 2020-10-14 NOTE — Social Work (Signed)
CSW met with Pt and son, Brenda Lynch at bedside.  Per son, Pt had an episode where she was not able to be redirected and was acting agitated. Pt does have some in-home care through [facility].  Son and his brother are working to coordinate more hours through long-term care insurance as well as other methods.  CSW provided in-home services resource list. Son declined memory care resource list as current plan is to continue to provide care for Pt at home as long as possible.

## 2020-10-14 NOTE — Discharge Instructions (Addendum)
Follow-up with primary care provider and neurology  Return for new or worsening symptoms

## 2020-10-14 NOTE — ED Provider Notes (Addendum)
[facility] EMERGENCY DEPARTMENT Provider Note   CSN: [id number] Arrival date & time: 10/14/20  1241     History Bad day/ Demential behavioral issues  Brenda Lynch is a 87 y.o. female with past medical history significant for CHF, CKD, CAD, dementia who presents for evaluation of behavioral issues.  Seen here in ED 5 days ago.  At that time home health orders have been placed.  He has been having intermittent episodes of "outburst."  Level 5 caveat- Dementia  Collateral from son Kindyl Barsch, Brooke Bonito   Patient with history of dementia.  Has been having intermittent outbursts.  They have home health during the day however none at night.  Occasionally has difficulty sleeping, becomes paranoid and hallucinates thinking  "house was on fire" .  She had a "bad day" yesterday.  However slept fine last night.  Woke up this morning had a good breakfast.  Apparently family was of the room when she called out for the son. When son arrived patient was staring into space and not speaking.  She did not have any shaking activity, tongue biting, one sided facial droop.  States she was recently admitted 2 months ago for TIA and 2 month prior to that. 3 TIA work ups in last 6 months. EEG at that time was performed.  She did not have any unilateral deficits at that time per son.   HPI     Past Medical History:  Diagnosis Date   Allergic rhinitis    Chronic diastolic CHF (congestive heart failure) ([place])    CKD (chronic kidney disease), stage II    Coronary artery disease    Diabetes mellitus    Diastolic dysfunction    Diverticulosis    Drug-induced bradycardia 01/28/2016   Hypercholesterolemia    Hypertension    Hyponatremia    Meningioma (HCC)    Osteoarthritis    Sedentary lifestyle    Spinal stenosis    Vertigo, benign positional     Patient Active Problem List   Diagnosis Date Noted   Hyponatremia 07/31/2020   TIA (transient ischemic attack) 06/09/2020   Type 2 diabetes  mellitus with hyperlipidemia ([place]) 06/09/2020   Dementia without behavioral disturbance ([place]) 06/09/2020   Presbycusis of both ears 09/14/2016   Drug-induced bradycardia 01/28/2016   Cephalalgia 09/16/2015   Orthostatic dizziness 09/16/2015   Post-nasal drainage 09/16/2015   SOB (shortness of breath) 07/29/2013   Hypertension    Diastolic dysfunction    Hypercholesterolemia    Diabetes mellitus    Chronic diastolic CHF (congestive heart failure) ([place])     Past Surgical History:  Procedure Laterality Date   BACK SURGERY     BREAST BIOPSY     SKIN FULL THICKNESS GRAFT Right 11/08/2017   Procedure: RIGHT SMALL FINGER ABLATION OF NAIL ANDSKIN GRAFT FULL THICKNESS;  Surgeon: Leanora Cover, MD;  Location: [facility];  Service: Orthopedics;  Laterality: Right;   TONSILLECTOMY       OB History   No obstetric history on file.     Family History  Problem Relation Age of Onset   Heart disease Mother    Heart attack Mother    Prostate cancer Father    Lung cancer Brother    Pancreatic cancer Brother    Headache Neg Hx     Social History   Tobacco Use   Smoking status: Former    Types: Cigarettes    Quit date: 01/30/1979    Years since quitting: 43.7  Smokeless tobacco: Never  Vaping Use   Vaping Use: Never used  Substance Use Topics   Alcohol use: No   Drug use: No    Home Medications Prior to Admission medications   Medication Sig Start Date End Date Taking? Authorizing Provider  acetaminophen (TYLENOL) 500 MG tablet Take 500 mg by mouth every 6 (six) hours as needed (for headaches or mild pain).    [provider]  aspirin 325 MG tablet Take 1 tablet (325 mg total) by mouth daily. Patient not taking: No sig reported 06/12/20   Hosie Poisson, MD  atorvastatin (LIPITOR) 20 MG tablet Take 1 tablet (20 mg total) by mouth daily. Patient not taking: No sig reported 06/12/20   Hosie Poisson, MD  calcium carbonate (TUMS - DOSED IN MG ELEMENTAL CALCIUM)  500 MG chewable tablet Chew 1 tablet by mouth daily as needed for indigestion or heartburn.    [provider]  cetirizine (ZYRTEC) 10 MG tablet Take 10 mg by mouth at bedtime.    [provider]  Cholecalciferol (VITAMIN D3) 25 MCG (1000 UT) CAPS Take 1,000 Units by mouth in the morning.    [provider]  clopidogrel (PLAVIX) 75 MG tablet Take 1 tablet (75 mg total) by mouth daily. 06/12/20   Hosie Poisson, MD  estradiol (ESTRACE) 0.1 MG/GM vaginal cream Place 1 Applicatorful vaginally See admin instructions. Place 1 applicatorful vaginally 2 times a week as needed for dryness    [provider]  fluticasone (FLONASE) 50 MCG/ACT nasal spray Place 1-2 sprays into both nostrils daily as needed for allergies or rhinitis.    [provider]  hydrochlorothiazide (MICROZIDE) 12.5 MG capsule TAKE (1) CAPSULE DAILY. Patient taking differently: Take 12.5 mg by mouth daily. 05/25/20   Sueanne Margarita, MD  hydrocortisone (ANUSOL-HC) 25 MG suppository Place 25 mg rectally 2 (two) times daily as needed for hemorrhoids or itching.     [provider]  Lancets [facility] ULTRASOFT) lancets 1 each by Other route as directed.  05/01/14   [provider]  loperamide (IMODIUM A-D) 2 MG tablet Take 2 mg by mouth daily as needed for diarrhea or loose stools.    [provider]  MELATONIN PO Take 1 tablet by mouth at bedtime.    [provider]  Misc Natural Products (OSTEO BI-FLEX ADV DOUBLE ST PO) Take 1 tablet by mouth daily.    [provider]  montelukast (SINGULAIR) 10 MG tablet Take 10 mg by mouth at bedtime. 12/12/12   [provider]  NONFORMULARY OR COMPOUNDED ITEM Place 1 application rectally See admin instructions. Diltiazem 2% ointment, compounded- APPLY TOPICALLY TO ANUS FOUR TIMES DAILY 06/24/20   [provider]  ONE TOUCH ULTRA TEST test strip 1 each by Other route as directed.  06/21/14   [provider]  Polyethyl Glyc-Propyl Glyc PF (SYSTANE HYDRATION PF) 0.4-0.3 % SOLN Place 1 drop into both eyes 3 (three) times daily as needed (for irritation).    [provider]  polyethylene glycol powder (GLYCOLAX/MIRALAX) 17 GM/SCOOP powder Take 8.5-17 g by mouth daily as needed for mild constipation (MIX AND DRINK).    [provider]  potassium chloride (MICRO-K) 10 MEQ CR capsule Take 10 mEq by mouth in the morning.  07/17/14   [provider]  PRILOSEC OTC 20 MG tablet Take 20 mg by mouth daily before breakfast.    [provider]  ramipril (ALTACE) 10 MG capsule Take 10 mg by mouth  in the morning.  10/06/19   [provider]  Saline (SIMPLY SALINE) 0.9 % AERS Place 1 spray into both nostrils as needed (for congestion).    [provider]    Allergies    Ciprofloxacin, Doxycycline hyclate, Penicillins, and Penicillin g  Review of Systems   Review of Systems  Unable to perform ROS: Dementia  All other systems reviewed and are negative.  Physical Exam Updated Vital Signs BP (!) 150/126   Pulse (!) 52   Temp 98.3 F (36.8 C) (Oral)   Resp 13   SpO2 100%   Physical Exam Vitals and nursing note reviewed.  Constitutional:      General: She is not in acute distress.    Appearance: She is well-developed. She is not ill-appearing, toxic-appearing or diaphoretic.     Comments: Difficult exam, patient hard of hearing without hearing aids in  HENT:     Head: Normocephalic and atraumatic.     Nose: Nose normal.     Mouth/Throat:     Mouth: Mucous membranes are moist.  Eyes:     Pupils: Pupils are equal, round, and reactive to light.  Cardiovascular:     Rate and Rhythm: Normal rate.  Pulmonary:     Effort: No respiratory distress.  Abdominal:     General: There is no distension.  Musculoskeletal:        General: Normal range of motion.     Cervical back: Normal range of motion.  Skin:    General: Skin is warm and dry.   Neurological:     Mental Status: She is alert. Mental status is at baseline.     Cranial Nerves: Cranial nerves are intact.     Sensory: Sensation is intact.     Motor: Motor function is intact.     Coordination: Coordination is intact.     Gait: Gait is intact.     Comments: Cranial nerves 2-12 grossly intact Equal handgrip Moves all 4 extremities without difficulty Alert to person, date of birth  Psychiatric:        Mood and Affect: Mood normal.    ED Results / Procedures / Treatments   Labs (all labs ordered are listed, but only abnormal results are displayed) Labs Reviewed  CBC WITH DIFFERENTIAL/PLATELET - Abnormal; Notable for the following components:      Result Value   Hemoglobin 11.6 (*)    HCT 34.9 (*)    All other components within normal limits  BASIC METABOLIC PANEL - Abnormal; Notable for the following components:   Sodium 134 (*)    GFR, Estimated 53 (*)    All other components within normal limits  URINALYSIS, ROUTINE W REFLEX MICROSCOPIC - Abnormal; Notable for the following components:   Color, Urine STRAW (*)    Hgb urine dipstick SMALL (*)    Bacteria, UA RARE (*)    All other components within normal limits  RESP PANEL BY RT-PCR (FLU A&B, COVID) [id number]    EKG None  Radiology CT Head Wo Contrast  Result Date: 10/14/2020 CLINICAL DATA:  TIA EXAM: CT HEAD WITHOUT CONTRAST TECHNIQUE: Contiguous axial images were obtained from the base of the skull through the vertex without intravenous contrast. COMPARISON:  07/31/2020 FINDINGS: Brain: Calcified midline structure along the anterior falx is again noted, measuring 1.4 cm compatible with meningioma. There is atrophy and chronic small vessel disease changes. No hemorrhage, hydrocephalus or acute infarction. Vascular: No hyperdense vessel or unexpected calcification. Skull: No acute calvarial abnormality.  Sinuses/Orbits: No acute findings Other: None IMPRESSION: Atrophy, chronic microvascular disease. No acute  intracranial abnormality. Stable 13 mm anterior falcine meningioma. Electronically Signed   By: Rolm Baptise M.D.   On: 10/14/2020 16:07    Procedures Procedures   Medications Ordered in ED Medications - No data to display  ED Course  I have reviewed the triage vital signs and the nursing notes.  Pertinent labs & imaging results that were available during my care of the patient were reviewed by me and considered in my medical decision making (see chart for details).  Here for evaluation of possible staring into space episode earlier today, resolved prior to EMS arrival. History of dementia with increasing frequent outburst ad behavioral issues. Seen here in ED 4 days ago. Apparently had a very bad day yesterday with agitation . Woke up fine this morning, called out to son, stared into space for approximately 5 minutes without UNilateral droop or weakness. Self resolved prior to EMS arrival.  She is currently at her baseline mentation.  She has no obvious deficits on exam aside from her baseline memory issues per son.  She has no unilateal weakness.   Will obtain labs, urine, imaging and reassess:  CBC without leukocytosis Metabolic panel with sodium 134 UA negative for infection CT negative for acute infarct  MRI 06/09/20, 06/15/20, 07/31/20>> No large infarcts however all exams had motion  Patient continues to be at baseline.  She was observed here in the emergency department without any acute abnormality.  Unclear etiology of symptoms.  Has had 3 TIA work-ups over the last 6 months as well as in-house EEG without any significant normality.  Reassuring history and exam.  She is followed by neurology in the outpatient setting.  Question if she had seizure like activity given staring into space?.  Reassuring EEG 6 months ago.  No obvious infectious process as cause of her symptoms.  Compliant with meds from prior admission for TIA  Son states at baseline patient needs help with some  ADLs  TOC has seen patient.  They provided patient with outpatient resources.  Son prefers to take patient home.  I feel this is reasonable given followed by Neuro and has appointment in 2 weeks, per son in room. Do think she probably needs to get on some long-term medications to help with her increasing behavioral outburst.  Family does not want memory care facility at this time and would like to maximize outpatient benefits.  Discussed with attending who is in agreement with above treatment, plan and disposition.  The patient has been appropriately medically screened and/or stabilized in the ED. I have low suspicion for any other emergent medical condition which would require further screening, evaluation or treatment in the ED or require inpatient management.  Patient is hemodynamically stable and in no acute distress.  Patient able to ambulate in department prior to ED.  Evaluation does not show acute pathology that would require ongoing or additional emergent interventions while in the emergency department or further inpatient treatment.  I have discussed the diagnosis with the patient and answered all questions.  Pain is been managed while in the emergency department and patient has no further complaints prior to discharge.  Patient is comfortable with plan discussed in room and is stable for discharge at this time.  I have discussed strict return precautions for returning to the emergency department.  Patient was encouraged to follow-up with PCP/specialist refer to at discharge.      MDM Rules/Calculators/A&P  Final Clinical Impression(s) / ED Diagnoses Final diagnoses:  Episodes of staring  Dementia with behavioral disturbance, unspecified dementia type [facility])    Rx / DC Orders ED Discharge Orders     None        Jaxn Chiquito A, PA-C 10/14/20 1708    Keylee Shrestha A, PA-C 10/14/20 1712    Gareth Morgan, MD 10/14/20 2315

## 2020-11-04 ENCOUNTER — Ambulatory Visit: Payer: Medicare PPO | Admitting: Adult Health

## 2020-11-10 DIAGNOSIS — E7800 Pure hypercholesterolemia, unspecified: Secondary | ICD-10-CM | POA: Diagnosis not present

## 2020-11-10 DIAGNOSIS — K219 Gastro-esophageal reflux disease without esophagitis: Secondary | ICD-10-CM | POA: Diagnosis not present

## 2020-11-10 DIAGNOSIS — E1122 Type 2 diabetes mellitus with diabetic chronic kidney disease: Secondary | ICD-10-CM | POA: Diagnosis not present

## 2020-11-10 DIAGNOSIS — I5030 Unspecified diastolic (congestive) heart failure: Secondary | ICD-10-CM | POA: Diagnosis not present

## 2020-11-10 DIAGNOSIS — M1990 Unspecified osteoarthritis, unspecified site: Secondary | ICD-10-CM | POA: Diagnosis not present

## 2020-11-10 DIAGNOSIS — M8580 Other specified disorders of bone density and structure, unspecified site: Secondary | ICD-10-CM | POA: Diagnosis not present

## 2020-11-10 DIAGNOSIS — E1142 Type 2 diabetes mellitus with diabetic polyneuropathy: Secondary | ICD-10-CM | POA: Diagnosis not present

## 2020-11-10 DIAGNOSIS — D509 Iron deficiency anemia, unspecified: Secondary | ICD-10-CM | POA: Diagnosis not present

## 2020-11-10 DIAGNOSIS — I10 Essential (primary) hypertension: Secondary | ICD-10-CM | POA: Diagnosis not present

## 2020-11-15 DIAGNOSIS — R32 Unspecified urinary incontinence: Secondary | ICD-10-CM | POA: Diagnosis not present

## 2020-11-15 DIAGNOSIS — L989 Disorder of the skin and subcutaneous tissue, unspecified: Secondary | ICD-10-CM | POA: Diagnosis not present

## 2020-11-15 DIAGNOSIS — N9489 Other specified conditions associated with female genital organs and menstrual cycle: Secondary | ICD-10-CM | POA: Diagnosis not present

## 2020-11-15 DIAGNOSIS — R269 Unspecified abnormalities of gait and mobility: Secondary | ICD-10-CM | POA: Diagnosis not present

## 2020-11-15 DIAGNOSIS — F0390 Unspecified dementia without behavioral disturbance: Secondary | ICD-10-CM | POA: Diagnosis not present

## 2020-11-16 ENCOUNTER — Other Ambulatory Visit: Payer: Self-pay

## 2020-11-16 ENCOUNTER — Encounter: Payer: Self-pay | Admitting: Podiatry

## 2020-11-16 ENCOUNTER — Ambulatory Visit (INDEPENDENT_AMBULATORY_CARE_PROVIDER_SITE_OTHER): Payer: Medicare PPO | Admitting: Podiatry

## 2020-11-16 DIAGNOSIS — E1169 Type 2 diabetes mellitus with other specified complication: Secondary | ICD-10-CM

## 2020-11-16 DIAGNOSIS — M79675 Pain in left toe(s): Secondary | ICD-10-CM | POA: Diagnosis not present

## 2020-11-16 DIAGNOSIS — E785 Hyperlipidemia, unspecified: Secondary | ICD-10-CM

## 2020-11-16 DIAGNOSIS — L84 Corns and callosities: Secondary | ICD-10-CM

## 2020-11-16 DIAGNOSIS — M79674 Pain in right toe(s): Secondary | ICD-10-CM

## 2020-11-16 DIAGNOSIS — B351 Tinea unguium: Secondary | ICD-10-CM | POA: Diagnosis not present

## 2020-11-16 DIAGNOSIS — L909 Atrophic disorder of skin, unspecified: Secondary | ICD-10-CM

## 2020-11-16 NOTE — Patient Instructions (Signed)
Ms. Steinbrink,  Do not wear any leather shoes. They cause pressure spots on your feet.  Wear your new Skechers shoes daily.  Mr. Ludd, She has skin and fat atrophy of her feet which predisposes her to pressure areas. I see nothing of concern on today's visit.  She may benefit from heel protectors which she can wear when she is in bed to prevent any pressure sores.

## 2020-11-19 NOTE — Progress Notes (Signed)
Subjective: Brenda Lynch is a 87 y.o. female patient seen today for follow up of  painful thick toenails that are difficult to trim. Pain interferes with ambulation. Aggravating factors include wearing enclosed shoe gear. Pain is relieved with periodic professional debridement.  Patient presents with caregiver on today's visit. Caregiver states patient's son is concerned about redness on the bottom of [name]'s feet. They notes no drainage or swelling.   [name] states she doesn't understand why this visit is necessary. She states she did not know why she was coming to see me.   PCP is [name], MD. Last visit was: 11/15/2020.  Allergies  Allergen Reactions   Ciprofloxacin Nausea Only and Other (See Comments)    GI intolerance    Doxycycline Hyclate Other (See Comments)    BS elev, Head pressure    Penicillins Hives and Other (See Comments)    Welts, also Has patient had a PCN reaction causing immediate rash, facial/tongue/throat swelling, SOB or lightheadedness with hypotension: Yes Has patient had a PCN reaction causing severe rash involving mucus membranes or skin necrosis: No Has patient had a PCN reaction that required hospitalization No Has patient had a PCN reaction occurring within the last 10 years: No If all of the above answers are "NO", then may proceed with Cephalosporin use.   Penicillin G Hives and Other (See Comments)    Welts on the body, also     PCP is [name], MD .  Objective: Physical Exam  General: Patient is a pleasant 87 y.o. African American female, thin build, in NAD. AAO x 3.   Neurovascular Examination: Capillary refill time to digits immediate b/l. Palpable pedal pulses b/l LE. Pedal hair present. Lower extremity skin temperature gradient within normal limits. No edema noted b/l lower extremities.   Protective sensation intact 5/5 intact bilaterally with 10g monofilament b/l. Vibratory sensation intact b/l.  Dermatological:   Pedal skin is thin and atrophied b/l. There is moderate fat pad atrophy of the plantar aspect of both feet. Central hyperkeratotic lesion on the plantar aspect of her left heel.  No open wounds b/l lower extremities. No interdigital macerations b/l lower extremities. Toenails 1-5 b/l elongated, discolored, dystrophic, thickened, crumbly with subungual debris and tenderness to dorsal palpation.  Musculoskeletal:  Normal muscle strength 5/5 to all lower extremity muscle groups bilaterally. No pain crepitus or joint limitation noted with ROM b/l lower extremities.  Gross deformities: None  Assessment: 1. Pain due to onychomycosis of toenails of both feet   2. Fat pad atrophy of foot   3. Callus   4. Type 2 diabetes mellitus with hyperlipidemia (HCC)    Plan: -Examined patient. -Advised [name] to avoid wearing leather shoes. She was instructed to wear her Skechers shoes daily. Sent message to her son she has fat atrophy of her feet which presdisposes her to pressure areas. No areas of concern found on today's visit. Patient may benefit from heel protectors when she is in bed to avoid pressure sores. -Toenails 1-5 b/l were debrided in length and girth with sterile nail nippers and dremel without iatrogenic bleeding.  -Callus(es) plantar aspect of heel left foot pared utilizing sterile scalpel blade without complication or incident. Total number debrided =1. -Patient to report any pedal injuries to medical professional immediately. -Patient/POA to call should there be question/concern in the interim.  Return in about 3 months (around 02/16/2021).  Marzetta Board, DPM

## 2020-12-16 DIAGNOSIS — Z01419 Encounter for gynecological examination (general) (routine) without abnormal findings: Secondary | ICD-10-CM | POA: Diagnosis not present

## 2020-12-17 DIAGNOSIS — G4700 Insomnia, unspecified: Secondary | ICD-10-CM | POA: Diagnosis not present

## 2020-12-17 DIAGNOSIS — I509 Heart failure, unspecified: Secondary | ICD-10-CM | POA: Diagnosis not present

## 2020-12-17 DIAGNOSIS — G8929 Other chronic pain: Secondary | ICD-10-CM | POA: Diagnosis not present

## 2020-12-17 DIAGNOSIS — E785 Hyperlipidemia, unspecified: Secondary | ICD-10-CM | POA: Diagnosis not present

## 2020-12-17 DIAGNOSIS — Z880 Allergy status to penicillin: Secondary | ICD-10-CM | POA: Diagnosis not present

## 2020-12-17 DIAGNOSIS — Z87891 Personal history of nicotine dependence: Secondary | ICD-10-CM | POA: Diagnosis not present

## 2020-12-17 DIAGNOSIS — J309 Allergic rhinitis, unspecified: Secondary | ICD-10-CM | POA: Diagnosis not present

## 2020-12-17 DIAGNOSIS — Z8673 Personal history of transient ischemic attack (TIA), and cerebral infarction without residual deficits: Secondary | ICD-10-CM | POA: Diagnosis not present

## 2020-12-17 DIAGNOSIS — E1142 Type 2 diabetes mellitus with diabetic polyneuropathy: Secondary | ICD-10-CM | POA: Diagnosis not present

## 2020-12-17 DIAGNOSIS — K219 Gastro-esophageal reflux disease without esophagitis: Secondary | ICD-10-CM | POA: Diagnosis not present

## 2020-12-17 DIAGNOSIS — I110 Hypertensive heart disease with heart failure: Secondary | ICD-10-CM | POA: Diagnosis not present

## 2020-12-17 DIAGNOSIS — H04129 Dry eye syndrome of unspecified lacrimal gland: Secondary | ICD-10-CM | POA: Diagnosis not present

## 2021-01-06 ENCOUNTER — Emergency Department (HOSPITAL_COMMUNITY)
Admission: EM | Admit: 2021-01-06 | Discharge: 2021-01-06 | Disposition: A | Discharge status: Home / Self Care | Payer: Medicare PPO | Attending: Emergency Medicine | Admitting: Emergency Medicine

## 2021-01-06 ENCOUNTER — Encounter (HOSPITAL_COMMUNITY): Payer: Self-pay

## 2021-01-06 ENCOUNTER — Other Ambulatory Visit: Payer: Self-pay

## 2021-01-06 DIAGNOSIS — F0390 Unspecified dementia without behavioral disturbance: Secondary | ICD-10-CM | POA: Diagnosis not present

## 2021-01-06 DIAGNOSIS — N182 Chronic kidney disease, stage 2 (mild): Secondary | ICD-10-CM | POA: Insufficient documentation

## 2021-01-06 DIAGNOSIS — Z87891 Personal history of nicotine dependence: Secondary | ICD-10-CM | POA: Diagnosis not present

## 2021-01-06 DIAGNOSIS — Z79899 Other long term (current) drug therapy: Secondary | ICD-10-CM | POA: Insufficient documentation

## 2021-01-06 DIAGNOSIS — W19XXXA Unspecified fall, initial encounter: Secondary | ICD-10-CM | POA: Diagnosis not present

## 2021-01-06 DIAGNOSIS — I2510 Atherosclerotic heart disease of native coronary artery without angina pectoris: Secondary | ICD-10-CM | POA: Diagnosis not present

## 2021-01-06 DIAGNOSIS — Z20822 Contact with and (suspected) exposure to covid-19: Secondary | ICD-10-CM | POA: Insufficient documentation

## 2021-01-06 DIAGNOSIS — I5032 Chronic diastolic (congestive) heart failure: Secondary | ICD-10-CM | POA: Diagnosis not present

## 2021-01-06 DIAGNOSIS — D72829 Elevated white blood cell count, unspecified: Secondary | ICD-10-CM | POA: Diagnosis not present

## 2021-01-06 DIAGNOSIS — Z7982 Long term (current) use of aspirin: Secondary | ICD-10-CM | POA: Diagnosis not present

## 2021-01-06 DIAGNOSIS — I130 Hypertensive heart and chronic kidney disease with heart failure and stage 1 through stage 4 chronic kidney disease, or unspecified chronic kidney disease: Secondary | ICD-10-CM | POA: Insufficient documentation

## 2021-01-06 DIAGNOSIS — R404 Transient alteration of awareness: Secondary | ICD-10-CM | POA: Insufficient documentation

## 2021-01-06 DIAGNOSIS — R4020 Unspecified coma: Secondary | ICD-10-CM | POA: Diagnosis not present

## 2021-01-06 DIAGNOSIS — R4689 Other symptoms and signs involving appearance and behavior: Secondary | ICD-10-CM | POA: Diagnosis not present

## 2021-01-06 DIAGNOSIS — Z7902 Long term (current) use of antithrombotics/antiplatelets: Secondary | ICD-10-CM | POA: Diagnosis not present

## 2021-01-06 DIAGNOSIS — I10 Essential (primary) hypertension: Secondary | ICD-10-CM | POA: Diagnosis not present

## 2021-01-06 LAB — BASIC METABOLIC PANEL
Anion gap: 7 (ref 5–15)
BUN: 14 mg/dL (ref 8–23)
CO2: 26 mmol/L (ref 22–32)
Calcium: 9 mg/dL (ref 8.9–10.3)
Chloride: 102 mmol/L (ref 98–111)
Creatinine, Ser: 1.04 mg/dL — ABNORMAL HIGH (ref 0.44–1.00)
GFR, Estimated: 49 mL/min — ABNORMAL LOW (ref >60)
Glucose, Bld: 104 mg/dL — ABNORMAL HIGH (ref 70–99)
Potassium: 3.9 mmol/L (ref 3.5–5.1)
Sodium: 135 mmol/L (ref 135–145)

## 2021-01-06 LAB — CBC WITH DIFFERENTIAL/PLATELET
Abs Immature Granulocytes: 0.01 10*3/uL (ref 0.00–0.07)
Basophils Absolute: 0.1 10*3/uL (ref 0.0–0.1)
Basophils Relative: 1 %
Eosinophils Absolute: 0.1 10*3/uL (ref 0.0–0.5)
Eosinophils Relative: 2 %
HCT: 33.9 % — ABNORMAL LOW (ref 36.0–46.0)
Hemoglobin: 10.8 g/dL — ABNORMAL LOW (ref 12.0–15.0)
Immature Granulocytes: 0 %
Lymphocytes Relative: 27 %
Lymphs Abs: 1.8 10*3/uL (ref 0.7–4.0)
MCH: 29.8 pg (ref 26.0–34.0)
MCHC: 31.9 g/dL (ref 30.0–36.0)
MCV: 93.4 fL (ref 80.0–100.0)
Monocytes Absolute: 0.5 10*3/uL (ref 0.1–1.0)
Monocytes Relative: 8 %
Neutro Abs: 4.1 10*3/uL (ref 1.7–7.7)
Neutrophils Relative %: 62 %
Platelets: 272 10*3/uL (ref 150–400)
RBC: 3.63 MIL/uL — ABNORMAL LOW (ref 3.87–5.11)
RDW: 15.9 % — ABNORMAL HIGH (ref 11.5–15.5)
WBC: 6.6 10*3/uL (ref 4.0–10.5)
nRBC: 0 % (ref 0.0–0.2)

## 2021-01-06 LAB — URINALYSIS, ROUTINE W REFLEX MICROSCOPIC
Bilirubin Urine: NEGATIVE
Glucose, UA: NEGATIVE mg/dL
Ketones, ur: NEGATIVE mg/dL
Nitrite: NEGATIVE
Protein, ur: NEGATIVE mg/dL
Specific Gravity, Urine: 1.016 (ref 1.005–1.030)
pH: 6 (ref 5.0–8.0)

## 2021-01-06 LAB — RESP PANEL BY RT-PCR (FLU A&B, COVID) ARPGX2
Influenza A by PCR: NEGATIVE
Influenza B by PCR: NEGATIVE
SARS Coronavirus 2 by RT PCR: NEGATIVE

## 2021-01-06 LAB — TROPONIN I (HIGH SENSITIVITY): Troponin I (High Sensitivity): 8 ng/L (ref <18)

## 2021-01-06 NOTE — ED Notes (Signed)
Reviewed discharge instructions with patient and son (POA). Follow-up care reviewed. Patient and son (POA) verbalized understanding. Patient A&Ox4, VSS, and ambulatory with steady gait upon discharge.

## 2021-01-06 NOTE — Discharge Instructions (Addendum)
Please follow-up with your doctor tomorrow as normally scheduled.  The blood tests are overall reassuring.  We do not see signs of dehydration or significant anemia.  I did not see signs of a heart attack either.  Continue with her current medications.  We talked about her urinalysis today, which showed 1 possible sign of an infection.  We decided to wait on starting antibiotics until we have the results of her urine culture.  Jasmen has had several contaminated urine samples in the past.  The urine culture may take 2-3 days to result.  If there is a positive for significant result, he should be contacted by someone from the hospital to start antibiotics.  Otherwise you can follow-up with her primary care doctor on these results.

## 2021-01-06 NOTE — ED Triage Notes (Signed)
Pt arrived to EMS from home w/ c/o TIA-like s/s. Pt's son at bedside providing most of pt history and s/s witnessed this morning. Pt LKW 0759, onset of s/s 0800. Pt's son reports pt was acting normal and doing her morning routine. He gave her a glass of water and instructed pt to take a couple sips and pt's son reported he saw her staring at the glass of water. Pt's son reports he couldn't get pt to respond to him and was nonverbal. Pt has hx of dementia at baseline and had similar episode in July 28th and pt was worked up for TIA. 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 Pt oriented to self and place only at baseline and is such here. VSS w/ EMS.

## 2021-01-06 NOTE — ED Provider Notes (Signed)
[facility] EMERGENCY DEPARTMENT Provider Note   CSN: [id number] Arrival date & time: 01/06/21  0902     History CC: Staring spell   Brenda Lynch is a 87 y.o. female with hx of dementia presenting to the ED with a starring spell.  Her son provides history at bedside.  He reports around 0800 the patient was sitting on the recliner and stopped speaking to him.  He states, "She was blinking and not responding."  He repeatedly called her name, asked her to follow commands, but she would not comply.  No clonic activity.  EMS called to scene and brought patient to ED.  On her arrival she is back to her baseline mental status per her son's bedside report.  Patient denies HA, chest pain, chest pressure, numbness or weakness.  She has no complaints.  Her son reports she's had similar symptoms in the past and they were told they may be "TIA's."  She is on Plavix 75 mg daily, no other blood thinners.  The patient does not tolerate MRI even with sedation.  HPI     Past Medical History:  Diagnosis Date   Allergic rhinitis    Chronic diastolic CHF (congestive heart failure) ([place])    CKD (chronic kidney disease), stage II    Coronary artery disease    Diabetes mellitus    Diastolic dysfunction    Diverticulosis    Drug-induced bradycardia 01/28/2016   Hypercholesterolemia    Hypertension    Hyponatremia    Meningioma (HCC)    Osteoarthritis    Sedentary lifestyle    Spinal stenosis    Vertigo, benign positional     Patient Active Problem List   Diagnosis Date Noted   Hyponatremia 07/31/2020   TIA (transient ischemic attack) 06/09/2020   Type 2 diabetes mellitus with hyperlipidemia ([place]) 06/09/2020   Dementia without behavioral disturbance ([place]) 06/09/2020   Presbycusis of both ears 09/14/2016   Drug-induced bradycardia 01/28/2016   Cephalalgia 09/16/2015   Orthostatic dizziness 09/16/2015   Post-nasal drainage 09/16/2015   SOB (shortness of breath)  07/29/2013   Hypertension    Diastolic dysfunction    Hypercholesterolemia    Diabetes mellitus    Chronic diastolic CHF (congestive heart failure) ([place])     Past Surgical History:  Procedure Laterality Date   BACK SURGERY     BREAST BIOPSY     SKIN FULL THICKNESS GRAFT Right 11/08/2017   Procedure: RIGHT SMALL FINGER ABLATION OF NAIL ANDSKIN GRAFT FULL THICKNESS;  Surgeon: Leanora Cover, MD;  Location: [facility];  Service: Orthopedics;  Laterality: Right;   TONSILLECTOMY       OB History   No obstetric history on file.     Family History  Problem Relation Age of Onset   Heart disease Mother    Heart attack Mother    Prostate cancer Father    Lung cancer Brother    Pancreatic cancer Brother    Headache Neg Hx     Social History   Tobacco Use   Smoking status: Former    Types: Cigarettes    Quit date: 01/30/1979    Years since quitting: 41.9   Smokeless tobacco: Never  Vaping Use   Vaping Use: Never used  Substance Use Topics   Alcohol use: No   Drug use: No    Home Medications Prior to Admission medications   Medication Sig Start Date End Date Taking? Authorizing Provider  acetaminophen (TYLENOL) 500 MG tablet Take  500 mg by mouth every 6 (six) hours as needed (for headaches or mild pain).   Yes [provider]  calcium carbonate (TUMS - DOSED IN MG ELEMENTAL CALCIUM) 500 MG chewable tablet Chew 1 tablet by mouth daily as needed for indigestion or heartburn.   Yes [provider]  Cholecalciferol (VITAMIN D3) 25 MCG (1000 UT) CAPS Take 1,000 Units by mouth in the morning.   Yes [provider]  clopidogrel (PLAVIX) 75 MG tablet Take 1 tablet (75 mg total) by mouth daily. 06/12/20  Yes Hosie Poisson, MD  estradiol (ESTRACE) 0.1 MG/GM vaginal cream Place 1 Applicatorful vaginally See admin instructions. Place 1 applicatorful vaginally 2 times a week as needed for dryness   Yes [provider]  fluticasone (FLONASE)  50 MCG/ACT nasal spray Place 1-2 sprays into both nostrils daily as needed for allergies or rhinitis.   Yes [provider]  hydrochlorothiazide (MICROZIDE) 12.5 MG capsule TAKE (1) CAPSULE DAILY. Patient taking differently: Take 12.5 mg by mouth daily. 05/25/20  Yes Turner, Eber Hong, MD  hydrocortisone (ANUSOL-HC) 25 MG suppository Place 25 mg rectally 2 (two) times daily as needed for hemorrhoids or itching.    Yes [provider]  loperamide (IMODIUM A-D) 2 MG tablet Take 2 mg by mouth daily as needed for diarrhea or loose stools.   Yes [provider]  Melatonin 10 MG TABS Take 10 mg by mouth at bedtime as needed (for sleep).   Yes [provider]  Misc Natural Products (OSTEO BI-FLEX ADV DOUBLE ST PO) Take 1 tablet by mouth daily.   Yes [provider]  montelukast (SINGULAIR) 10 MG tablet Take 10 mg by mouth at bedtime. 12/12/12  Yes [provider]  ondansetron (ZOFRAN) 4 MG tablet Take 4 mg by mouth daily as needed for nausea or vomiting. 09/27/20  Yes [provider]  Polyethyl Glyc-Propyl Glyc PF (SYSTANE HYDRATION PF) 0.4-0.3 % SOLN Place 1 drop into both eyes 3 (three) times daily as needed (for irritation).   Yes [provider]  polyethylene glycol powder (GLYCOLAX/MIRALAX) 17 GM/SCOOP powder Take 8.5-17 g by mouth daily as needed for mild constipation (MIX AND DRINK).   Yes [provider]  potassium chloride (MICRO-K) 10 MEQ CR capsule Take 10 mEq by mouth in the morning.  07/17/14  Yes [provider]  PRILOSEC OTC 20 MG tablet Take 20 mg by mouth daily before breakfast.   Yes [provider]  ramipril (ALTACE) 10 MG capsule Take 10 mg by mouth in the morning.  10/06/19  Yes [provider]  Saline (SIMPLY SALINE) 0.9 % AERS Place 1 spray into both nostrils as needed (for congestion).   Yes [provider]  traZODone (DESYREL) 50 MG tablet Take 25 mg by mouth at bedtime as  needed for sleep. 10/09/20  Yes [provider]  aspirin 325 MG tablet Take 1 tablet (325 mg total) by mouth daily. Patient not taking: Reported on 01/06/2021 06/12/20   Hosie Poisson, MD  atorvastatin (LIPITOR) 20 MG tablet Take 1 tablet (20 mg total) by mouth daily. Patient not taking: Reported on 01/06/2021 06/12/20   Hosie Poisson, MD  Lancets [facility] ULTRASOFT) lancets 1 each by Other route as directed.  05/01/14   [provider]  NONFORMULARY OR COMPOUNDED ITEM Place 1 application rectally See admin instructions. Diltiazem 2% ointment, compounded- APPLY TOPICALLY TO ANUS FOUR TIMES DAILY Patient not taking: Reported on 01/06/2021 06/24/20   [provider]  ONE TOUCH ULTRA TEST test strip 1 each by Other route as directed.  06/21/14   [provider]    Allergies    Ciprofloxacin, Doxycycline hyclate, Penicillins, and Penicillin g  Review of Systems   Review of Systems  Constitutional:  Negative for chills and fever.  Respiratory:  Negative for cough and shortness of breath.   Cardiovascular:  Negative for chest pain and palpitations.  Gastrointestinal:  Negative for abdominal pain and vomiting.  Musculoskeletal:  Negative for arthralgias and back pain.  Skin:  Negative for color change and rash.  Neurological:  Negative for syncope, light-headedness and headaches.  All other systems reviewed and are negative.  Physical Exam Updated Vital Signs BP (!) 165/76   Pulse 71   Temp 98.6 F (37 C) (Oral)   Resp 18   Ht 5\' 5"  (1.651 m)   Wt 56.7 kg   SpO2 98%   BMI 20.80 kg/m   Physical Exam Constitutional:      General: She is not in acute distress. HENT:     Head: Normocephalic and atraumatic.  Eyes:     Conjunctiva/sclera: Conjunctivae normal.     Pupils: Pupils are equal, round, and reactive to light.  Cardiovascular:     Rate and Rhythm: Normal rate and regular rhythm.  Pulmonary:     Effort: Pulmonary effort is normal. No respiratory  distress.  Abdominal:     General: There is no distension.     Tenderness: There is no abdominal tenderness.  Skin:    General: Skin is warm and dry.  Neurological:     General: No focal deficit present.     Mental Status: She is alert. Mental status is at baseline.     Sensory: No sensory deficit.     Motor: No weakness.     Comments: AAO x 2 (baseline per son's report)    ED Results / Procedures / Treatments   Labs (all labs ordered are listed, but only abnormal results are displayed) Labs Reviewed  BASIC METABOLIC PANEL - Abnormal; Notable for the following components:      Result Value   Glucose, Bld 104 (*)    Creatinine, Ser 1.04 (*)    GFR, Estimated 49 (*)    All other components within normal limits  CBC WITH DIFFERENTIAL/PLATELET - Abnormal; Notable for the following components:   RBC 3.63 (*)    Hemoglobin 10.8 (*)    HCT 33.9 (*)    RDW 15.9 (*)    All other components within normal limits  URINALYSIS, ROUTINE W REFLEX MICROSCOPIC - Abnormal; Notable for the following components:   APPearance HAZY (*)    Hgb urine dipstick MODERATE (*)    Leukocytes,Ua MODERATE (*)    Bacteria, UA FEW (*)    All other components within normal limits  RESP PANEL BY RT-PCR (FLU A&B, COVID) ARPGX2  URINE CULTURE  TROPONIN I (HIGH SENSITIVITY)    EKG EKG Interpretation  Date/Time:  Thursday January 06 2021 09:21:08 EDT Ventricular Rate:  56 PR Interval:  206 QRS Duration: 100 QT Interval:  461 QTC Calculation: 445 R Axis:   47 Text Interpretation: Sinus rhythm T wave inversions in lateral inferior leads noted on prior tracings Jul 31 2020 No STEMI Confirmed by Octaviano Glow [phone]) on 01/06/2021 9:32:14 AM  Radiology No results found.  Procedures Procedures   Medications Ordered in ED Medications - No data to display  ED Course  I have reviewed the triage vital signs  and the nursing notes.  Pertinent labs & imaging results that were available during my care  of the patient were reviewed by me and considered in my medical decision making (see chart for details).  Patient is here with a  starring spell, now resolved.  No loss of consciousness or syncope.  History is not consistent with a seizure.  It is possible this may have been a TIA or recrudescence of prior stroke, versus an infection or arrhythmia.  Her EKG on arrival my interpretation shows sinus rhythm with no significant changes from her prior tracings.  She has no chest pain.  Discussed with her son the utility of pursuing further TIA work-up at this point given that she is already on Plavix.  She does not tolerate MRI scans.  I do not see any evidence of a large vessel occlusion or active stroke symptoms.  We have agreed to forego neuroimaging at this time unless her clinical presentation changes here in the ED.  We check for UTI, will check her electrolytes and hemoglobin level as well.  We can do a COVID and flu swab.  She has no obvious infectious symptoms or history and is afebrile on arrival.  However given her age we do need to cast a wider net for atypical infection symptoms.  Supplemental history is provided by the patient's aunt at bedside.  I personally reviewed her prior medical records.  Patient had 2 MRIs of the brain in March 2022 for similar symptoms, which showed no acute infarct.  These were limited by some motion artifact.  The patient has an unchanged 13 mm meningioma in the brain.   Patient subsequently had another episode in May 2022, was briefly hospitalized for TIA and stroke work-up.  She was having staring spells at that time.  It was recommended that she stop all sedative medications.  She had an EEG performed that reportedly showed no epileptiform discharges.    Clinical Course as of 01/06/21 1359  Thu Jan 06, 2021  1017 Troponin I (High Sensitivity) [RA]  1135 Chalmers Guest): MODERATE [MT]  1149 Discussed the patient's urine results with her son.  I explained that  in the past several months she has had 3 urine cultures which were contaminated appear to be no significant growth today.  She has no other active symptoms of UTI.  He would prefer to wait for the urine culture results prior to initiating more antibiotics, she has sensitive antibiotics.  I think this is a completely reasonable plan.  They will follow-up with her PCP tomorrow, and we will also keep an eye out for culture. [MT]    Clinical Course User Index [MT] Trayce Caravello, Carola Rhine, MD [RA] Elizbeth Squires    Final Clinical Impression(s) / ED Diagnoses Final diagnoses:  Staring episodes    Rx / DC Orders ED Discharge Orders     None        Wyvonnia Dusky, MD 01/06/21 1359

## 2021-01-07 LAB — URINE CULTURE

## 2021-01-17 DIAGNOSIS — I700 Atherosclerosis of aorta: Secondary | ICD-10-CM | POA: Diagnosis not present

## 2021-01-17 DIAGNOSIS — Z23 Encounter for immunization: Secondary | ICD-10-CM | POA: Diagnosis not present

## 2021-01-17 DIAGNOSIS — D329 Benign neoplasm of meninges, unspecified: Secondary | ICD-10-CM | POA: Diagnosis not present

## 2021-01-17 DIAGNOSIS — K219 Gastro-esophageal reflux disease without esophagitis: Secondary | ICD-10-CM | POA: Diagnosis not present

## 2021-01-17 DIAGNOSIS — I10 Essential (primary) hypertension: Secondary | ICD-10-CM | POA: Diagnosis not present

## 2021-01-17 DIAGNOSIS — M1990 Unspecified osteoarthritis, unspecified site: Secondary | ICD-10-CM | POA: Diagnosis not present

## 2021-01-17 DIAGNOSIS — H8110 Benign paroxysmal vertigo, unspecified ear: Secondary | ICD-10-CM | POA: Diagnosis not present

## 2021-01-17 DIAGNOSIS — Z1389 Encounter for screening for other disorder: Secondary | ICD-10-CM | POA: Diagnosis not present

## 2021-01-17 DIAGNOSIS — F0390 Unspecified dementia without behavioral disturbance: Secondary | ICD-10-CM | POA: Diagnosis not present

## 2021-01-17 DIAGNOSIS — Z0000 Encounter for general adult medical examination without abnormal findings: Secondary | ICD-10-CM | POA: Diagnosis not present

## 2021-01-17 DIAGNOSIS — E1122 Type 2 diabetes mellitus with diabetic chronic kidney disease: Secondary | ICD-10-CM | POA: Diagnosis not present

## 2021-01-17 DIAGNOSIS — E7800 Pure hypercholesterolemia, unspecified: Secondary | ICD-10-CM | POA: Diagnosis not present

## 2021-01-18 DIAGNOSIS — E1122 Type 2 diabetes mellitus with diabetic chronic kidney disease: Secondary | ICD-10-CM | POA: Diagnosis not present

## 2021-02-02 DIAGNOSIS — R319 Hematuria, unspecified: Secondary | ICD-10-CM | POA: Diagnosis not present

## 2021-02-02 DIAGNOSIS — R454 Irritability and anger: Secondary | ICD-10-CM | POA: Diagnosis not present

## 2021-02-15 ENCOUNTER — Ambulatory Visit: Payer: Medicare PPO | Admitting: Podiatry

## 2021-02-16 ENCOUNTER — Ambulatory Visit: Payer: Medicare PPO | Admitting: Podiatry

## 2021-02-16 ENCOUNTER — Other Ambulatory Visit: Payer: Self-pay

## 2021-02-16 DIAGNOSIS — M79675 Pain in left toe(s): Secondary | ICD-10-CM | POA: Diagnosis not present

## 2021-02-16 DIAGNOSIS — M1990 Unspecified osteoarthritis, unspecified site: Secondary | ICD-10-CM | POA: Insufficient documentation

## 2021-02-16 DIAGNOSIS — M2041 Other hammer toe(s) (acquired), right foot: Secondary | ICD-10-CM

## 2021-02-16 DIAGNOSIS — L84 Corns and callosities: Secondary | ICD-10-CM | POA: Diagnosis not present

## 2021-02-16 DIAGNOSIS — R142 Eructation: Secondary | ICD-10-CM | POA: Insufficient documentation

## 2021-02-16 DIAGNOSIS — N182 Chronic kidney disease, stage 2 (mild): Secondary | ICD-10-CM | POA: Insufficient documentation

## 2021-02-16 DIAGNOSIS — K219 Gastro-esophageal reflux disease without esophagitis: Secondary | ICD-10-CM | POA: Insufficient documentation

## 2021-02-16 DIAGNOSIS — B3731 Acute candidiasis of vulva and vagina: Secondary | ICD-10-CM | POA: Insufficient documentation

## 2021-02-16 DIAGNOSIS — E785 Hyperlipidemia, unspecified: Secondary | ICD-10-CM

## 2021-02-16 DIAGNOSIS — E1169 Type 2 diabetes mellitus with other specified complication: Secondary | ICD-10-CM

## 2021-02-16 DIAGNOSIS — R141 Gas pain: Secondary | ICD-10-CM | POA: Insufficient documentation

## 2021-02-16 DIAGNOSIS — N9489 Other specified conditions associated with female genital organs and menstrual cycle: Secondary | ICD-10-CM | POA: Insufficient documentation

## 2021-02-16 DIAGNOSIS — M79674 Pain in right toe(s): Secondary | ICD-10-CM

## 2021-02-16 DIAGNOSIS — E119 Type 2 diabetes mellitus without complications: Secondary | ICD-10-CM

## 2021-02-16 DIAGNOSIS — K5900 Constipation, unspecified: Secondary | ICD-10-CM | POA: Insufficient documentation

## 2021-02-16 DIAGNOSIS — G44209 Tension-type headache, unspecified, not intractable: Secondary | ICD-10-CM | POA: Insufficient documentation

## 2021-02-16 DIAGNOSIS — N3943 Post-void dribbling: Secondary | ICD-10-CM | POA: Insufficient documentation

## 2021-02-16 DIAGNOSIS — M2042 Other hammer toe(s) (acquired), left foot: Secondary | ICD-10-CM

## 2021-02-16 DIAGNOSIS — M8580 Other specified disorders of bone density and structure, unspecified site: Secondary | ICD-10-CM | POA: Insufficient documentation

## 2021-02-16 DIAGNOSIS — L03031 Cellulitis of right toe: Secondary | ICD-10-CM

## 2021-02-16 DIAGNOSIS — M48061 Spinal stenosis, lumbar region without neurogenic claudication: Secondary | ICD-10-CM | POA: Insufficient documentation

## 2021-02-16 DIAGNOSIS — N390 Urinary tract infection, site not specified: Secondary | ICD-10-CM | POA: Insufficient documentation

## 2021-02-16 DIAGNOSIS — E1121 Type 2 diabetes mellitus with diabetic nephropathy: Secondary | ICD-10-CM | POA: Insufficient documentation

## 2021-02-16 DIAGNOSIS — E1142 Type 2 diabetes mellitus with diabetic polyneuropathy: Secondary | ICD-10-CM | POA: Insufficient documentation

## 2021-02-16 DIAGNOSIS — I700 Atherosclerosis of aorta: Secondary | ICD-10-CM | POA: Insufficient documentation

## 2021-02-16 DIAGNOSIS — E440 Moderate protein-calorie malnutrition: Secondary | ICD-10-CM | POA: Insufficient documentation

## 2021-02-16 DIAGNOSIS — H8110 Benign paroxysmal vertigo, unspecified ear: Secondary | ICD-10-CM | POA: Insufficient documentation

## 2021-02-16 DIAGNOSIS — R269 Unspecified abnormalities of gait and mobility: Secondary | ICD-10-CM | POA: Insufficient documentation

## 2021-02-16 DIAGNOSIS — J309 Allergic rhinitis, unspecified: Secondary | ICD-10-CM | POA: Insufficient documentation

## 2021-02-16 DIAGNOSIS — D509 Iron deficiency anemia, unspecified: Secondary | ICD-10-CM | POA: Insufficient documentation

## 2021-02-16 DIAGNOSIS — G8929 Other chronic pain: Secondary | ICD-10-CM | POA: Insufficient documentation

## 2021-02-16 DIAGNOSIS — B351 Tinea unguium: Secondary | ICD-10-CM

## 2021-02-16 DIAGNOSIS — M19049 Primary osteoarthritis, unspecified hand: Secondary | ICD-10-CM | POA: Insufficient documentation

## 2021-02-16 DIAGNOSIS — E46 Unspecified protein-calorie malnutrition: Secondary | ICD-10-CM | POA: Insufficient documentation

## 2021-02-16 DIAGNOSIS — G4700 Insomnia, unspecified: Secondary | ICD-10-CM | POA: Insufficient documentation

## 2021-02-16 DIAGNOSIS — D320 Benign neoplasm of cerebral meninges: Secondary | ICD-10-CM | POA: Insufficient documentation

## 2021-02-16 DIAGNOSIS — E739 Lactose intolerance, unspecified: Secondary | ICD-10-CM | POA: Insufficient documentation

## 2021-02-16 NOTE — Patient Instructions (Addendum)
    EPSOM SALT FOOT SOAK INSTRUCTIONS  Shopping List:  A. Plain epsom salt (not scented) B. Neosporin Cream C. 1-inch fabric band-aids   Place 1/4 cup of epsom salts in 2 quarts of warm tap water. IF YOU ARE DIABETIC, OR HAVE NEUROPATHY, CHECK THE TEMPERATURE OF THE WATER WITH YOUR ELBOW.   Submerge your foot/feet in the solution and soak for 10-15 minutes.      3.  Next, remove your foot/feet from solution, blot dry the affected area.    4.  Apply light amount of antibiotic cream/ointment and cover with fabric band-aid .  5.  This soak should be done once a day for 7 days. After one week of soaks, continue to apply Neosporin to right great toe once daily for an additional week.  6.  Monitor for any signs/symptoms of infection such as redness, swelling, odor, drainage, increased pain, or non-healing of digit.   7.  Please do not hesitate to call the office and speak to a Nurse or Doctor if you have questions.   8.  If you experience fever, chills, nightsweats, nausea or vomiting with worsening of digit/foot, please go to the emergency room.

## 2021-02-17 ENCOUNTER — Encounter: Payer: Self-pay | Admitting: Podiatry

## 2021-02-17 NOTE — Progress Notes (Signed)
ANNUAL DIABETIC FOOT EXAM  Subjective: Brenda Lynch presents today for for annual diabetic foot examination and callus(es) left heel and painful thick toenails that are difficult to trim. Painful toenails interfere with ambulation. Aggravating factors include wearing enclosed shoe gear. Pain is relieved with periodic professional debridement. Painful calluses are aggravated when weightbearing with and without shoegear. Pain is relieved with periodic professional debridement.  Patient relates 20 year h/o diabetes.  Patient denies any h/o foot wounds.  Patient denies any numbness, tingling, burning, or pins/needle sensation in feet.  Patient's blood sugar was 95 mg/dl this morning.   Brenda Low, MD is patient's PCP. Last visit was 02/02/2021.  She is accompanied by her son on today's visit. Son states Mom's caregiver noticed patient was having pain/sensitivity of right great toenail. Pain presents mostly when pressure is applied to the nailplate dorsally. They deny any redness, drainage or swelling. No fever, chills, night sweats, nausea or vomiting. Caregiver also noticed rough skin on outside of her heels as well as plantar aspect of left heel.  Past Medical History:  Diagnosis Date   Allergic rhinitis    Chronic diastolic CHF (congestive heart failure) ([place])    CKD (chronic kidney disease), stage II    Coronary artery disease    Diabetes mellitus    Diastolic dysfunction    Diverticulosis    Drug-induced bradycardia 01/28/2016   Hypercholesterolemia    Hypertension    Hyponatremia    Meningioma (HCC)    Osteoarthritis    Sedentary lifestyle    Spinal stenosis    Vertigo, benign positional    Patient Active Problem List   Diagnosis Date Noted   Osteopenia 02/16/2021   Allergic rhinitis 02/16/2021   Benign neoplasm of cerebral meninges ([place]) 02/16/2021   Benign paroxysmal positional vertigo 02/16/2021   Burning sensation of vulva 02/16/2021   Candidal vulvovaginitis  02/16/2021   Chronic kidney disease, stage 2 (mild) 02/16/2021   Chronic pain 02/16/2021   Constipation 02/16/2021   Diabetic renal disease ([place]) 02/16/2021   Dribbling of urine 02/16/2021   Flatulence, eructation and gas pain 02/16/2021   Gastroesophageal reflux disease 02/16/2021   Gait abnormality 02/16/2021   Iron deficiency anemia 02/16/2021   Insomnia 02/16/2021   Hardening of the aorta (main artery of the heart) ([place]) 02/16/2021   Localized, primary osteoarthritis of hand 02/16/2021   Lactose intolerance 02/16/2021   Lumbar spinal stenosis 02/16/2021   Malnutrition of moderate degree ([place]: 60% to less than 75% of standard weight) ([place]) 02/16/2021   Osteoarthritis 02/16/2021   Polyneuropathy due to type 2 diabetes mellitus ([place]) 02/16/2021   Protein calorie malnutrition ([place]) 02/16/2021   Tension-type headache 02/16/2021   Urinary tract infectious disease 02/16/2021   Hyponatremia 07/31/2020   TIA (transient ischemic attack) 06/09/2020   Type 2 diabetes mellitus with hyperlipidemia ([place]) 06/09/2020   Dementia without behavioral disturbance ([place]) 06/09/2020   Presbycusis of both ears 09/14/2016   Drug-induced bradycardia 01/28/2016   Cephalalgia 09/16/2015   Orthostatic dizziness 09/16/2015   Post-nasal drainage 09/16/2015   SOB (shortness of breath) 07/29/2013   Hypertension    Diastolic dysfunction    Hypercholesterolemia    Diabetes mellitus    Chronic diastolic CHF (congestive heart failure) ([place])    Past Surgical History:  Procedure Laterality Date   BACK SURGERY     BREAST BIOPSY     SKIN FULL THICKNESS GRAFT Right 11/08/2017   Procedure: RIGHT SMALL FINGER ABLATION OF NAIL ANDSKIN GRAFT FULL THICKNESS;  Surgeon: Fredna Dow,  Lennette Bihari, MD;  Location: [facility];  Service: Orthopedics;  Laterality: Right;   TONSILLECTOMY     Current Outpatient Medications on File Prior to Visit  Medication Sig Dispense Refill   cetirizine (ZYRTEC ALLERGY) 10 MG tablet 1  tablet     fluconazole (DIFLUCAN) 150 MG tablet 1 tablet     nitrofurantoin, macrocrystal-monohydrate, (MACROBID) 100 MG capsule 1 capsule with food     ondansetron (ZOFRAN) 4 MG tablet 1 tablet     acetaminophen (TYLENOL) 500 MG tablet Take 500 mg by mouth every 6 (six) hours as needed (for headaches or mild pain).     aspirin 325 MG tablet Take 1 tablet (325 mg total) by mouth daily. (Patient not taking: Reported on 01/06/2021) 21 tablet 0   atorvastatin (LIPITOR) 20 MG tablet Take 1 tablet (20 mg total) by mouth daily. (Patient not taking: Reported on 01/06/2021) 30 tablet 1   calcium carbonate (TUMS - DOSED IN MG ELEMENTAL CALCIUM) 500 MG chewable tablet Chew 1 tablet by mouth daily as needed for indigestion or heartburn.     Cholecalciferol (VITAMIN D3) 25 MCG (1000 UT) CAPS Take 1,000 Units by mouth in the morning.     clopidogrel (PLAVIX) 75 MG tablet Take 1 tablet (75 mg total) by mouth daily. 30 tablet 3   clopidogrel (PLAVIX) 75 MG tablet Take 1 tablet by mouth daily.     estradiol (ESTRACE) 0.1 MG/GM vaginal cream Place 1 Applicatorful vaginally See admin instructions. Place 1 applicatorful vaginally 2 times a week as needed for dryness     fluticasone (FLONASE) 50 MCG/ACT nasal spray Place 1-2 sprays into both nostrils daily as needed for allergies or rhinitis.     hydrochlorothiazide (MICROZIDE) 12.5 MG capsule TAKE (1) CAPSULE DAILY. (Patient taking differently: Take 12.5 mg by mouth daily.) 30 capsule 11   hydrocortisone (ANUSOL-HC) 25 MG suppository Place 25 mg rectally 2 (two) times daily as needed for hemorrhoids or itching.      hydrOXYzine (ATARAX) 25 MG tablet 1 tablet at bedtime as needed     Lancets (ONETOUCH ULTRASOFT) lancets 1 each by Other route as directed.      loperamide (IMODIUM A-D) 2 MG tablet 1 tablet as needed     Melatonin 10 MG TABS Take 10 mg by mouth at bedtime as needed (for sleep).     melatonin 3 MG TABS tablet 1 tablet at bedtime as needed     Misc  Natural Products (OSTEO BI-FLEX ADV DOUBLE ST PO) Take 1 tablet by mouth daily.     montelukast (SINGULAIR) 10 MG tablet Take 10 mg by mouth at bedtime.     NONFORMULARY OR COMPOUNDED ITEM Place 1 application rectally See admin instructions. Diltiazem 2% ointment, compounded- APPLY TOPICALLY TO ANUS FOUR TIMES DAILY (Patient not taking: Reported on 01/06/2021)     ondansetron (ZOFRAN) 4 MG tablet Take 4 mg by mouth daily as needed for nausea or vomiting.     ONE TOUCH ULTRA TEST test strip 1 each by Other route as directed.      Polyethyl Glyc-Propyl Glyc PF (SYSTANE HYDRATION PF) 0.4-0.3 % SOLN Place 1 drop into both eyes 3 (three) times daily as needed (for irritation).     polyethylene glycol powder (GLYCOLAX/MIRALAX) 17 GM/SCOOP powder Take 8.5-17 g by mouth daily as needed for mild constipation (MIX AND DRINK).     potassium chloride (MICRO-K) 10 MEQ CR capsule Take 1 capsule by mouth daily.     PRILOSEC OTC 20  MG tablet Take 20 mg by mouth daily before breakfast.     ramipril (ALTACE) 10 MG capsule 1 capsule     Saline (SIMPLY SALINE) 0.9 % AERS Place 1 spray into both nostrils as needed (for congestion).     traZODone (DESYREL) 50 MG tablet Take 25 mg by mouth at bedtime as needed for sleep.     No current facility-administered medications on file prior to visit.    Allergies  Allergen Reactions   Ciprofloxacin Nausea Only and Other (See Comments)    GI intolerance  Other reaction(s): GI intol   Doxycycline Hyclate Other (See Comments)    BS elev, Head pressure    Penicillins Hives and Other (See Comments)    Welts, also Has patient had a PCN reaction causing immediate rash, facial/tongue/throat swelling, SOB or lightheadedness with hypotension: Yes Has patient had a PCN reaction causing severe rash involving mucus membranes or skin necrosis: No Has patient had a PCN reaction that required hospitalization No Has patient had a PCN reaction occurring within the last 10 years: No If  all of the above answers are "NO", then may proceed with Cephalosporin use.   Penicillin G Hives and Other (See Comments)    Welts on the body, also    Social History   Occupational History   Occupation: retired  Tobacco Use   Smoking status: Former    Types: Cigarettes    Quit date: 01/30/1979    Years since quitting: 42.0   Smokeless tobacco: Never  Vaping Use   Vaping Use: Never used  Substance and Sexual Activity   Alcohol use: No   Drug use: No   Sexual activity: Not on file   Family History  Problem Relation Age of Onset   Heart disease Mother    Heart attack Mother    Prostate cancer Father    Lung cancer Brother    Pancreatic cancer Brother    Headache Neg Hx    Immunization History  Administered Date(s) Administered   PFIZER(Purple Top)SARS-COV-2 Vaccination 01/06/2020     Review of Systems: Negative except as noted in the HPI.   Objective: There were no vitals filed for this visit.  Lamira B Ackroyd is a pleasant 87 y.o. female in NAD. AAO X 3.  Vascular Examination: CFT immediate b/l LE. Palpable DP/PT pulses b/l LE. Digital hair sparse b/l. Skin temperature gradient WNL b/l. No pain with calf compression b/l. No edema noted b/l. No cyanosis or clubbing noted b/l LE.  Dermatological Examination: Pedal skin thin, shiny and atrophic b/l LE. No open wounds b/l LE. No interdigital macerations noted b/l LE. Toenails 1-5 b/l elongated, discolored, dystrophic, thickened, crumbly with subungual debris and tenderness to dorsal palpation. Right hallux with nail border hypertrophy noted proximal nailfold. There is dried serous drainage and fungal debris noted at nailfold. No purulence, no active drainage, no odor. Mild rough skin on plantarlateral aspect of both heels.   Musculoskeletal Examination: Muscle strength 5/5 to all lower extremity muscle groups bilaterally. Hammertoe deformity noted 2-5 b/l. Plantar fat pad atrophy of forefoot area b/l lower extremities.  Plantar fat pad atrophy bilateral heels.  Footwear Assessment: Does the patient wear appropriate shoes? Yes. Does the patient need inserts/orthotics? Yes.  Neurological Examination: Protective sensation intact 5/5 intact bilaterally with 10g monofilament b/l. Vibratory sensation intact b/l.  Hemoglobin A1C Latest Ref Rng & Units 08/01/2020 06/10/2020  HGBA1C 4.8 - 5.6 % 6.4(H) 6.1(H)  Some recent data might be hidden   Assessment:  1. Pain due to onychomycosis of toenails of both feet   2. Paronychia of toenail of right foot   3. Callus   4. Acquired hammertoes of both feet   5. Type 2 diabetes mellitus with hyperlipidemia ([place])   6. Encounter for diabetic foot exam ([place])      ADA Risk Categorization: High Risk  Patient has one or more of the following: Loss of protective sensation Absent pedal pulses Severe Foot deformity History of foot ulcer  Plan: -Revisited need for heel protectors for her to wear when in bed to prevent pressure on posterolateral aspect of both heels. Son states he will order from [place] -Due to thinning of skin and potential for decubitus ulcers, I am classifying her as high risk. -Diabetic foot examination performed today. -Continue foot and shoe inspections daily. Monitor blood glucose per PCP/Endocrinologist's recommendations. -Mycotic toenails 1-5 bilaterally were debrided in length and girth with sterile nail nippers and dremel without incident. -Offending nail border debrided and curretaged R hallux utilizing sterile nail nipper and currette. Border(s) cleansed with alcohol and triple antibiotic ointment applied. Dispensed written instructions for once daily epsom salt soaks for 7 days. After week of epsom salt soaks is completed, apply Neosporin to digit once daily for an additional week. Call office if condition does not improve. -Callus(es) plantar aspect of heel left foot pared utilizing sterile scalpel blade without complication or incident. Total  number debrided =1. -Patient/POA to call should there be question/concern in the interim.  Return in about 3 months (around 05/17/2021).  Marzetta Board, DPM

## 2021-02-23 ENCOUNTER — Ambulatory Visit: Payer: Medicare PPO | Admitting: Podiatry

## 2021-02-23 DIAGNOSIS — N390 Urinary tract infection, site not specified: Secondary | ICD-10-CM | POA: Diagnosis not present

## 2021-03-09 ENCOUNTER — Ambulatory Visit: Payer: Medicare PPO | Admitting: Adult Health

## 2021-03-10 ENCOUNTER — Ambulatory Visit: Payer: Medicare PPO | Admitting: Adult Health

## 2021-03-22 ENCOUNTER — Telehealth: Payer: Self-pay | Admitting: *Deleted

## 2021-03-22 NOTE — Telephone Encounter (Signed)
Patient's son is calling because his mother's feet are swelling, has been wearing compression socks and is elevating her feet. Please schedule for appointment for reevaluation.

## 2021-03-23 ENCOUNTER — Other Ambulatory Visit: Payer: Self-pay

## 2021-03-23 ENCOUNTER — Encounter: Payer: Self-pay | Admitting: Podiatry

## 2021-03-23 ENCOUNTER — Ambulatory Visit: Payer: Medicare PPO | Admitting: Podiatry

## 2021-03-23 DIAGNOSIS — E785 Hyperlipidemia, unspecified: Secondary | ICD-10-CM

## 2021-03-23 DIAGNOSIS — R600 Localized edema: Secondary | ICD-10-CM

## 2021-03-23 DIAGNOSIS — E1169 Type 2 diabetes mellitus with other specified complication: Secondary | ICD-10-CM

## 2021-03-23 NOTE — Progress Notes (Signed)
°  Subjective:  Patient ID: Brenda Lynch, female    DOB: [birth year],   MRN: [id number]  Chief Complaint  Patient presents with   Foot Pain    Pt son states that her left foot is swollen and right foot around ankle is swollen.     88 y.o. female presents for concern of swelling in bilateral feet and some discoloration of the right foot. Son here with her today and is concerned about shoe gear and swelling. Relates he has been using compression stocking to help with swelling but was concerned since she is diabetic and wanted her evaluated.  . Denies any other pedal complaints. Denies n/v/f/c.   Past Medical History:  Diagnosis Date   Allergic rhinitis    Chronic diastolic CHF (congestive heart failure) ([place])    CKD (chronic kidney disease), stage II    Coronary artery disease    Diabetes mellitus    Diastolic dysfunction    Diverticulosis    Drug-induced bradycardia 01/28/2016   Hypercholesterolemia    Hypertension    Hyponatremia    Meningioma (HCC)    Osteoarthritis    Sedentary lifestyle    Spinal stenosis    Vertigo, benign positional     Objective:  Physical Exam: Vascular: DP/PT pulses 1/4 bilateral. CFT <3 seconds. Normal hair growth on digits. Mild lower extremity edema noted bilateral.   Skin. No lacerations or abrasions bilateral feet. Nails 1-5 are thickened discolored  with subungual debris. Mild discoloration noted to right lateral foot of the skin.  Musculoskeletal: MMT 5/5 bilateral lower extremities in DF, PF, Inversion and Eversion. Deceased ROM in DF of ankle joint. No pain to palpation.  Neurological: Sensation intact to light touch.   Assessment:   1. Type 2 diabetes mellitus with hyperlipidemia ([place])   2. Localized edema      Plan:  Patient was evaluated and treated and all questions answered. -Discussed and educated patient on diabetic foot care, especially with regards to the vascular, neurological and musculoskeletal systems.  -Stressed the  importance of good glycemic control and the detriment of not  controlling glucose levels in relation to the foot. -Discussed supportive shoes at all times and checking feet regularly.  -Discussed use of compression stocking  -Discussed supportive shoes and shoes she has with her today appear adequate.  -Answered all patient questions -Has follow-up with Dr. Elisha Ponder in March.   Lorenda Peck, DPM

## 2021-04-19 DIAGNOSIS — I700 Atherosclerosis of aorta: Secondary | ICD-10-CM | POA: Diagnosis not present

## 2021-04-19 DIAGNOSIS — I10 Essential (primary) hypertension: Secondary | ICD-10-CM | POA: Diagnosis not present

## 2021-04-19 DIAGNOSIS — F0390 Unspecified dementia without behavioral disturbance: Secondary | ICD-10-CM | POA: Diagnosis not present

## 2021-04-19 DIAGNOSIS — E46 Unspecified protein-calorie malnutrition: Secondary | ICD-10-CM | POA: Diagnosis not present

## 2021-04-19 DIAGNOSIS — I5030 Unspecified diastolic (congestive) heart failure: Secondary | ICD-10-CM | POA: Diagnosis not present

## 2021-04-19 DIAGNOSIS — E1142 Type 2 diabetes mellitus with diabetic polyneuropathy: Secondary | ICD-10-CM | POA: Diagnosis not present

## 2021-04-19 DIAGNOSIS — E1122 Type 2 diabetes mellitus with diabetic chronic kidney disease: Secondary | ICD-10-CM | POA: Diagnosis not present

## 2021-04-19 DIAGNOSIS — E7800 Pure hypercholesterolemia, unspecified: Secondary | ICD-10-CM | POA: Diagnosis not present

## 2021-04-19 DIAGNOSIS — D329 Benign neoplasm of meninges, unspecified: Secondary | ICD-10-CM | POA: Diagnosis not present

## 2021-04-21 ENCOUNTER — Ambulatory Visit: Payer: Medicare PPO | Admitting: Podiatry

## 2021-05-03 ENCOUNTER — Ambulatory Visit: Payer: Medicare PPO | Admitting: Adult Health

## 2021-05-17 ENCOUNTER — Other Ambulatory Visit: Payer: Self-pay | Admitting: Cardiology

## 2021-05-23 ENCOUNTER — Encounter: Payer: Self-pay | Admitting: Podiatry

## 2021-05-23 ENCOUNTER — Other Ambulatory Visit: Payer: Self-pay

## 2021-05-23 ENCOUNTER — Ambulatory Visit: Payer: Medicare PPO | Admitting: Podiatry

## 2021-05-23 DIAGNOSIS — B351 Tinea unguium: Secondary | ICD-10-CM

## 2021-05-23 DIAGNOSIS — M79675 Pain in left toe(s): Secondary | ICD-10-CM

## 2021-05-23 DIAGNOSIS — E1169 Type 2 diabetes mellitus with other specified complication: Secondary | ICD-10-CM | POA: Diagnosis not present

## 2021-05-23 DIAGNOSIS — L84 Corns and callosities: Secondary | ICD-10-CM | POA: Diagnosis not present

## 2021-05-23 DIAGNOSIS — M79674 Pain in right toe(s): Secondary | ICD-10-CM | POA: Diagnosis not present

## 2021-05-23 DIAGNOSIS — E785 Hyperlipidemia, unspecified: Secondary | ICD-10-CM

## 2021-05-29 NOTE — Progress Notes (Signed)
?  Subjective:  ?Patient ID: Brenda Lynch, female    DOB: [birth year],  MRN: [id number] ? ?Lezlie B Beaufort presents to clinic today for preventative diabetic foot care and corn(s) L 4th toe and painful thick toenails that are difficult to trim. Painful toenails interfere with ambulation. Aggravating factors include wearing enclosed shoe gear. Pain is relieved with periodic professional debridement. Painful corns are aggravated when weightbearing when wearing enclosed shoe gear. Pain is relieved with periodic professional debridement. ? ?Patient states blood glucose was 121 mg/dl today.   ? ?New problem(s): None.  ? ?PCP is [name], MD , and last visit was April 19, 2021. ? ?Allergies  ?Allergen Reactions  ? Ciprofloxacin Nausea Only and Other (See Comments)  ?  GI intolerance ? ?Other reaction(s): GI intol  ? Doxycycline Hyclate Other (See Comments)  ?  BS elev, Head pressure ?  ? Penicillins Hives and Other (See Comments)  ?  Welts, also ?Has patient had a PCN reaction causing immediate rash, facial/tongue/throat swelling, SOB or lightheadedness with hypotension: Yes ?Has patient had a PCN reaction causing severe rash involving mucus membranes or skin necrosis: No ?Has patient had a PCN reaction that required hospitalization No ?Has patient had a PCN reaction occurring within the last 10 years: No ?If all of the above answers are "NO", then may proceed with Cephalosporin use.  ? Penicillin G Hives and Other (See Comments)  ?  Welts on the body, also ?  ? ? ?Review of Systems: Negative except as noted in the HPI. ? ?Objective: ?Physical Exam ? ?General: Patient is a pleasant 88 y.o. African American female, thin build, in NAD. AAO x 3.  ? ?Neurovascular Examination: ?Capillary refill time to digits immediate b/l. Palpable pedal pulses b/l LE. Pedal hair present. Lower extremity skin temperature gradient within normal limits. No edema noted b/l lower extremities.  ? ?Protective sensation intact 5/5 intact  bilaterally with 10g monofilament b/l. Vibratory sensation intact b/l. ? ?Dermatological:  ?Pedal skin is thin and atrophied b/l. There is moderate fat pad atrophy of the plantar aspect of both feet. Central hyperkeratotic lesion on the plantar aspect of her left heel.  Hyperkeratotic lesion(s) L 4th toe.  No erythema, no edema, no drainage, no fluctuance. No open wounds b/l lower extremities. No interdigital macerations b/l lower extremities. Toenails 1-5 b/l elongated, discolored, dystrophic, thickened, crumbly with subungual debris and tenderness to dorsal palpation. ? ?Musculoskeletal:  ?Normal muscle strength 5/5 to all lower extremity muscle groups bilaterally. No pain crepitus or joint limitation noted with ROM b/l lower extremities. Hammertoe deformity noted 2-5 b/l.  ? ?Hemoglobin A1C Latest Ref Rng & Units 08/01/2020 06/10/2020  ?HGBA1C 4.8 - 5.6 % 6.4(H) 6.1(H)  ?Some recent data might be hidden  ? ?Assessment/Plan: ?1. Pain due to onychomycosis of toenails of both feet   ?2. Corns and callosities   ?3. Type 2 diabetes mellitus with hyperlipidemia ([place])   ?  ?-Examined patient. ?-Toenails 1-5 b/l were debrided in length and girth with sterile nail nippers and dremel without iatrogenic bleeding.  ?-Corn(s) L 4th toe and callus(es) plantar heel pad of left foot were pared utilizing sterile scalpel blade without incident. Total number debrided =2. ?-Patient/POA to call should there be question/concern in the interim.  ? ?Return in about 3 months (around 08/23/2021). ? ?Marzetta Board, DPM  ?

## 2021-06-06 DIAGNOSIS — H52203 Unspecified astigmatism, bilateral: Secondary | ICD-10-CM | POA: Diagnosis not present

## 2021-06-06 DIAGNOSIS — E119 Type 2 diabetes mellitus without complications: Secondary | ICD-10-CM | POA: Diagnosis not present

## 2021-06-06 DIAGNOSIS — H524 Presbyopia: Secondary | ICD-10-CM | POA: Diagnosis not present

## 2021-06-06 DIAGNOSIS — Z7984 Long term (current) use of oral hypoglycemic drugs: Secondary | ICD-10-CM | POA: Diagnosis not present

## 2021-06-06 DIAGNOSIS — Z961 Presence of intraocular lens: Secondary | ICD-10-CM | POA: Diagnosis not present

## 2021-06-06 DIAGNOSIS — H5213 Myopia, bilateral: Secondary | ICD-10-CM | POA: Diagnosis not present

## 2021-06-15 DIAGNOSIS — N390 Urinary tract infection, site not specified: Secondary | ICD-10-CM | POA: Diagnosis not present

## 2021-07-27 DIAGNOSIS — E1122 Type 2 diabetes mellitus with diabetic chronic kidney disease: Secondary | ICD-10-CM | POA: Diagnosis not present

## 2021-07-27 DIAGNOSIS — F0390 Unspecified dementia without behavioral disturbance: Secondary | ICD-10-CM | POA: Diagnosis not present

## 2021-07-27 DIAGNOSIS — G4700 Insomnia, unspecified: Secondary | ICD-10-CM | POA: Diagnosis not present

## 2021-07-27 DIAGNOSIS — N1831 Chronic kidney disease, stage 3a: Secondary | ICD-10-CM | POA: Diagnosis not present

## 2021-07-27 DIAGNOSIS — I700 Atherosclerosis of aorta: Secondary | ICD-10-CM | POA: Diagnosis not present

## 2021-07-27 DIAGNOSIS — E46 Unspecified protein-calorie malnutrition: Secondary | ICD-10-CM | POA: Diagnosis not present

## 2021-07-27 DIAGNOSIS — D329 Benign neoplasm of meninges, unspecified: Secondary | ICD-10-CM | POA: Diagnosis not present

## 2021-07-27 DIAGNOSIS — I5030 Unspecified diastolic (congestive) heart failure: Secondary | ICD-10-CM | POA: Diagnosis not present

## 2021-07-27 DIAGNOSIS — I10 Essential (primary) hypertension: Secondary | ICD-10-CM | POA: Diagnosis not present

## 2021-08-02 ENCOUNTER — Encounter: Payer: Self-pay | Admitting: Cardiology

## 2021-08-02 ENCOUNTER — Ambulatory Visit: Payer: Medicare PPO | Admitting: Cardiology

## 2021-08-02 VITALS — BP 130/80 | HR 74 | Ht 65.0 in | Wt 113.0 lb

## 2021-08-02 DIAGNOSIS — I10 Essential (primary) hypertension: Secondary | ICD-10-CM | POA: Diagnosis not present

## 2021-08-02 DIAGNOSIS — E1169 Type 2 diabetes mellitus with other specified complication: Secondary | ICD-10-CM

## 2021-08-02 DIAGNOSIS — E7800 Pure hypercholesterolemia, unspecified: Secondary | ICD-10-CM | POA: Diagnosis not present

## 2021-08-02 DIAGNOSIS — E785 Hyperlipidemia, unspecified: Secondary | ICD-10-CM

## 2021-08-02 DIAGNOSIS — I5032 Chronic diastolic (congestive) heart failure: Secondary | ICD-10-CM | POA: Diagnosis not present

## 2021-08-02 NOTE — Addendum Note (Signed)
Addended by: Antonieta Iba on: 08/02/2021 03:00 PM ? ? Modules accepted: Orders ? ?

## 2021-08-02 NOTE — Patient Instructions (Signed)
Medication Instructions:  ?Your physician recommends that you continue on your current medications as directed. Please refer to the Current Medication list given to you today. ? ?*If you need a refill on your cardiac medications before your next appointment, please call your pharmacy* ? ? ?Lab Work: ?TODAY: BMET ?If you have labs (blood work) drawn today and your tests are completely normal, you will receive your results only by: ?MyChart Message (if you have MyChart) OR ?A paper copy in the mail ?If you have any lab test that is abnormal or we need to change your treatment, we will call you to review the results. ? ?Follow-Up: ?At [facility], you and your health needs are our priority.  As part of our continuing mission to provide you with exceptional heart care, we have created designated Provider Care Teams.  These Care Teams include your primary Cardiologist (physician) and Advanced Practice Providers (APPs -  Physician Assistants and Nurse Practitioners) who all work together to provide you with the care you need, when you need it. ? ?Your next appointment:   ?6 month(s) ? ?The format for your next appointment:   ?In Person ? ?Provider:   ?Fransico Him, MD   ? ? ?Important Information About Sugar ? ? ? ? ?  ?

## 2021-08-02 NOTE — Progress Notes (Signed)
?Cardiology Office Note:   ? ?Date:  08/02/2021  ? ?ID:  Brenda Lynch, DOB 01-Nov-1923, MRN [id number] ? ?PCP:  [name], MD  ?Cardiologist:  Fransico Him, MD   ? ?Referring MD: [name], MD  ? ?Chief Complaint  ?Patient presents with  ? Congestive Heart Failure  ? Hypertension  ? Hyperlipidemia  ? ? ?History of Present Illness:   ? ?Brenda Lynch is a 88 y.o. female with a hx of HTN, diastolic dysfunction with chronic diastolic CHF, chronic SOB secondary to chronic diastolic CHF/sedentary lifestyle and anxiety, chronically abnormal EKG with LVH and repolarization.  She is here today for followup and is doing well.  She denies any chest pain or pressure, SOB, DOE, PND, orthopnea, dizziness, palpitations (except if she gets really excited) or syncope. She has chronic LE edema that is controlled on compression hose. She is compliant with her meds and is tolerating meds with no SE.    ? ?Past Medical History:  ?Diagnosis Date  ? Allergic rhinitis   ? Chronic diastolic CHF (congestive heart failure) ([place])   ? CKD (chronic kidney disease), stage II   ? Coronary artery disease   ? Diabetes mellitus   ? Diastolic dysfunction   ? Diverticulosis   ? Drug-induced bradycardia 01/28/2016  ? Hypercholesterolemia   ? Hypertension   ? Hyponatremia   ? Meningioma ([place])   ? Osteoarthritis   ? Sedentary lifestyle   ? Spinal stenosis   ? Vertigo, benign positional   ? ? ?Past Surgical History:  ?Procedure Laterality Date  ? BACK SURGERY    ? BREAST BIOPSY    ? SKIN FULL THICKNESS GRAFT Right 11/08/2017  ? Procedure: RIGHT SMALL FINGER ABLATION OF NAIL ANDSKIN GRAFT FULL THICKNESS;  Surgeon: Leanora Cover, MD;  Location: [facility];  Service: Orthopedics;  Laterality: Right;  ? TONSILLECTOMY    ? ? ?Current Medications: ?Current Meds  ?Medication Sig  ? acetaminophen (TYLENOL) 500 MG tablet Take 500 mg by mouth every 6 (six) hours as needed (for headaches or mild pain).  ? aspirin 325 MG tablet Take 1 tablet  (325 mg total) by mouth daily.  ? atorvastatin (LIPITOR) 20 MG tablet Take 1 tablet (20 mg total) by mouth daily.  ? calcium carbonate (TUMS - DOSED IN MG ELEMENTAL CALCIUM) 500 MG chewable tablet Chew 1 tablet by mouth daily as needed for indigestion or heartburn.  ? cetirizine (ZYRTEC) 10 MG tablet 1 tablet  ? Cholecalciferol (VITAMIN D3) 25 MCG (1000 UT) CAPS Take 1,000 Units by mouth in the morning.  ? clopidogrel (PLAVIX) 75 MG tablet Take 1 tablet (75 mg total) by mouth daily.  ? estradiol (ESTRACE) 0.1 MG/GM vaginal cream Place 1 Applicatorful vaginally See admin instructions. Place 1 applicatorful vaginally 2 times a week as needed for dryness  ? fluconazole (DIFLUCAN) 150 MG tablet 1 tablet  ? fluticasone (FLONASE) 50 MCG/ACT nasal spray Place 1-2 sprays into both nostrils daily as needed for allergies or rhinitis.  ? hydrochlorothiazide (MICROZIDE) 12.5 MG capsule TAKE ONE CAPSULE ONCE DAILY  ? hydrocortisone (ANUSOL-HC) 25 MG suppository Place 25 mg rectally 2 (two) times daily as needed for hemorrhoids or itching.   ? hydrOXYzine (ATARAX) 25 MG tablet 1 tablet at bedtime as needed  ? Lancets (ONETOUCH ULTRASOFT) lancets 1 each by Other route as directed.   ? loperamide (IMODIUM A-D) 2 MG tablet 1 tablet as needed  ? Melatonin 10 MG TABS Take 10 mg by mouth at  bedtime as needed (for sleep).  ? Misc Natural Products (OSTEO BI-FLEX ADV DOUBLE ST PO) Take 1 tablet by mouth daily.  ? montelukast (SINGULAIR) 10 MG tablet Take 10 mg by mouth at bedtime.  ? nitrofurantoin, macrocrystal-monohydrate, (MACROBID) 100 MG capsule 1 capsule with food  ? NONFORMULARY OR COMPOUNDED ITEM Place 1 application. rectally See admin instructions. Diltiazem 2% ointment, compounded- APPLY TOPICALLY TO ANUS FOUR TIMES DAILY  ? ondansetron (ZOFRAN) 4 MG tablet Take 4 mg by mouth daily as needed for nausea or vomiting.  ? ondansetron (ZOFRAN) 4 MG tablet 1 tablet  ? ONE TOUCH ULTRA TEST test strip 1 each by Other route as directed.    ? Polyethyl Glyc-Propyl Glyc PF (SYSTANE HYDRATION PF) 0.4-0.3 % SOLN Place 1 drop into both eyes 3 (three) times daily as needed (for irritation).  ? polyethylene glycol powder (GLYCOLAX/MIRALAX) 17 GM/SCOOP powder Take 8.5-17 g by mouth daily as needed for mild constipation (MIX AND DRINK).  ? potassium chloride (MICRO-K) 10 MEQ CR capsule Take 1 capsule by mouth daily.  ? PRILOSEC OTC 20 MG tablet Take 20 mg by mouth daily before breakfast.  ? ramipril (ALTACE) 10 MG capsule 1 capsule  ? Saline (SIMPLY SALINE) 0.9 % AERS Place 1 spray into both nostrils as needed (for congestion).  ? traZODone (DESYREL) 50 MG tablet Take 25 mg by mouth at bedtime as needed for sleep.  ?  ? ?Allergies:   Ciprofloxacin, Doxycycline hyclate, Penicillins, and Penicillin g  ? ?Social History  ? ?Socioeconomic History  ? Marital status: Widowed  ?  Spouse name: Not on file  ? Number of children: Not on file  ? Years of education: Not on file  ? Highest education level: Not on file  ?Occupational History  ? Occupation: retired  ?Tobacco Use  ? Smoking status: Former  ?  Types: Cigarettes  ?  Quit date: 01/30/1979  ?  Years since quitting: 42.5  ? Smokeless tobacco: Never  ?Vaping Use  ? Vaping Use: Never used  ?Substance and Sexual Activity  ? Alcohol use: No  ? Drug use: No  ? Sexual activity: Not on file  ?Other Topics Concern  ? Not on file  ?Social History Narrative  ? Lives alone with sons close by  ? ?Social Determinants of Health  ? ?Financial Resource Strain: Not on file  ?Food Insecurity: Not on file  ?Transportation Needs: Not on file  ?Physical Activity: Not on file  ?Stress: Not on file  ?Social Connections: Not on file  ?  ? ?Family History: ?The patient's family history includes Heart attack in her mother; Heart disease in her mother; Lung cancer in her brother; Pancreatic cancer in her brother; Prostate cancer in her father. There is no history of Headache. ? ?ROS:   ?Please see the history of present illness.    ?ROS   ?All other systems reviewed and negative.  ? ?EKGs/Labs/Other Studies Reviewed:   ? ?The following studies were reviewed today: ?EKG, outside labs from PCP on [facility] ? ?EKG:  EKG is not ordered today  ?Recent Labs: ?01/06/2021: BUN 14; Creatinine, Ser 1.04; Hemoglobin 10.8; Platelets 272; Potassium 3.9; Sodium 135  ? ?Recent Lipid Panel ?   ?Component Value Date/Time  ? CHOL 118 08/01/2020 0359  ? CHOL 207 (H) 05/16/2019 1351  ? TRIG 46 08/01/2020 0359  ? HDL 44 08/01/2020 0359  ? HDL 60 05/16/2019 1351  ? CHOLHDL 2.7 08/01/2020 0359  ? VLDL 9 08/01/2020 0359  ?  [place] 65 08/01/2020 0359  ? LDLCALC 135 (H) 05/16/2019 1351  ? ? ?Physical Exam:   ? ?VS:  BP 130/80   Pulse 74   Ht '5\' 5"'$  (1.651 m)   Wt 113 lb (51.3 kg)   BMI 18.80 kg/m?    ? ?Wt Readings from Last 3 Encounters:  ?08/02/21 113 lb (51.3 kg)  ?01/06/21 125 lb (56.7 kg)  ?10/14/20 125 lb (56.7 kg)  ?  ?GEN: Well nourished, well developed in no acute distress ?HEENT: Normal ?NECK: No JVD; No carotid bruits ?LYMPHATICS: No lymphadenopathy ?CARDIAC:RRR, no murmurs, rubs, gallops ?RESPIRATORY:  Clear to auscultation without rales, wheezing or rhonchi  ?ABDOMEN: Soft, non-tender, non-distended ?MUSCULOSKELETAL:  No edema; No deformity  ?SKIN: Warm and dry ?NEUROLOGIC:  Alert and oriented x 3 ?PSYCHIATRIC:  Normal affect   ? ?ASSESSMENT:   ? ?1. Chronic diastolic CHF (congestive heart failure) ([place])   ?2. Primary hypertension   ?3. Hypercholesterolemia   ?4. Type 2 diabetes mellitus with hyperlipidemia ([place])   ? ?PLAN:   ? ?In order of problems listed above: ? ?1.  Chronic diastolic CHF ?-She appears euvolemic on exam today ?-Continue prescription drug management with HCTZ 12.5 mg daily with as needed refills ? ?2.  HTN ?-BP is adequately controlled on exam ?-Continue prescription drug management HCTZ 12.5 mg daily and ramipril 10 mg daily with as needed refills ?-Check bmet today ? ?3.  HLD ?-LDL goal < 100 ?-followed by PCP ?-Continue prescription drug  management with atorvastatin 20 mg daily with as needed refills ? ?4.  DM2 ?-followed by PCP ? ? ?Followup with me in 1 year ? ? ?Medication Adjustments/Labs and Tests Ordered: ?Current medicines are reviewed at

## 2021-08-03 LAB — BASIC METABOLIC PANEL
BUN/Creatinine Ratio: 22 (ref 12–28)
BUN: 25 mg/dL (ref 10–36)
CO2: 24 mmol/L (ref 20–29)
Calcium: 9.4 mg/dL (ref 8.7–10.3)
Chloride: 102 mmol/L (ref 96–106)
Creatinine, Ser: 1.15 mg/dL — ABNORMAL HIGH (ref 0.57–1.00)
Glucose: 131 mg/dL — ABNORMAL HIGH (ref 70–99)
Potassium: 4.4 mmol/L (ref 3.5–5.2)
Sodium: 140 mmol/L (ref 134–144)
eGFR: 44 mL/min/{1.73_m2} — ABNORMAL LOW (ref >59)

## 2021-08-24 ENCOUNTER — Ambulatory Visit: Payer: Medicare PPO | Admitting: Podiatry

## 2021-08-29 ENCOUNTER — Emergency Department (HOSPITAL_COMMUNITY): Payer: Medicare PPO

## 2021-08-29 ENCOUNTER — Encounter (HOSPITAL_COMMUNITY): Payer: Self-pay

## 2021-08-29 ENCOUNTER — Emergency Department (HOSPITAL_COMMUNITY)
Admission: EM | Admit: 2021-08-29 | Discharge: 2021-08-29 | Disposition: A | Discharge status: Home / Self Care | Payer: Medicare PPO | Attending: Emergency Medicine | Admitting: Emergency Medicine

## 2021-08-29 ENCOUNTER — Other Ambulatory Visit: Payer: Self-pay

## 2021-08-29 DIAGNOSIS — W19XXXA Unspecified fall, initial encounter: Secondary | ICD-10-CM

## 2021-08-29 DIAGNOSIS — G319 Degenerative disease of nervous system, unspecified: Secondary | ICD-10-CM | POA: Diagnosis not present

## 2021-08-29 DIAGNOSIS — Z7902 Long term (current) use of antithrombotics/antiplatelets: Secondary | ICD-10-CM | POA: Insufficient documentation

## 2021-08-29 DIAGNOSIS — I10 Essential (primary) hypertension: Secondary | ICD-10-CM | POA: Diagnosis not present

## 2021-08-29 DIAGNOSIS — S0990XA Unspecified injury of head, initial encounter: Secondary | ICD-10-CM | POA: Diagnosis not present

## 2021-08-29 DIAGNOSIS — W06XXXA Fall from bed, initial encounter: Secondary | ICD-10-CM | POA: Insufficient documentation

## 2021-08-29 DIAGNOSIS — Z7982 Long term (current) use of aspirin: Secondary | ICD-10-CM | POA: Insufficient documentation

## 2021-08-29 DIAGNOSIS — F0390 Unspecified dementia without behavioral disturbance: Secondary | ICD-10-CM | POA: Insufficient documentation

## 2021-08-29 DIAGNOSIS — M542 Cervicalgia: Secondary | ICD-10-CM | POA: Insufficient documentation

## 2021-08-29 DIAGNOSIS — D329 Benign neoplasm of meninges, unspecified: Secondary | ICD-10-CM | POA: Diagnosis not present

## 2021-08-29 DIAGNOSIS — Z043 Encounter for examination and observation following other accident: Secondary | ICD-10-CM | POA: Diagnosis not present

## 2021-08-29 DIAGNOSIS — I6782 Cerebral ischemia: Secondary | ICD-10-CM | POA: Diagnosis not present

## 2021-08-29 LAB — PROTIME-INR
INR: 1.1 (ref 0.8–1.2)
Prothrombin Time: 13.6 seconds (ref 11.4–15.2)

## 2021-08-29 LAB — URINALYSIS, ROUTINE W REFLEX MICROSCOPIC
Bilirubin Urine: NEGATIVE
Glucose, UA: NEGATIVE mg/dL
Ketones, ur: NEGATIVE mg/dL
Nitrite: NEGATIVE
Protein, ur: NEGATIVE mg/dL
Specific Gravity, Urine: 1.012 (ref 1.005–1.030)
pH: 6 (ref 5.0–8.0)

## 2021-08-29 LAB — CBC WITH DIFFERENTIAL/PLATELET
Abs Immature Granulocytes: 0.01 10*3/uL (ref 0.00–0.07)
Basophils Absolute: 0.1 10*3/uL (ref 0.0–0.1)
Basophils Relative: 1 %
Eosinophils Absolute: 0.1 10*3/uL (ref 0.0–0.5)
Eosinophils Relative: 2 %
HCT: 37.8 % (ref 36.0–46.0)
Hemoglobin: 12.3 g/dL (ref 12.0–15.0)
Immature Granulocytes: 0 %
Lymphocytes Relative: 34 %
Lymphs Abs: 2.5 10*3/uL (ref 0.7–4.0)
MCH: 29.4 pg (ref 26.0–34.0)
MCHC: 32.5 g/dL (ref 30.0–36.0)
MCV: 90.4 fL (ref 80.0–100.0)
Monocytes Absolute: 0.5 10*3/uL (ref 0.1–1.0)
Monocytes Relative: 6 %
Neutro Abs: 4.1 10*3/uL (ref 1.7–7.7)
Neutrophils Relative %: 57 %
Platelets: 272 10*3/uL (ref 150–400)
RBC: 4.18 MIL/uL (ref 3.87–5.11)
RDW: 15.5 % (ref 11.5–15.5)
WBC: 7.1 10*3/uL (ref 4.0–10.5)
nRBC: 0 % (ref 0.0–0.2)

## 2021-08-29 LAB — COMPREHENSIVE METABOLIC PANEL
ALT: 14 U/L (ref 0–44)
AST: 19 U/L (ref 15–41)
Albumin: 3.4 g/dL — ABNORMAL LOW (ref 3.5–5.0)
Alkaline Phosphatase: 78 U/L (ref 38–126)
Anion gap: 10 (ref 5–15)
BUN: 19 mg/dL (ref 8–23)
CO2: 27 mmol/L (ref 22–32)
Calcium: 9.4 mg/dL (ref 8.9–10.3)
Chloride: 103 mmol/L (ref 98–111)
Creatinine, Ser: 1.13 mg/dL — ABNORMAL HIGH (ref 0.44–1.00)
GFR, Estimated: 45 mL/min — ABNORMAL LOW (ref >60)
Glucose, Bld: 119 mg/dL — ABNORMAL HIGH (ref 70–99)
Potassium: 3.6 mmol/L (ref 3.5–5.1)
Sodium: 140 mmol/L (ref 135–145)
Total Bilirubin: 0.8 mg/dL (ref 0.3–1.2)
Total Protein: 6.9 g/dL (ref 6.5–8.1)

## 2021-08-29 LAB — APTT: aPTT: 31 seconds (ref 24–36)

## 2021-08-29 NOTE — Discharge Instructions (Addendum)
You have been seen and discharged from the emergency department.  Your CT imaging was normal.  Your blood work was normal for you, no findings of UTI.  Follow-up with your primary provider for further evaluation and further care. Take home medications as prescribed. If you have any worsening symptoms or further concerns for your health please return to an emergency department for further evaluation.

## 2021-08-29 NOTE — ED Provider Notes (Signed)
[facility] EMERGENCY DEPARTMENT Provider Note   CSN: [id number] Arrival date & time: 08/29/21  0554     History  Chief Complaint  Patient presents with   Head Injury    Brenda Lynch is a 88 y.o. female.  The history is provided by the patient, medical records and a caregiver.  Head Injury Brenda Lynch is a 88 y.o. female who presents to the Emergency Department complaining of fall.  She presents to the ED accompanied by a caregiver for evaluation of injuries following a fall that occurred just prior to ED arrival.  She was getting out of bed and tripped, falling and striking her head on the nightstand.  No LOC.  She takes plavix.  Has a hx/o dementia, DM.  Pt complains of neck pain - ongoing issue for "a while."  Caregiver reports occasional vomiting.      Home Medications Prior to Admission medications   Medication Sig Start Date End Date Taking? Authorizing Provider  acetaminophen (TYLENOL) 500 MG tablet Take 500 mg by mouth every 6 (six) hours as needed (for headaches or mild pain).    [provider]  aspirin 325 MG tablet Take 1 tablet (325 mg total) by mouth daily. 06/12/20   Hosie Poisson, MD  atorvastatin (LIPITOR) 20 MG tablet Take 1 tablet (20 mg total) by mouth daily. 06/12/20   Hosie Poisson, MD  calcium carbonate (TUMS - DOSED IN MG ELEMENTAL CALCIUM) 500 MG chewable tablet Chew 1 tablet by mouth daily as needed for indigestion or heartburn.    [provider]  cetirizine (ZYRTEC) 10 MG tablet 1 tablet 05/31/11   [provider]  Cholecalciferol (VITAMIN D3) 25 MCG (1000 UT) CAPS Take 1,000 Units by mouth in the morning.    [provider]  clopidogrel (PLAVIX) 75 MG tablet Take 1 tablet (75 mg total) by mouth daily. 06/12/20   Hosie Poisson, MD  estradiol (ESTRACE) 0.1 MG/GM vaginal cream Place 1 Applicatorful vaginally See admin instructions. Place 1 applicatorful vaginally 2 times a week as needed for dryness     [provider]  fluconazole (DIFLUCAN) 150 MG tablet 1 tablet 02/12/21   [provider]  fluticasone (FLONASE) 50 MCG/ACT nasal spray Place 1-2 sprays into both nostrils daily as needed for allergies or rhinitis.    [provider]  hydrochlorothiazide (MICROZIDE) 12.5 MG capsule TAKE ONE CAPSULE ONCE DAILY 05/17/21   Sueanne Margarita, MD  hydrocortisone (ANUSOL-HC) 25 MG suppository Place 25 mg rectally 2 (two) times daily as needed for hemorrhoids or itching.     [provider]  hydrOXYzine (ATARAX) 25 MG tablet 1 tablet at bedtime as needed    [provider]  Lancets (ONETOUCH ULTRASOFT) lancets 1 each by Other route as directed.  05/01/14   [provider]  loperamide (IMODIUM A-D) 2 MG tablet 1 tablet as needed    [provider]  Melatonin 10 MG TABS Take 10 mg by mouth at bedtime as needed (for sleep).    [provider]  Misc Natural Products (OSTEO BI-FLEX ADV DOUBLE ST PO) Take 1 tablet by mouth daily.    [provider]  montelukast (SINGULAIR) 10 MG tablet Take 10 mg by mouth at bedtime. 12/12/12   [provider]  nitrofurantoin, macrocrystal-monohydrate, (MACROBID) 100 MG capsule 1 capsule with food 02/12/21   [provider]  NONFORMULARY OR COMPOUNDED ITEM Place 1 application. rectally See admin instructions. Diltiazem 2% ointment, compounded- APPLY  TOPICALLY TO ANUS FOUR TIMES DAILY 06/24/20   [provider]  ondansetron (ZOFRAN) 4 MG tablet Take 4 mg by mouth daily as needed for nausea or vomiting. 09/27/20   [provider]  ondansetron (ZOFRAN) 4 MG tablet 1 tablet 09/27/20   [provider]  ONE TOUCH ULTRA TEST test strip 1 each by Other route as directed.  06/21/14   [provider]  Polyethyl Glyc-Propyl Glyc PF (SYSTANE HYDRATION PF) 0.4-0.3 % SOLN Place 1 drop into both eyes 3 (three) times daily as needed (for irritation).    [provider]  polyethylene glycol powder (GLYCOLAX/MIRALAX) 17 GM/SCOOP powder Take 8.5-17 g by mouth daily as needed for mild constipation (MIX AND DRINK).    [provider]  potassium chloride (MICRO-K) 10 MEQ CR capsule Take 1 capsule by mouth daily.    [provider]  PRILOSEC OTC 20 MG tablet Take 20 mg by mouth daily before breakfast.    [provider]  ramipril (ALTACE) 10 MG capsule 1 capsule    [provider]  Saline (SIMPLY SALINE) 0.9 % AERS Place 1 spray into both nostrils as needed (for congestion).    [provider]  traZODone (DESYREL) 50 MG tablet Take 25 mg by mouth at bedtime as needed for sleep. 10/09/20   [provider]      Allergies    Ciprofloxacin, Doxycycline hyclate, Penicillins, and Penicillin g    Review of Systems   Review of Systems  All other systems reviewed and are negative.   Physical Exam Updated Vital Signs BP (!) 161/74   Pulse (!) 58   Temp 98 F (36.7 C) (Oral)   Resp 13   Ht '5\' 5"'$  (1.651 m)   Wt 52.2 kg   SpO2 98%   BMI 19.14 kg/m  Physical Exam Vitals and nursing note reviewed.  Constitutional:      Appearance: She is well-developed.  HENT:     Head: Normocephalic.     Comments: Ecchymosis and swelling to right forehead Cardiovascular:     Rate and Rhythm: Normal rate and regular rhythm.     Heart sounds: No murmur heard. Pulmonary:     Effort: Pulmonary effort is normal. No respiratory distress.     Breath sounds: Normal breath sounds.  Abdominal:     Palpations: Abdomen is soft.     Tenderness: There is no abdominal tenderness. There is no guarding or rebound.  Musculoskeletal:        General: No tenderness.     Comments: No hip tenderness to palpation.    Skin:    General: Skin is warm and dry.  Neurological:     Mental Status: She is alert.     Comments: Oriented to person, place.  Disoriented to time and recent events.  4+/5 strength in all four extremities.   Hard of hearing.    Psychiatric:        Behavior: Behavior normal.     ED Results / Procedures / Treatments   Labs (all labs ordered are listed, but only abnormal results are displayed) Labs Reviewed  CBC WITH DIFFERENTIAL/PLATELET  COMPREHENSIVE METABOLIC PANEL  PROTIME-INR  APTT  URINALYSIS, ROUTINE W REFLEX MICROSCOPIC    EKG EKG Interpretation  Date/Time:  Monday August 29 2021 06:21:17 EDT Ventricular Rate:  62 PR Interval:  221 QRS Duration: 114 QT Interval:  422 QTC Calculation: 429 R Axis:   42 Text Interpretation: Sinus rhythm Prolonged PR interval Probable  left atrial enlargement Incomplete right bundle branch block Anteroseptal infarct, old Abnormal T, consider ischemia, lateral leads TWI present previously on 01/06/21 Confirmed by Quintella Reichert [phone]) on 08/29/2021 6:35:54 AM  Radiology CT Cervical Spine Wo Contrast  Result Date: 08/29/2021 CLINICAL DATA:  Level 2 fall on blood thinners EXAM: CT CERVICAL SPINE WITHOUT CONTRAST TECHNIQUE: Multidetector CT imaging of the cervical spine was performed without intravenous contrast. Multiplanar CT image reconstructions were also generated. RADIATION DOSE REDUCTION: This exam was performed according to the departmental dose-optimization program which includes automated exposure control, adjustment of the mA and/or kV according to patient size and/or use of iterative reconstruction technique. COMPARISON:  07/08/2016 FINDINGS: Alignment: No traumatic malalignment suspected. C3-4 and C7-T1 anterolisthesis. Skull base and vertebrae: Generalized osteopenia with superimposed patchy degenerative endplate sclerosis. No acute fracture or incidental bone lesion. Soft tissues and spinal canal: No prevertebral fluid or swelling. No visible canal hematoma. Disc levels: Generalized degenerative disc space narrowing, degenerative disc collapse that is advanced at C4-5 to C6-7. Degenerative facet spurring asymmetric to the left. Very severe  facet osteoarthritis on the left at C1-2 with implied left C2 impingement Upper chest: Negative IMPRESSION: 1. No acute finding. 2. Advanced cervical spine degeneration as described. Electronically Signed   By: Jorje Guild M.D.   On: 08/29/2021 06:59   DG Chest Port 1 View  Result Date: 08/29/2021 CLINICAL DATA:  Fall EXAM: PORTABLE CHEST 1 VIEW COMPARISON:  07/31/2020 FINDINGS: Artifact from EKG leads. Normal heart size and mediastinal contours. Atheromatous calcification. No acute infiltrate or edema. No effusion or pneumothorax. No acute osseous findings. IMPRESSION: No evidence of active disease. Electronically Signed   By: Jorje Guild M.D.   On: 08/29/2021 06:39   CT HEAD WO CONTRAST (5MM)  Result Date: 08/29/2021 CLINICAL DATA:  88 year old female with history of head trauma from a fall. Patient is on Plavix. EXAM: CT HEAD WITHOUT CONTRAST TECHNIQUE: Contiguous axial images were obtained from the base of the skull through the vertex without intravenous contrast. RADIATION DOSE REDUCTION: This exam was performed according to the departmental dose-optimization program which includes automated exposure control, adjustment of the mA and/or kV according to patient size and/or use of iterative reconstruction technique. COMPARISON:  Head CT 10/14/2020. FINDINGS: Brain: Moderate cerebral and mild cerebellar atrophy. Patchy and confluent areas of decreased attenuation are noted throughout the deep and periventricular white matter of the cerebral hemispheres bilaterally, compatible with chronic microvascular ischemic disease. Associated with the anterior aspect of the falx there is a well-defined 1.4 cm densely calcified lesion, compatible with a meningioma, stable compared to prior studies. No evidence of acute infarction, hemorrhage, hydrocephalus, extra-axial collection or new mass lesion/mass effect. Vascular: No hyperdense vessel or unexpected calcification. Skull: Normal. Negative for fracture or  focal lesion. Sinuses/Orbits: No acute finding. Other: None. IMPRESSION: 1. No evidence of acute traumatic injury to the skull or brain. 2. Moderate cerebral and mild cerebellar atrophy with extensive chronic microvascular ischemic changes in the cerebral white matter, similar to prior studies, as above. 3. 14 mm meningioma of the anterior aspect of the falx cerebri, unchanged. Electronically Signed   By: Vinnie Langton M.D.   On: 08/29/2021 06:26    Procedures Procedures    Medications Ordered in ED Medications - No data to display  ED Course/ Medical Decision Making/ A&P                           Medical Decision Making Amount  and/or Complexity of Data Reviewed Labs: ordered. Radiology: ordered.   Pt with hx/o dementia, on plavix here for evaluation following a fall with head injury.  She is awake and alert on evaluation, has ecchymosis to right forehead.  No tenderness to extremities.  Imaging with no evidence of acute traumatic abnormality.  Given patient's recent vomiting recommend labs to eval for electrolyte abnormality or UTI.  Pt care transferred pending labs.         Final Clinical Impression(s) / ED Diagnoses Final diagnoses:  None    Rx / DC Orders ED Discharge Orders     None         Quintella Reichert, MD 08/29/21 [phone]

## 2021-08-29 NOTE — Progress Notes (Signed)
Orthopedic Tech Progress Note Patient Details:  Brenda Lynch [birth year] [id number]  Patient ID: Brenda Lynch, female   DOB: January 04, 1924, 88 y.o.   MRN: [id number] I attended trauma page. Brenda Lynch 08/29/2021, 7:32 AM

## 2021-08-29 NOTE — ED Provider Triage Note (Signed)
Emergency Medicine Provider Triage Evaluation Note  Brenda Lynch , a 88 y.o. female  was evaluated in triage.  Pt complains of fall on thinners.  Patient has hx of dementia, getting out of bed and carpet beneath her slipped so she struck her head on the nightstand.  No LOC.  Has contusion noted to right forehead.  No vomiting.  Hx of dementia per caregiver, at baseline.  Review of Systems  Positive: Fall on thinners Negative: fever  Physical Exam  BP 132/90 (BP Location: Left Arm)   Pulse 65   Temp 98 F (36.7 C) (Oral)   Resp 18   Ht '5\' 5"'$  (1.651 m)   Wt 52.2 kg   SpO2 98%   BMI 19.14 kg/m   Gen:   Awake, no distress, demented, answering limited questions   Resp:  Normal effort  MSK:   Moves extremities without difficulty  Other:  Contusion and bruising noted to right forehead  Medical Decision Making  Medically screening exam initiated at 6:01 AM.  Appropriate orders placed.  Brenda Lynch was informed that the remainder of the evaluation will be completed by another provider, this initial triage assessment does not replace that evaluation, and the importance of remaining in the ED until their evaluation is complete.  Fall on plavix.  Per charge RN, activate level II trauma.  Labs and head CT ordered.  Handoff given to MD assuming care.   [name], PA-C 08/29/21 [phone]

## 2021-08-29 NOTE — ED Provider Notes (Signed)
Patient signed out to me by previous provider. Please refer to their note for full HPI.  Briefly this is a 88 year old female who presented for mechanical fall, with head injury.  CT imaging negative for traumatic finding.  Report of intermittent vomiting.  Pending lab evaluation/urinalysis. Physical Exam  BP (!) 197/86   Pulse 72   Temp 98 F (36.7 C) (Oral)   Resp 15   Ht '5\' 5"'$  (1.651 m)   Wt 52.2 kg   SpO2 100%   BMI 19.14 kg/m   Physical Exam Vitals and nursing note reviewed.  Constitutional:      Appearance: Normal appearance.  HENT:     Head: Normocephalic.     Mouth/Throat:     Mouth: Mucous membranes are moist.  Cardiovascular:     Rate and Rhythm: Normal rate.  Pulmonary:     Effort: Pulmonary effort is normal. No respiratory distress.  Skin:    General: Skin is warm.  Neurological:     Mental Status: She is alert. Mental status is at baseline.     Procedures  Procedures  ED Course / MDM    Medical Decision Making Amount and/or Complexity of Data Reviewed Labs: ordered. Radiology: ordered.   Lab evaluation is baseline without any acute findings.  Urinalysis shows no UTI.  Legal guardian at bedside, notified of discharge plan.  Patient at this time appears safe and stable for discharge and close outpatient follow up. Discharge plan and strict return to ED precautions discussed, patient verbalizes understanding and agreement.       Lorelle Gibbs, [place] 08/29/21 [phone]

## 2021-08-29 NOTE — ED Triage Notes (Signed)
Arrives POV from home after hitting right side of head above eyebrows on the night stand around 530AM.   Compliant on Plavix.

## 2021-10-04 DIAGNOSIS — J309 Allergic rhinitis, unspecified: Secondary | ICD-10-CM | POA: Diagnosis not present

## 2021-10-04 DIAGNOSIS — E876 Hypokalemia: Secondary | ICD-10-CM | POA: Diagnosis not present

## 2021-10-04 DIAGNOSIS — F0390 Unspecified dementia without behavioral disturbance: Secondary | ICD-10-CM | POA: Diagnosis not present

## 2021-10-04 DIAGNOSIS — M1990 Unspecified osteoarthritis, unspecified site: Secondary | ICD-10-CM | POA: Diagnosis not present

## 2021-10-04 DIAGNOSIS — I2510 Atherosclerotic heart disease of native coronary artery without angina pectoris: Secondary | ICD-10-CM | POA: Diagnosis not present

## 2021-10-04 DIAGNOSIS — I10 Essential (primary) hypertension: Secondary | ICD-10-CM | POA: Diagnosis not present

## 2021-10-04 DIAGNOSIS — E785 Hyperlipidemia, unspecified: Secondary | ICD-10-CM | POA: Diagnosis not present

## 2021-10-04 DIAGNOSIS — E1151 Type 2 diabetes mellitus with diabetic peripheral angiopathy without gangrene: Secondary | ICD-10-CM | POA: Diagnosis not present

## 2021-10-04 DIAGNOSIS — K219 Gastro-esophageal reflux disease without esophagitis: Secondary | ICD-10-CM | POA: Diagnosis not present

## 2021-10-08 ENCOUNTER — Emergency Department (HOSPITAL_COMMUNITY)
Admission: EM | Admit: 2021-10-08 | Discharge: 2021-10-08 | Disposition: A | Discharge status: Home / Self Care | Payer: Medicare PPO | Attending: Emergency Medicine | Admitting: Emergency Medicine

## 2021-10-08 ENCOUNTER — Emergency Department (HOSPITAL_COMMUNITY): Payer: Medicare PPO

## 2021-10-08 ENCOUNTER — Other Ambulatory Visit: Payer: Self-pay

## 2021-10-08 ENCOUNTER — Encounter (HOSPITAL_COMMUNITY): Payer: Self-pay | Admitting: Emergency Medicine

## 2021-10-08 DIAGNOSIS — I130 Hypertensive heart and chronic kidney disease with heart failure and stage 1 through stage 4 chronic kidney disease, or unspecified chronic kidney disease: Secondary | ICD-10-CM | POA: Diagnosis not present

## 2021-10-08 DIAGNOSIS — W01198A Fall on same level from slipping, tripping and stumbling with subsequent striking against other object, initial encounter: Secondary | ICD-10-CM | POA: Diagnosis not present

## 2021-10-08 DIAGNOSIS — F0390 Unspecified dementia without behavioral disturbance: Secondary | ICD-10-CM | POA: Insufficient documentation

## 2021-10-08 DIAGNOSIS — Z7901 Long term (current) use of anticoagulants: Secondary | ICD-10-CM | POA: Diagnosis not present

## 2021-10-08 DIAGNOSIS — Y9301 Activity, walking, marching and hiking: Secondary | ICD-10-CM | POA: Diagnosis not present

## 2021-10-08 DIAGNOSIS — E1122 Type 2 diabetes mellitus with diabetic chronic kidney disease: Secondary | ICD-10-CM | POA: Insufficient documentation

## 2021-10-08 DIAGNOSIS — S4992XA Unspecified injury of left shoulder and upper arm, initial encounter: Secondary | ICD-10-CM | POA: Diagnosis present

## 2021-10-08 DIAGNOSIS — Y92008 Other place in unspecified non-institutional (private) residence as the place of occurrence of the external cause: Secondary | ICD-10-CM | POA: Insufficient documentation

## 2021-10-08 DIAGNOSIS — I509 Heart failure, unspecified: Secondary | ICD-10-CM | POA: Diagnosis not present

## 2021-10-08 DIAGNOSIS — S0003XA Contusion of scalp, initial encounter: Secondary | ICD-10-CM | POA: Insufficient documentation

## 2021-10-08 DIAGNOSIS — S0990XA Unspecified injury of head, initial encounter: Secondary | ICD-10-CM | POA: Diagnosis not present

## 2021-10-08 DIAGNOSIS — S42032A Displaced fracture of lateral end of left clavicle, initial encounter for closed fracture: Secondary | ICD-10-CM | POA: Diagnosis not present

## 2021-10-08 DIAGNOSIS — S199XXA Unspecified injury of neck, initial encounter: Secondary | ICD-10-CM | POA: Diagnosis not present

## 2021-10-08 DIAGNOSIS — Z7902 Long term (current) use of antithrombotics/antiplatelets: Secondary | ICD-10-CM | POA: Insufficient documentation

## 2021-10-08 DIAGNOSIS — Z86011 Personal history of benign neoplasm of the brain: Secondary | ICD-10-CM | POA: Diagnosis not present

## 2021-10-08 DIAGNOSIS — I2510 Atherosclerotic heart disease of native coronary artery without angina pectoris: Secondary | ICD-10-CM | POA: Insufficient documentation

## 2021-10-08 DIAGNOSIS — N189 Chronic kidney disease, unspecified: Secondary | ICD-10-CM | POA: Diagnosis not present

## 2021-10-08 DIAGNOSIS — Z7982 Long term (current) use of aspirin: Secondary | ICD-10-CM | POA: Diagnosis not present

## 2021-10-08 DIAGNOSIS — S6992XA Unspecified injury of left wrist, hand and finger(s), initial encounter: Secondary | ICD-10-CM | POA: Diagnosis not present

## 2021-10-08 DIAGNOSIS — S42035A Nondisplaced fracture of lateral end of left clavicle, initial encounter for closed fracture: Secondary | ICD-10-CM | POA: Insufficient documentation

## 2021-10-08 DIAGNOSIS — Z79899 Other long term (current) drug therapy: Secondary | ICD-10-CM | POA: Insufficient documentation

## 2021-10-08 DIAGNOSIS — D649 Anemia, unspecified: Secondary | ICD-10-CM | POA: Diagnosis not present

## 2021-10-08 DIAGNOSIS — J439 Emphysema, unspecified: Secondary | ICD-10-CM | POA: Diagnosis not present

## 2021-10-08 DIAGNOSIS — M47812 Spondylosis without myelopathy or radiculopathy, cervical region: Secondary | ICD-10-CM | POA: Diagnosis not present

## 2021-10-08 DIAGNOSIS — Z043 Encounter for examination and observation following other accident: Secondary | ICD-10-CM | POA: Diagnosis not present

## 2021-10-08 DIAGNOSIS — J9811 Atelectasis: Secondary | ICD-10-CM | POA: Diagnosis not present

## 2021-10-08 DIAGNOSIS — I10 Essential (primary) hypertension: Secondary | ICD-10-CM | POA: Diagnosis not present

## 2021-10-08 DIAGNOSIS — W19XXXA Unspecified fall, initial encounter: Secondary | ICD-10-CM

## 2021-10-08 DIAGNOSIS — M4312 Spondylolisthesis, cervical region: Secondary | ICD-10-CM | POA: Diagnosis not present

## 2021-10-08 LAB — CBC WITH DIFFERENTIAL/PLATELET
Abs Immature Granulocytes: 0.02 10*3/uL (ref 0.00–0.07)
Basophils Absolute: 0 10*3/uL (ref 0.0–0.1)
Basophils Relative: 0 %
Eosinophils Absolute: 0 10*3/uL (ref 0.0–0.5)
Eosinophils Relative: 0 %
HCT: 36.5 % (ref 36.0–46.0)
Hemoglobin: 11.8 g/dL — ABNORMAL LOW (ref 12.0–15.0)
Immature Granulocytes: 0 %
Lymphocytes Relative: 16 %
Lymphs Abs: 1.6 10*3/uL (ref 0.7–4.0)
MCH: 29.6 pg (ref 26.0–34.0)
MCHC: 32.3 g/dL (ref 30.0–36.0)
MCV: 91.7 fL (ref 80.0–100.0)
Monocytes Absolute: 0.6 10*3/uL (ref 0.1–1.0)
Monocytes Relative: 6 %
Neutro Abs: 7.7 10*3/uL (ref 1.7–7.7)
Neutrophils Relative %: 78 %
Platelets: 239 10*3/uL (ref 150–400)
RBC: 3.98 MIL/uL (ref 3.87–5.11)
RDW: 16.5 % — ABNORMAL HIGH (ref 11.5–15.5)
WBC: 10 10*3/uL (ref 4.0–10.5)
nRBC: 0 % (ref 0.0–0.2)

## 2021-10-08 LAB — I-STAT CHEM 8, ED
BUN: 25 mg/dL — ABNORMAL HIGH (ref 8–23)
Calcium, Ion: 1.17 mmol/L (ref 1.15–1.40)
Chloride: 102 mmol/L (ref 98–111)
Creatinine, Ser: 1.3 mg/dL — ABNORMAL HIGH (ref 0.44–1.00)
Glucose, Bld: 113 mg/dL — ABNORMAL HIGH (ref 70–99)
HCT: 38 % (ref 36.0–46.0)
Hemoglobin: 12.9 g/dL (ref 12.0–15.0)
Potassium: 3.9 mmol/L (ref 3.5–5.1)
Sodium: 140 mmol/L (ref 135–145)
TCO2: 24 mmol/L (ref 22–32)

## 2021-10-08 LAB — COMPREHENSIVE METABOLIC PANEL
ALT: 16 U/L (ref 0–44)
AST: 26 U/L (ref 15–41)
Albumin: 3.9 g/dL (ref 3.5–5.0)
Alkaline Phosphatase: 75 U/L (ref 38–126)
Anion gap: 11 (ref 5–15)
BUN: 24 mg/dL — ABNORMAL HIGH (ref 8–23)
CO2: 24 mmol/L (ref 22–32)
Calcium: 9.2 mg/dL (ref 8.9–10.3)
Chloride: 102 mmol/L (ref 98–111)
Creatinine, Ser: 1.33 mg/dL — ABNORMAL HIGH (ref 0.44–1.00)
GFR, Estimated: 37 mL/min — ABNORMAL LOW (ref >60)
Glucose, Bld: 117 mg/dL — ABNORMAL HIGH (ref 70–99)
Potassium: 3.8 mmol/L (ref 3.5–5.1)
Sodium: 137 mmol/L (ref 135–145)
Total Bilirubin: 0.6 mg/dL (ref 0.3–1.2)
Total Protein: 7.3 g/dL (ref 6.5–8.1)

## 2021-10-08 LAB — URINALYSIS, ROUTINE W REFLEX MICROSCOPIC
Bilirubin Urine: NEGATIVE
Glucose, UA: NEGATIVE mg/dL
Ketones, ur: NEGATIVE mg/dL
Nitrite: NEGATIVE
Protein, ur: NEGATIVE mg/dL
Specific Gravity, Urine: 1.015 (ref 1.005–1.030)
pH: 6.5 (ref 5.0–8.0)

## 2021-10-08 LAB — URINALYSIS, MICROSCOPIC (REFLEX)

## 2021-10-08 LAB — TYPE AND SCREEN
ABO/RH(D): B POS
Antibody Screen: NEGATIVE

## 2021-10-08 LAB — PROTIME-INR
INR: 0.9 (ref 0.8–1.2)
Prothrombin Time: 12.4 seconds (ref 11.4–15.2)

## 2021-10-08 LAB — ABO/RH: ABO/RH(D): B POS

## 2021-10-08 MED ORDER — LACTATED RINGERS IV BOLUS
500.0000 mL | Freq: Once | INTRAVENOUS | Status: AC
  Administered 2021-10-08: 500 mL via INTRAVENOUS

## 2021-10-08 MED ORDER — ACETAMINOPHEN 325 MG PO TABS
650.0000 mg | ORAL_TABLET | Freq: Once | ORAL | Status: DC
  Filled 2021-10-08: qty 2

## 2021-10-08 NOTE — Progress Notes (Signed)
Orthopedic Tech Progress Note Patient Details:  MARCHELLA HIBBARD 12-Apr-1923 [id number]  Patient ID: Makayli B Polcyn, female   DOB: 02/24/1924, 88 y.o.   MRN: [id number] Level 2 trauma not needed. Edwina Barth 10/08/2021, 6:55 AM

## 2021-10-08 NOTE — ED Notes (Addendum)
Patient and caregiver verbalizes understanding of discharge instructions. Opportunity for questioning and answers were provided. Pt discharged from ED.

## 2021-10-08 NOTE — Progress Notes (Signed)
Chaplain responding to page for pt Brenda Lynch regarding level 2 trauma as a result from a fall on thinners. Pt unavailable at time of visit and no family present at this time.     10/08/21 0800  Clinical Encounter Type  Visited With Patient not available  Visit Type Initial;ED;Trauma

## 2021-10-08 NOTE — ED Notes (Signed)
Pt's son stated she had to go to the bathroom. This tech and orientee, Bubba Hales went into the room to assist. Pt is here for a fall with fractured clavicle and I did not feel safe walking her to the bathroom at this time considering she is a fall risk. I advised pt's son that we could try the bed pan because I wasn't sure it was a good idea for her to be walking at this time. The son became agitated and started talking very aggressively and loudly to me. I asked the son to please lower his voice and he continued to yell and stated that he had to deal with all this mess and he wasn't going to have his mom use the bed pan. I told the son I was afraid if we attempted to walk her to the bathroom that she would fall and he replied with well if she falls then I'll pick her up so it's not a liability on you. I apologized to the son and told him I just didn't feel comfortable walking her and I would advise her nurse of the situation. I then left the room, orientee Bubba Hales was in the room with me the entire time. RN notified.

## 2021-10-08 NOTE — ED Triage Notes (Signed)
Patient from home, walking down hallway without her cane and tripped.  She fell, hit the left side of her head, left shoulder and left knee.  Patient is on plavix.  She is at her baseline.  She walked after the fall.

## 2021-10-08 NOTE — ED Provider Triage Note (Signed)
Emergency Medicine Provider Triage Evaluation Note  Brenda Lynch , a 88 y.o. female  was evaluated in triage.  Pt complains of fall.  Per caregiver, was walking down hallway this morning without her walker when she bumped into a picture on the wall causing her to fall, impact on left head, shoulder, and knee.  She did not lose consciousness.  She has been at baseline since this occurred and has been ambulatory with her cane.  She is on Plavix.  Review of Systems  Positive: fall Negative:   Physical Exam  BP (!) 158/90 (BP Location: Right Arm)   Pulse 80   Temp 97.8 F (36.6 C)   Resp 15   SpO2 96%  Gen:   Awake, no distress   Resp:  Normal effort  MSK:   Moves extremities without difficulty  Other:  Contusion noted to left temple, tender left shoulder, bruising left medial knee  Medical Decision Making  Medically screening exam initiated at 6:40 AM.  Appropriate orders placed.  [name] was informed that the remainder of the evaluation will be completed by another provider, this initial triage assessment does not replace that evaluation, and the importance of remaining in the ED until their evaluation is complete.  Fall, on plavix.  Level II activated due to fall on thinners with + head trauma.  Will also obtain films of left shoulder/knee.  Labs ordered.  Charge RN aware-- moving to acute bed.   [name], PA-C 10/08/21 [phone]

## 2021-10-08 NOTE — ED Provider Notes (Signed)
[facility] EMERGENCY DEPARTMENT Provider Note   CSN: [id number] Arrival date & time: 10/08/21  3235     History  Chief Complaint  Patient presents with   Fall    Brenda Lynch is a 87 y.o. female.  HPI 88 year old female with a history of CAD, hypercholesteremia, meningioma, CHF, CKD, hypertension, DM type II, TIA on Plavix presented to the ER as a level 2 for fall on blood thinners.  The patient has dementia at baseline, history provided by the patient and her caregiver at bedside.  Patient reportedly ambulates with a cane at baseline, was being encouraged by staff to use it while walking but refused. She was walking down the hallway and fell into a picture frame, hitting the left side of her head and then fell onto her left side. Was at her mental baseline before and after the fall per staff. Patient complains of left shoulder pain. Denies headache, weakness, fevers, chills.     Home Medications Prior to Admission medications   Medication Sig Start Date End Date Taking? Authorizing Provider  acetaminophen (TYLENOL) 500 MG tablet Take 500 mg by mouth every 6 (six) hours as needed (for headaches or mild pain).    [provider]  aspirin 325 MG tablet Take 1 tablet (325 mg total) by mouth daily. Patient not taking: Reported on 08/29/2021 06/12/20   Hosie Poisson, MD  atorvastatin (LIPITOR) 20 MG tablet Take 1 tablet (20 mg total) by mouth daily. Patient taking differently: Take 20 mg by mouth at bedtime. 06/12/20   Hosie Poisson, MD  calcium carbonate (TUMS - DOSED IN MG ELEMENTAL CALCIUM) 500 MG chewable tablet Chew 1 tablet by mouth daily as needed for indigestion or heartburn.    [provider]  Cholecalciferol (VITAMIN D3) 25 MCG (1000 UT) CAPS Take 1,000 Units by mouth in the morning.    [provider]  clopidogrel (PLAVIX) 75 MG tablet Take 1 tablet (75 mg total) by mouth daily. 06/12/20   Hosie Poisson, MD  fluticasone (FLONASE) 50  MCG/ACT nasal spray Place 1-2 sprays into both nostrils daily as needed for allergies or rhinitis.    [provider]  hydrochlorothiazide (MICROZIDE) 12.5 MG capsule TAKE ONE CAPSULE ONCE DAILY Patient taking differently: Take 12.5 mg by mouth daily. 05/17/21   Sueanne Margarita, MD  hydrOXYzine (ATARAX) 25 MG tablet Take 25 mg by mouth at bedtime as needed for anxiety.    [provider]  loperamide (IMODIUM A-D) 2 MG tablet Take 2 mg by mouth 4 (four) times daily as needed for diarrhea or loose stools.    [provider]  Melatonin 10 MG TABS Take 10 mg by mouth at bedtime as needed (for sleep).    [provider]  Misc Natural Products (OSTEO BI-FLEX ADV DOUBLE ST PO) Take 1 tablet by mouth daily.    [provider]  montelukast (SINGULAIR) 10 MG tablet Take 10 mg by mouth at bedtime. 12/12/12   [provider]  ondansetron (ZOFRAN) 4 MG tablet Take 4 mg by mouth daily as needed for nausea or vomiting. 09/27/20   [provider]  Polyethyl Glyc-Propyl Glyc PF (SYSTANE HYDRATION PF) 0.4-0.3 % SOLN Place 1 drop into both eyes 3 (three) times daily as needed (for irritation).    [provider]  polyethylene glycol powder (GLYCOLAX/MIRALAX) 17 GM/SCOOP powder Take 8.5-17 g by mouth daily as needed for mild constipation (MIX AND DRINK).    [provider]  potassium chloride (MICRO-K) 10 MEQ CR capsule Take 10 mEq by mouth daily.    [provider]  PRILOSEC OTC 20 MG tablet Take 20 mg by mouth daily before breakfast.    [provider]  ramipril (ALTACE) 10 MG capsule Take 10 mg by mouth daily.    [provider]  Saline (SIMPLY SALINE) 0.9 % AERS Place 1 spray into both nostrils as needed (for congestion).    [provider]  traZODone (DESYREL) 50 MG tablet Take 25 mg by mouth at bedtime as needed for sleep. 10/09/20   [provider]      Allergies    Ciprofloxacin,  Doxycycline hyclate, Penicillins, and Penicillin g    Review of Systems   Review of Systems Ten systems reviewed and are negative for acute change, except as noted in the HPI.   Physical Exam Updated Vital Signs BP (!) 149/99 (BP Location: Right Arm)   Pulse 63   Temp 97.9 F (36.6 C) (Oral)   Resp 18   SpO2 100%  Physical Exam Vitals and nursing note reviewed.  Constitutional:      General: She is not in acute distress.    Appearance: She is well-developed.  HENT:     Head: Normocephalic and atraumatic.     Comments: No of hemotympanum, raccoon eyes, battle sign.  He has a small hematoma to the left upper scalp just above her temple.  No mastoid tenderness.  No malocclusion.  No evidence of lacerations. Full range of motion of head and neck    Eyes:     Conjunctiva/sclera: Conjunctivae normal.  Cardiovascular:     Rate and Rhythm: Normal rate and regular rhythm.     Heart sounds: No murmur heard. Pulmonary:     Effort: Pulmonary effort is normal. No respiratory distress.     Breath sounds: Normal breath sounds.  Abdominal:     Palpations: Abdomen is soft.     Tenderness: There is no abdominal tenderness.  Musculoskeletal:        General: No swelling.     Cervical back: Neck supple.     Comments: Tenderness to palpation to the left shoulder, able to range above midlines, no deformities, stepoffs, crepitus.  No midline tenderness of the C, T, L-spine.  No crepitus or step-offs.  Skin:    General: Skin is warm and dry.     Capillary Refill: Capillary refill takes less than 2 seconds.     Findings: No erythema.  Neurological:     General: No focal deficit present.     Mental Status: She is alert and oriented to person, place, and time.     Comments: Mental Status:  Alert, thought content appropriate, able to give a coherent history. Speech fluent without evidence of aphasia. Follows two step commands with some prompting. Cranial Nerves:  II:  Peripheral visual fields  grossly normal, pupils equal, round, reactive to light III,IV, VI: ptosis not present, extra-ocular motions intact bilaterally  V,VII: smile symmetric, facial light touch sensation equal VIII: hearing grossly normal to voice  X: uvula elevates symmetrically  XI: bilateral shoulder shrug symmetric and strong XII: midline tongue extension without fassiculations Motor:  Normal tone. 5/5 strength of BUE and BLE major muscle groups including strong and equal grip strength and dorsiflexion/plantar flexion Sensory: light touch normal in all extremities. Cerebellar: normal finger-to-nose with bilateral upper extremities, Romberg sign absent Gait: not accessed     Psychiatric:  Mood and Affect: Mood normal.     ED Results / Procedures / Treatments   Labs (all labs ordered are listed, but only abnormal results are displayed) Labs Reviewed  CBC WITH DIFFERENTIAL/PLATELET - Abnormal; Notable for the following components:      Result Value   Hemoglobin 11.8 (*)    RDW 16.5 (*)    All other components within normal limits  COMPREHENSIVE METABOLIC PANEL - Abnormal; Notable for the following components:   Glucose, Bld 117 (*)    BUN 24 (*)    Creatinine, Ser 1.33 (*)    GFR, Estimated 37 (*)    All other components within normal limits  URINALYSIS, ROUTINE W REFLEX MICROSCOPIC - Abnormal; Notable for the following components:   Hgb urine dipstick TRACE (*)    Leukocytes,Ua MODERATE (*)    All other components within normal limits  URINALYSIS, MICROSCOPIC (REFLEX) - Abnormal; Notable for the following components:   Bacteria, UA RARE (*)    All other components within normal limits  I-STAT CHEM 8, ED - Abnormal; Notable for the following components:   BUN 25 (*)    Creatinine, Ser 1.30 (*)    Glucose, Bld 113 (*)    All other components within normal limits  PROTIME-INR  TYPE AND SCREEN    EKG EKG Interpretation  Date/Time:  Saturday October 08 2021 06:57:39 EDT Ventricular  Rate:  68 PR Interval:  225 QRS Duration: 103 QT Interval:  436 QTC Calculation: 464 R Axis:   27 Text Interpretation: Sinus rhythm Prolonged PR interval Probable anteroseptal infarct, old Abnrm T, consider ischemia, anterolateral lds No significant change since last tracing Confirmed by Deno Etienne [phone]) on 10/08/2021 7:05:05 AM  Radiology DG Chest Portable 1 View  Result Date: 10/08/2021 CLINICAL DATA:  Status post fall.  Hit left side of head. EXAM: PORTABLE CHEST 1 VIEW COMPARISON:  08/29/2021 FINDINGS: Stable cardiomediastinal contours. There is no pleural effusion or edema. The lungs appear hyper in the lungs appear hypoinflated compared with previous exam. Mild subsegmental atelectasis noted in the left base. Signs of distal left clavicle fracture identified. No displaced rib fractures identified. IMPRESSION: 1. Low lung volumes with mild subsegmental atelectasis in the left base. 2. Distal left clavicle fracture. Electronically Signed   By: Kerby Moors M.D.   On: 10/08/2021 08:13   CT HEAD WO CONTRAST (5MM)  Result Date: 10/08/2021 CLINICAL DATA:  Fall from standing.  Left hand injury. EXAM: CT HEAD WITHOUT CONTRAST CT CERVICAL SPINE WITHOUT CONTRAST TECHNIQUE: Multidetector CT imaging of the head and cervical spine was performed following the standard protocol without intravenous contrast. Multiplanar CT image reconstructions of the cervical spine were also generated. RADIATION DOSE REDUCTION: This exam was performed according to the departmental dose-optimization program which includes automated exposure control, adjustment of the mA and/or kV according to patient size and/or use of iterative reconstruction technique. COMPARISON:  08/29/2021 FINDINGS: CT HEAD FINDINGS Brain: Oblique imaging plane hinders assessment. Within this limitation there is no evidence for an acute hemorrhage, hydrocephalus, mass effect, or abnormal extra-axial fluid collection. Diffuse loss of parenchymal volume  is consistent with atrophy. Patchy low attenuation in the deep hemispheric and periventricular white matter is nonspecific, but likely reflects chronic microvascular ischemic demyelination. Stable appearance calcified meningioma along the anterior falx. Vascular: No hyperdense vessel or unexpected calcification. Skull: No evidence for fracture. No worrisome lytic or sclerotic lesion. Sinuses/Orbits: The visualized paranasal sinuses and mastoid air cells are clear. Visualized portions of the globes and  intraorbital fat are unremarkable. Other: None. CT CERVICAL SPINE FINDINGS Markedly motion degraded study. Exam is nondiagnostic in some regions. Alignment: Similar reversal of normal cervical lordosis. Trace anterolisthesis of C3 on 4 is stable. New trace anterolisthesis of C4 on 5. Skull base and vertebrae: Axial imaging demonstrates an apparent linear lucency through the left C2 lateral mass which I think is secondary to oblique imaging through the C1-2 articulation is no definite fracture can be identified on sagittal or coronal imaging. Again, the marked motion artifact markedly hinders assessment. Soft tissues and spinal canal: Limited assessment without substantial prevertebral edema or hematoma. Disc levels: Diffuse loss of disc height noted in the cervical spine, most pronounced at C4-5 C5-6 and C6-7. Upper chest: Subtle changes of emphysema. Other: None. IMPRESSION: 1. No acute intracranial abnormality. 2. Atrophy with chronic small vessel ischemic disease. 3. Markedly motion degraded study of the cervical spine. Exam is nondiagnostic in some regions. Repeat CT cervical spine CT recommended when patient is better able to participate with positioning and holding still. 4. Diffuse degenerative changes in the cervical spine with new trace anterolisthesis of C4 on 5. Electronically Signed   By: Misty Stanley M.D.   On: 10/08/2021 07:49   CT Cervical Spine Wo Contrast  Result Date: 10/08/2021 CLINICAL DATA:   Fall from standing.  Left hand injury. EXAM: CT HEAD WITHOUT CONTRAST CT CERVICAL SPINE WITHOUT CONTRAST TECHNIQUE: Multidetector CT imaging of the head and cervical spine was performed following the standard protocol without intravenous contrast. Multiplanar CT image reconstructions of the cervical spine were also generated. RADIATION DOSE REDUCTION: This exam was performed according to the departmental dose-optimization program which includes automated exposure control, adjustment of the mA and/or kV according to patient size and/or use of iterative reconstruction technique. COMPARISON:  08/29/2021 FINDINGS: CT HEAD FINDINGS Brain: Oblique imaging plane hinders assessment. Within this limitation there is no evidence for an acute hemorrhage, hydrocephalus, mass effect, or abnormal extra-axial fluid collection. Diffuse loss of parenchymal volume is consistent with atrophy. Patchy low attenuation in the deep hemispheric and periventricular white matter is nonspecific, but likely reflects chronic microvascular ischemic demyelination. Stable appearance calcified meningioma along the anterior falx. Vascular: No hyperdense vessel or unexpected calcification. Skull: No evidence for fracture. No worrisome lytic or sclerotic lesion. Sinuses/Orbits: The visualized paranasal sinuses and mastoid air cells are clear. Visualized portions of the globes and intraorbital fat are unremarkable. Other: None. CT CERVICAL SPINE FINDINGS Markedly motion degraded study. Exam is nondiagnostic in some regions. Alignment: Similar reversal of normal cervical lordosis. Trace anterolisthesis of C3 on 4 is stable. New trace anterolisthesis of C4 on 5. Skull base and vertebrae: Axial imaging demonstrates an apparent linear lucency through the left C2 lateral mass which I think is secondary to oblique imaging through the C1-2 articulation is no definite fracture can be identified on sagittal or coronal imaging. Again, the marked motion artifact  markedly hinders assessment. Soft tissues and spinal canal: Limited assessment without substantial prevertebral edema or hematoma. Disc levels: Diffuse loss of disc height noted in the cervical spine, most pronounced at C4-5 C5-6 and C6-7. Upper chest: Subtle changes of emphysema. Other: None. IMPRESSION: 1. No acute intracranial abnormality. 2. Atrophy with chronic small vessel ischemic disease. 3. Markedly motion degraded study of the cervical spine. Exam is nondiagnostic in some regions. Repeat CT cervical spine CT recommended when patient is better able to participate with positioning and holding still. 4. Diffuse degenerative changes in the cervical spine with new trace anterolisthesis of  C4 on 5. Electronically Signed   By: Misty Stanley M.D.   On: 10/08/2021 07:49   DG Shoulder Left  Result Date: 10/08/2021 CLINICAL DATA:  Fall EXAM: LEFT SHOULDER - 2+ VIEW COMPARISON:  None Available. FINDINGS: Lateral third left clavicle fracture, at and lateral to the coracoid process. Generalized osteopenia. Located glenohumeral joint. IMPRESSION: Lateral third left clavicle fracture without displacement. Electronically Signed   By: Jorje Guild M.D.   On: 10/08/2021 07:19   DG Knee Complete 4 Views Left  Result Date: 10/08/2021 CLINICAL DATA:  Fall EXAM: LEFT KNEE - COMPLETE 4+ VIEW COMPARISON:  None Available. FINDINGS: Chondrocalcinosis and pronounced osteopenia. No acute fracture or dislocation. Atherosclerosis. IMPRESSION: 1. No acute finding. 2. Prominent osteopenia. Electronically Signed   By: Jorje Guild M.D.   On: 10/08/2021 07:18    Procedures Procedures    Medications Ordered in ED Medications  lactated ringers bolus 500 mL (500 mLs Intravenous New Bag/Given 10/08/21 6301)    ED Course/ Medical Decision Making/ A&P                           Medical Decision Making Amount and/or Complexity of Data Reviewed Labs: ordered. Radiology: ordered.  88 year old female presented to the ER  as a level 2 trauma after a fall on Plavix.  On arrival, she is alert, oriented at baseline per caregiver at bedside.  She is moving all 4 extremities without difficulty, no obvious cranial nerve deformities.  Her strength is intact.  She does have some tenderness to the left shoulder but is able to range above midline.  She denies any chest pain.   Differential diagnosis includes mechanical fall, stroke, ACS, PE, sepsis/infection, UTI  Labs ordered in triage, reviewed and interpreted by me.  CBC with hemoglobin 11.8 which appears to be stable.  CMP with a creatinine 1.33 which is slightly above her baseline of 1.13 otherwise no other significant electrode abnormalities noted.  Imaging ordered, reviewed and interpreted by me, agree with radiology read.  Her CT of the head is unremarkable, her CT cervical spine shows no evidence of fracture though markedly is degraded by motion artifact.  I have low suspicion for cervical spine fracture as she is moving all 4 extremities without difficulty.  Her left shoulder x-ray shows a distal left third clavicular fracture with no displacement.  Her knee x-ray is negative.  Her chest x-ray shows low lung volumes, no evidence of rib fractures.   EKG ordered and reviewed and interpreted by me, nonischemic.  Patient received Tylenol with EMS, placed in a sling.  Received 500 cc bolus of fluids as her son states that it is difficult for the facility to convince her to drink fluids however she has been eating well.  Her UA shows moderate leukocytes, 0-5 white blood cells, or bacteria.  We will forego treatment at this time.  Referral to orthopedics provided.  I informed her son and caregiver at bedside of the shoulder x-ray findings.  Educated on sling use.  We discussed return precautions.    I did consider admission, however given negative head imaging, benign physical exam, no signs of infection, electrolyte derangements, nonischemic EKG, no indication for admission  at this time.  They voiced understanding and are agreeable.  Stable for discharge.  This was a shared visit with my supervising physician Dr. Tyrone Nine who independently saw and evaluated the patient & provided guidance in evaluation/management/disposition ,in agreement with care  Final Clinical Impression(s) / ED Diagnoses Final diagnoses:  Closed nondisplaced fracture of acromial end of left clavicle, initial encounter  Fall, initial encounter    Rx / DC Orders ED Discharge Orders     None         Garald Balding, PA-C 10/08/21 [place], DO 10/08/21 [phone]

## 2021-10-08 NOTE — Discharge Instructions (Signed)
You were evaluated in the Emergency Department and after careful evaluation, we did not find any emergent condition requiring admission or further testing in the hospital.  Your imaging today does show that you have a fracture of the left clavicle.  Please wear your sling at all times until evaluated by an orthopedic doctor.  Please follow-up with Dr. Stann Mainland whose contact information is provided in your discharge paperwork.  Take Tylenol for pain.  Please return to the Emergency Department if you experience any worsening of your condition.  We encourage you to follow up with a primary care provider.  Thank you for allowing [place] to be a part of your care.

## 2021-10-08 NOTE — ED Notes (Signed)
Pt transported to CT scan by this RN

## 2021-10-12 DIAGNOSIS — S42002A Fracture of unspecified part of left clavicle, initial encounter for closed fracture: Secondary | ICD-10-CM | POA: Insufficient documentation

## 2021-10-12 DIAGNOSIS — S42032A Displaced fracture of lateral end of left clavicle, initial encounter for closed fracture: Secondary | ICD-10-CM | POA: Diagnosis not present

## 2021-10-14 ENCOUNTER — Ambulatory Visit: Payer: Medicare PPO | Admitting: Podiatrist

## 2021-10-16 ENCOUNTER — Emergency Department (HOSPITAL_COMMUNITY)
Admission: EM | Admit: 2021-10-16 | Discharge: 2021-10-17 | Disposition: A | Discharge status: Home / Self Care | Payer: Medicare PPO | Attending: Emergency Medicine | Admitting: Emergency Medicine

## 2021-10-16 ENCOUNTER — Emergency Department (HOSPITAL_COMMUNITY): Payer: Medicare PPO

## 2021-10-16 DIAGNOSIS — Z7982 Long term (current) use of aspirin: Secondary | ICD-10-CM | POA: Insufficient documentation

## 2021-10-16 DIAGNOSIS — F0390 Unspecified dementia without behavioral disturbance: Secondary | ICD-10-CM | POA: Diagnosis not present

## 2021-10-16 DIAGNOSIS — Z7902 Long term (current) use of antithrombotics/antiplatelets: Secondary | ICD-10-CM | POA: Insufficient documentation

## 2021-10-16 DIAGNOSIS — Z79899 Other long term (current) drug therapy: Secondary | ICD-10-CM | POA: Diagnosis not present

## 2021-10-16 DIAGNOSIS — R509 Fever, unspecified: Secondary | ICD-10-CM | POA: Diagnosis not present

## 2021-10-16 DIAGNOSIS — R079 Chest pain, unspecified: Secondary | ICD-10-CM | POA: Diagnosis not present

## 2021-10-16 DIAGNOSIS — I2510 Atherosclerotic heart disease of native coronary artery without angina pectoris: Secondary | ICD-10-CM | POA: Diagnosis not present

## 2021-10-16 DIAGNOSIS — N3000 Acute cystitis without hematuria: Secondary | ICD-10-CM | POA: Insufficient documentation

## 2021-10-16 DIAGNOSIS — R410 Disorientation, unspecified: Secondary | ICD-10-CM | POA: Diagnosis not present

## 2021-10-16 DIAGNOSIS — E1165 Type 2 diabetes mellitus with hyperglycemia: Secondary | ICD-10-CM | POA: Insufficient documentation

## 2021-10-16 DIAGNOSIS — I130 Hypertensive heart and chronic kidney disease with heart failure and stage 1 through stage 4 chronic kidney disease, or unspecified chronic kidney disease: Secondary | ICD-10-CM | POA: Insufficient documentation

## 2021-10-16 DIAGNOSIS — I5032 Chronic diastolic (congestive) heart failure: Secondary | ICD-10-CM | POA: Insufficient documentation

## 2021-10-16 DIAGNOSIS — W19XXXA Unspecified fall, initial encounter: Secondary | ICD-10-CM | POA: Diagnosis not present

## 2021-10-16 DIAGNOSIS — N182 Chronic kidney disease, stage 2 (mild): Secondary | ICD-10-CM | POA: Diagnosis not present

## 2021-10-16 DIAGNOSIS — Z85841 Personal history of malignant neoplasm of brain: Secondary | ICD-10-CM | POA: Diagnosis not present

## 2021-10-16 DIAGNOSIS — E1122 Type 2 diabetes mellitus with diabetic chronic kidney disease: Secondary | ICD-10-CM | POA: Insufficient documentation

## 2021-10-16 NOTE — ED Provider Notes (Signed)
[facility] EMERGENCY DEPARTMENT Provider Note   CSN: [id number] Arrival date & time: 10/16/21  2219     History {Add pertinent medical, surgical, social history, OB history to HPI:1} Chief Complaint  Patient presents with   Altered Mental Status    Brenda Lynch is a 88 y.o. female.  The history is provided by the patient and a relative. The history is limited by the condition of the patient.  Altered Mental Status Presenting symptoms: confusion   Severity:  Moderate Progression:  Waxing and waning Chronicity:  New Context: dementia   Associated symptoms: fever   Associated symptoms: no vomiting    Patient with history of CKD, CAD, diabetes, dementia presents for confusion and fever. History is provided by the son.  Over the past day patient's had increasing confusion and was noted to have a fever of 101 around 10 PM patient was given Tylenol.  She has been ambulating over the past day, but did become more sleepy with the fever.  She was seen in the emergency department on July 22 for a fall, was diagnosed with a left clavicle fracture.  She has had no new falls since then and has been ambulatory She is not on any chronic pain medicines   Past Medical History:  Diagnosis Date   Allergic rhinitis    Chronic diastolic CHF (congestive heart failure) (HCC)    CKD (chronic kidney disease), stage II    Coronary artery disease    Diabetes mellitus    Diverticulosis    Drug-induced bradycardia 01/28/2016   Hypercholesterolemia    Hypertension    Hyponatremia    Meningioma (HCC)    Osteoarthritis    Sedentary lifestyle    Spinal stenosis    Vertigo, benign positional     Home Medications Prior to Admission medications   Medication Sig Start Date End Date Taking? Authorizing Provider  acetaminophen (TYLENOL) 500 MG tablet Take 500 mg by mouth every 6 (six) hours as needed (for headaches or mild pain).    [provider]  aspirin 325 MG tablet  Take 1 tablet (325 mg total) by mouth daily. Patient not taking: Reported on 08/29/2021 06/12/20   Hosie Poisson, MD  atorvastatin (LIPITOR) 20 MG tablet Take 1 tablet (20 mg total) by mouth daily. Patient taking differently: Take 20 mg by mouth at bedtime. 06/12/20   Hosie Poisson, MD  calcium carbonate (TUMS - DOSED IN MG ELEMENTAL CALCIUM) 500 MG chewable tablet Chew 1 tablet by mouth daily as needed for indigestion or heartburn.    [provider]  Cholecalciferol (VITAMIN D3) 25 MCG (1000 UT) CAPS Take 1,000 Units by mouth in the morning.    [provider]  clopidogrel (PLAVIX) 75 MG tablet Take 1 tablet (75 mg total) by mouth daily. 06/12/20   Hosie Poisson, MD  fluticasone (FLONASE) 50 MCG/ACT nasal spray Place 1-2 sprays into both nostrils daily as needed for allergies or rhinitis.    [provider]  hydrochlorothiazide (MICROZIDE) 12.5 MG capsule TAKE ONE CAPSULE ONCE DAILY Patient taking differently: Take 12.5 mg by mouth daily. 05/17/21   Sueanne Margarita, MD  hydrOXYzine (ATARAX) 25 MG tablet Take 25 mg by mouth at bedtime as needed for anxiety.    [provider]  loperamide (IMODIUM A-D) 2 MG tablet Take 2 mg by mouth 4 (four) times daily as needed for diarrhea or loose stools.    [provider]  Melatonin 10 MG TABS Take 10 mg  by mouth at bedtime as needed (for sleep).    [provider]  Misc Natural Products (OSTEO BI-FLEX ADV DOUBLE ST PO) Take 1 tablet by mouth daily.    [provider]  montelukast (SINGULAIR) 10 MG tablet Take 10 mg by mouth at bedtime. 12/12/12   [provider]  ondansetron (ZOFRAN) 4 MG tablet Take 4 mg by mouth daily as needed for nausea or vomiting. 09/27/20   [provider]  Polyethyl Glyc-Propyl Glyc PF (SYSTANE HYDRATION PF) 0.4-0.3 % SOLN Place 1 drop into both eyes 3 (three) times daily as needed (for irritation).    [provider]  polyethylene glycol powder  (GLYCOLAX/MIRALAX) 17 GM/SCOOP powder Take 8.5-17 g by mouth daily as needed for mild constipation (MIX AND DRINK).    [provider]  potassium chloride (MICRO-K) 10 MEQ CR capsule Take 10 mEq by mouth daily.    [provider]  PRILOSEC OTC 20 MG tablet Take 20 mg by mouth daily before breakfast.    [provider]  ramipril (ALTACE) 10 MG capsule Take 10 mg by mouth daily.    [provider]  Saline (SIMPLY SALINE) 0.9 % AERS Place 1 spray into both nostrils as needed (for congestion).    [provider]  traZODone (DESYREL) 50 MG tablet Take 25 mg by mouth at bedtime as needed for sleep. 10/09/20   [provider]      Allergies    Ciprofloxacin, Doxycycline hyclate, Penicillins, and Penicillin g    Review of Systems   Review of Systems  Constitutional:  Positive for fever.  Respiratory:  Negative for cough and shortness of breath.   Gastrointestinal:  Negative for diarrhea and vomiting.  Psychiatric/Behavioral:  Positive for confusion.     Physical Exam Updated Vital Signs BP 132/79 (BP Location: Right Arm)   Pulse 88   Temp (!) 97.5 F (36.4 C) (Oral)   Resp 16   SpO2 100%  Physical Exam CONSTITUTIONAL: Elderly and frail, no acute distress HEAD: Normocephalic/atraumatic EYES: EOMI/PERRL ENMT: Mucous membranes moist NECK: supple no meningeal signs SPINE/BACK:entire spine nontender CV: S1/S2 noted, no murmurs/rubs/gallops noted LUNGS: Exam limited, but no acute distress noted no added lung sounds ABDOMEN: soft, nontender NEURO: Pt is sleeping but easily arousable.  She follows commands moves all extremities.  She is pleasantly confused EXTREMITIES: pulses normal/equal, left upper extremity is in a sling. No lacerations.  All other extremities/joints palpated/ranged and nontender SKIN: warm, color normal   ED Results / Procedures / Treatments   Labs (all labs ordered are listed, but only abnormal results are  displayed) Labs Reviewed  URINE CULTURE  URINALYSIS, ROUTINE W REFLEX MICROSCOPIC  BASIC METABOLIC PANEL  CBC WITH DIFFERENTIAL/PLATELET  CBG MONITORING, ED    EKG None  Radiology No results found.  Procedures Procedures  {Document cardiac monitor, telemetry assessment procedure when appropriate:1}  Medications Ordered in ED Medications - No data to display  ED Course/ Medical Decision Making/ A&P                           Medical Decision Making Amount and/or Complexity of Data Reviewed Labs: ordered. Radiology: ordered. ECG/medicine tests: ordered.   This patient presents to the ED for concern of fever, this involves an extensive number of treatment options, and is a complaint that carries with it a high risk of complications and morbidity.  The differential diagnosis includes but is not limited to UTI,  pneumonia, meningitis, sepsis  Comorbidities that complicate the patient evaluation: Patient's presentation is complicated by their history of dementia, coronary artery disease  Social Determinants of Health: Patient's  poor mobility   increases the complexity of managing their presentation  Additional history obtained: Additional history obtained from family most the history is provided by son Records reviewed  outpatient records reviewed  Lab Tests: I Ordered, and personally interpreted labs.  The pertinent results include:  ***  Imaging Studies ordered: I ordered imaging studies including X-ray chest   I independently visualized and interpreted imaging which showed *** I agree with the radiologist interpretation  Cardiac Monitoring: The patient was maintained on a cardiac monitor.  I personally viewed and interpreted the cardiac monitor which showed an underlying rhythm of:  {cardiac monitor:26849}  Medicines ordered and prescription drug management: I ordered medication including ***  for ***  Reevaluation of the patient after these medicines showed that  the patient    {resolved/improved/worsened:23923::"improved"}  Test Considered: Patient is low risk / negative by ***, therefore do not feel that *** is indicated.  Critical Interventions:  ***  Consultations Obtained: I requested consultation with the {consultation:26851}, and discussed  findings as well as pertinent plan - they recommend: ***  Reevaluation: After the interventions noted above, I reevaluated the patient and found that they have :{resolved/improved/worsened:23923::"improved"}  Complexity of problems addressed: Patient's presentation is most consistent with  {ZOXW:96045}  Disposition: After consideration of the diagnostic results and the patient's response to treatment,  I feel that the patent would benefit from {disposition:26850}.     {Document critical care time when appropriate:1} {Document review of labs and clinical decision tools ie heart score, Chads2Vasc2 etc:1}  {Document your independent review of radiology images, and any outside records:1} {Document your discussion with family members, caretakers, and with consultants:1} {Document social determinants of health affecting pt's care:1} {Document your decision making why or why not admission, treatments were needed:1} Final Clinical Impression(s) / ED Diagnoses Final diagnoses:  None    Rx / DC Orders ED Discharge Orders     None

## 2021-10-17 ENCOUNTER — Encounter (HOSPITAL_COMMUNITY): Payer: Self-pay | Admitting: *Deleted

## 2021-10-17 ENCOUNTER — Telehealth: Payer: Self-pay | Admitting: *Deleted

## 2021-10-17 ENCOUNTER — Other Ambulatory Visit: Payer: Self-pay

## 2021-10-17 LAB — CBC WITH DIFFERENTIAL/PLATELET
Abs Immature Granulocytes: 0.04 10*3/uL (ref 0.00–0.07)
Basophils Absolute: 0 10*3/uL (ref 0.0–0.1)
Basophils Relative: 0 %
Eosinophils Absolute: 0.1 10*3/uL (ref 0.0–0.5)
Eosinophils Relative: 1 %
HCT: 32 % — ABNORMAL LOW (ref 36.0–46.0)
Hemoglobin: 10.7 g/dL — ABNORMAL LOW (ref 12.0–15.0)
Immature Granulocytes: 0 %
Lymphocytes Relative: 12 %
Lymphs Abs: 1.1 10*3/uL (ref 0.7–4.0)
MCH: 29.9 pg (ref 26.0–34.0)
MCHC: 33.4 g/dL (ref 30.0–36.0)
MCV: 89.4 fL (ref 80.0–100.0)
Monocytes Absolute: 1.1 10*3/uL — ABNORMAL HIGH (ref 0.1–1.0)
Monocytes Relative: 12 %
Neutro Abs: 7.3 10*3/uL (ref 1.7–7.7)
Neutrophils Relative %: 75 %
Platelets: 242 10*3/uL (ref 150–400)
RBC: 3.58 MIL/uL — ABNORMAL LOW (ref 3.87–5.11)
RDW: 16.4 % — ABNORMAL HIGH (ref 11.5–15.5)
WBC: 9.7 10*3/uL (ref 4.0–10.5)
nRBC: 0 % (ref 0.0–0.2)

## 2021-10-17 LAB — URINALYSIS, ROUTINE W REFLEX MICROSCOPIC
Bilirubin Urine: NEGATIVE
Glucose, UA: NEGATIVE mg/dL
Ketones, ur: NEGATIVE mg/dL
Nitrite: NEGATIVE
Protein, ur: 100 mg/dL — AB
Specific Gravity, Urine: 1.014 (ref 1.005–1.030)
WBC, UA: >50 WBC/hpf — ABNORMAL HIGH (ref 0–5)
pH: 6 (ref 5.0–8.0)

## 2021-10-17 LAB — BASIC METABOLIC PANEL
Anion gap: 7 (ref 5–15)
BUN: 26 mg/dL — ABNORMAL HIGH (ref 8–23)
CO2: 25 mmol/L (ref 22–32)
Calcium: 8.7 mg/dL — ABNORMAL LOW (ref 8.9–10.3)
Chloride: 106 mmol/L (ref 98–111)
Creatinine, Ser: 1.08 mg/dL — ABNORMAL HIGH (ref 0.44–1.00)
GFR, Estimated: 47 mL/min — ABNORMAL LOW (ref >60)
Glucose, Bld: 130 mg/dL — ABNORMAL HIGH (ref 70–99)
Potassium: 3.3 mmol/L — ABNORMAL LOW (ref 3.5–5.1)
Sodium: 138 mmol/L (ref 135–145)

## 2021-10-17 LAB — CBG MONITORING, ED
Glucose-Capillary: 136 mg/dL — ABNORMAL HIGH (ref 70–99)
Glucose-Capillary: 142 mg/dL — ABNORMAL HIGH (ref 70–99)

## 2021-10-17 MED ORDER — FOSFOMYCIN TROMETHAMINE 3 G PO PACK
3.0000 g | PACK | Freq: Once | ORAL | Status: AC
Start: 2021-10-17 — End: 2021-10-17
  Administered 2021-10-17: 3 g via ORAL
  Filled 2021-10-17: qty 3

## 2021-10-17 NOTE — Telephone Encounter (Signed)
Pt son called regarding antibiotic mom was prescribed for UTI.  Pt son states mom was "still running a fever" and he wasn't sure how long it took the medicine to kick in.  RNCM reviewed chart to find that pt was AFEBRILE (98.5) at discharge and had antibiotic onboard.  RNCM relayed information to pt and advised of moderate grade fever range on 100.4-102.2 degrees and high grade being over 102.2 degrees.  Advised to contact PCP for ED follow up should UTI symptoms persist.

## 2021-10-17 NOTE — ED Notes (Signed)
Pt drank all of ABT in water. Will watch for reaction and then D/C

## 2021-10-17 NOTE — ED Notes (Signed)
Pt had large bm and large amount of urine in her pamper

## 2021-10-17 NOTE — ED Notes (Signed)
The pt appears more alert than when she first arrived  opens her eyes when someone comes in the door

## 2021-10-19 LAB — URINE CULTURE: Culture: >=100000 — AB

## 2021-10-20 ENCOUNTER — Telehealth: Payer: Self-pay

## 2021-10-20 ENCOUNTER — Telehealth: Payer: Self-pay | Admitting: *Deleted

## 2021-10-20 NOTE — Telephone Encounter (Signed)
        Patient  visited [facility] on 10/17/2021  for altered mental status   Telephone encounter attempt :  1st outreach Follow up ED call  A HIPAA compliant voice message was left requesting a return call.  Instructed patient to call back at earliest convenience.   [facility], Care Management  [phone] [address], [place], [place] [address] Phone: [phone] Email: [email or web address]

## 2021-10-20 NOTE — Telephone Encounter (Signed)
Post ED Visit - Positive Culture Follow-up  Culture report reviewed by antimicrobial stewardship pharmacist: [facility] '[]'$  Elenor Quinones, Pharm.D. '[]'$  Heide Guile, Pharm.D., BCPS AQ-ID '[]'$  Parks Neptune, Pharm.D., BCPS '[]'$  Alycia Rossetti, Pharm.D., BCPS '[]'$  [place], Pharm.D., BCPS, AAHIVP '[]'$  Legrand Como, Pharm.D., BCPS, AAHIVP '[]'$  Salome Arnt, PharmD, BCPS '[]'$  Johnnette Gourd, PharmD, BCPS '[]'$  Hughes Better, PharmD, BCPS '[]'$  Leeroy Cha, PharmD '[]'$  Laqueta Linden, PharmD, BCPS '[]'$  Albertina Parr, PharmD  [facility] Team '[]'$  Leodis Sias, PharmD '[]'$  Lindell Spar, PharmD '[]'$  Royetta Asal, PharmD '[]'$  Graylin Shiver, Rph '[]'$  Rema Fendt) Glennon Mac, PharmD '[]'$  Arlyn Dunning, PharmD '[]'$  Netta Cedars, PharmD '[]'$  Dia Sitter, PharmD '[]'$  Leone Haven, PharmD '[]'$  Gretta Arab, PharmD '[]'$  Theodis Shove, PharmD '[]'$  Peggyann Juba, PharmD '[]'$  Reuel Boom, PharmD   Positive urine culture Treated with Fosfomycin, organism sensitive to the same and no further patient follow-up is required at this time.Billey Gosling, PharmD  Harlon Flor Talley 10/20/2021, 1:45 PM

## 2021-10-21 ENCOUNTER — Telehealth: Payer: Self-pay

## 2021-10-21 NOTE — Telephone Encounter (Signed)
        Patient  visited [facility] on 10/17/2021  for  Altered Mental Status   Telephone encounter attempt :    A HIPAA compliant voice message was left requesting a return call.  Instructed patient to call back at earliest convenience. [facility], Care Management  [phone] [address], [place], [place] [address] Phone: [phone] Email: [email or web address]

## 2021-10-24 DIAGNOSIS — N390 Urinary tract infection, site not specified: Secondary | ICD-10-CM | POA: Diagnosis not present

## 2021-10-25 ENCOUNTER — Telehealth: Payer: Self-pay

## 2021-10-25 NOTE — Telephone Encounter (Signed)
     Patient  visit on 10/17/2021  at [facility] was   Have you been able to follow up with your primary care physician? yes  The patient was or was not able to obtain any needed medicine or equipment. yes  Are there diet recommendations that you are having difficulty following?na  Patient expresses understanding of discharge instructions and education provided has no other needs at this time. yes     [facility] Management  [phone] [address], [place], [place] [address] Phone: [phone] Email: [email or web address]

## 2021-10-27 DIAGNOSIS — M25552 Pain in left hip: Secondary | ICD-10-CM | POA: Diagnosis not present

## 2021-10-27 DIAGNOSIS — S42032S Displaced fracture of lateral end of left clavicle, sequela: Secondary | ICD-10-CM | POA: Diagnosis not present

## 2021-10-31 DIAGNOSIS — R350 Frequency of micturition: Secondary | ICD-10-CM | POA: Diagnosis not present

## 2021-11-04 DIAGNOSIS — R338 Other retention of urine: Secondary | ICD-10-CM | POA: Diagnosis not present

## 2021-11-04 DIAGNOSIS — N3946 Mixed incontinence: Secondary | ICD-10-CM | POA: Diagnosis not present

## 2021-11-09 ENCOUNTER — Ambulatory Visit: Payer: Medicare PPO | Admitting: Podiatry

## 2021-11-09 ENCOUNTER — Encounter: Payer: Self-pay | Admitting: Podiatry

## 2021-11-09 DIAGNOSIS — M79675 Pain in left toe(s): Secondary | ICD-10-CM

## 2021-11-09 DIAGNOSIS — B351 Tinea unguium: Secondary | ICD-10-CM | POA: Diagnosis not present

## 2021-11-09 DIAGNOSIS — L988 Other specified disorders of the skin and subcutaneous tissue: Secondary | ICD-10-CM | POA: Diagnosis not present

## 2021-11-09 DIAGNOSIS — L84 Corns and callosities: Secondary | ICD-10-CM | POA: Diagnosis not present

## 2021-11-09 DIAGNOSIS — M79674 Pain in right toe(s): Secondary | ICD-10-CM | POA: Diagnosis not present

## 2021-11-09 DIAGNOSIS — E1169 Type 2 diabetes mellitus with other specified complication: Secondary | ICD-10-CM

## 2021-11-09 DIAGNOSIS — F39 Unspecified mood [affective] disorder: Secondary | ICD-10-CM | POA: Insufficient documentation

## 2021-11-14 DIAGNOSIS — N390 Urinary tract infection, site not specified: Secondary | ICD-10-CM | POA: Diagnosis not present

## 2021-11-16 NOTE — Progress Notes (Signed)
Subjective:  Patient ID: Brenda Lynch, female    DOB: 12/07/1923,  MRN: [id number]  88 y.o. female presents with preventative diabetic foot care and corn(s) left lower extremity and painful thick toenails that are difficult to trim. Painful toenails interfere with ambulation. Aggravating factors include wearing enclosed shoe gear. Pain is relieved with periodic professional debridement. Painful corns are aggravated when weightbearing when wearing enclosed shoe gear. Pain is relieved with periodic professional debridement..    She is accompanied by her son on today's visit. Son states he has purchased shoes with soft uppers and his mother doesn't particularly like them. She would like to wear her leather dress shoes to church. He would like me to explain the reason she cannot wear her leather shoes to her today.  Son is also concerned about raw area on the bottom of her left 3rd toe.   Last known  HgA1c was 6.1%.  Patient does not monitor blood glucose daily.  New problem(s): None    PCP: [name], MD and last visit was: July, 2023.  Review of Systems: Negative except as noted in the HPI.   Allergies  Allergen Reactions   Ciprofloxacin Nausea Only and Other (See Comments)    GI intolerance  Other reaction(s): GI intol   Doxycycline Hyclate Other (See Comments)    BS elev, Head pressure    Penicillins Hives and Other (See Comments)    Welts, also Has patient had a PCN reaction causing immediate rash, facial/tongue/throat swelling, SOB or lightheadedness with hypotension: Yes Has patient had a PCN reaction causing severe rash involving mucus membranes or skin necrosis: No Has patient had a PCN reaction that required hospitalization No Has patient had a PCN reaction occurring within the last 10 years: No If all of the above answers are "NO", then may proceed with Cephalosporin use.   Penicillin G Hives and Other (See Comments)    Welts on the body, also     Objective:   There were no vitals filed for this visit. Constitutional Patient is a pleasant 88 y.o. African American female thin build in NAD. AAO x 3.  Vascular Capillary fill time to digits immediate b/l.  DP/PT pulse(s) are palpable b/l lower extremities. Pedal hair absent. Lower extremity skin temperature gradient within normal limits. No pain with calf compression b/l. No edema noted b/l lower extremities. No cyanosis or clubbing noted.   Neurologic Protective sensation intact 5/5 intact bilaterally with 10g monofilament b/l. Vibratory sensation intact b/l. No clonus b/l.   Dermatologic Toenails 1-5 b/l elongated, discolored, dystrophic, thickened, crumbly with subungual debris and tenderness to dorsal palpation. Pedal skin thin, shiny and atrophic b/l LE. Area of maceration noted plantarly at sulcus of left 3rd toe from daily silicone toe tunnel use. No erythema, no edema, no drainage, no fluctucane. Hyperkeratotic lesion(s) dorsal PIPJ of L 4th toe.  No erythema, no edema, no drainage, no fluctuance.  Orthopedic: Muscle strength 4/5 to all lower extremity muscle groups bilaterally. Hammertoe deformity noted 2-5 b/l. Utilizes cane for ambulation assistance.    Assessment:   1. Pain due to onychomycosis of toenails of both feet   2. Corns   3. Type 2 diabetes mellitus with hyperlipidemia ([place])    Plan:  Patient was evaluated and treated and all questions answered. Consent given for treatment as described below: -Patient's family member present. All questions/concerns addressed on today's visit. -Examined patient. -Explained to Ms. Wiler leather shoes would contribute to wound formation of her toes. She related  understanding. -Mycotic toenails 1-5 bilaterally were debrided in length and girth with sterile nail nippers and dremel without incident. -Corn(s) L 4th toe pared utilizing sterile scalpel blade without complication or incident. Total number debrided=1. Continue shoes with stretchable uppers  daily. -For macerated area left 3rd toe, recommended padding with fabric band-aid and apply silicone toe tunnel. Call office if condition worsens. -Patient/POA to call should there be question/concern in the interim.  Return in about 3 months (around 02/09/2022).  Marzetta Board, DPM

## 2021-11-17 DIAGNOSIS — S42032D Displaced fracture of lateral end of left clavicle, subsequent encounter for fracture with routine healing: Secondary | ICD-10-CM | POA: Diagnosis not present

## 2021-11-30 DIAGNOSIS — I10 Essential (primary) hypertension: Secondary | ICD-10-CM | POA: Diagnosis not present

## 2021-11-30 DIAGNOSIS — H8110 Benign paroxysmal vertigo, unspecified ear: Secondary | ICD-10-CM | POA: Diagnosis not present

## 2021-11-30 DIAGNOSIS — Z23 Encounter for immunization: Secondary | ICD-10-CM | POA: Diagnosis not present

## 2021-11-30 DIAGNOSIS — M48061 Spinal stenosis, lumbar region without neurogenic claudication: Secondary | ICD-10-CM | POA: Diagnosis not present

## 2021-11-30 DIAGNOSIS — E1122 Type 2 diabetes mellitus with diabetic chronic kidney disease: Secondary | ICD-10-CM | POA: Diagnosis not present

## 2021-11-30 DIAGNOSIS — I5030 Unspecified diastolic (congestive) heart failure: Secondary | ICD-10-CM | POA: Diagnosis not present

## 2021-11-30 DIAGNOSIS — E7800 Pure hypercholesterolemia, unspecified: Secondary | ICD-10-CM | POA: Diagnosis not present

## 2021-11-30 DIAGNOSIS — E46 Unspecified protein-calorie malnutrition: Secondary | ICD-10-CM | POA: Diagnosis not present

## 2021-11-30 DIAGNOSIS — E1142 Type 2 diabetes mellitus with diabetic polyneuropathy: Secondary | ICD-10-CM | POA: Diagnosis not present

## 2021-12-02 DIAGNOSIS — N130 Hydronephrosis with ureteropelvic junction obstruction: Secondary | ICD-10-CM | POA: Diagnosis not present

## 2021-12-06 ENCOUNTER — Ambulatory Visit: Payer: Medicare PPO | Admitting: Podiatry

## 2022-01-12 ENCOUNTER — Telehealth: Payer: Self-pay | Admitting: *Deleted

## 2022-01-12 ENCOUNTER — Other Ambulatory Visit: Payer: Self-pay

## 2022-01-12 ENCOUNTER — Encounter (HOSPITAL_COMMUNITY): Payer: Self-pay | Admitting: Emergency Medicine

## 2022-01-12 ENCOUNTER — Emergency Department (HOSPITAL_COMMUNITY)
Admission: EM | Admit: 2022-01-12 | Discharge: 2022-01-12 | Discharge status: Against Medical Advice | Payer: Medicare PPO | Attending: Emergency Medicine | Admitting: Emergency Medicine

## 2022-01-12 DIAGNOSIS — Z5321 Procedure and treatment not carried out due to patient leaving prior to being seen by health care provider: Secondary | ICD-10-CM | POA: Insufficient documentation

## 2022-01-12 DIAGNOSIS — M79671 Pain in right foot: Secondary | ICD-10-CM | POA: Diagnosis not present

## 2022-01-12 DIAGNOSIS — M25879 Other specified joint disorders, unspecified ankle and foot: Secondary | ICD-10-CM | POA: Diagnosis not present

## 2022-01-12 DIAGNOSIS — M79672 Pain in left foot: Secondary | ICD-10-CM | POA: Diagnosis not present

## 2022-01-12 NOTE — Telephone Encounter (Signed)
Patient's son is calling because has noticed that one week ago,her foot is turning black ,dry on top, denies any pain, walking fine.He has been using barrier cream but is not helping. Please advise.

## 2022-01-12 NOTE — ED Provider Triage Note (Signed)
Emergency Medicine Provider Triage Evaluation Note  Brenda Lynch , a 88 y.o. female  was evaluated in triage.  Pt complains of rash to bilateral dorsal aspect of feet x2 weeks.  Pruritic, using barrier cream with no improvement.  Denies any fevers or pain..  Review of Systems  Per HPI  Physical Exam  BP (!) 180/96 (BP Location: Right Arm)   Pulse 73   Temp 98.6 F (37 C)   Resp 18   SpO2 100%  Gen:   Awake, no distress  Resp:  Normal effort  MSK:   Moves extremities without difficulty  Other:  DP PT palpable 1+ bilaterally.  No tenderness to palpation, no crepitus.  Medical Decision Making  Medically screening exam initiated at 2:27 PM.  Appropriate orders placed.  Brenda Lynch was informed that the remainder of the evaluation will be completed by another provider, this initial triage assessment does not replace that evaluation, and the importance of remaining in the ED until their evaluation is complete.        Sherrill Raring, PA-C 01/12/22 1428

## 2022-01-12 NOTE — ED Notes (Signed)
X2 for vitals recheck with no response °

## 2022-01-12 NOTE — ED Triage Notes (Signed)
Pts Son reports pts skin at the base of her toes have discolored and "dried out." Son reports tried to see podiatrist and was told to come to ER. Pt denies pain.

## 2022-01-12 NOTE — Telephone Encounter (Signed)
Called and spoke with the son Josph Macho), giving recommendations per physician to go to ED, verbalized understanding and will take the patient today.

## 2022-01-17 DIAGNOSIS — L853 Xerosis cutis: Secondary | ICD-10-CM | POA: Diagnosis not present

## 2022-01-19 ENCOUNTER — Emergency Department (HOSPITAL_COMMUNITY)
Admission: EM | Admit: 2022-01-19 | Discharge: 2022-01-20 | Disposition: A | Discharge status: Home / Self Care | Payer: Medicare PPO | Attending: Emergency Medicine | Admitting: Emergency Medicine

## 2022-01-19 ENCOUNTER — Emergency Department (HOSPITAL_COMMUNITY): Payer: Medicare PPO

## 2022-01-19 DIAGNOSIS — I2510 Atherosclerotic heart disease of native coronary artery without angina pectoris: Secondary | ICD-10-CM | POA: Diagnosis not present

## 2022-01-19 DIAGNOSIS — Z79899 Other long term (current) drug therapy: Secondary | ICD-10-CM | POA: Diagnosis not present

## 2022-01-19 DIAGNOSIS — I672 Cerebral atherosclerosis: Secondary | ICD-10-CM | POA: Diagnosis not present

## 2022-01-19 DIAGNOSIS — I130 Hypertensive heart and chronic kidney disease with heart failure and stage 1 through stage 4 chronic kidney disease, or unspecified chronic kidney disease: Secondary | ICD-10-CM | POA: Diagnosis not present

## 2022-01-19 DIAGNOSIS — U071 COVID-19: Secondary | ICD-10-CM | POA: Diagnosis not present

## 2022-01-19 DIAGNOSIS — D329 Benign neoplasm of meninges, unspecified: Secondary | ICD-10-CM | POA: Diagnosis not present

## 2022-01-19 DIAGNOSIS — R29810 Facial weakness: Secondary | ICD-10-CM | POA: Diagnosis not present

## 2022-01-19 DIAGNOSIS — Z7982 Long term (current) use of aspirin: Secondary | ICD-10-CM | POA: Insufficient documentation

## 2022-01-19 DIAGNOSIS — N183 Chronic kidney disease, stage 3 unspecified: Secondary | ICD-10-CM | POA: Diagnosis not present

## 2022-01-19 DIAGNOSIS — M6281 Muscle weakness (generalized): Secondary | ICD-10-CM | POA: Diagnosis not present

## 2022-01-19 DIAGNOSIS — R29818 Other symptoms and signs involving the nervous system: Secondary | ICD-10-CM | POA: Diagnosis not present

## 2022-01-19 DIAGNOSIS — A419 Sepsis, unspecified organism: Secondary | ICD-10-CM | POA: Diagnosis not present

## 2022-01-19 DIAGNOSIS — I509 Heart failure, unspecified: Secondary | ICD-10-CM | POA: Insufficient documentation

## 2022-01-19 DIAGNOSIS — I959 Hypotension, unspecified: Secondary | ICD-10-CM | POA: Diagnosis not present

## 2022-01-19 DIAGNOSIS — I10 Essential (primary) hypertension: Secondary | ICD-10-CM | POA: Diagnosis not present

## 2022-01-19 DIAGNOSIS — E1122 Type 2 diabetes mellitus with diabetic chronic kidney disease: Secondary | ICD-10-CM | POA: Diagnosis not present

## 2022-01-19 DIAGNOSIS — G459 Transient cerebral ischemic attack, unspecified: Secondary | ICD-10-CM | POA: Diagnosis not present

## 2022-01-19 LAB — I-STAT CHEM 8, ED
BUN: 17 mg/dL (ref 8–23)
Calcium, Ion: 1.14 mmol/L — ABNORMAL LOW (ref 1.15–1.40)
Chloride: 101 mmol/L (ref 98–111)
Creatinine, Ser: 1.1 mg/dL — ABNORMAL HIGH (ref 0.44–1.00)
Glucose, Bld: 125 mg/dL — ABNORMAL HIGH (ref 70–99)
HCT: 37 % (ref 36.0–46.0)
Hemoglobin: 12.6 g/dL (ref 12.0–15.0)
Potassium: 4.7 mmol/L (ref 3.5–5.1)
Sodium: 137 mmol/L (ref 135–145)
TCO2: 26 mmol/L (ref 22–32)

## 2022-01-19 LAB — COMPREHENSIVE METABOLIC PANEL
ALT: 17 U/L (ref 0–44)
AST: 24 U/L (ref 15–41)
Albumin: 3.6 g/dL (ref 3.5–5.0)
Alkaline Phosphatase: 77 U/L (ref 38–126)
Anion gap: 12 (ref 5–15)
BUN: 18 mg/dL (ref 8–23)
CO2: 23 mmol/L (ref 22–32)
Calcium: 9.4 mg/dL (ref 8.9–10.3)
Chloride: 104 mmol/L (ref 98–111)
Creatinine, Ser: 1.18 mg/dL — ABNORMAL HIGH (ref 0.44–1.00)
GFR, Estimated: 42 mL/min — ABNORMAL LOW (ref >60)
Glucose, Bld: 130 mg/dL — ABNORMAL HIGH (ref 70–99)
Potassium: 4.7 mmol/L (ref 3.5–5.1)
Sodium: 139 mmol/L (ref 135–145)
Total Bilirubin: 0.6 mg/dL (ref 0.3–1.2)
Total Protein: 7.1 g/dL (ref 6.5–8.1)

## 2022-01-19 LAB — CBC
HCT: 35.8 % — ABNORMAL LOW (ref 36.0–46.0)
Hemoglobin: 11.7 g/dL — ABNORMAL LOW (ref 12.0–15.0)
MCH: 31.1 pg (ref 26.0–34.0)
MCHC: 32.7 g/dL (ref 30.0–36.0)
MCV: 95.2 fL (ref 80.0–100.0)
Platelets: 224 10*3/uL (ref 150–400)
RBC: 3.76 MIL/uL — ABNORMAL LOW (ref 3.87–5.11)
RDW: 15 % (ref 11.5–15.5)
WBC: 10 10*3/uL (ref 4.0–10.5)
nRBC: 0 % (ref 0.0–0.2)

## 2022-01-19 LAB — DIFFERENTIAL
Abs Immature Granulocytes: 0.03 10*3/uL (ref 0.00–0.07)
Basophils Absolute: 0 10*3/uL (ref 0.0–0.1)
Basophils Relative: 0 %
Eosinophils Absolute: 0 10*3/uL (ref 0.0–0.5)
Eosinophils Relative: 0 %
Immature Granulocytes: 0 %
Lymphocytes Relative: 7 %
Lymphs Abs: 0.7 10*3/uL (ref 0.7–4.0)
Monocytes Absolute: 1 10*3/uL (ref 0.1–1.0)
Monocytes Relative: 10 %
Neutro Abs: 8.2 10*3/uL — ABNORMAL HIGH (ref 1.7–7.7)
Neutrophils Relative %: 83 %

## 2022-01-19 LAB — PROTIME-INR
INR: 1 (ref 0.8–1.2)
Prothrombin Time: 13.3 seconds (ref 11.4–15.2)

## 2022-01-19 LAB — ETHANOL: Alcohol, Ethyl (B): <10 mg/dL (ref <10)

## 2022-01-19 LAB — APTT: aPTT: 26 seconds (ref 24–36)

## 2022-01-19 LAB — CBG MONITORING, ED: Glucose-Capillary: 143 mg/dL — ABNORMAL HIGH (ref 70–99)

## 2022-01-19 MED ORDER — SODIUM CHLORIDE 0.9 % IV BOLUS
500.0000 mL | Freq: Once | INTRAVENOUS | Status: AC
  Administered 2022-01-20: 500 mL via INTRAVENOUS

## 2022-01-19 MED ORDER — ACETAMINOPHEN 650 MG RE SUPP
650.0000 mg | Freq: Once | RECTAL | Status: AC
  Administered 2022-01-19: 650 mg via RECTAL
  Filled 2022-01-19: qty 1

## 2022-01-19 MED ORDER — SODIUM CHLORIDE 0.9 % IV SOLN
1.0000 g | Freq: Once | INTRAVENOUS | Status: AC
  Administered 2022-01-19: 1 g via INTRAVENOUS
  Filled 2022-01-19: qty 10

## 2022-01-19 NOTE — ED Triage Notes (Signed)
Pt arrived from home via [facility] w stoke symptoms left side weakness and left side facial droop. Last known well 01/19/2022 1530

## 2022-01-19 NOTE — ED Provider Notes (Signed)
[place] [facility] EMERGENCY DEPARTMENT Provider Note   CSN: [id number] Arrival date & time: 01/19/22  2148  An emergency department physician performed an initial assessment on this suspected stroke patient at 2148.  History  Chief Complaint  Patient presents with   Code Stroke    Brenda Lynch is a 88 y.o. female.  88 year old female brought in by EMS from home as code stroke for concern for left-sided weakness and facial droop with reported last known well of 330 today.  Patient was assessed by a neuro hospitalist on arrival, felt not to meet code stroke criteria and recommending medical work-up.  On discussion with son at bedside who is also POA, as well as caretaker (Linda)-son was with patient earlier today, states that sometime after 2:00 patient had finished her lunch and was having difficulty standing back up with her walker.  Eventually she was able to stand and ambulated to the bedroom although slower than usual.  Patient laid down to rest and when she woke up around 7 with a caretaker she did not seem like her usual self and was not talking to the caretaker per usual.  Patient arrives in the ER, will open eyes to voice, does not answer questions, does not follow commands.  Is found to be febrile.  Past medical history of coronary artery disease, hypertension, hyperlipidemia, vertigo, diabetes, CHF, stage III CKD, hyponatremia.  With DNR and MOST form.        Home Medications Prior to Admission medications   Medication Sig Start Date End Date Taking? Authorizing Provider  molnupiravir EUA (LAGEVRIO) 200 MG CAPS capsule Take 4 capsules (800 mg total) by mouth 2 (two) times daily for 5 days. 01/20/22 01/25/22 Yes Tacy Learn, PA-C  acetaminophen (TYLENOL) 500 MG tablet Take 500 mg by mouth every 6 (six) hours as needed (for headaches or mild pain).    [provider]  ALPRAZolam Duanne Moron) 0.25 MG tablet Take 1 tablet by mouth 2 (two) times daily as  needed.    [provider]  aspirin 325 MG tablet Take 1 tablet (325 mg total) by mouth daily. Patient not taking: Reported on 08/29/2021 06/12/20   Hosie Poisson, MD  atorvastatin (LIPITOR) 20 MG tablet Take 1 tablet by mouth daily.    [provider]  calcium carbonate (TUMS - DOSED IN MG ELEMENTAL CALCIUM) 500 MG chewable tablet Chew 1 tablet by mouth daily as needed for indigestion or heartburn.    [provider]  cephALEXin (KEFLEX) 500 MG capsule Take 500 mg by mouth 2 (two) times daily. 11/01/21   [provider]  Cholecalciferol (VITAMIN D3) 25 MCG (1000 UT) CAPS Take 1,000 Units by mouth in the morning.    [provider]  clopidogrel (PLAVIX) 75 MG tablet Take 1 tablet by mouth daily.    [provider]  fluticasone (FLONASE) 50 MCG/ACT nasal spray Place 1-2 sprays into both nostrils daily as needed for allergies or rhinitis.    [provider]  hydrochlorothiazide (MICROZIDE) 12.5 MG capsule TAKE ONE CAPSULE ONCE DAILY Patient taking differently: Take 12.5 mg by mouth daily. 05/17/21   Sueanne Margarita, MD  hydrOXYzine (ATARAX) 25 MG tablet Take 25 mg by mouth at bedtime as needed for anxiety.    [provider]  loperamide (IMODIUM A-D) 2 MG tablet Take 2 mg by mouth 4 (four) times daily as needed for diarrhea or loose stools.    [provider]  Melatonin 10 MG TABS  Take 10 mg by mouth at bedtime as needed (for sleep).    [provider]  Misc Natural Products (OSTEO BI-FLEX ADV DOUBLE ST PO) Take 1 tablet by mouth daily.    [provider]  montelukast (SINGULAIR) 10 MG tablet Take 10 mg by mouth at bedtime. 12/12/12   [provider]  ondansetron (ZOFRAN) 4 MG tablet Take 4 mg by mouth daily as needed for nausea or vomiting. 09/27/20   [provider]  Polyethyl Glyc-Propyl Glyc PF (SYSTANE HYDRATION PF) 0.4-0.3 % SOLN Place 1 drop into both eyes 3 (three) times daily as  needed (for irritation).    [provider]  polyethylene glycol powder (GLYCOLAX/MIRALAX) 17 GM/SCOOP powder Take 8.5-17 g by mouth daily as needed for mild constipation (MIX AND DRINK).    [provider]  potassium chloride (MICRO-K) 10 MEQ CR capsule Take 10 mEq by mouth daily.    [provider]  PRILOSEC OTC 20 MG tablet Take 20 mg by mouth daily before breakfast.    [provider]  ramipril (ALTACE) 10 MG capsule Take 10 mg by mouth daily.    [provider]  Saline (SIMPLY SALINE) 0.9 % AERS Place 1 spray into both nostrils as needed (for congestion).    [provider]  traZODone (DESYREL) 50 MG tablet Take 25 mg by mouth at bedtime as needed for sleep. 10/09/20   [provider]      Allergies    Ciprofloxacin, Doxycycline hyclate, Penicillins, and Penicillin g    Review of Systems   Review of Systems Level 5 caveat for dementia  Physical Exam Updated Vital Signs BP (!) 154/76   Pulse 68   Temp 98 F (36.7 C) (Axillary)   Resp 15   SpO2 99%  Physical Exam Vitals and nursing note reviewed.  Constitutional:      General: She is not in acute distress.    Appearance: She is well-developed. She is not diaphoretic.     Comments: Opens eyes to name  HENT:     Head: Normocephalic and atraumatic.     Mouth/Throat:     Mouth: Mucous membranes are dry.  Eyes:     Pupils: Pupils are equal, round, and reactive to light.  Cardiovascular:     Rate and Rhythm: Normal rate and regular rhythm.     Heart sounds: Normal heart sounds.  Pulmonary:     Effort: Pulmonary effort is normal.     Breath sounds: Normal breath sounds.  Abdominal:     Palpations: Abdomen is soft.     Tenderness: There is no abdominal tenderness.  Musculoskeletal:     Cervical back: Neck supple.     Right lower leg: No edema.     Left lower leg: No edema.  Skin:    General: Skin is warm and dry.     Findings: No erythema or rash.   Neurological:     Comments: Opens eyes to voice, does not follow commands      ED Results / Procedures / Treatments   Labs (all labs ordered are listed, but only abnormal results are displayed) Labs Reviewed  RESP PANEL BY RT-PCR (FLU A&B, COVID) ARPGX2 - Abnormal; Notable for the following components:      Result Value   SARS Coronavirus 2 by RT PCR POSITIVE (*)    All other components within normal limits  CBC - Abnormal; Notable for the following components:   RBC 3.76 (*)  Hemoglobin 11.7 (*)    HCT 35.8 (*)    All other components within normal limits  DIFFERENTIAL - Abnormal; Notable for the following components:   Neutro Abs 8.2 (*)    All other components within normal limits  COMPREHENSIVE METABOLIC PANEL - Abnormal; Notable for the following components:   Glucose, Bld 130 (*)    Creatinine, Ser 1.18 (*)    GFR, Estimated 42 (*)    All other components within normal limits  CBG MONITORING, ED - Abnormal; Notable for the following components:   Glucose-Capillary 143 (*)    All other components within normal limits  I-STAT CHEM 8, ED - Abnormal; Notable for the following components:   Creatinine, Ser 1.10 (*)    Glucose, Bld 125 (*)    Calcium, Ion 1.14 (*)    All other components within normal limits  CULTURE, BLOOD (ROUTINE X 2)  CULTURE, BLOOD (ROUTINE X 2)  URINE CULTURE  ETHANOL  PROTIME-INR  APTT  RAPID URINE DRUG SCREEN, HOSP PERFORMED  URINALYSIS, ROUTINE W REFLEX MICROSCOPIC  LACTIC ACID, PLASMA    EKG EKG Interpretation  Date/Time:  Thursday January 19 2022 22:56:46 EDT Ventricular Rate:  74 PR Interval:  192 QRS Duration: 98 QT Interval:  408 QTC Calculation: 453 R Axis:   79 Text Interpretation: Sinus rhythm Consider RVH w/ secondary repol abnormality Repol abnrm suggests ischemia, lateral leads No acute changes Confirmed by Addison Lank [phone]) on 01/20/2022 1:23:49 AM  Radiology DG Chest Port 1 View  Result Date:  01/19/2022 CLINICAL DATA:  Sepsis EXAM: PORTABLE CHEST 1 VIEW COMPARISON:  10/16/2021 FINDINGS: Lungs are clear. No pneumothorax or pleural effusion. Cardiac size within normal limits. Ectasia of the thoracic aorta is stable. Pulmonary vascularity is normal. No acute bone abnormality. IMPRESSION: 1. No active disease. Electronically Signed   By: Fidela Salisbury M.D.   On: 01/19/2022 22:50   CT HEAD CODE STROKE WO CONTRAST  Result Date: 01/19/2022 CLINICAL DATA:  Code stroke. Initial evaluation for neuro deficit, stroke suspected. EXAM: CT HEAD WITHOUT CONTRAST TECHNIQUE: Contiguous axial images were obtained from the base of the skull through the vertex without intravenous contrast. RADIATION DOSE REDUCTION: This exam was performed according to the departmental dose-optimization program which includes automated exposure control, adjustment of the mA and/or kV according to patient size and/or use of iterative reconstruction technique. COMPARISON:  CT from 10/08/2021. FINDINGS: Brain: Examination technically limited by positioning. Atrophy with chronic small vessel ischemic disease. No acute intracranial hemorrhage. No acute large vessel territory infarct. 1.6 cm densely calcified meningioma along the anterior falx without edema or mass effect. No hydrocephalus or extra-axial fluid collection. Vascular: No abnormal hyperdense vessel. Calcified atherosclerosis present at the skull base. Skull: Scalp soft tissues and calvarium demonstrate no acute finding. Sinuses/Orbits: Globes orbital soft tissues within normal limits. Other: None. ASPECTS [facility] Stroke Program Early CT Score) - Ganglionic level infarction (caudate, lentiform nuclei, internal capsule, insula, M1-M3 cortex): 7 - Supraganglionic infarction (M4-M6 cortex): 30 Total score (0-10 with 10 being normal): 10 IMPRESSION: 1. No acute intracranial abnormality. 2. ASPECTS is 10. 3. Atrophy with chronic small vessel ischemic disease. 4. 1.6 cm parafalcine  meningioma without edema or mass effect. These results were communicated to Dr. Rory Percy at 10:12 pm on 01/19/2022 by text page via the [facility] messaging system. Electronically Signed   By: Jeannine Boga M.D.   On: 01/19/2022 22:14    Procedures .Critical Care  Performed by: Tacy Learn, PA-C Authorized by: Suella Broad  A, PA-C   Critical care provider statement:    Critical care time (minutes):  30   Critical care was time spent personally by me on the following activities:  Development of treatment plan with patient or surrogate, discussions with consultants, evaluation of patient's response to treatment, examination of patient, ordering and review of laboratory studies, ordering and review of radiographic studies, ordering and performing treatments and interventions, pulse oximetry, re-evaluation of patient's condition and review of old charts     Medications Ordered in ED Medications  acetaminophen (TYLENOL) suppository 650 mg (650 mg Rectal Given 01/19/22 2332)  cefTRIAXone (ROCEPHIN) 1 g in sodium chloride 0.9 % 100 mL IVPB (0 g Intravenous Stopped 01/20/22 0021)  sodium chloride 0.9 % bolus 500 mL (0 mLs Intravenous Stopped 01/20/22 0034)    ED Course/ Medical Decision Making/ A&P                            Medical Decision Making Amount and/or Complexity of Data Reviewed Labs: ordered. Radiology: ordered.  Risk OTC drugs. Prescription drug management.   This patient presents to the ED for concern of weakness, altered mental status, this involves an extensive number of treatment options, and is a complaint that carries with it a high risk of complications and morbidity.  The differential diagnosis includes but not limited to CVA, sepsis, dehydration, metabolic disturbance    Co morbidities that complicate the patient evaluation  Past medical history of coronary artery disease, hypertension, hyperlipidemia, vertigo, diabetes, CHF, stage III CKD,  hyponatremia.   Additional history obtained:  Additional history obtained from son/POA at bedside as well as caretaker at bedside who contribute to history as above.  Reviewed MOST form and DNR with patient's son.  MOST form is marked for no antibiotics, no IV fluids.  Patient's son would like her to receive antibiotics and IV fluids today.  He agrees that he would not want any form of CPR to include chest compressions, intubation External records from outside source obtained and reviewed including echo dated 06/10/2020 with EF of 65 to 70%   Lab Tests:  I Ordered, and personally interpreted labs.  The pertinent results include: CBC with normal white count, mild anemia without significant change from prior.  Slight increase in neutrophils on differential.  CMP with baseline renal function with creatinine of 1.18, otherwise unremarkable.  Alcohol UDS negative.  Urinalysis is unremarkable.  Lactic acid 1.5.  Flu negative, COVID-positive.   Imaging Studies ordered:  I ordered imaging studies including CT head, chest x-ray I independently visualized and interpreted imaging which showed chest x-ray, no acute disease.  CT head, no acute abnormality I agree with the radiologist interpretation   Cardiac Monitoring: / EKG:  The patient was maintained on a cardiac monitor.  I personally viewed and interpreted the cardiac monitored which showed an underlying rhythm of: Sinus rhythm, rate 74   Consultations Obtained:  I requested consultation with the neuro hospitalist, Dr. Malen Gauze,  and discussed lab and imaging findings as well as pertinent plan - they recommend: Consider admission to hospital for MRI unless symptoms otherwise explained.  Patient has tested positive for COVID, family would prefer to take patient home where she has round-the-clock care.   Problem List / ED Course / Critical interventions / Medication management  88 year old female brought in by EMS from home as above.  On  arrival, patient will open her eyes to her name otherwise, not following commands.  Abdomen is soft and nontender, no lower extremity edema, no evidence of injury.  Arrives as a code stroke via EMS, code stroke canceled after evaluation where the hospitalist and CT head complete.  She is found to be febrile, sepsis work-up initiated to include IV antibiotics and small fluid bolus.  Ultimately, labs reassuring although is positive for COVID.  Plan is to treat with oral antivirals.  Family would like to do transport patient home for care in the home. I ordered medication including Rocephin, IV fluids, Tylenol for concern for UTI/sepsis Reevaluation of the patient after these medicines showed that the patient improved, fever improved I have reviewed the patients home medicines and have made adjustments as needed   Social Determinants of Health:  Lives with son, has in-home caretaker.   Test / Admission - Considered:  Consider admission for generalized weakness, COVID-positive.  ER attending, Dr. Leonette Monarch has met with patient's son who prefers to take patient home, will give prescription for COVID antivirals with plan for son to call PCP for clarification on MOST form         Final Clinical Impression(s) / ED Diagnoses Final diagnoses:  COVID    Rx / DC Orders ED Discharge Orders          Ordered    molnupiravir EUA (LAGEVRIO) 200 MG CAPS capsule  2 times daily        01/20/22 0253              Tacy Learn, PA-C 01/20/22 9449    Malvin Johns, MD 01/20/22 1501

## 2022-01-19 NOTE — Consult Note (Signed)
Neurology Consultation  Reason for Consult: Stroke/TIA Referring Physician: Dr. Tamera Punt  CC: Left-sided weakness with resolution of symptoms on the way to the hospital  History is obtained from: EMS  HPI: Brenda Lynch is a 88 y.o. female past medical history of dementia, hypertension, hypercholesterolemia, meningioma, BPPV, CHF, CKD 2, coronary artery disease, diabetes, presenting from home for evaluation of sudden onset of left-sided weakness and unresponsiveness.  Last known well noted to be 1530 hrs. per EMS when it was noted in the evening that she was unresponsive and on EMS arrival had left face arm and leg weakness. I called the son Rod Tidd, but he said that his other brother was with her and should be coming to the hospital soon.  I was unable to reach him because his phone is not working at this time. On the way to the hospital, the patient started showing improvement-at the house for EMS, the patient was flaccid on the left upper and lower extremity with left facial droop but on the way to the hospital symptoms improved to the point that she was swatting the EMTs with her left hand with full strength.  They had initially called a code stroke and then canceled the code stroke but Dr. Tamera Punt and I evaluated the patient irrespective given the initial concern for strokelike symptoms. Patient was outside the window for IV thrombolysis.  Her baseline is extremely poor and she does not meet the criteria for endovascular thrombectomy even if this were to be a stroke.  Also noted to be febrile.   LKW: 3:30 PM IV thrombolysis given?: no, outside the window Premorbid modified Rankin scale (mRS): 4 ROS: Unable to obtain due to altered mental status.   Past Medical History:  Diagnosis Date   Allergic rhinitis    Chronic diastolic CHF (congestive heart failure) (HCC)    CKD (chronic kidney disease), stage II    Coronary artery disease    Diabetes mellitus    Diverticulosis    Drug-induced  bradycardia 01/28/2016   Hypercholesterolemia    Hypertension    Hyponatremia    Meningioma (HCC)    Osteoarthritis    Sedentary lifestyle    Spinal stenosis    Vertigo, benign positional      Family History  Problem Relation Age of Onset   Heart disease Mother    Heart attack Mother    Prostate cancer Father    Lung cancer Brother    Pancreatic cancer Brother    Headache Neg Hx      Social History:   reports that she quit smoking about 43 years ago. Her smoking use included cigarettes. She has never used smokeless tobacco. She reports that she does not drink alcohol and does not use drugs.  Medications  Current Facility-Administered Medications:    acetaminophen (TYLENOL) suppository 650 mg, 650 mg, Rectal, Once, Tacy Learn, PA-C  Current Outpatient Medications:    acetaminophen (TYLENOL) 500 MG tablet, Take 500 mg by mouth every 6 (six) hours as needed (for headaches or mild pain)., Disp: , Rfl:    ALPRAZolam (XANAX) 0.25 MG tablet, Take 1 tablet by mouth 2 (two) times daily as needed., Disp: , Rfl:    aspirin 325 MG tablet, Take 1 tablet (325 mg total) by mouth daily. (Patient not taking: Reported on 08/29/2021), Disp: 21 tablet, Rfl: 0   atorvastatin (LIPITOR) 20 MG tablet, Take 1 tablet by mouth daily., Disp: , Rfl:    calcium carbonate (TUMS - DOSED IN MG  ELEMENTAL CALCIUM) 500 MG chewable tablet, Chew 1 tablet by mouth daily as needed for indigestion or heartburn., Disp: , Rfl:    cephALEXin (KEFLEX) 500 MG capsule, Take 500 mg by mouth 2 (two) times daily., Disp: , Rfl:    Cholecalciferol (VITAMIN D3) 25 MCG (1000 UT) CAPS, Take 1,000 Units by mouth in the morning., Disp: , Rfl:    clopidogrel (PLAVIX) 75 MG tablet, Take 1 tablet by mouth daily., Disp: , Rfl:    fluticasone (FLONASE) 50 MCG/ACT nasal spray, Place 1-2 sprays into both nostrils daily as needed for allergies or rhinitis., Disp: , Rfl:    hydrochlorothiazide (MICROZIDE) 12.5 MG capsule, TAKE ONE  CAPSULE ONCE DAILY (Patient taking differently: Take 12.5 mg by mouth daily.), Disp: 30 capsule, Rfl: 11   hydrOXYzine (ATARAX) 25 MG tablet, Take 25 mg by mouth at bedtime as needed for anxiety., Disp: , Rfl:    loperamide (IMODIUM A-D) 2 MG tablet, Take 2 mg by mouth 4 (four) times daily as needed for diarrhea or loose stools., Disp: , Rfl:    Melatonin 10 MG TABS, Take 10 mg by mouth at bedtime as needed (for sleep)., Disp: , Rfl:    Misc Natural Products (OSTEO BI-FLEX ADV DOUBLE ST PO), Take 1 tablet by mouth daily., Disp: , Rfl:    montelukast (SINGULAIR) 10 MG tablet, Take 10 mg by mouth at bedtime., Disp: , Rfl:    ondansetron (ZOFRAN) 4 MG tablet, Take 4 mg by mouth daily as needed for nausea or vomiting., Disp: , Rfl:    Polyethyl Glyc-Propyl Glyc PF (SYSTANE HYDRATION PF) 0.4-0.3 % SOLN, Place 1 drop into both eyes 3 (three) times daily as needed (for irritation)., Disp: , Rfl:    polyethylene glycol powder (GLYCOLAX/MIRALAX) 17 GM/SCOOP powder, Take 8.5-17 g by mouth daily as needed for mild constipation (MIX AND DRINK)., Disp: , Rfl:    potassium chloride (MICRO-K) 10 MEQ CR capsule, Take 10 mEq by mouth daily., Disp: , Rfl:    PRILOSEC OTC 20 MG tablet, Take 20 mg by mouth daily before breakfast., Disp: , Rfl:    ramipril (ALTACE) 10 MG capsule, Take 10 mg by mouth daily., Disp: , Rfl:    Saline (SIMPLY SALINE) 0.9 % AERS, Place 1 spray into both nostrils as needed (for congestion)., Disp: , Rfl:    traZODone (DESYREL) 50 MG tablet, Take 25 mg by mouth at bedtime as needed for sleep., Disp: , Rfl:    Exam: Current vital signs: BP (!) 138/54   Pulse 76   Temp (!) 101.2 F (38.4 C) (Axillary)   Resp 16   SpO2 100%  Vital signs in last 24 hours: Temp:  [101.2 F (38.4 C)] 101.2 F (38.4 C) (11/02 2220) Pulse Rate:  [73-76] 76 (11/02 2220) Resp:  [16] 16 (11/02 2220) BP: (138)/(54) 138/54 (11/02 2220) SpO2:  [98 %-100 %] 100 % (11/02 2220) General: Awake alert in no  distress HEENT: Normocephalic atraumatic Lungs: Clear Cardiovascular: Regular rhythm Abdomen nondistended nontender Extremities warm well perfused Neurologic exam Patient appears awake alert, does not follow commands Speech is clear and spontaneous but she does not repeat, name or comprehend to command. Cranial nerves: Pupils appear equal round reactive light, she blink to threat from both sides, she does not participate with the examination to check for extraocular movements and seems to have somewhat of a preference to the right but that could just be the way she was positioned on the stretcher. Motor examination: She moves  all 4 extremities with full strength and antigravity but does not keep them against gravity for 10 and 5 seconds. Sensation: Intact-curses when being pinched on the leg and withdraws quite strongly. Coordination cannot be assessed given her mentation NIH stroke scale 1a Level of Conscious.: 0 1b LOC Questions: 2 1c LOC Commands: 2 2 Best Gaze: 0 3 Visual: 0 4 Facial Palsy: 0 5a Motor Arm - left: 1 5b Motor Arm - Right: 1 6a Motor Leg - Left: 1 6b Motor Leg - Right: 1 7 Limb Ataxia: 0 8 Sensory: 0 9 Best Language: 1 10 Dysarthria: 0 11 Extinct. and Inatten.: 0 TOTAL: 9   Labs I have reviewed labs in epic and the results pertinent to this consultation are:  CBC    Component Value Date/Time   WBC 10.0 01/19/2022 2151   RBC 3.76 (L) 01/19/2022 2151   HGB 12.6 01/19/2022 2155   HGB 13.6 02/17/2020 1119   HCT 37.0 01/19/2022 2155   HCT 39.5 02/17/2020 1119   PLT 224 01/19/2022 2151   PLT 265 02/17/2020 1119   MCV 95.2 01/19/2022 2151   MCV 96 02/17/2020 1119   MCH 31.1 01/19/2022 2151   MCHC 32.7 01/19/2022 2151   RDW 15.0 01/19/2022 2151   RDW 12.3 02/17/2020 1119   LYMPHSABS 0.7 01/19/2022 2151   LYMPHSABS 2.0 02/17/2020 1119   MONOABS 1.0 01/19/2022 2151   EOSABS 0.0 01/19/2022 2151   EOSABS 0.1 02/17/2020 1119   BASOSABS 0.0 01/19/2022 2151    BASOSABS 0.1 02/17/2020 1119    CMP     Component Value Date/Time   NA 137 01/19/2022 2155   NA 140 08/02/2021 1505   K 4.7 01/19/2022 2155   CL 101 01/19/2022 2155   CO2 23 01/19/2022 2151   GLUCOSE 125 (H) 01/19/2022 2155   BUN 17 01/19/2022 2155   BUN 25 08/02/2021 1505   CREATININE 1.10 (H) 01/19/2022 2155   CREATININE 0.95 (H) 01/28/2016 1033   CALCIUM 9.4 01/19/2022 2151   PROT 7.1 01/19/2022 2151   PROT 6.9 02/17/2020 1119   ALBUMIN 3.6 01/19/2022 2151   ALBUMIN 4.1 02/17/2020 1119   AST 24 01/19/2022 2151   ALT 17 01/19/2022 2151   ALKPHOS 77 01/19/2022 2151   BILITOT 0.6 01/19/2022 2151   BILITOT 0.3 02/17/2020 1119   GFRNONAA 42 (L) 01/19/2022 2151   GFRAA 54 (L) 02/17/2020 1119    Lipid Panel     Component Value Date/Time   CHOL 118 08/01/2020 0359   CHOL 207 (H) 05/16/2019 1351   TRIG 46 08/01/2020 0359   HDL 44 08/01/2020 0359   HDL 60 05/16/2019 1351   CHOLHDL 2.7 08/01/2020 0359   VLDL 9 08/01/2020 0359   LDLCALC 65 08/01/2020 0359   LDLCALC 135 (H) 05/16/2019 1351     Imaging I have reviewed the images obtained:  CT-head: No acute changes Emergent vessel imaging not performed due to patient not being a candidate for EVT   Assessment: 88 year old history of dementia hypertension hypercholesterolemia CHF CKD 2 coronary artery disease diabetes presenting for sudden onset of unresponsiveness and left-sided weakness with resolution of symptoms of the weakness on the way to the hospital.  Was noted to be febrile.  Family reports generalized weakness since this afternoon. Outside the window for thrombolysis.  Poor baseline precludes EVT. Stroke versus toxic metabolic encephalopathy remain in the differentials.  Recommendations: Check UA chest x-ray CBC BMP unremarkable I would also check B12 TSH. MRI of the brain  without contrast at some-point-stroke work-up only if it is positive for stroke. Management of fever-evaluation for source-per  primary team as you are.  Neurology will follow imaging studies and labs with you. Plan was discussed with Suella Broad, PA-C in the ER   -- Amie Portland, MD Neurologist [facility] Pager: [phone]

## 2022-01-20 ENCOUNTER — Encounter (HOSPITAL_COMMUNITY): Payer: Self-pay

## 2022-01-20 ENCOUNTER — Other Ambulatory Visit: Payer: Self-pay

## 2022-01-20 DIAGNOSIS — U071 COVID-19: Secondary | ICD-10-CM

## 2022-01-20 LAB — LACTIC ACID, PLASMA: Lactic Acid, Venous: 1.5 mmol/L (ref 0.5–1.9)

## 2022-01-20 LAB — RAPID URINE DRUG SCREEN, HOSP PERFORMED
Amphetamines: NOT DETECTED
Barbiturates: NOT DETECTED
Benzodiazepines: NOT DETECTED
Cocaine: NOT DETECTED
Opiates: NOT DETECTED
Tetrahydrocannabinol: NOT DETECTED

## 2022-01-20 LAB — URINALYSIS, ROUTINE W REFLEX MICROSCOPIC
Bacteria, UA: NONE SEEN
Bilirubin Urine: NEGATIVE
Glucose, UA: NEGATIVE mg/dL
Hgb urine dipstick: NEGATIVE
Ketones, ur: NEGATIVE mg/dL
Leukocytes,Ua: NEGATIVE
Nitrite: NEGATIVE
Protein, ur: NEGATIVE mg/dL
Specific Gravity, Urine: 1.01 (ref 1.005–1.030)
pH: 7 (ref 5.0–8.0)

## 2022-01-20 LAB — RESP PANEL BY RT-PCR (FLU A&B, COVID) ARPGX2
Influenza A by PCR: NEGATIVE
Influenza B by PCR: NEGATIVE
SARS Coronavirus 2 by RT PCR: POSITIVE — AB

## 2022-01-20 MED ORDER — MOLNUPIRAVIR 200 MG PO CAPS: 4.0000 | ORAL_CAPSULE | Freq: Two times a day (BID) | ORAL | 0 refills | Status: AC

## 2022-01-20 NOTE — Code Documentation (Signed)
Responded to Code Stroke called at 2117 for L sided facial droop, L sided weakness, and aphasia, [id number]-[id number]. Pt arrived at 2148 with resolution of symptoms. CBG-143, NIH-9, CT head negative for acute changes. TNK not given-outside window. Plan TIA alert and metabolic workup.

## 2022-01-20 NOTE — Discharge Instructions (Signed)
Molnupiravir for COVID, paper prescription provided so you can take this to a pharmacy that has this medication in stock.  Call PCP in the morning as discussed. Return to ER as needed.

## 2022-01-22 LAB — URINE CULTURE: Culture: NO GROWTH

## 2022-01-23 ENCOUNTER — Telehealth: Payer: Self-pay | Admitting: *Deleted

## 2022-01-23 NOTE — Telephone Encounter (Signed)
     Patient  visit on 01/20/2025  at [facility]  was for covid 19  Have you been able to follow up with your primary care physician? The patients son was very grateful for moms care and mom is better PCP has been contacted  The patient was able to obtain any needed medicine or equipment.  Are there diet recommendations that you are having difficulty following?  Patient expresses understanding of discharge instructions and education provided has no other needs at this time.    [facility] [phone] [address] , [place] [address] Email : [email or web address]

## 2022-01-25 LAB — CULTURE, BLOOD (ROUTINE X 2)
Culture: NO GROWTH
Culture: NO GROWTH
Special Requests: ADEQUATE

## 2022-02-03 ENCOUNTER — Telehealth: Payer: Self-pay | Admitting: *Deleted

## 2022-02-03 ENCOUNTER — Other Ambulatory Visit: Payer: Self-pay | Admitting: Gastroenterology

## 2022-02-03 DIAGNOSIS — R1310 Dysphagia, unspecified: Secondary | ICD-10-CM

## 2022-02-03 DIAGNOSIS — N390 Urinary tract infection, site not specified: Secondary | ICD-10-CM | POA: Diagnosis not present

## 2022-02-03 NOTE — Telephone Encounter (Signed)
Patient's son is calling to let the physician know that he had noticed blisters on certain toes, will be contacting pcp (Dr Deforest Hoyles) to make documentation and advise Dr Elisha Ponder.

## 2022-02-06 ENCOUNTER — Ambulatory Visit: Payer: Medicare PPO | Attending: Cardiology | Admitting: Cardiology

## 2022-02-06 VITALS — BP 140/82 | HR 72 | Ht 65.0 in | Wt 109.2 lb

## 2022-02-06 DIAGNOSIS — I5032 Chronic diastolic (congestive) heart failure: Secondary | ICD-10-CM

## 2022-02-06 DIAGNOSIS — E7800 Pure hypercholesterolemia, unspecified: Secondary | ICD-10-CM

## 2022-02-06 DIAGNOSIS — R21 Rash and other nonspecific skin eruption: Secondary | ICD-10-CM | POA: Diagnosis not present

## 2022-02-06 DIAGNOSIS — I10 Essential (primary) hypertension: Secondary | ICD-10-CM

## 2022-02-06 NOTE — Patient Instructions (Signed)
Medication Instructions:  Your physician recommends that you continue on your current medications as directed. Please refer to the Current Medication list given to you today. ' *If you need a refill on your cardiac medications before your next appointment, please call your pharmacy*   Follow-Up: At [facility], you and your health needs are our priority.  As part of our continuing mission to provide you with exceptional heart care, we have created designated Provider Care Teams.  These Care Teams include your primary Cardiologist (physician) and Advanced Practice Providers (APPs -  Physician Assistants and Nurse Practitioners) who all work together to provide you with the care you need, when you need it.  We recommend signing up for the patient portal called "MyChart".  Sign up information is provided on this After Visit Summary.  MyChart is used to connect with patients for Virtual Visits (Telemedicine).  Patients are able to view lab/test results, encounter notes, upcoming appointments, etc.  Non-urgent messages can be sent to your provider as well.   To learn more about what you can do with MyChart, go to [email or web address].    Your next appointment:   1 year(s)  The format for your next appointment:   In Person  Provider:   Traci Turner, MD     Important Information About Sugar       

## 2022-02-06 NOTE — Progress Notes (Unsigned)
Cardiology Office Note:    Date:  02/06/2022   ID:  Brenda Lynch, DOB [birth year], MRN [id number]  PCP:  [name], MD  Cardiologist:  Fransico Him, MD    Referring MD: [name], MD   Chief Complaint  Patient presents with   Congestive Heart Failure   Hypertension    History of Present Illness:    Brenda Lynch is a 88 y.o. female with a hx of HTN,  chronic diastolic CHF, chronic SOB secondary to chronic diastolic CHF/sedentary lifestyle and anxiety, chronically abnormal EKG with LVH and repolarization.    She is here today for followup and is doing well.  She is here with her son who says that she is doing very well.  SHe had COVID 19 at the beginning of the month but otherwise has done very well.  She has had a decline in her memory and does not remember who I am.  She denies any chest pain or pressure, SOB, DOE, PND, orthopnea, LE edema, dizziness, palpitations or syncope.      Past Medical History:  Diagnosis Date   Allergic rhinitis    Chronic diastolic CHF (congestive heart failure) ([place])    CKD (chronic kidney disease), stage II    Coronary artery disease    Diabetes mellitus    Diverticulosis    Drug-induced bradycardia 01/28/2016   Hypercholesterolemia    Hypertension    Hyponatremia    Meningioma (HCC)    Osteoarthritis    Sedentary lifestyle    Spinal stenosis    Vertigo, benign positional     Past Surgical History:  Procedure Laterality Date   BACK SURGERY     BREAST BIOPSY     SKIN FULL THICKNESS GRAFT Right 11/08/2017   Procedure: RIGHT SMALL FINGER ABLATION OF NAIL ANDSKIN GRAFT FULL THICKNESS;  Surgeon: Leanora Cover, MD;  Location: [facility];  Service: Orthopedics;  Laterality: Right;   TONSILLECTOMY      Current Medications: Current Meds  Medication Sig   acetaminophen (TYLENOL) 500 MG tablet Take 500 mg by mouth every 6 (six) hours as needed (for headaches or mild pain).   ALPRAZolam (XANAX) 0.25 MG tablet Take 1  tablet by mouth 2 (two) times daily as needed.   atorvastatin (LIPITOR) 20 MG tablet Take 1 tablet by mouth daily.   calcium carbonate (TUMS - DOSED IN MG ELEMENTAL CALCIUM) 500 MG chewable tablet Chew 1 tablet by mouth daily as needed for indigestion or heartburn.   cephALEXin (KEFLEX) 500 MG capsule Take 500 mg by mouth 2 (two) times daily.   Cholecalciferol (VITAMIN D3) 25 MCG (1000 UT) CAPS Take 1,000 Units by mouth in the morning.   clopidogrel (PLAVIX) 75 MG tablet Take 1 tablet by mouth daily.   fluticasone (FLONASE) 50 MCG/ACT nasal spray Place 1-2 sprays into both nostrils daily as needed for allergies or rhinitis.   hydrochlorothiazide (MICROZIDE) 12.5 MG capsule TAKE ONE CAPSULE ONCE DAILY   hydrOXYzine (ATARAX) 25 MG tablet Take 25 mg by mouth at bedtime as needed for anxiety.   loperamide (IMODIUM A-D) 2 MG tablet Take 2 mg by mouth 4 (four) times daily as needed for diarrhea or loose stools.   Melatonin 10 MG TABS Take 10 mg by mouth at bedtime as needed (for sleep).   Misc Natural Products (OSTEO BI-FLEX ADV DOUBLE ST PO) Take 1 tablet by mouth daily.   montelukast (SINGULAIR) 10 MG tablet Take 10 mg by mouth at bedtime.  ondansetron (ZOFRAN) 4 MG tablet Take 4 mg by mouth daily as needed for nausea or vomiting.   Polyethyl Glyc-Propyl Glyc PF (SYSTANE HYDRATION PF) 0.4-0.3 % SOLN Place 1 drop into both eyes 3 (three) times daily as needed (for irritation).   polyethylene glycol powder (GLYCOLAX/MIRALAX) 17 GM/SCOOP powder Take 8.5-17 g by mouth daily as needed for mild constipation (MIX AND DRINK).   potassium chloride (MICRO-K) 10 MEQ CR capsule Take 10 mEq by mouth daily.   PRILOSEC OTC 20 MG tablet Take 20 mg by mouth daily before breakfast.   ramipril (ALTACE) 10 MG capsule Take 10 mg by mouth daily.   Saline (SIMPLY SALINE) 0.9 % AERS Place 1 spray into both nostrils as needed (for congestion).   [DISCONTINUED] aspirin 325 MG tablet Take 1 tablet (325 mg total) by mouth  daily.   [DISCONTINUED] traZODone (DESYREL) 50 MG tablet Take 25 mg by mouth at bedtime as needed for sleep.     Allergies:   Ciprofloxacin, Doxycycline hyclate, Penicillins, and Penicillin g   Social History   Socioeconomic History   Marital status: Widowed    Spouse name: Not on file   Number of children: Not on file   Years of education: Not on file   Highest education level: Not on file  Occupational History   Occupation: retired  Tobacco Use   Smoking status: Former    Types: Cigarettes    Quit date: 01/30/1979    Years since quitting: 43.0   Smokeless tobacco: Never  Vaping Use   Vaping Use: Never used  Substance and Sexual Activity   Alcohol use: No   Drug use: No   Sexual activity: Not on file  Other Topics Concern   Not on file  Social History Narrative   Lives alone with sons close by   Social Determinants of Health   Financial Resource Strain: Not on file  Food Insecurity: Not on file  Transportation Needs: Not on file  Physical Activity: Not on file  Stress: Not on file  Social Connections: Not on file     Family History: The patient's family history includes Heart attack in her mother; Heart disease in her mother; Lung cancer in her brother; Pancreatic cancer in her brother; Prostate cancer in her father. There is no history of Headache.  ROS:   Please see the history of present illness.    ROS  All other systems reviewed and negative.   EKGs/Labs/Other Studies Reviewed:    The following studies were reviewed today: EKG, outside labs from PCP on [facility]  EKG:  EKG is not ordered today   Recent Labs: 01/19/2022: ALT 17; BUN 17; Creatinine, Ser 1.10; Hemoglobin 12.6; Platelets 224; Potassium 4.7; Sodium 137   Recent Lipid Panel    Component Value Date/Time   CHOL 118 08/01/2020 0359   CHOL 207 (H) 05/16/2019 1351   TRIG 46 08/01/2020 0359   HDL 44 08/01/2020 0359   HDL 60 05/16/2019 1351   CHOLHDL 2.7 08/01/2020 0359   VLDL 9 08/01/2020 0359    LDLCALC 65 08/01/2020 0359   LDLCALC 135 (H) 05/16/2019 1351    Physical Exam:    VS:  BP (!) 140/82 (BP Location: Right Arm, Patient Position: Sitting)   Pulse 72   Ht '5\' 5"'$  (1.651 m)   Wt 109 lb 3.2 oz (49.5 kg)   BMI 18.17 kg/m     Wt Readings from Last 3 Encounters:  02/06/22 109 lb 3.2 oz (49.5 kg)  01/19/22 110 lb 10.7 oz (50.2 kg)  10/17/21 115 lb 1.3 oz (52.2 kg)    GEN: Well nourished, well developed in no acute distress HEENT: Normal NECK: No JVD; No carotid bruits LYMPHATICS: No lymphadenopathy CARDIAC:RRR, no murmurs, rubs, gallops RESPIRATORY:  Clear to auscultation without rales, wheezing or rhonchi  ABDOMEN: Soft, non-tender, non-distended MUSCULOSKELETAL:  No edema; No deformity  SKIN: Warm and dry NEUROLOGIC:  Alert and oriented x 3 PSYCHIATRIC:  Normal affect  ASSESSMENT:    1. Chronic diastolic CHF (congestive heart failure) ([place])   2. Primary hypertension   3. Hypercholesterolemia    PLAN:    In order of problems listed above:  1.  Chronic diastolic CHF -She is doing very well and is not appear volume overloaded on exam -Continue prescription drug management with HCTZ 12.5 mg daily with as needed refills  2.  HTN -BP is controlled on exam today -Continue drug management with HCTZ 12.5 mg daily and ramipril 10 mg daily with as needed refills -I have personally reviewed and interpreted outside labs performed by patient's PCP which showed serum creatinine 1.1 and potassium 4.7 01/19/2022  3.  HLD -LDL goal < 100 -followed by PCP -Continue prescription drug management atorvastatin 20 mg daily with as needed refills  4.  Intracranial arterial atherosclerosis -on Plavix followed by Dr. Deforest Hoyles  Followup with me in 1 year   Medication Adjustments/Labs and Tests Ordered: Current medicines are reviewed at length with the patient today.  Concerns regarding medicines are outlined above.  No orders of the defined types were placed in this  encounter.  No orders of the defined types were placed in this encounter.   Signed, Fransico Him, MD  02/06/2022 2:40 PM    [place]

## 2022-02-13 ENCOUNTER — Other Ambulatory Visit: Payer: Medicare PPO

## 2022-02-15 ENCOUNTER — Ambulatory Visit
Admission: RE | Admit: 2022-02-15 | Discharge: 2022-02-15 | Disposition: A | Discharge status: Home / Self Care | Payer: Medicare PPO | Source: Ambulatory Visit | Attending: Gastroenterology | Admitting: Gastroenterology

## 2022-02-15 ENCOUNTER — Other Ambulatory Visit: Payer: Self-pay | Admitting: Gastroenterology

## 2022-02-15 DIAGNOSIS — K224 Dyskinesia of esophagus: Secondary | ICD-10-CM | POA: Diagnosis not present

## 2022-02-15 DIAGNOSIS — R1310 Dysphagia, unspecified: Secondary | ICD-10-CM | POA: Diagnosis not present

## 2022-02-18 ENCOUNTER — Emergency Department (HOSPITAL_COMMUNITY)
Admission: EM | Admit: 2022-02-18 | Discharge: 2022-02-18 | Disposition: A | Discharge status: Home / Self Care | Payer: Medicare PPO | Attending: Emergency Medicine | Admitting: Emergency Medicine

## 2022-02-18 ENCOUNTER — Other Ambulatory Visit: Payer: Self-pay

## 2022-02-18 ENCOUNTER — Emergency Department (HOSPITAL_COMMUNITY): Payer: Medicare PPO

## 2022-02-18 ENCOUNTER — Encounter (HOSPITAL_COMMUNITY): Payer: Self-pay | Admitting: Emergency Medicine

## 2022-02-18 DIAGNOSIS — F0390 Unspecified dementia without behavioral disturbance: Secondary | ICD-10-CM | POA: Insufficient documentation

## 2022-02-18 DIAGNOSIS — W0110XA Fall on same level from slipping, tripping and stumbling with subsequent striking against unspecified object, initial encounter: Secondary | ICD-10-CM | POA: Insufficient documentation

## 2022-02-18 DIAGNOSIS — G319 Degenerative disease of nervous system, unspecified: Secondary | ICD-10-CM | POA: Diagnosis not present

## 2022-02-18 DIAGNOSIS — I130 Hypertensive heart and chronic kidney disease with heart failure and stage 1 through stage 4 chronic kidney disease, or unspecified chronic kidney disease: Secondary | ICD-10-CM | POA: Diagnosis not present

## 2022-02-18 DIAGNOSIS — E1122 Type 2 diabetes mellitus with diabetic chronic kidney disease: Secondary | ICD-10-CM | POA: Diagnosis not present

## 2022-02-18 DIAGNOSIS — Z79899 Other long term (current) drug therapy: Secondary | ICD-10-CM | POA: Insufficient documentation

## 2022-02-18 DIAGNOSIS — R1032 Left lower quadrant pain: Secondary | ICD-10-CM | POA: Diagnosis not present

## 2022-02-18 DIAGNOSIS — I509 Heart failure, unspecified: Secondary | ICD-10-CM | POA: Diagnosis not present

## 2022-02-18 DIAGNOSIS — N189 Chronic kidney disease, unspecified: Secondary | ICD-10-CM | POA: Insufficient documentation

## 2022-02-18 DIAGNOSIS — W19XXXA Unspecified fall, initial encounter: Secondary | ICD-10-CM

## 2022-02-18 DIAGNOSIS — I6523 Occlusion and stenosis of bilateral carotid arteries: Secondary | ICD-10-CM | POA: Diagnosis not present

## 2022-02-18 DIAGNOSIS — S0990XA Unspecified injury of head, initial encounter: Secondary | ICD-10-CM | POA: Diagnosis not present

## 2022-02-18 DIAGNOSIS — Z7902 Long term (current) use of antithrombotics/antiplatelets: Secondary | ICD-10-CM | POA: Insufficient documentation

## 2022-02-18 DIAGNOSIS — M25552 Pain in left hip: Secondary | ICD-10-CM | POA: Diagnosis not present

## 2022-02-18 DIAGNOSIS — G9389 Other specified disorders of brain: Secondary | ICD-10-CM | POA: Diagnosis not present

## 2022-02-18 DIAGNOSIS — M25512 Pain in left shoulder: Secondary | ICD-10-CM | POA: Diagnosis not present

## 2022-02-18 MED ORDER — ACETAMINOPHEN 500 MG PO TABS: 500.0000 mg | ORAL_TABLET | ORAL | Status: DC

## 2022-02-18 NOTE — Progress Notes (Signed)
Chaplain responded to fall on blood thinners, Trauma L2.  Facilitated storytelling and provided the ministry of presence with son and pt, offered prayer.  Faith is an important resource to this family (members, [place]). Son is very attentive; pt appears tired, but expressed appreciation for visit. Please contact for ongoing support needs.   Minus [place], [place] Pager:  [phone]    02/18/22 1700  Clinical Encounter Type  Visited With Patient and family together  Visit Type Initial;Spiritual support  Referral From Nurse  Consult/Referral To Chaplain  Spiritual Encounters  Spiritual Needs Prayer  Stress Factors  Patient Stress Factors Health changes  Family Stress Factors Family relationships

## 2022-02-18 NOTE — Progress Notes (Signed)
Orthopedic Tech Progress Note Patient Details:  Brenda Lynch Apr 19, 1923 [id number] Level 2 Trauma  Patient ID: [name], female   DOB: 05-16-1923, 88 y.o.   MRN: [id number]  Jearld Lesch 02/18/2022, 4:37 PM

## 2022-02-18 NOTE — ED Provider Notes (Signed)
[facility]ERGENCY DEPARTMENT Provider Note   CSN: [id number] Arrival date & time: 02/18/22  1549     History  Chief Complaint  Patient presents with   Fall    Brenda Lynch is a 88 y.o. female.  HPI Patient is a 88 year old female with a past medical history significant for hypertension CHF high cholesterol DM 2, TIA, CKD  On Plavix for intracranial arterial atherosclerosis  On no other anticoagulation  History is obtained from son who is at bedside with mother.  It seems that just prior to arrival when son was looking away patient fell backwards and struck her head against the ground.  No loss of consciousness no nausea vomiting or altered mental status no slurring of speech confusion but son called EMS and she was brought to emergency room for evaluation.  She denies any pain other than some left groin pain.  Son confirms that she is at her baseline.  Seems that she normally walks with walker, is generally conversant but has significant dementia.  She does not have a memory of this episode but son states is not abnormal for her.      Home Medications Prior to Admission medications   Medication Sig Start Date End Date Taking? Authorizing Provider  acetaminophen (TYLENOL) 500 MG tablet Take 500 mg by mouth every 6 (six) hours as needed (for headaches or mild pain).    [provider]  ALPRAZolam Duanne Moron) 0.25 MG tablet Take 1 tablet by mouth 2 (two) times daily as needed.    [provider]  atorvastatin (LIPITOR) 20 MG tablet Take 1 tablet by mouth daily.    [provider]  calcium carbonate (TUMS - DOSED IN MG ELEMENTAL CALCIUM) 500 MG chewable tablet Chew 1 tablet by mouth daily as needed for indigestion or heartburn.    [provider]  cephALEXin (KEFLEX) 500 MG capsule Take 500 mg by mouth 2 (two) times daily. 11/01/21   [provider]  Cholecalciferol (VITAMIN D3) 25 MCG (1000 UT) CAPS Take 1,000  Units by mouth in the morning.    [provider]  clopidogrel (PLAVIX) 75 MG tablet Take 1 tablet by mouth daily.    [provider]  fluticasone (FLONASE) 50 MCG/ACT nasal spray Place 1-2 sprays into both nostrils daily as needed for allergies or rhinitis.    [provider]  hydrochlorothiazide (MICROZIDE) 12.5 MG capsule TAKE ONE CAPSULE ONCE DAILY 05/17/21   Sueanne Margarita, MD  hydrOXYzine (ATARAX) 25 MG tablet Take 25 mg by mouth at bedtime as needed for anxiety.    [provider]  loperamide (IMODIUM A-D) 2 MG tablet Take 2 mg by mouth 4 (four) times daily as needed for diarrhea or loose stools.    [provider]  Melatonin 10 MG TABS Take 10 mg by mouth at bedtime as needed (for sleep).    [provider]  Misc Natural Products (OSTEO BI-FLEX ADV DOUBLE ST PO) Take 1 tablet by mouth daily.    [provider]  montelukast (SINGULAIR) 10 MG tablet Take 10 mg by mouth at bedtime. 12/12/12   [provider]  ondansetron (ZOFRAN) 4 MG tablet Take 4 mg by mouth daily as needed for nausea or vomiting. 09/27/20   [provider]  Polyethyl Glyc-Propyl Glyc PF (SYSTANE HYDRATION PF) 0.4-0.3 % SOLN Place 1 drop into both eyes 3 (three) times daily as needed (for irritation).    [provider]  polyethylene glycol  powder (GLYCOLAX/MIRALAX) 17 GM/SCOOP powder Take 8.5-17 g by mouth daily as needed for mild constipation (MIX AND DRINK).    [provider]  potassium chloride (MICRO-K) 10 MEQ CR capsule Take 10 mEq by mouth daily.    [provider]  PRILOSEC OTC 20 MG tablet Take 20 mg by mouth daily before breakfast.    [provider]  ramipril (ALTACE) 10 MG capsule Take 10 mg by mouth daily.    [provider]  Saline (SIMPLY SALINE) 0.9 % AERS Place 1 spray into both nostrils as needed (for congestion).    [provider]      Allergies    Ciprofloxacin,  Doxycycline hyclate, Penicillins, and Penicillin g    Review of Systems   Review of Systems  Physical Exam Updated Vital Signs BP 122/70 (BP Location: Right Arm)   Pulse 69   Temp 98.5 F (36.9 C) (Oral)   Resp 18   SpO2 100%  Physical Exam Vitals and nursing note reviewed.  Constitutional:      General: She is not in acute distress.    Comments: Cachectic 88 year old female in no acute distress.  HENT:     Head: Normocephalic and atraumatic.     Nose: Nose normal.     Mouth/Throat:     Mouth: Mucous membranes are moist.  Eyes:     General: No scleral icterus. Cardiovascular:     Rate and Rhythm: Normal rate and regular rhythm.     Pulses: Normal pulses.     Heart sounds: Normal heart sounds.  Pulmonary:     Effort: Pulmonary effort is normal. No respiratory distress.     Breath sounds: No wheezing.  Abdominal:     Palpations: Abdomen is soft.     Tenderness: There is no abdominal tenderness.  Musculoskeletal:     Cervical back: Normal range of motion.     Right lower leg: No edema.     Left lower leg: No edema.     Comments: No bony tenderness over joints or long bones of the upper and lower extremities. Some Discomfort with ROM of L hip.   No neck or back midline tenderness, step-off, deformity, or bruising. Able to turn head left and right 45 degrees without difficulty.  Full range of motion of upper and lower extremity joints shown after palpation was conducted; with 5/5 symmetrical strength in upper and lower extremities. No chest wall tenderness, no facial or cranial tenderness.   Patient has intact sensation grossly in lower and upper extremities. Patient able to ambulate without difficulty.  Radial and DP pulses palpated BL.    Skin:    General: Skin is warm and dry.     Capillary Refill: Capillary refill takes less than 2 seconds.  Neurological:     Mental Status: She is alert. Mental status is at baseline.     Comments: Moves all 4 extremities, smile  symmetric, sensation intact in all 4 extremities, oriented to self and location which is baseline for patient per son at bedside  Psychiatric:        Mood and Affect: Mood normal.        Behavior: Behavior normal.     ED Results / Procedures / Treatments   Labs (all labs ordered are listed, but only abnormal results are displayed) Labs Reviewed - No data to display  EKG None  Radiology No results found.  Procedures Procedures    Medications Ordered in ED Medications  acetaminophen (TYLENOL)  tablet 500 mg (has no administration in time range)    ED Course/ Medical Decision Making/ A&P Clinical Course as of 02/18/22 2113  Sat Feb 18, 2022  1735 This is a 88 year old female with a history of dementia presenting from home in the company of her son after a mechanical fall, falling backwards and striking her head on the ground.  She is on Plavix.  The patient complains only of some discomfort in her left hip but has full range of motion of the bilateral extremities, no visible contusions or deformities.  X-ray of the hip shows no acute fracture per my review.  CT of the head also shows no acute intracranial bleed.  She is stable for discharge in the company of her son [MT]    Clinical Course User Index [MT] Trifan, Carola Rhine, MD                           Medical Decision Making Amount and/or Complexity of Data Reviewed Radiology: ordered.  Risk OTC drugs.  Patient is a 88 year old female with a past medical history significant for hypertension CHF high cholesterol DM 2, TIA, CKD  On Plavix for intracranial arterial atherosclerosis  On no other anticoagulation  History is obtained from son who is at bedside with mother.  It seems that just prior to arrival when son was looking away patient fell backwards and struck her head against the ground.  No loss of consciousness no nausea vomiting or altered mental status no slurring of speech confusion but son called EMS and she  was brought to emergency room for evaluation.  She denies any pain other than some left groin pain.  Son confirms that she is at her baseline.  Seems that she normally walks with walker, is generally conversant but has significant dementia.  She does not have a memory of this episode but son states is not abnormal for her.  Patient has reassuring exam per son patient is at her baseline mental status and she is following my commands and overall relatively well-appearing.   CT head and pelvic x-ray without abnormal findings.  Will discharge home at this time with son.  Return precautions discussed.  She is moving her legs well. I personally viewed CT head and pelvic x-ray do not appreciate any abnormal findings Agree with radiology read no abnormal findings  I discussed this case with my attending physician who cosigned this note including patient's presenting symptoms, physical exam, and planned diagnostics and interventions. Attending physician stated agreement with plan or made changes to plan which were implemented.   Attending physician assessed patient at bedside.   Will follow-up with PCP.  She is irritable and has dementia and is becoming somewhat more combative.  Would like to ambulate her here however she is not interested in this.  She is moving her legs well and I have a low suspicion for a fracture.  Son agreeable to plan.  Final Clinical Impression(s) / ED Diagnoses Final diagnoses:  Fall, initial encounter    Rx / DC Orders ED Discharge Orders     None         Tedd Sias, [place] 02/18/22 2115    Wyvonnia Dusky, MD 02/19/22 [phone]

## 2022-02-18 NOTE — ED Notes (Signed)
Delay in CT as xray did not initially respond to trauma. This RN placed call at 1647 for pelvic xray before taking to CT.

## 2022-02-18 NOTE — ED Triage Notes (Signed)
Patient arrives POV as a level 2 fall on blood thinners- Plavix. Patient had a mechanical fall witnessed by son and is complaining of L groin pain. Dementia and mentating at baseline.

## 2022-02-18 NOTE — Discharge Instructions (Signed)
The x-ray and CT scan done today were without any abnormal findings.  Tylenol 1000 mg every 6 hours for pain.  Follow-up with your primary care provider.

## 2022-02-22 ENCOUNTER — Ambulatory Visit: Payer: Medicare PPO | Admitting: Podiatry

## 2022-02-22 ENCOUNTER — Encounter: Payer: Self-pay | Admitting: Podiatry

## 2022-02-22 VITALS — BP 141/77

## 2022-02-22 DIAGNOSIS — B351 Tinea unguium: Secondary | ICD-10-CM | POA: Diagnosis not present

## 2022-02-22 DIAGNOSIS — M79674 Pain in right toe(s): Secondary | ICD-10-CM

## 2022-02-22 DIAGNOSIS — E785 Hyperlipidemia, unspecified: Secondary | ICD-10-CM | POA: Diagnosis not present

## 2022-02-22 DIAGNOSIS — M2042 Other hammer toe(s) (acquired), left foot: Secondary | ICD-10-CM

## 2022-02-22 DIAGNOSIS — E1169 Type 2 diabetes mellitus with other specified complication: Secondary | ICD-10-CM

## 2022-02-22 DIAGNOSIS — M79675 Pain in left toe(s): Secondary | ICD-10-CM

## 2022-02-22 DIAGNOSIS — M2041 Other hammer toe(s) (acquired), right foot: Secondary | ICD-10-CM | POA: Diagnosis not present

## 2022-02-22 DIAGNOSIS — E119 Type 2 diabetes mellitus without complications: Secondary | ICD-10-CM | POA: Diagnosis not present

## 2022-02-22 NOTE — Patient Instructions (Signed)
[email or web address]  Double depth shoes.

## 2022-02-26 NOTE — Progress Notes (Signed)
  Subjective:  Patient ID: Brenda Lynch, female    DOB: 07/01/1923,  MRN: [id number]  Brenda Lynch presents to clinic today for for annual diabetic foot examination. She is accompanied by her son and caregiver on today's visit. Chief Complaint  Patient presents with   Nail Problem    Diabetic foot care BS-did not check today A1C-6.1 PCP-Hussain, Karrar PCP VST- 1 month ago   New problem(s): None.   PCP is [name], MD.  Allergies  Allergen Reactions   Ciprofloxacin Nausea Only and Other (See Comments)    GI intolerance  Other reaction(s): GI intol   Doxycycline Hyclate Other (See Comments)    BS elev, Head pressure    Penicillins Hives and Other (See Comments)    Welts, also Has patient had a PCN reaction causing immediate rash, facial/tongue/throat swelling, SOB or lightheadedness with hypotension: Yes Has patient had a PCN reaction causing severe rash involving mucus membranes or skin necrosis: No Has patient had a PCN reaction that required hospitalization No Has patient had a PCN reaction occurring within the last 10 years: No If all of the above answers are "NO", then may proceed with Cephalosporin use.   Penicillin G Hives and Other (See Comments)    Welts on the body, also     Review of Systems: Negative except as noted in the HPI.  Objective: No changes noted in today's physical examination. Vitals:   02/22/22 0946  BP: (!) 141/77   Brenda Lynch is a pleasant 88 y.o. female WD, WN in NAD. AAO x 3.  Vascular Capillary fill time to digits immediate b/l.  DP/PT pulse(s) are palpable b/l lower extremities. Pedal hair absent. Lower extremity skin temperature gradient within normal limits. No pain with calf compression b/l. No edema noted b/l lower extremities. No cyanosis or clubbing noted.   Neurologic Protective sensation intact 5/5 intact bilaterally with 10g monofilament b/l. Vibratory sensation intact b/l. No clonus b/l.   Dermatologic Toenails 1-5  b/l elongated, discolored, dystrophic, thickened, crumbly with subungual debris and tenderness to dorsal palpation. Pedal skin thin, shiny and atrophic b/l LE. Resolved hyperkeratotic lesion(s) dorsal PIPJ of L 4th toe.  No erythema, no edema, no drainage, no fluctuance.  Orthopedic: Muscle strength 4/5 to all lower extremity muscle groups bilaterally. Hammertoe deformity noted 2-5 b/l. Utilizes cane for ambulation assistance.   Assessment/Plan: 1. Pain due to onychomycosis of toenails of both feet   2. Acquired hammertoes of both feet   3. Type 2 diabetes mellitus with hyperlipidemia ([place])   4. Encounter for diabetic foot exam ([place])     No orders of the defined types were placed in this encounter.   -Patient's family member present. All questions/concerns addressed on today's visit. -Diabetic foot examination performed today. -Patient to continue soft, supportive shoe gear daily. -Toenails 1-5 b/l were debrided in length and girth to patient tolerance with dremel without iatrogenic bleeding.  -We discussed shoes with high toe box such as [employer] Double Depth shoes. -Patient/POA to call should there be question/concern in the interim.   Return in about 3 months (around 05/24/2022).  Marzetta Board, DPM

## 2022-03-16 DIAGNOSIS — N390 Urinary tract infection, site not specified: Secondary | ICD-10-CM | POA: Diagnosis not present

## 2022-03-23 IMAGING — CT CT HEAD W/O CM
3 series · 15 of 47 positions shown, 18 images · non-contrast
Comparison: 07/31/2020

CLINICAL DATA: TIA

EXAM:
CT HEAD WITHOUT CONTRAST
TECHNIQUE: Contiguous axial images were obtained from the base of the skull
through the vertex without intravenous contrast.

[Series 4: head 5.0 h30s · axial · 0.56mm/px · z∈[+1198,+1333]mm · 9 of 33 slices shown, 12 images]
[im 3/33  brain]
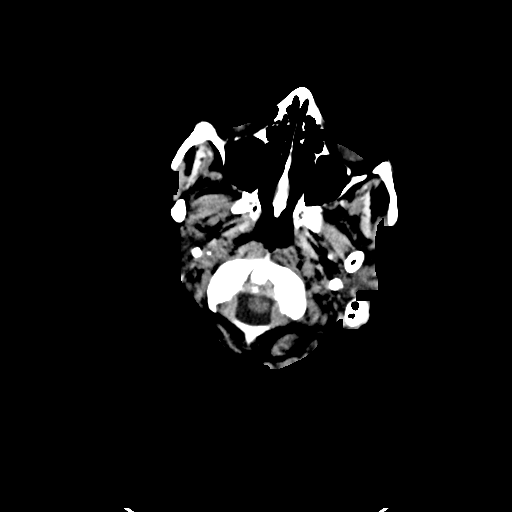
[im 3/33  bone]
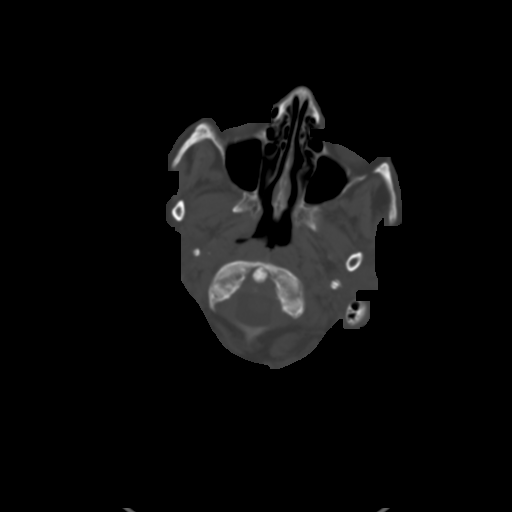
[im 6/33  brain]
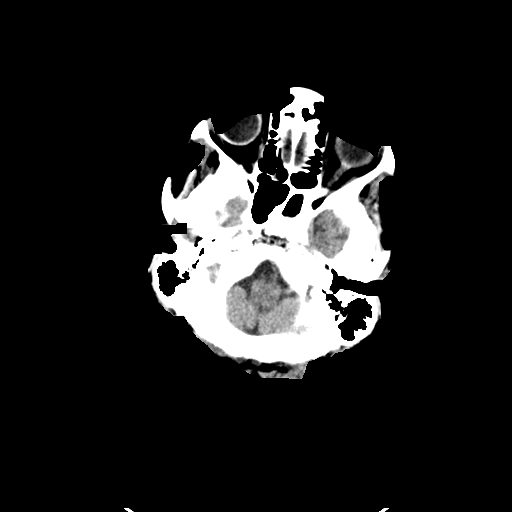
[im 9/33  brain]
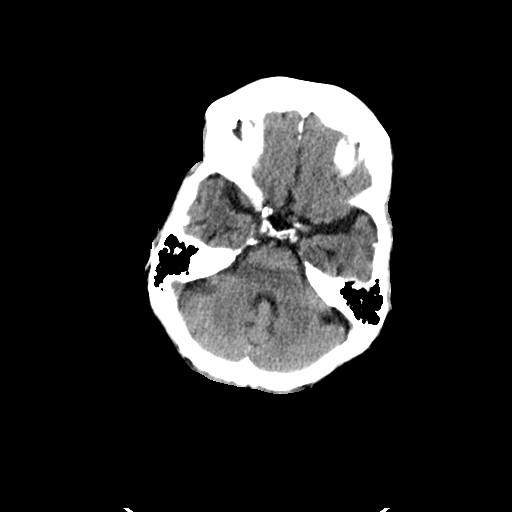
[im 13/33  brain]
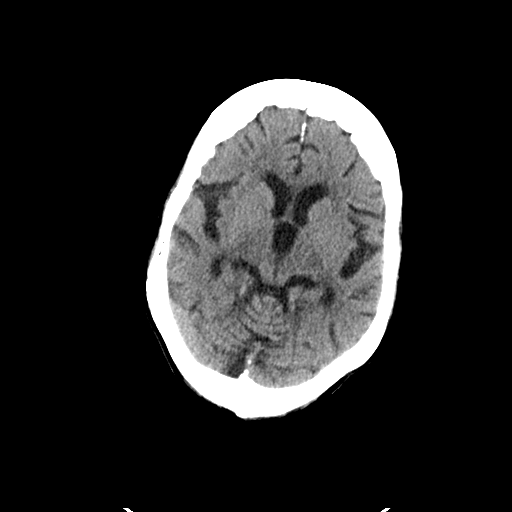
[im 17/33  brain]
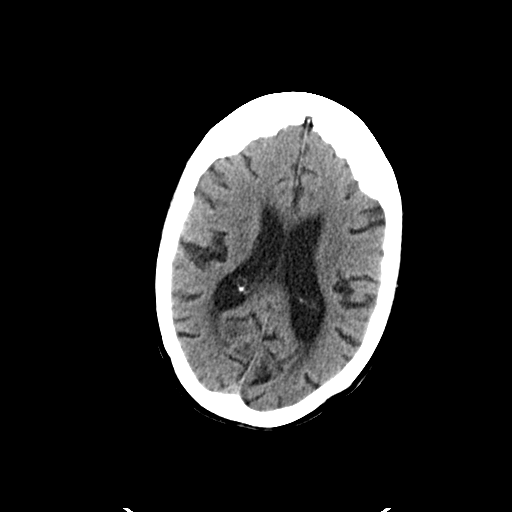
[im 17/33  bone]
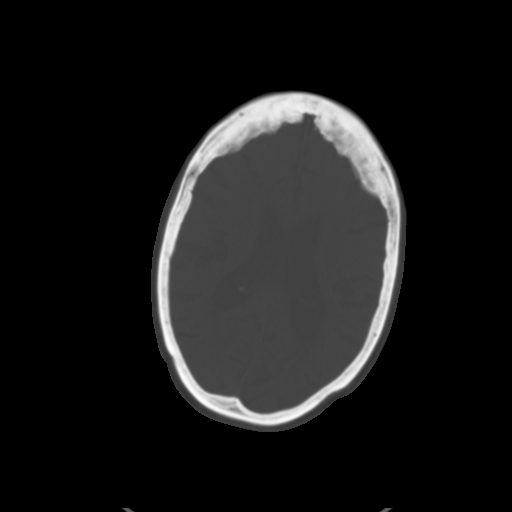
[im 20/33  brain]
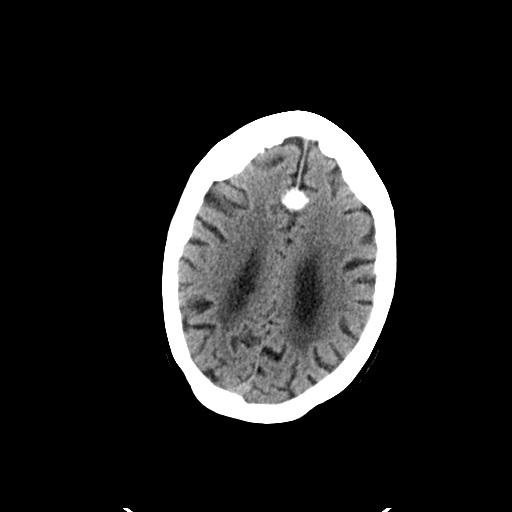
[im 24/33  brain]
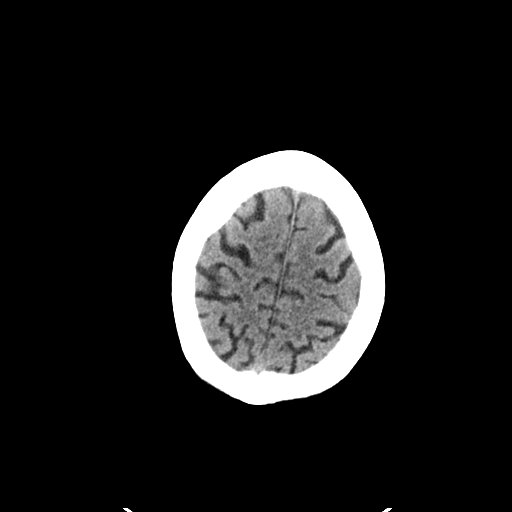
[im 27/33  brain]
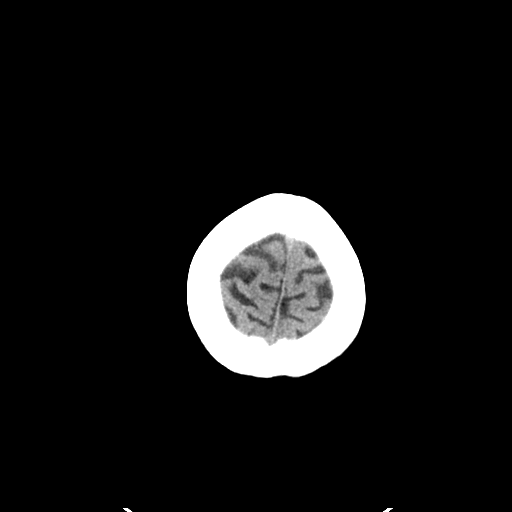
[im 30/33  brain]
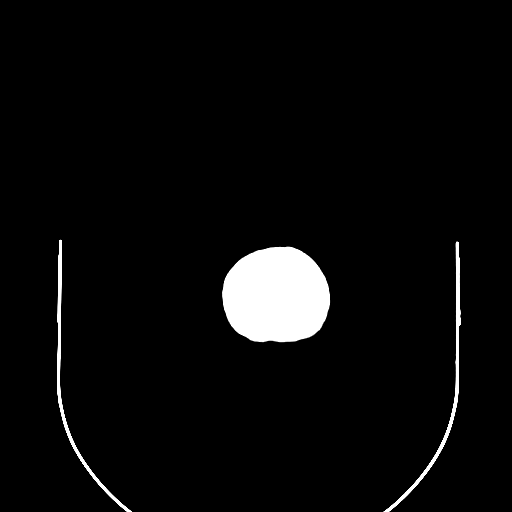
[im 30/33  bone]
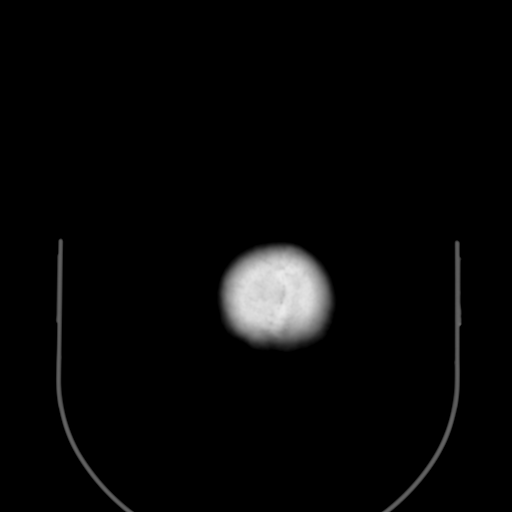

[Series 5: head 3.0 mpr cor · coronal · 0.32mm/px · 3 of 67 slices shown]
[im 23/67  brain]
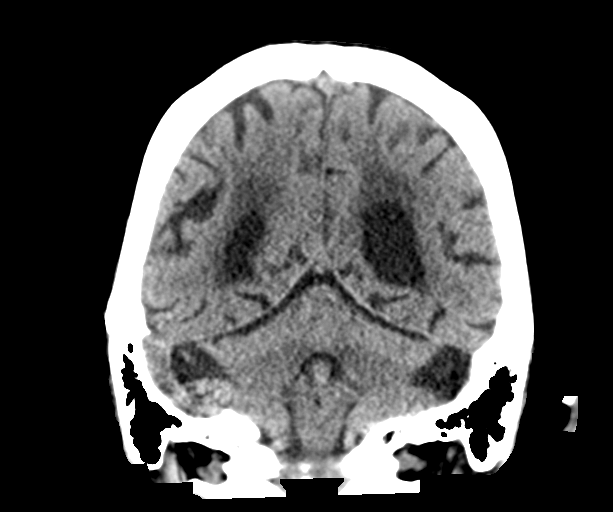
[im 30/67  brain]
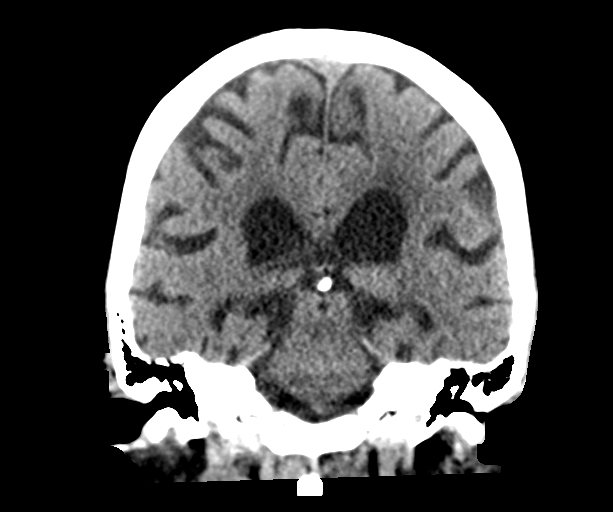
[im 37/67  brain]
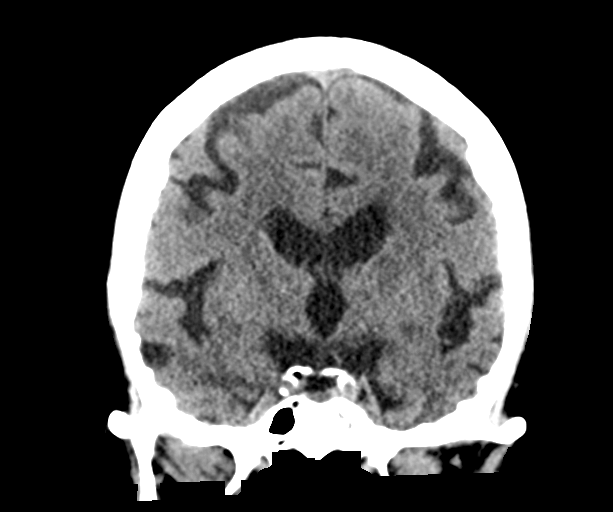

[Series 6: head 3.0 mpr sag · sagittal · 0.32mm/px · 3 of 52 slices shown]
[im 18/52  brain]
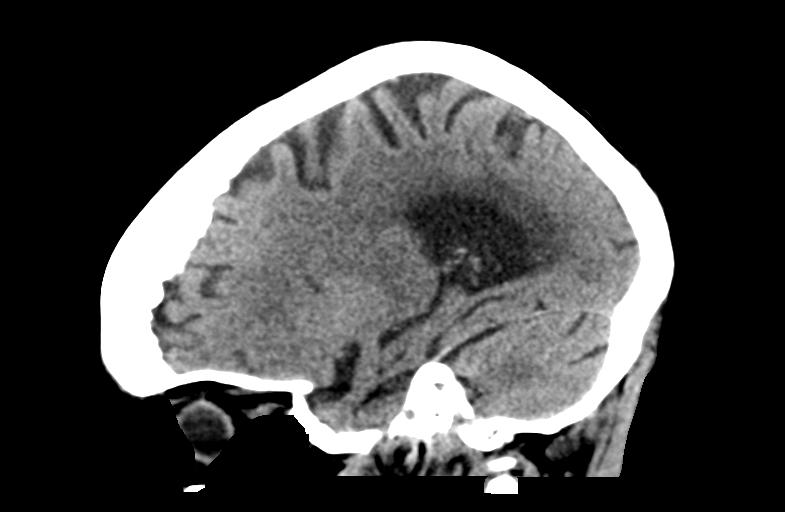
[im 26/52  brain]
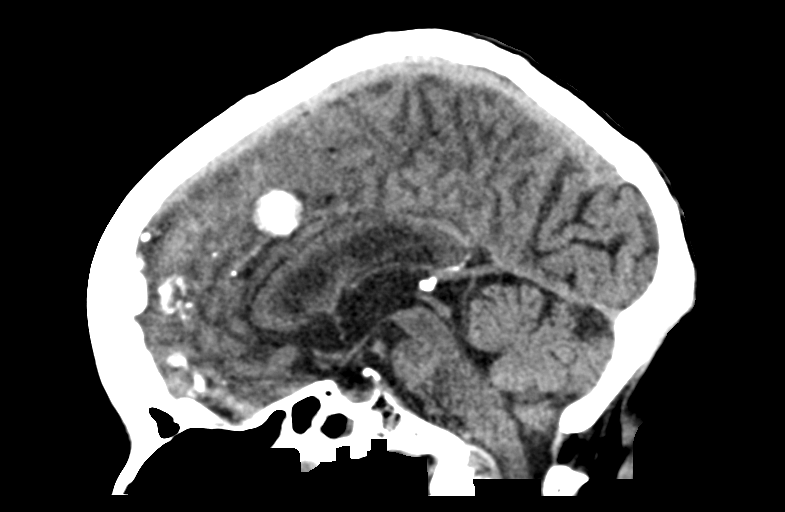
[im 35/52  brain]
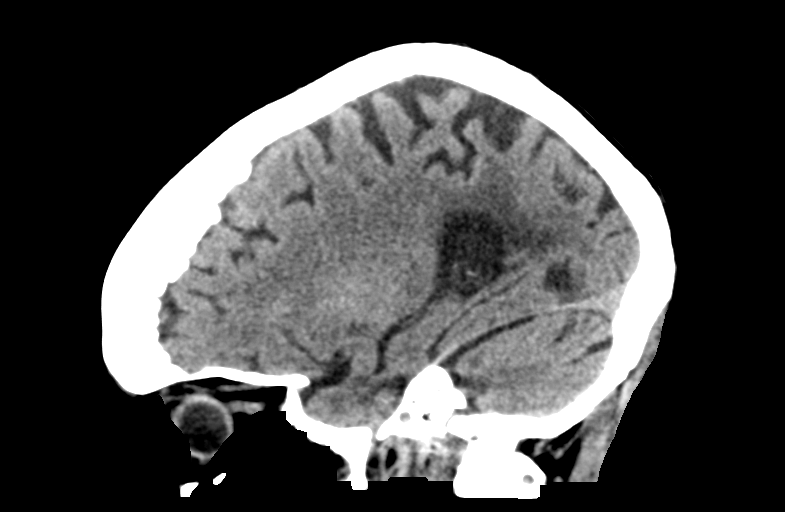

[15 of 47 positions shown; findings below may reference images not displayed]

FINDINGS: Brain: Calcified midline structure along the anterior falx is again
noted, measuring 1.4 cm compatible with meningioma. There is atrophy
and chronic small vessel disease changes. No hemorrhage,
hydrocephalus or acute infarction.

Vascular: No hyperdense vessel or unexpected calcification.

Skull: No acute calvarial abnormality.

Sinuses/Orbits: No acute findings

Other: None
IMPRESSION: Atrophy, chronic microvascular disease.

No acute intracranial abnormality.

Stable 13 mm anterior falcine meningioma.

## 2022-04-16 ENCOUNTER — Other Ambulatory Visit: Payer: Self-pay | Admitting: Cardiology

## 2022-06-02 ENCOUNTER — Encounter: Payer: Self-pay | Admitting: Podiatry

## 2022-06-06 ENCOUNTER — Encounter: Payer: Self-pay | Admitting: Podiatry

## 2022-06-06 ENCOUNTER — Ambulatory Visit (INDEPENDENT_AMBULATORY_CARE_PROVIDER_SITE_OTHER): Payer: Medicare PPO | Admitting: Podiatry

## 2022-06-06 VITALS — BP 147/61

## 2022-06-06 DIAGNOSIS — E1169 Type 2 diabetes mellitus with other specified complication: Secondary | ICD-10-CM

## 2022-06-06 DIAGNOSIS — M79674 Pain in right toe(s): Secondary | ICD-10-CM | POA: Diagnosis not present

## 2022-06-06 DIAGNOSIS — E785 Hyperlipidemia, unspecified: Secondary | ICD-10-CM

## 2022-06-06 DIAGNOSIS — B351 Tinea unguium: Secondary | ICD-10-CM | POA: Diagnosis not present

## 2022-06-06 DIAGNOSIS — M79675 Pain in left toe(s): Secondary | ICD-10-CM

## 2022-06-06 NOTE — Progress Notes (Unsigned)
  Subjective:  Patient ID: Brenda Lynch, female    DOB: 03/06/1924,  MRN: [id number]  Brenda Lynch presents to clinic today for {jgcomplaint:23593}  Chief Complaint  Patient presents with   Nail Problem    DFC BS-did not check today A1C-6.1 PCP-Husain PCP VST-03/2022   New problem(s): None. {jgcomplaint:23593}  PCP is [name], MD.  Allergies  Allergen Reactions   Ciprofloxacin Nausea Only and Other (See Comments)    GI intolerance  Other reaction(s): GI intol   Doxycycline Hyclate Other (See Comments)    BS elev, Head pressure    Penicillins Hives and Other (See Comments)    Welts, also Has patient had a PCN reaction causing immediate rash, facial/tongue/throat swelling, SOB or lightheadedness with hypotension: Yes Has patient had a PCN reaction causing severe rash involving mucus membranes or skin necrosis: No Has patient had a PCN reaction that required hospitalization No Has patient had a PCN reaction occurring within the last 10 years: No If all of the above answers are "NO", then may proceed with Cephalosporin use.   Penicillin G Hives and Other (See Comments)    Welts on the body, also     Review of Systems: Negative except as noted in the HPI.  Objective: No changes noted in today's physical examination. Vitals:   06/06/22 1007  BP: (!) 147/61   Brenda Lynch is a pleasant 89 y.o. female {jgbodyhabitus:24098} AAO x 3.  Vascular Capillary fill time to digits immediate b/l.  DP/PT pulse(s) are palpable b/l lower extremities. Pedal hair absent. Lower extremity skin temperature gradient within normal limits. No pain with calf compression b/l. No edema noted b/l lower extremities. No cyanosis or clubbing noted.   Neurologic Protective sensation intact 5/5 intact bilaterally with 10g monofilament b/l. Vibratory sensation intact b/l. No clonus b/l.   Dermatologic Toenails 1-5 b/l elongated, discolored, dystrophic, thickened, crumbly with subungual debris  and tenderness to dorsal palpation. Pedal skin thin, shiny and atrophic b/l LE. Resolved hyperkeratotic lesion(s) dorsal PIPJ of L 4th toe.  No erythema, no edema, no drainage, no fluctuance.  Orthopedic: Muscle strength 4/5 to all lower extremity muscle groups bilaterally. Hammertoe deformity noted 2-5 b/l. Utilizes cane for ambulation assistance.   Assessment/Plan: No diagnosis found.  No orders of the defined types were placed in this encounter.   None {Jgplan:23602::"-Patient/POA to call should there be question/concern in the interim."}   Return in about 3 months (around 09/06/2022).  Marzetta Board, DPM

## 2022-06-09 ENCOUNTER — Ambulatory Visit: Payer: Medicare PPO | Admitting: Podiatry

## 2022-06-12 DIAGNOSIS — Z961 Presence of intraocular lens: Secondary | ICD-10-CM | POA: Diagnosis not present

## 2022-06-12 DIAGNOSIS — E119 Type 2 diabetes mellitus without complications: Secondary | ICD-10-CM | POA: Diagnosis not present

## 2022-07-19 DIAGNOSIS — R8290 Unspecified abnormal findings in urine: Secondary | ICD-10-CM | POA: Diagnosis not present

## 2022-08-09 DIAGNOSIS — I700 Atherosclerosis of aorta: Secondary | ICD-10-CM | POA: Diagnosis not present

## 2022-08-09 DIAGNOSIS — F0390 Unspecified dementia without behavioral disturbance: Secondary | ICD-10-CM | POA: Diagnosis not present

## 2022-08-09 DIAGNOSIS — E1122 Type 2 diabetes mellitus with diabetic chronic kidney disease: Secondary | ICD-10-CM | POA: Diagnosis not present

## 2022-08-09 DIAGNOSIS — I5030 Unspecified diastolic (congestive) heart failure: Secondary | ICD-10-CM | POA: Diagnosis not present

## 2022-08-09 DIAGNOSIS — N1831 Chronic kidney disease, stage 3a: Secondary | ICD-10-CM | POA: Diagnosis not present

## 2022-08-09 DIAGNOSIS — F411 Generalized anxiety disorder: Secondary | ICD-10-CM | POA: Diagnosis not present

## 2022-08-09 DIAGNOSIS — E1142 Type 2 diabetes mellitus with diabetic polyneuropathy: Secondary | ICD-10-CM | POA: Diagnosis not present

## 2022-08-09 DIAGNOSIS — E7800 Pure hypercholesterolemia, unspecified: Secondary | ICD-10-CM | POA: Diagnosis not present

## 2022-08-09 DIAGNOSIS — E440 Moderate protein-calorie malnutrition: Secondary | ICD-10-CM | POA: Diagnosis not present

## 2022-08-09 DIAGNOSIS — Z0000 Encounter for general adult medical examination without abnormal findings: Secondary | ICD-10-CM | POA: Diagnosis not present

## 2022-08-09 DIAGNOSIS — I110 Hypertensive heart disease with heart failure: Secondary | ICD-10-CM | POA: Diagnosis not present

## 2022-10-18 ENCOUNTER — Ambulatory Visit: Payer: Medicare PPO | Admitting: Podiatry

## 2022-10-18 VITALS — BP 145/67

## 2022-10-18 DIAGNOSIS — E1169 Type 2 diabetes mellitus with other specified complication: Secondary | ICD-10-CM | POA: Diagnosis not present

## 2022-10-18 DIAGNOSIS — M79674 Pain in right toe(s): Secondary | ICD-10-CM | POA: Diagnosis not present

## 2022-10-18 DIAGNOSIS — E785 Hyperlipidemia, unspecified: Secondary | ICD-10-CM | POA: Diagnosis not present

## 2022-10-18 DIAGNOSIS — M79675 Pain in left toe(s): Secondary | ICD-10-CM

## 2022-10-18 DIAGNOSIS — B351 Tinea unguium: Secondary | ICD-10-CM

## 2022-10-22 ENCOUNTER — Encounter: Payer: Self-pay | Admitting: Podiatry

## 2022-10-22 NOTE — Progress Notes (Signed)
  Subjective:  Patient ID: Brenda Lynch, female    DOB: [birth year],  MRN: [id number]  Brenda Lynch presents to clinic today for: preventative diabetic foot care and painful thick toenails that are difficult to trim. Pain interferes with ambulation. Aggravating factors include wearing enclosed shoe gear. Pain is relieved with periodic professional debridement. She is accompanied by her caregiver and son on today's visit. Chief Complaint  Patient presents with   Nail Problem    PCP is Georgann Housekeeper, MD.  Allergies  Allergen Reactions   Ciprofloxacin Nausea Only and Other (See Comments)    GI intolerance  Other reaction(s): GI intol   Doxycycline Hyclate Other (See Comments)    BS elev, Head pressure    Penicillins Hives and Other (See Comments)    Welts, also Has patient had a PCN reaction causing immediate rash, facial/tongue/throat swelling, SOB or lightheadedness with hypotension: Yes Has patient had a PCN reaction causing severe rash involving mucus membranes or skin necrosis: No Has patient had a PCN reaction that required hospitalization No Has patient had a PCN reaction occurring within the last 10 years: No If all of the above answers are "NO", then may proceed with Cephalosporin use.   Penicillin G Hives and Other (See Comments)    Welts on the body, also     Review of Systems: Negative except as noted in the HPI.  Objective: No changes noted in today's physical examination. Vitals:   10/18/22 1056  BP: (!) 145/67    Brenda Lynch is a pleasant 89 y.o. female in NAD. AAO x 3.  Vascular Examination: Capillary refill time <3 seconds b/l LE. Faintly palpable pedal pulses b/l LE. Digital hair diminished b/l. No pedal edema b/l. Skin temperature gradient WNL b/l. No varicosities b/l. No cyanosis or clubbing noted b/l LE.Marland Kitchen  Dermatological Examination: Pedal skin with normal turgor, texture and tone b/l. No open wounds. No interdigital macerations b/l. Toenails 1-5  b/l thickened, discolored, dystrophic with subungual debris. There is pain on palpation to dorsal aspect of nailplates. No corns, calluses nor porokeratotic lesions noted..  Neurological Examination: Protective sensation intact with 10 gram monofilament b/l LE. Vibratory sensation intact b/l LE.   Musculoskeletal Examination: Muscle strength 4/5 to all lower extremity muscle groups bilaterally. Hammertoe deformity noted 2-5 b/l. Utilizes rollator for ambulation assistance.  Assessment/Plan: 1. Pain due to onychomycosis of toenails of both feet   2. Type 2 diabetes mellitus with hyperlipidemia ([facility])     -Patient with h/o dementia/Alzheimer's/cognitive deficit. Patient's family member present. All questions/concerns addressed on today's visit. -Continue supportive shoe gear daily. -Toenails 1-5 b/l were debrided in length and girth with sterile nail nippers and dremel without iatrogenic bleeding.  -Patient/POA to call should there be question/concern in the interim.   Return in about 3 months (around 01/18/2023).  Freddie Breech, DPM

## 2022-12-13 DIAGNOSIS — Z23 Encounter for immunization: Secondary | ICD-10-CM | POA: Diagnosis not present

## 2022-12-13 DIAGNOSIS — E1142 Type 2 diabetes mellitus with diabetic polyneuropathy: Secondary | ICD-10-CM | POA: Diagnosis not present

## 2022-12-13 DIAGNOSIS — E1122 Type 2 diabetes mellitus with diabetic chronic kidney disease: Secondary | ICD-10-CM | POA: Diagnosis not present

## 2022-12-13 DIAGNOSIS — G459 Transient cerebral ischemic attack, unspecified: Secondary | ICD-10-CM | POA: Diagnosis not present

## 2022-12-13 DIAGNOSIS — N1831 Chronic kidney disease, stage 3a: Secondary | ICD-10-CM | POA: Diagnosis not present

## 2022-12-13 DIAGNOSIS — I700 Atherosclerosis of aorta: Secondary | ICD-10-CM | POA: Diagnosis not present

## 2022-12-13 DIAGNOSIS — I130 Hypertensive heart and chronic kidney disease with heart failure and stage 1 through stage 4 chronic kidney disease, or unspecified chronic kidney disease: Secondary | ICD-10-CM | POA: Diagnosis not present

## 2022-12-13 DIAGNOSIS — F0393 Unspecified dementia, unspecified severity, with mood disturbance: Secondary | ICD-10-CM | POA: Diagnosis not present

## 2022-12-13 DIAGNOSIS — I5030 Unspecified diastolic (congestive) heart failure: Secondary | ICD-10-CM | POA: Diagnosis not present

## 2022-12-13 DIAGNOSIS — E440 Moderate protein-calorie malnutrition: Secondary | ICD-10-CM | POA: Diagnosis not present

## 2023-01-25 ENCOUNTER — Other Ambulatory Visit: Payer: Self-pay

## 2023-01-25 ENCOUNTER — Encounter (HOSPITAL_COMMUNITY): Payer: Self-pay

## 2023-01-25 ENCOUNTER — Emergency Department (HOSPITAL_COMMUNITY): Payer: Medicare PPO

## 2023-01-25 ENCOUNTER — Inpatient Hospital Stay (HOSPITAL_COMMUNITY)
Admission: EM | Admit: 2023-01-25 | Discharge: 2023-01-29 | DRG: 522 | Disposition: A | Discharge status: Home Health | Payer: Medicare PPO | Attending: Family Medicine | Admitting: Family Medicine

## 2023-01-25 DIAGNOSIS — Z23 Encounter for immunization: Secondary | ICD-10-CM | POA: Diagnosis not present

## 2023-01-25 DIAGNOSIS — Z86011 Personal history of benign neoplasm of the brain: Secondary | ICD-10-CM | POA: Diagnosis not present

## 2023-01-25 DIAGNOSIS — F0390 Unspecified dementia without behavioral disturbance: Secondary | ICD-10-CM | POA: Diagnosis not present

## 2023-01-25 DIAGNOSIS — S72001A Fracture of unspecified part of neck of right femur, initial encounter for closed fracture: Secondary | ICD-10-CM | POA: Diagnosis not present

## 2023-01-25 DIAGNOSIS — I2510 Atherosclerotic heart disease of native coronary artery without angina pectoris: Secondary | ICD-10-CM | POA: Diagnosis not present

## 2023-01-25 DIAGNOSIS — I10 Essential (primary) hypertension: Secondary | ICD-10-CM | POA: Diagnosis not present

## 2023-01-25 DIAGNOSIS — Y92009 Unspecified place in unspecified non-institutional (private) residence as the place of occurrence of the external cause: Secondary | ICD-10-CM | POA: Diagnosis not present

## 2023-01-25 DIAGNOSIS — Z8249 Family history of ischemic heart disease and other diseases of the circulatory system: Secondary | ICD-10-CM

## 2023-01-25 DIAGNOSIS — N183 Chronic kidney disease, stage 3 unspecified: Secondary | ICD-10-CM | POA: Diagnosis not present

## 2023-01-25 DIAGNOSIS — D72829 Elevated white blood cell count, unspecified: Secondary | ICD-10-CM | POA: Diagnosis present

## 2023-01-25 DIAGNOSIS — E7800 Pure hypercholesterolemia, unspecified: Secondary | ICD-10-CM | POA: Diagnosis not present

## 2023-01-25 DIAGNOSIS — Z7982 Long term (current) use of aspirin: Secondary | ICD-10-CM | POA: Diagnosis not present

## 2023-01-25 DIAGNOSIS — Z8679 Personal history of other diseases of the circulatory system: Secondary | ICD-10-CM

## 2023-01-25 DIAGNOSIS — W19XXXA Unspecified fall, initial encounter: Principal | ICD-10-CM

## 2023-01-25 DIAGNOSIS — S72041A Displaced fracture of base of neck of right femur, initial encounter for closed fracture: Secondary | ICD-10-CM | POA: Diagnosis not present

## 2023-01-25 DIAGNOSIS — I130 Hypertensive heart and chronic kidney disease with heart failure and stage 1 through stage 4 chronic kidney disease, or unspecified chronic kidney disease: Secondary | ICD-10-CM | POA: Diagnosis present

## 2023-01-25 DIAGNOSIS — Z881 Allergy status to other antibiotic agents status: Secondary | ICD-10-CM | POA: Diagnosis not present

## 2023-01-25 DIAGNOSIS — Z87891 Personal history of nicotine dependence: Secondary | ICD-10-CM

## 2023-01-25 DIAGNOSIS — Z79899 Other long term (current) drug therapy: Secondary | ICD-10-CM

## 2023-01-25 DIAGNOSIS — E1122 Type 2 diabetes mellitus with diabetic chronic kidney disease: Secondary | ICD-10-CM | POA: Diagnosis not present

## 2023-01-25 DIAGNOSIS — Z7401 Bed confinement status: Secondary | ICD-10-CM | POA: Diagnosis not present

## 2023-01-25 DIAGNOSIS — J310 Chronic rhinitis: Secondary | ICD-10-CM | POA: Diagnosis present

## 2023-01-25 DIAGNOSIS — Z8673 Personal history of transient ischemic attack (TIA), and cerebral infarction without residual deficits: Secondary | ICD-10-CM

## 2023-01-25 DIAGNOSIS — Z66 Do not resuscitate: Secondary | ICD-10-CM | POA: Diagnosis present

## 2023-01-25 DIAGNOSIS — Z880 Allergy status to penicillin: Secondary | ICD-10-CM | POA: Diagnosis not present

## 2023-01-25 DIAGNOSIS — M79652 Pain in left thigh: Secondary | ICD-10-CM | POA: Diagnosis not present

## 2023-01-25 DIAGNOSIS — I700 Atherosclerosis of aorta: Secondary | ICD-10-CM | POA: Diagnosis not present

## 2023-01-25 DIAGNOSIS — M1711 Unilateral primary osteoarthritis, right knee: Secondary | ICD-10-CM | POA: Diagnosis not present

## 2023-01-25 DIAGNOSIS — Z7902 Long term (current) use of antithrombotics/antiplatelets: Secondary | ICD-10-CM | POA: Diagnosis not present

## 2023-01-25 DIAGNOSIS — Z96641 Presence of right artificial hip joint: Secondary | ICD-10-CM | POA: Diagnosis not present

## 2023-01-25 DIAGNOSIS — E785 Hyperlipidemia, unspecified: Secondary | ICD-10-CM | POA: Diagnosis not present

## 2023-01-25 DIAGNOSIS — R1111 Vomiting without nausea: Secondary | ICD-10-CM | POA: Diagnosis not present

## 2023-01-25 DIAGNOSIS — I5032 Chronic diastolic (congestive) heart failure: Secondary | ICD-10-CM | POA: Diagnosis not present

## 2023-01-25 DIAGNOSIS — Z043 Encounter for examination and observation following other accident: Secondary | ICD-10-CM | POA: Diagnosis not present

## 2023-01-25 DIAGNOSIS — S72011A Unspecified intracapsular fracture of right femur, initial encounter for closed fracture: Principal | ICD-10-CM | POA: Diagnosis present

## 2023-01-25 DIAGNOSIS — N1832 Chronic kidney disease, stage 3b: Secondary | ICD-10-CM | POA: Diagnosis present

## 2023-01-25 DIAGNOSIS — Z800 Family history of malignant neoplasm of digestive organs: Secondary | ICD-10-CM

## 2023-01-25 DIAGNOSIS — G459 Transient cerebral ischemic attack, unspecified: Secondary | ICD-10-CM | POA: Diagnosis not present

## 2023-01-25 DIAGNOSIS — M8588 Other specified disorders of bone density and structure, other site: Secondary | ICD-10-CM | POA: Diagnosis not present

## 2023-01-25 DIAGNOSIS — Z801 Family history of malignant neoplasm of trachea, bronchus and lung: Secondary | ICD-10-CM | POA: Diagnosis not present

## 2023-01-25 DIAGNOSIS — Z471 Aftercare following joint replacement surgery: Secondary | ICD-10-CM | POA: Diagnosis not present

## 2023-01-25 DIAGNOSIS — M25461 Effusion, right knee: Secondary | ICD-10-CM | POA: Diagnosis not present

## 2023-01-25 DIAGNOSIS — R531 Weakness: Secondary | ICD-10-CM | POA: Diagnosis not present

## 2023-01-25 LAB — CBC WITH DIFFERENTIAL/PLATELET
Abs Immature Granulocytes: 0.05 10*3/uL (ref 0.00–0.07)
Basophils Absolute: 0 10*3/uL (ref 0.0–0.1)
Basophils Relative: 0 %
Eosinophils Absolute: 0 10*3/uL (ref 0.0–0.5)
Eosinophils Relative: 0 %
HCT: 35.5 % — ABNORMAL LOW (ref 36.0–46.0)
Hemoglobin: 11.7 g/dL — ABNORMAL LOW (ref 12.0–15.0)
Immature Granulocytes: 0 %
Lymphocytes Relative: 14 %
Lymphs Abs: 1.7 10*3/uL (ref 0.7–4.0)
MCH: 29.8 pg (ref 26.0–34.0)
MCHC: 33 g/dL (ref 30.0–36.0)
MCV: 90.3 fL (ref 80.0–100.0)
Monocytes Absolute: 0.6 10*3/uL (ref 0.1–1.0)
Monocytes Relative: 5 %
Neutro Abs: 9.5 10*3/uL — ABNORMAL HIGH (ref 1.7–7.7)
Neutrophils Relative %: 81 %
Platelets: 238 10*3/uL (ref 150–400)
RBC: 3.93 MIL/uL (ref 3.87–5.11)
RDW: 16.9 % — ABNORMAL HIGH (ref 11.5–15.5)
WBC: 11.9 10*3/uL — ABNORMAL HIGH (ref 4.0–10.5)
nRBC: 0 % (ref 0.0–0.2)

## 2023-01-25 LAB — BASIC METABOLIC PANEL
Anion gap: 10 (ref 5–15)
BUN: 17 mg/dL (ref 8–23)
CO2: 23 mmol/L (ref 22–32)
Calcium: 9.2 mg/dL (ref 8.9–10.3)
Chloride: 99 mmol/L (ref 98–111)
Creatinine, Ser: 1.16 mg/dL — ABNORMAL HIGH (ref 0.44–1.00)
GFR, Estimated: 43 mL/min — ABNORMAL LOW (ref >60)
Glucose, Bld: 162 mg/dL — ABNORMAL HIGH (ref 70–99)
Potassium: 4.1 mmol/L (ref 3.5–5.1)
Sodium: 132 mmol/L — ABNORMAL LOW (ref 135–145)

## 2023-01-25 MED ORDER — HYDROCHLOROTHIAZIDE 12.5 MG PO TABS
12.5000 mg | ORAL_TABLET | Freq: Every day | ORAL | Status: DC
  Administered 2023-01-26 – 2023-01-29 (×4): 12.5 mg via ORAL
  Filled 2023-01-25 (×4): qty 1

## 2023-01-25 MED ORDER — ACETAMINOPHEN 650 MG RE SUPP: 650.0000 mg | Freq: Four times a day (QID) | RECTAL | Status: DC | PRN

## 2023-01-25 MED ORDER — ONDANSETRON HCL 4 MG PO TABS: 4.0000 mg | ORAL_TABLET | Freq: Four times a day (QID) | ORAL | Status: DC | PRN

## 2023-01-25 MED ORDER — HYDROCODONE-ACETAMINOPHEN 5-325 MG PO TABS
1.0000 | ORAL_TABLET | ORAL | Status: DC | PRN
  Administered 2023-01-25: 2 via ORAL
  Filled 2023-01-25: qty 2

## 2023-01-25 MED ORDER — RAMIPRIL 5 MG PO CAPS
10.0000 mg | ORAL_CAPSULE | Freq: Every day | ORAL | Status: DC
  Administered 2023-01-26 – 2023-01-29 (×3): 10 mg via ORAL
  Filled 2023-01-25 (×5): qty 2

## 2023-01-25 MED ORDER — BUSPIRONE HCL 10 MG PO TABS
5.0000 mg | ORAL_TABLET | Freq: Every day | ORAL | Status: DC
  Administered 2023-01-26 – 2023-01-29 (×4): 5 mg via ORAL
  Filled 2023-01-25 (×4): qty 1

## 2023-01-25 MED ORDER — BUSPIRONE HCL 10 MG PO TABS
10.0000 mg | ORAL_TABLET | Freq: Every day | ORAL | Status: DC
  Administered 2023-01-25 – 2023-01-28 (×2): 10 mg via ORAL
  Filled 2023-01-25 (×4): qty 1

## 2023-01-25 MED ORDER — FENTANYL CITRATE PF 50 MCG/ML IJ SOSY: 12.5000 ug | PREFILLED_SYRINGE | INTRAMUSCULAR | Status: DC | PRN

## 2023-01-25 MED ORDER — ONDANSETRON HCL 4 MG/2ML IJ SOLN
4.0000 mg | Freq: Four times a day (QID) | INTRAMUSCULAR | Status: DC | PRN
Start: 2023-01-25 — End: 2023-01-27

## 2023-01-25 MED ORDER — ACETAMINOPHEN 325 MG PO TABS: 650.0000 mg | ORAL_TABLET | Freq: Four times a day (QID) | ORAL | Status: DC | PRN

## 2023-01-25 MED ORDER — MONTELUKAST SODIUM 10 MG PO TABS
10.0000 mg | ORAL_TABLET | Freq: Every day | ORAL | Status: DC
  Administered 2023-01-25 – 2023-01-28 (×2): 10 mg via ORAL
  Filled 2023-01-25 (×4): qty 1

## 2023-01-25 MED ORDER — PANTOPRAZOLE SODIUM 20 MG PO TBEC
40.0000 mg | DELAYED_RELEASE_TABLET | Freq: Every day | ORAL | Status: DC
  Administered 2023-01-26 – 2023-01-29 (×3): 40 mg via ORAL
  Filled 2023-01-25 (×3): qty 2

## 2023-01-25 MED ORDER — FENTANYL CITRATE PF 50 MCG/ML IJ SOSY
12.5000 ug | PREFILLED_SYRINGE | Freq: Once | INTRAMUSCULAR | Status: AC
  Administered 2023-01-25: 12.5 ug via INTRAVENOUS
  Filled 2023-01-25 (×2): qty 1

## 2023-01-25 MED ORDER — ATORVASTATIN CALCIUM 10 MG PO TABS: 20.0000 mg | ORAL_TABLET | Freq: Every day | ORAL | Status: DC

## 2023-01-25 MED ORDER — DEXTROSE IN LACTATED RINGERS 5 % IV SOLN: INTRAVENOUS | Status: DC

## 2023-01-25 MED ORDER — SENNOSIDES-DOCUSATE SODIUM 8.6-50 MG PO TABS: 1.0000 | ORAL_TABLET | Freq: Every evening | ORAL | Status: DC | PRN

## 2023-01-25 NOTE — H&P (Signed)
History and Physical    Brenda Lynch ZOX:[id number] DOB: Mar 17, 1924 DOA: 01/25/2023  PCP: Brenda Housekeeper, MD   Patient coming from: Home   Chief Complaint:  Chief Complaint  Patient presents with   Fall   ED TRIAGE note:Arrives [facility] from home after an assisted fall while ambulating to restroom.  son made notification with paramedics and said she was walking to restroom with walker and sometimes forgets she is walking and sits. He was able to assist her to the ground preventing a head injury but she landed on right hip.  Shortening of right leg without rotation. Holding onto right upper femur with swelling.   HPI:  Brenda Lynch is a 89 y.o. female with medical history significant of diastolic heart failure preserved EF 65 to 70%, essential hypertension, transient ischemic attack, DM type II, hyperlipidemia, dementia, CAD, and CKD stage IIIb presented to emergency department for evaluation for a mechanical fall.  Patient reported she had a fall while ambulating to her bathroom landed on her right-sided hip.  Patient denies any head injury and loss of consciousness. Due to dementia at the baseline unable to relay any history with the patient.  Patient's son helped with the history.  Reported patient had a fall as mentioned above.  Since the fall patient is complaining about right-sided upper leg pain and hip joint pain. At this moment patient denies any headache, chest pain, palpitation, abdominal pain, nausea and shortness of breath.   ED Course:  At presentation to ED patient heart rate 72, respiratory rate 15, blood pressure 145/93 O2 sat 98% room air. CBC showing leukocytosis 11.9 and stable H&H 11.7 and 35 and platelet count 238. BMP showing slight low sodium 132, blood glucose 162, creatinine 1.16 which is around baseline and GFR 43.  X-ray chest no acute cardiopulmonary process. X-ray pelvis right femoral neck fracture with varus angulation. X-ray right femur showed right  femoral neck fracture  arm hip series. No additional femoral abnormality. X-ray left femur no acute bony abnormality.  In the ED patient received fentanyl 12.5 mg one-time dose. ED physician consulted and spoke with orthopedic surgeon Dr. Duwayne Heck recommended keep patient n.p.o. and will evaluate patient soon.  Hospitalist has been contacted for further evaluation management of mechanical fall and right-sided hip fracture.  Review of Systems:  Review of Systems  Respiratory:  Negative for cough and shortness of breath.   Cardiovascular:  Negative for chest pain and palpitations.  Gastrointestinal:  Negative for abdominal pain.  Musculoskeletal:  Positive for falls and joint pain. Negative for back pain and neck pain.  Neurological:  Negative for dizziness.  Psychiatric/Behavioral:  The patient is not nervous/anxious.     Past Medical History:  Diagnosis Date   Allergic rhinitis    Chronic diastolic CHF (congestive heart failure) (HCC)    CKD (chronic kidney disease), stage II    Coronary artery disease    Diabetes mellitus    Diverticulosis    Drug-induced bradycardia 01/28/2016   Hypercholesterolemia    Hypertension    Hyponatremia    Meningioma (HCC)    Osteoarthritis    Sedentary lifestyle    Spinal stenosis    Vertigo, benign positional     Past Surgical History:  Procedure Laterality Date   BACK SURGERY     BREAST BIOPSY     SKIN FULL THICKNESS GRAFT Right 11/08/2017   Procedure: RIGHT SMALL FINGER ABLATION OF NAIL ANDSKIN GRAFT FULL THICKNESS;  Surgeon: Betha Loa, MD;  Location:  [facility];  Service: Orthopedics;  Laterality: Right;   TONSILLECTOMY       reports that she quit smoking about 44 years ago. Her smoking use included cigarettes. She has never used smokeless tobacco. She reports that she does not drink alcohol and does not use drugs.  Allergies  Allergen Reactions   Ciprofloxacin Nausea Only and Other (See Comments)    GI  intolerance  Other reaction(s): GI intol   Doxycycline Hyclate Other (See Comments)    BS elev, Head pressure    Penicillins Hives and Other (See Comments)    Welts, also Has patient had a PCN reaction causing immediate rash, facial/tongue/throat swelling, SOB or lightheadedness with hypotension: Yes Has patient had a PCN reaction causing severe rash involving mucus membranes or skin necrosis: No Has patient had a PCN reaction that required hospitalization No Has patient had a PCN reaction occurring within the last 10 years: No If all of the above answers are "NO", then may proceed with Cephalosporin use.   Penicillin G Hives and Other (See Comments)    Welts on the body, also     Family History  Problem Relation Age of Onset   Heart disease Mother    Heart attack Mother    Prostate cancer Father    Lung cancer Brother    Pancreatic cancer Brother    Headache Neg Hx     Prior to Admission medications   Medication Sig Start Date End Date Taking? Authorizing Provider  acetaminophen (TYLENOL) 500 MG tablet Take 500 mg by mouth every 6 (six) hours as needed (for headaches or mild pain).   Yes [provider]  atorvastatin (LIPITOR) 20 MG tablet Take 1 tablet by mouth daily.   Yes [provider]  busPIRone (BUSPAR) 5 MG tablet Take 5-10 mg by mouth See admin instructions. Take 1 tablet by mouth in the morning and 2 tablets at bedtime 08/09/22  Yes [provider]  Cholecalciferol (VITAMIN D3) 25 MCG (1000 UT) CAPS Take 1,000 Units by mouth in the morning.   Yes [provider]  clopidogrel (PLAVIX) 75 MG tablet Take 1 tablet by mouth daily.   Yes [provider]  hydrochlorothiazide (MICROZIDE) 12.5 MG capsule TAKE ONE CAPSULE BY MOUTH ONCE DAILY 04/17/22  Yes Turner, Cornelious Bryant, MD  Misc Natural Products (OSTEO BI-FLEX ADV DOUBLE ST PO) Take 1 tablet by mouth daily.   Yes [provider]  montelukast (SINGULAIR) 10 MG tablet Take 10  mg by mouth at bedtime. 12/12/12  Yes [provider]  Polyethyl Glyc-Propyl Glyc PF (SYSTANE HYDRATION PF) 0.4-0.3 % SOLN Place 1 drop into both eyes 3 (three) times daily as needed (for irritation).   Yes [provider]  potassium chloride (MICRO-K) 10 MEQ CR capsule Take 10 mEq by mouth daily.   Yes [provider]  PRILOSEC OTC 20 MG tablet Take 20 mg by mouth daily before breakfast.   Yes [provider]  ramipril (ALTACE) 10 MG capsule Take 10 mg by mouth daily.   Yes [provider]  nitrofurantoin (MACRODANTIN) 50 MG capsule Take 50 mg by mouth at bedtime.    [provider]  ondansetron (ZOFRAN) 4 MG tablet Take 4 mg by mouth daily as needed for nausea or vomiting. 09/27/20   [provider]  polyethylene glycol powder (GLYCOLAX/MIRALAX) 17 GM/SCOOP powder Take 8.5-17 g by mouth daily as needed for mild constipation (MIX AND DRINK).    [provider]  Saline (SIMPLY SALINE) 0.9 % AERS Place 1 spray into both nostrils as needed (for congestion).    [provider]     Physical Exam: Vitals:   01/25/23 1956 01/25/23 2045 01/25/23 2325  BP:  (!) 145/93 (!) 168/81  Pulse:  72 79  Resp:  15 19  Temp:  98.9 F (37.2 C) 98.5 F (36.9 C)  TempSrc:  Oral Oral  SpO2:  98% 95%  Weight: 47.6 kg    Height: 5\' 5"  (1.651 m)      Physical Exam Constitutional:      General: She is not in acute distress.    Appearance: She is not ill-appearing.  HENT:     Mouth/Throat:     Mouth: Mucous membranes are moist.  Eyes:     Pupils: Pupils are equal, round, and reactive to light.  Cardiovascular:     Rate and Rhythm: Normal rate and regular rhythm.     Pulses: Normal pulses.     Heart sounds: Normal heart sounds.  Pulmonary:     Effort: Pulmonary effort is normal.     Breath sounds: Normal breath sounds.  Abdominal:     General: Bowel sounds are normal.  Musculoskeletal:        General: Tenderness present.  No swelling, deformity or signs of injury.     Cervical back: Neck supple.  Skin:    Capillary Refill: Capillary refill takes less than 2 seconds.  Neurological:     Mental Status: She is alert.     Comments: Patient is alert but not oriented to self and place due to dementia at the baseline  Psychiatric:     Comments: Unable to assess      Labs on Admission: I have personally reviewed following labs and imaging studies  CBC: Recent Labs  Lab 01/25/23 2114  WBC 11.9*  NEUTROABS 9.5*  HGB 11.7*  HCT 35.5*  MCV 90.3  PLT 238   Basic Metabolic Panel: Recent Labs  Lab 01/25/23 2114  NA 132*  K 4.1  CL 99  CO2 23  GLUCOSE 162*  BUN 17  CREATININE 1.16*  CALCIUM 9.2   GFR: Estimated Creatinine Clearance: 20.8 mL/min (A) (by C-G formula based on SCr of 1.16 mg/dL (H)). Liver Function Tests: No results for input(s): "AST", "ALT", "ALKPHOS", "BILITOT", "PROT", "ALBUMIN" in the last 168 hours. No results for input(s): "LIPASE", "AMYLASE" in the last 168 hours. No results for input(s): "AMMONIA" in the last 168 hours. Coagulation Profile: No results for input(s): "INR", "PROTIME" in the last 168 hours. Cardiac Enzymes: No results for input(s): "CKTOTAL", "CKMB", "CKMBINDEX", "TROPONINI", "TROPONINIHS" in the last 168 hours. BNP (last 3 results) No results for input(s): "BNP" in the last 8760 hours. HbA1C: No results for input(s): "HGBA1C" in the last 72 hours. CBG: No results for input(s): "GLUCAP" in the last 168 hours. Lipid Profile: No results for input(s): "CHOL", "HDL", "LDLCALC", "TRIG", "CHOLHDL", "LDLDIRECT" in the last 72 hours. Thyroid Function Tests: No results for input(s): "TSH", "T4TOTAL", "FREET4", "T3FREE", "THYROIDAB" in the last 72 hours. Anemia Panel: No results for input(s): "VITAMINB12", "FOLATE", "FERRITIN", "TIBC", "IRON", "RETICCTPCT" in the last 72 hours. Urine analysis:    Component Value Date/Time   COLORURINE YELLOW 01/19/2022 2322    APPEARANCEUR CLEAR 01/19/2022 2322   LABSPEC 1.010 01/19/2022 2322   PHURINE 7.0 01/19/2022 2322   GLUCOSEU NEGATIVE 01/19/2022 2322   HGBUR NEGATIVE 01/19/2022 2322   BILIRUBINUR NEGATIVE 01/19/2022 2322   KETONESUR NEGATIVE 01/19/2022 2322  PROTEINUR NEGATIVE 01/19/2022 2322   NITRITE NEGATIVE 01/19/2022 2322   LEUKOCYTESUR NEGATIVE 01/19/2022 2322    Radiological Exams on Admission: I have personally reviewed images DG Femur Portable Min 2 Views Left  Result Date: 01/25/2023 CLINICAL DATA:  Fall, pain EXAM: LEFT FEMUR PORTABLE 2 VIEWS COMPARISON:  None Available. FINDINGS: AP view only demonstrates no acute bony abnormality. No visible fracture. No subluxation or dislocation. IMPRESSION: No acute bony abnormality. Electronically Signed   By: Charlett Nose M.D.   On: 01/25/2023 22:12   DG Femur Min 2 Views Right  Result Date: 01/25/2023 CLINICAL DATA:  Fall, right leg pain EXAM: RIGHT FEMUR 2 VIEWS COMPARISON:  Right hip series today FINDINGS: Right femoral neck fracture seen on right hip series. No additional femoral abnormality. IMPRESSION: Right femoral neck fracture arm hip series. No additional femoral abnormality. Electronically Signed   By: Charlett Nose M.D.   On: 01/25/2023 22:11   DG Pelvis Portable  Result Date: 01/25/2023 CLINICAL DATA:  Fall, right hip pain EXAM: PORTABLE PELVIS 1-2 VIEWS COMPARISON:  None available FINDINGS: There is a right femoral neck fracture with varus angulation. No subluxation or dislocation. Diffuse osteopenia. IMPRESSION: Right femoral neck fracture with varus angulation. Electronically Signed   By: Charlett Nose M.D.   On: 01/25/2023 22:10   DG Chest Portable 1 View  Result Date: 01/25/2023 CLINICAL DATA:  Fall EXAM: PORTABLE CHEST 1 VIEW COMPARISON:  None available FINDINGS: Heart and mediastinal contours within normal limits. Aortic atherosclerosis. No confluent opacities or effusions. No acute bony abnormality. IMPRESSION: No acute  cardiopulmonary disease Electronically Signed   By: Charlett Nose M.D.   On: 01/25/2023 22:10      Assessment/Plan: Principal Problem:   Closed displaced fracture of right femoral neck (HCC) Active Problems:   Fall at home, initial encounter   Essential hypertension   Hyperlipidemia   Chronic diastolic CHF (congestive heart failure) (HCC)   History of TIA (transient ischemic attack)   Dementia without behavioral disturbance (HCC)   History of CAD (coronary artery disease)   CKD (chronic kidney disease) stage 3, GFR 30-59 ml/min (HCC)    Assessment and Plan: Right femoral neck fracture -Patient presenting with a mechanical fall and head and sustained right-sided hip fracture. - At presentation to ED patient is hemodynamically stable. -CBC showing leukocytosis reactive in the setting of fall and fracture. -Imaging following, X-ray chest no acute cardiopulmonary process. X-ray pelvis right femoral neck fracture with varus angulation. X-ray right femur showed right femoral neck fracture  arm hip series. No additional femoral abnormality. X-ray left femur no acute bony abnormality. -ED physician consulted orthopedic surgeon Dr. Duwayne Heck recommended to keep patient n.p.o. midnight and will evaluate patient in the a.m.  Possibly patient will undergo hip arthroplasty. -Continue Tylenol as needed for mild pain, Norco as needed for moderate pain and fentanyl as needed for severe pain - Continue fall precaution. - Consulted inpatient PT and OT for evaluation after surgery. - Continue maintenance fluid D5 LR 75 cc/h for 1 day. - Keep patient n.p.o. to allow ice chips and meds with sips.  Mechanic fall at home -Patient presenting with mechanical fall at home.  Denies any loss of consciousness and hitting of the head. - At baseline patient uses walker at home with ambulation.  Consulted inpatient PT and OT for evaluation.  Essential hypertension Chronic diastolic heart failure with  preserved EF 65 to 70% -Continue ramipril 10 mg daily and hydrochlorothiazide 12.5 mg daily - Continue hydralazine  as needed.  History of CAD - Hold Plavix for possible surgical intervention in the a.m. -At home patient currently not on any beta-blocker. -Continue Lipitor 20 mg daily.  Hyperlipidemia -Continue Lipitor  CKD stage 3B -Creatinine 1.16 GFR 43.  Renal function at baseline. -Continue to monitor function, urine output and avoid nephrotoxic agent.  Chronic rhinitis/allergy - Continue singular   DVT prophylaxis:  SCDs Code Status:  DNR/DNI(Do NOT Intubate).  Reviewed ACP and verified with patient's son at the bedside Diet: Currently n.p.o. for surgical intervention in the a.m. Family Communication:  Family was present at bedside, at the time of interview. Opportunity was given to ask question and all questions were answered satisfactorily.  Disposition Plan: Tentative discharge to nursing home. Consults: PT and OT Admission status:   Inpatient, Telemetry bed  Severity of Illness: The appropriate patient status for this patient is INPATIENT. Inpatient status is judged to be reasonable and necessary in order to provide the required intensity of service to ensure the patient's safety. The patient's presenting symptoms, physical exam findings, and initial radiographic and laboratory data in the context of their chronic comorbidities is felt to place them at high risk for further clinical deterioration. Furthermore, it is not anticipated that the patient will be medically stable for discharge from the hospital within 2 midnights of admission.   * I certify that at the point of admission it is my clinical judgment that the patient will require inpatient hospital care spanning beyond 2 midnights from the point of admission due to high intensity of service, high risk for further deterioration and high frequency of surveillance required.Marland Kitchen    Tereasa Coop, MD [facility]  How to contact the [facility] Attending or Consulting provider 7A - 7P or covering provider during after hours 7P -7A, for this patient.  Check the care team in [facility] and look for a) attending/consulting [facility] provider listed and b) the [facility] team listed Log into [email or web address] and use [place]'s universal password to access. If you do not have the password, please contact the hospital operator. Locate the [facility] provider you are looking for under [facility] and page to a number that you can be directly reached. If you still have difficulty reaching the provider, please page the [facility] (Director on Call) for the Hospitalists listed on amion for assistance.  01/26/2023, 12:15 AM

## 2023-01-25 NOTE — Progress Notes (Signed)
Case dw EDP.  Will need arthroplasty surgeon to treat.  Consult note to come in am.  Keep NPO at [place].

## 2023-01-25 NOTE — ED Notes (Signed)
ED TO INPATIENT HANDOFF REPORT  ED Nurse Name and Phone #: Joneen Boers, Paramedic / [phone]  S Name/Age/Gender Brenda Lynch 89 y.o. female Room/Bed: 020C/020C  Code Status   Code Status: Prior  Home/SNF/Other Home Patient oriented to: self, place, and situation Is this baseline? Yes   Triage Complete: Triage complete  Chief Complaint Closed right hip fracture [facility]) [S72.001A]  Triage Note Arrives [facility] from home after an assisted fall while ambulating to restroom.   Son made notification with paramedics and said she was walking to restroom with walker and sometimes forgets she is walking and sits. He was able to assist her to the ground preventing a head injury but she landed on right hip.   Shortening of right leg without rotation. Holding onto right upper femur with swelling.   Compliant on Plavix   Allergies Allergies  Allergen Reactions   Ciprofloxacin Nausea Only and Other (See Comments)    GI intolerance  Other reaction(s): GI intol   Doxycycline Hyclate Other (See Comments)    BS elev, Head pressure    Penicillins Hives and Other (See Comments)    Welts, also Has patient had a PCN reaction causing immediate rash, facial/tongue/throat swelling, SOB or lightheadedness with hypotension: Yes Has patient had a PCN reaction causing severe rash involving mucus membranes or skin necrosis: No Has patient had a PCN reaction that required hospitalization No Has patient had a PCN reaction occurring within the last 10 years: No If all of the above answers are "NO", then may proceed with Cephalosporin use.   Penicillin G Hives and Other (See Comments)    Welts on the body, also     Level of Care/Admitting Diagnosis ED Disposition     ED Disposition  Admit   Condition  --   Comment  Hospital Area: [facility] [[id number]]  Level of Care: Telemetry Medical [104]  May admit patient to [facility] or [facility] if equivalent level of care  is available:: No  Covid Evaluation: Asymptomatic - no recent exposure (last 10 days) testing not required  Diagnosis: Closed right hip fracture [facility]) [[id number]]  Admitting Physician: Tereasa Coop [[id number]]  Attending Physician: Tereasa Coop [[id number]]  Certification:: I certify this patient will need inpatient services for at least 2 midnights  Expected Medical Readiness: 01/29/2023          B Medical/Surgery History Past Medical History:  Diagnosis Date   Allergic rhinitis    Chronic diastolic CHF (congestive heart failure) (HCC)    CKD (chronic kidney disease), stage II    Coronary artery disease    Diabetes mellitus    Diverticulosis    Drug-induced bradycardia 01/28/2016   Hypercholesterolemia    Hypertension    Hyponatremia    Meningioma (HCC)    Osteoarthritis    Sedentary lifestyle    Spinal stenosis    Vertigo, benign positional    Past Surgical History:  Procedure Laterality Date   BACK SURGERY     BREAST BIOPSY     SKIN FULL THICKNESS GRAFT Right 11/08/2017   Procedure: RIGHT SMALL FINGER ABLATION OF NAIL ANDSKIN GRAFT FULL THICKNESS;  Surgeon: Betha Loa, MD;  Location: [facility];  Service: Orthopedics;  Laterality: Right;   TONSILLECTOMY       A IV Location/Drains/Wounds Patient Lines/Drains/Airways Status     Active Line/Drains/Airways     Name Placement date Placement time Site Days   Peripheral IV 01/25/23 22 G 1" Anterior;Right Forearm 01/25/23  2114  Forearm  less than 1            Intake/Output Last 24 hours No intake or output data in the 24 hours ending 01/25/23 2230  Labs/Imaging Results for orders placed or performed during the hospital encounter of 01/25/23 (from the past 48 hour(s))  CBC with Differential     Status: Abnormal   Collection Time: 01/25/23  9:14 PM  Result Value Ref Range   WBC 11.9 (H) 4.0 - 10.5 K/uL   RBC 3.93 3.87 - 5.11 MIL/uL   Hemoglobin 11.7 (L) 12.0 - 15.0 g/dL   HCT 27.2 (L) 53.6 -  46.0 %   MCV 90.3 80.0 - 100.0 fL   MCH 29.8 26.0 - 34.0 pg   MCHC 33.0 30.0 - 36.0 g/dL   RDW 64.4 (H) 03.4 - 74.2 %   Platelets 238 150 - 400 K/uL   nRBC 0.0 0.0 - 0.2 %   Neutrophils Relative % 81 %   Neutro Abs 9.5 (H) 1.7 - 7.7 K/uL   Lymphocytes Relative 14 %   Lymphs Abs 1.7 0.7 - 4.0 K/uL   Monocytes Relative 5 %   Monocytes Absolute 0.6 0.1 - 1.0 K/uL   Eosinophils Relative 0 %   Eosinophils Absolute 0.0 0.0 - 0.5 K/uL   Basophils Relative 0 %   Basophils Absolute 0.0 0.0 - 0.1 K/uL   Immature Granulocytes 0 %   Abs Immature Granulocytes 0.05 0.00 - 0.07 K/uL    Comment: Performed at [facility], [address], [place], [place] [address]  Basic metabolic panel     Status: Abnormal   Collection Time: 01/25/23  9:14 PM  Result Value Ref Range   Sodium 132 (L) 135 - 145 mmol/L   Potassium 4.1 3.5 - 5.1 mmol/L   Chloride 99 98 - 111 mmol/L   CO2 23 22 - 32 mmol/L   Glucose, Bld 162 (H) 70 - 99 mg/dL    Comment: Glucose reference range applies only to samples taken after fasting for at least 8 hours.   BUN 17 8 - 23 mg/dL   Creatinine, Ser 8.75 (H) 0.44 - 1.00 mg/dL   Calcium 9.2 8.9 - 64.3 mg/dL   GFR, Estimated 43 (L) >60 mL/min    Comment: (NOTE) Calculated using the CKD-EPI Creatinine Equation (2021)    Anion gap 10 5 - 15    Comment: Performed at [facility], [address], [place], [place] [address]   DG Femur Portable Min 2 Views Left  Result Date: 01/25/2023 CLINICAL DATA:  Fall, pain EXAM: LEFT FEMUR PORTABLE 2 VIEWS COMPARISON:  None Available. FINDINGS: AP view only demonstrates no acute bony abnormality. No visible fracture. No subluxation or dislocation. IMPRESSION: No acute bony abnormality. Electronically Signed   By: Charlett Nose M.D.   On: 01/25/2023 22:12   DG Femur Min 2 Views Right  Result Date: 01/25/2023 CLINICAL DATA:  Fall, right leg pain EXAM: RIGHT FEMUR 2 VIEWS COMPARISON:  Right hip series today FINDINGS: Right femoral neck  fracture seen on right hip series. No additional femoral abnormality. IMPRESSION: Right femoral neck fracture arm hip series. No additional femoral abnormality. Electronically Signed   By: Charlett Nose M.D.   On: 01/25/2023 22:11   DG Pelvis Portable  Result Date: 01/25/2023 CLINICAL DATA:  Fall, right hip pain EXAM: PORTABLE PELVIS 1-2 VIEWS COMPARISON:  None available FINDINGS: There is a right femoral neck fracture with varus angulation. No subluxation  or dislocation. Diffuse osteopenia. IMPRESSION: Right femoral neck fracture with varus angulation. Electronically Signed   By: Charlett Nose M.D.   On: 01/25/2023 22:10   DG Chest Portable 1 View  Result Date: 01/25/2023 CLINICAL DATA:  Fall EXAM: PORTABLE CHEST 1 VIEW COMPARISON:  None available FINDINGS: Heart and mediastinal contours within normal limits. Aortic atherosclerosis. No confluent opacities or effusions. No acute bony abnormality. IMPRESSION: No acute cardiopulmonary disease Electronically Signed   By: Charlett Nose M.D.   On: 01/25/2023 22:10    Pending Labs Unresulted Labs (From admission, onward)    None       Vitals/Pain Today's Vitals   01/25/23 1956 01/25/23 2045  BP:  (!) 145/93  Pulse:  72  Resp:  15  Temp:  98.9 F (37.2 C)  TempSrc:  Oral  SpO2:  98%  Weight: 105 lb (47.6 kg)   Height: 5\' 5"  (1.651 m)     Isolation Precautions No active isolations  Medications Medications  fentaNYL (SUBLIMAZE) injection 12.5 mcg (12.5 mcg Intravenous Given 01/25/23 2120)    Mobility Non ambulatory      Focused Assessments Cardiac Assessment Handoff:    Lab Results  Component Value Date   CKTOTAL 52 08/01/2020   Lab Results  Component Value Date   DDIMER 2.36 (H) 10/10/2019   Does the Patient currently have chest pain? No    R Recommendations: See Admitting Provider Note  Report given to:   Additional Notes: PT has family at bedside.

## 2023-01-25 NOTE — ED Triage Notes (Addendum)
Arrives [facility] from home after an assisted fall while ambulating to restroom.   Son made notification with paramedics and said she was walking to restroom with walker and sometimes forgets she is walking and sits. He was able to assist her to the ground preventing a head injury but she landed on right hip.   Shortening of right leg without rotation. Holding onto right upper femur with swelling.   Compliant on Plavix

## 2023-01-25 NOTE — ED Notes (Signed)
This writer attempted to call report to floor to notify of PT arrival after 40 minutes of bed being marked ready. No answer, PT being moved off floor at this time.

## 2023-01-25 NOTE — ED Provider Notes (Signed)
[place] [facility]ER[facility] DEPARTMENT AT [facility] Provider Note   CSN: [id number] Arrival date & time: 01/25/23  1952     History  Chief Complaint  Patient presents with   Fall    Brenda Lynch is a 89 y.o. female.  The history is provided by the patient and medical records. No language interpreter was used.  Fall This is a new problem. The current episode started less than 1 hour ago. The problem occurs constantly. The problem has not changed since onset.Pertinent negatives include no chest pain, no abdominal pain, no headaches and no shortness of breath. Nothing aggravates the symptoms. Nothing relieves the symptoms. She has tried nothing for the symptoms. The treatment provided no relief.       Home Medications Prior to Admission medications   Medication Sig Start Date End Date Taking? Authorizing Provider  acetaminophen (TYLENOL) 500 MG tablet Take 500 mg by mouth every 6 (six) hours as needed (for headaches or mild pain).    [provider]  ALPRAZolam Prudy Feeler) 0.25 MG tablet Take 1 tablet by mouth 2 (two) times daily as needed.    [provider]  atorvastatin (LIPITOR) 20 MG tablet Take 1 tablet by mouth daily.    [provider]  calcium carbonate (TUMS - DOSED IN MG ELEMENTAL CALCIUM) 500 MG chewable tablet Chew 1 tablet by mouth daily as needed for indigestion or heartburn.    [provider]  cephALEXin (KEFLEX) 500 MG capsule Take 500 mg by mouth 2 (two) times daily. 11/01/21   [provider]  Cholecalciferol (VITAMIN D3) 25 MCG (1000 UT) CAPS Take 1,000 Units by mouth in the morning.    [provider]  clopidogrel (PLAVIX) 75 MG tablet Take 1 tablet by mouth daily.    [provider]  fluticasone (FLONASE) 50 MCG/ACT nasal spray Place 1-2 sprays into both nostrils daily as needed for allergies or rhinitis.    [provider]  hydrochlorothiazide (MICROZIDE) 12.5 MG capsule TAKE ONE CAPSULE  BY MOUTH ONCE DAILY 04/17/22   Quintella Reichert, MD  hydrOXYzine (ATARAX) 25 MG tablet Take 25 mg by mouth at bedtime as needed for anxiety.    [provider]  loperamide (IMODIUM A-D) 2 MG tablet Take 2 mg by mouth 4 (four) times daily as needed for diarrhea or loose stools.    [provider]  Melatonin 10 MG TABS Take 10 mg by mouth at bedtime as needed (for sleep).    [provider]  Misc Natural Products (OSTEO BI-FLEX ADV DOUBLE ST PO) Take 1 tablet by mouth daily.    [provider]  montelukast (SINGULAIR) 10 MG tablet Take 10 mg by mouth at bedtime. 12/12/12   [provider]  ondansetron (ZOFRAN) 4 MG tablet Take 4 mg by mouth daily as needed for nausea or vomiting. 09/27/20   [provider]  Polyethyl Glyc-Propyl Glyc PF (SYSTANE HYDRATION PF) 0.4-0.3 % SOLN Place 1 drop into both eyes 3 (three) times daily as needed (for irritation).    [provider]  polyethylene glycol powder (GLYCOLAX/MIRALAX) 17 GM/SCOOP powder Take 8.5-17 g by mouth daily as needed for mild constipation (MIX AND DRINK).    [provider]  potassium chloride (MICRO-K) 10 MEQ CR capsule Take 10 mEq by mouth daily.    [provider]  PRILOSEC OTC 20 MG tablet Take 20 mg by mouth daily before breakfast.    [provider]  ramipril (ALTACE) 10  MG capsule Take 10 mg by mouth daily.    [provider]  Saline (SIMPLY SALINE) 0.9 % AERS Place 1 spray into both nostrils as needed (for congestion).    [provider]      Allergies    Ciprofloxacin, Doxycycline hyclate, Penicillins, and Penicillin g    Review of Systems   Review of Systems  Constitutional:  Negative for chills, fatigue and fever.  HENT:  Negative for congestion.   Respiratory:  Negative for cough, chest tightness and shortness of breath.   Cardiovascular:  Negative for chest pain.  Gastrointestinal:  Negative for abdominal pain, constipation,  diarrhea, nausea and vomiting.  Genitourinary:  Negative for dysuria.  Musculoskeletal:  Negative for back pain, neck pain and neck stiffness.  Skin:  Negative for rash and wound.  Neurological:  Negative for weakness, light-headedness, numbness and headaches.  Psychiatric/Behavioral:  Negative for agitation and confusion.   All other systems reviewed and are negative.   Physical Exam Updated Vital Signs Ht 5\' 5"  (1.651 m)   Wt 47.6 kg   BMI 17.47 kg/m  Physical Exam Vitals and nursing note reviewed.  Constitutional:      General: She is not in acute distress.    Appearance: She is well-developed. She is not ill-appearing, toxic-appearing or diaphoretic.  HENT:     Head: Normocephalic and atraumatic.     Right Ear: External ear normal.     Left Ear: External ear normal.     Nose: Nose normal.     Mouth/Throat:     Pharynx: No oropharyngeal exudate.  Eyes:     Conjunctiva/sclera: Conjunctivae normal.     Pupils: Pupils are equal, round, and reactive to light.  Cardiovascular:     Rate and Rhythm: Normal rate.  Pulmonary:     Effort: No respiratory distress.     Breath sounds: No stridor. No wheezing, rhonchi or rales.  Chest:     Chest wall: No tenderness.  Abdominal:     General: There is no distension.     Tenderness: There is no abdominal tenderness. There is no right CVA tenderness, left CVA tenderness, guarding or rebound.  Musculoskeletal:        General: Tenderness, deformity and signs of injury present.     Cervical back: Normal range of motion and neck supple. No tenderness.     Right hip: Deformity, tenderness and bony tenderness present. No lacerations.       Legs:     Comments: Intact sensation, strength, and pulse distally.  Deformity of the right hip.  Tenderness present.  Skin:    General: Skin is warm.     Findings: No erythema or rash.  Neurological:     General: No focal deficit present.     Mental Status: She is alert and oriented to person,  place, and time.     Sensory: No sensory deficit.     Motor: No weakness or abnormal muscle tone.     Deep Tendon Reflexes: Reflexes are normal and symmetric.     ED Results / Procedures / Treatments   Labs (all labs ordered are listed, but only abnormal results are displayed) Labs Reviewed  CBC WITH DIFFERENTIAL/PLATELET - Abnormal; Notable for the following components:      Result Value   WBC 11.9 (*)    Hemoglobin 11.7 (*)    HCT 35.5 (*)    RDW 16.9 (*)    Neutro Abs 9.5 (*)    All other  components within normal limits  BASIC METABOLIC PANEL - Abnormal; Notable for the following components:   Sodium 132 (*)    Glucose, Bld 162 (*)    Creatinine, Ser 1.16 (*)    GFR, Estimated 43 (*)    All other components within normal limits  CBC  COMPREHENSIVE METABOLIC PANEL    EKG None  Radiology DG Femur Portable Min 2 Views Left  Result Date: 01/25/2023 CLINICAL DATA:  Fall, pain EXAM: LEFT FEMUR PORTABLE 2 VIEWS COMPARISON:  None Available. FINDINGS: AP view only demonstrates no acute bony abnormality. No visible fracture. No subluxation or dislocation. IMPRESSION: No acute bony abnormality. Electronically Signed   By: Charlett Nose M.D.   On: 01/25/2023 22:12   DG Femur Min 2 Views Right  Result Date: 01/25/2023 CLINICAL DATA:  Fall, right leg pain EXAM: RIGHT FEMUR 2 VIEWS COMPARISON:  Right hip series today FINDINGS: Right femoral neck fracture seen on right hip series. No additional femoral abnormality. IMPRESSION: Right femoral neck fracture arm hip series. No additional femoral abnormality. Electronically Signed   By: Charlett Nose M.D.   On: 01/25/2023 22:11   DG Pelvis Portable  Result Date: 01/25/2023 CLINICAL DATA:  Fall, right hip pain EXAM: PORTABLE PELVIS 1-2 VIEWS COMPARISON:  None available FINDINGS: There is a right femoral neck fracture with varus angulation. No subluxation or dislocation. Diffuse osteopenia. IMPRESSION: Right femoral neck fracture with varus  angulation. Electronically Signed   By: Charlett Nose M.D.   On: 01/25/2023 22:10   DG Chest Portable 1 View  Result Date: 01/25/2023 CLINICAL DATA:  Fall EXAM: PORTABLE CHEST 1 VIEW COMPARISON:  None available FINDINGS: Heart and mediastinal contours within normal limits. Aortic atherosclerosis. No confluent opacities or effusions. No acute bony abnormality. IMPRESSION: No acute cardiopulmonary disease Electronically Signed   By: Charlett Nose M.D.   On: 01/25/2023 22:10    Procedures Procedures    Medications Ordered in ED Medications  atorvastatin (LIPITOR) tablet 20 mg (has no administration in time range)  hydrochlorothiazide (HYDRODIURIL) tablet 12.5 mg (has no administration in time range)  ramipril (ALTACE) capsule 10 mg (has no administration in time range)  busPIRone (BUSPAR) tablet 5 mg (has no administration in time range)  pantoprazole (PROTONIX) EC tablet 40 mg (has no administration in time range)  montelukast (SINGULAIR) tablet 10 mg (has no administration in time range)  dextrose 5 % in lactated ringers infusion (has no administration in time range)  acetaminophen (TYLENOL) tablet 650 mg (has no administration in time range)    Or  acetaminophen (TYLENOL) suppository 650 mg (has no administration in time range)  HYDROcodone-acetaminophen (NORCO/VICODIN) 5-325 MG per tablet 1-2 tablet (has no administration in time range)  fentaNYL (SUBLIMAZE) injection 12.5-50 mcg (has no administration in time range)  senna-docusate (Senokot-S) tablet 1 tablet (has no administration in time range)  ondansetron (ZOFRAN) tablet 4 mg (has no administration in time range)    Or  ondansetron (ZOFRAN) injection 4 mg (has no administration in time range)  busPIRone (BUSPAR) tablet 10 mg (has no administration in time range)  fentaNYL (SUBLIMAZE) injection 12.5 mcg (12.5 mcg Intravenous Given 01/25/23 2120)    ED Course/ Medical Decision Making/ A&P                                 Medical  Decision Making Risk Prescription drug management. Decision regarding hospitalization.    Kitana B Szilagyi is a  89 y.o. female with a past medical history significant for hypertension, hypercholesterolemia, CAD, vertigo, CKD, CHF, and diabetes who presents with hip deformity and fall.  According to patient's family, she was getting into the bathroom where it is too narrow for her walker to get to so the son was helping hold her when she fell to the ground hitting her right hip on the ground.  She did not hit her head.  She is on Plavix.  She is denying any headache or neck pain or chest pain or abdominal pain but has pain in the right hip.  She could not bear weight and could not walk.  She normally gets around with a walker fairly well.  Denying other pain at this time.  On exam, patient has deformity and tenderness to her right hip.  She has intact pulse and has sensation.  She can wiggle her foot.  No other injury seen on initial exam.  Lungs clear.  Chest nontender.  Abdomen nontender.  X-ray was obtained and I looked at the images on the x-ray machine and does show evidence of a femoral neck fracture.  Do not see dislocation.  Will call orthopedics and will call medicine for admission.  She will have other screening labs, EKG, chest x-ray but anticipate medical admission to discuss possible surgical management.  Spoke to Dr. Aundria Rud orthopedics who recommended n.p.o. and his team will see her in the morning.      Patient will be mated to medicine for further management of hip fracture.         Final Clinical Impression(s) / ED Diagnoses Final diagnoses:  Fall, initial encounter  Closed fracture of right hip, initial encounter (HCC)     Clinical Impression: 1. Fall, initial encounter   2. Closed fracture of right hip, initial encounter [facility])     Disposition: Admit  This note was prepared with assistance of Dragon voice recognition software. Occasional wrong-word or  sound-a-like substitutions may have occurred due to the inherent limitations of voice recognition software.     Eris Hannan, Canary Brim, MD 01/25/23 [phone]

## 2023-01-26 ENCOUNTER — Inpatient Hospital Stay (HOSPITAL_COMMUNITY): Payer: Medicare PPO

## 2023-01-26 DIAGNOSIS — I5032 Chronic diastolic (congestive) heart failure: Secondary | ICD-10-CM | POA: Diagnosis not present

## 2023-01-26 DIAGNOSIS — S72001A Fracture of unspecified part of neck of right femur, initial encounter for closed fracture: Secondary | ICD-10-CM | POA: Diagnosis not present

## 2023-01-26 DIAGNOSIS — G459 Transient cerebral ischemic attack, unspecified: Secondary | ICD-10-CM | POA: Diagnosis not present

## 2023-01-26 DIAGNOSIS — N1832 Chronic kidney disease, stage 3b: Secondary | ICD-10-CM | POA: Diagnosis not present

## 2023-01-26 LAB — COMPREHENSIVE METABOLIC PANEL
ALT: 20 U/L (ref 0–44)
AST: 21 U/L (ref 15–41)
Albumin: 3.2 g/dL — ABNORMAL LOW (ref 3.5–5.0)
Alkaline Phosphatase: 86 U/L (ref 38–126)
Anion gap: 13 (ref 5–15)
BUN: 15 mg/dL (ref 8–23)
CO2: 21 mmol/L — ABNORMAL LOW (ref 22–32)
Calcium: 8.7 mg/dL — ABNORMAL LOW (ref 8.9–10.3)
Chloride: 100 mmol/L (ref 98–111)
Creatinine, Ser: 1.08 mg/dL — ABNORMAL HIGH (ref 0.44–1.00)
GFR, Estimated: 47 mL/min — ABNORMAL LOW (ref >60)
Glucose, Bld: 144 mg/dL — ABNORMAL HIGH (ref 70–99)
Potassium: 4.4 mmol/L (ref 3.5–5.1)
Sodium: 134 mmol/L — ABNORMAL LOW (ref 135–145)
Total Bilirubin: 0.8 mg/dL (ref <1.2)
Total Protein: 6.7 g/dL (ref 6.5–8.1)

## 2023-01-26 LAB — TYPE AND SCREEN
ABO/RH(D): B POS
Antibody Screen: NEGATIVE

## 2023-01-26 LAB — SURGICAL PCR SCREEN
MRSA, PCR: NEGATIVE
Staphylococcus aureus: NEGATIVE

## 2023-01-26 LAB — CBC
HCT: 34.2 % — ABNORMAL LOW (ref 36.0–46.0)
Hemoglobin: 12.1 g/dL (ref 12.0–15.0)
MCH: 31.1 pg (ref 26.0–34.0)
MCHC: 35.4 g/dL (ref 30.0–36.0)
MCV: 87.9 fL (ref 80.0–100.0)
Platelets: 248 10*3/uL (ref 150–400)
RBC: 3.89 MIL/uL (ref 3.87–5.11)
RDW: 16.8 % — ABNORMAL HIGH (ref 11.5–15.5)
WBC: 10.3 10*3/uL (ref 4.0–10.5)
nRBC: 0 % (ref 0.0–0.2)

## 2023-01-26 MED ORDER — ATORVASTATIN CALCIUM 10 MG PO TABS
20.0000 mg | ORAL_TABLET | Freq: Every day | ORAL | Status: DC
  Administered 2023-01-27 – 2023-01-29 (×3): 20 mg via ORAL
  Filled 2023-01-26 (×3): qty 2

## 2023-01-26 MED ORDER — FENTANYL CITRATE PF 50 MCG/ML IJ SOSY
12.5000 ug | PREFILLED_SYRINGE | INTRAMUSCULAR | Status: DC | PRN
  Administered 2023-01-26: 12.5 ug via INTRAVENOUS
  Filled 2023-01-26: qty 1

## 2023-01-26 MED ORDER — HYDRALAZINE HCL 20 MG/ML IJ SOLN: 5.0000 mg | Freq: Four times a day (QID) | INTRAMUSCULAR | Status: DC | PRN

## 2023-01-26 MED ORDER — HYDROCODONE-ACETAMINOPHEN 5-325 MG PO TABS
1.0000 | ORAL_TABLET | ORAL | Status: DC | PRN
  Filled 2023-01-26: qty 1

## 2023-01-26 NOTE — H&P (View-Only) (Signed)
Reason for Consult:Right hip fx Referring Physician: Mauro Kaufmann Time called: 0730 Time at bedside: 0905   Brenda Lynch is an 89 y.o. female.  HPI: Brenda Lynch was at home and fell. She had immediate right hip pain and could not get up. She was brought to the ED where x-rays showed a right femoral neck fx and orthopedic surgery was consulted. She lives at home and ambulates with a RW.  Past Medical History:  Diagnosis Date   Allergic rhinitis    Chronic diastolic CHF (congestive heart failure) (HCC)    CKD (chronic kidney disease), stage II    Coronary artery disease    Diabetes mellitus    Diverticulosis    Drug-induced bradycardia 01/28/2016   Hypercholesterolemia    Hypertension    Hyponatremia    Meningioma (HCC)    Osteoarthritis    Sedentary lifestyle    Spinal stenosis    Vertigo, benign positional     Past Surgical History:  Procedure Laterality Date   BACK SURGERY     BREAST BIOPSY     SKIN FULL THICKNESS GRAFT Right 11/08/2017   Procedure: RIGHT SMALL FINGER ABLATION OF NAIL ANDSKIN GRAFT FULL THICKNESS;  Surgeon: Betha Loa, MD;  Location: [facility];  Service: Orthopedics;  Laterality: Right;   TONSILLECTOMY      Family History  Problem Relation Age of Onset   Heart disease Mother    Heart attack Mother    Prostate cancer Father    Lung cancer Brother    Pancreatic cancer Brother    Headache Neg Hx     Social History:  reports that she quit smoking about 44 years ago. Her smoking use included cigarettes. She has never used smokeless tobacco. She reports that she does not drink alcohol and does not use drugs.  Allergies:  Allergies  Allergen Reactions   Ciprofloxacin Nausea Only and Other (See Comments)    GI intolerance  Other reaction(s): GI intol   Doxycycline Hyclate Other (See Comments)    BS elev, Head pressure    Penicillins Hives and Other (See Comments)    Welts, also Has patient had a PCN reaction causing immediate rash,  facial/tongue/throat swelling, SOB or lightheadedness with hypotension: Yes Has patient had a PCN reaction causing severe rash involving mucus membranes or skin necrosis: No Has patient had a PCN reaction that required hospitalization No Has patient had a PCN reaction occurring within the last 10 years: No If all of the above answers are "NO", then may proceed with Cephalosporin use.   Penicillin G Hives and Other (See Comments)    Welts on the body, also     Medications: I have reviewed the patient's current medications.  Results for orders placed or performed during the hospital encounter of 01/25/23 (from the past 48 hour(s))  CBC with Differential     Status: Abnormal   Collection Time: 01/25/23  9:14 PM  Result Value Ref Range   WBC 11.9 (H) 4.0 - 10.5 K/uL   RBC 3.93 3.87 - 5.11 MIL/uL   Hemoglobin 11.7 (L) 12.0 - 15.0 g/dL   HCT 45.4 (L) 09.8 - 11.9 %   MCV 90.3 80.0 - 100.0 fL   MCH 29.8 26.0 - 34.0 pg   MCHC 33.0 30.0 - 36.0 g/dL   RDW 14.7 (H) 82.9 - 56.2 %   Platelets 238 150 - 400 K/uL   nRBC 0.0 0.0 - 0.2 %   Neutrophils Relative % 81 %  Neutro Abs 9.5 (H) 1.7 - 7.7 K/uL   Lymphocytes Relative 14 %   Lymphs Abs 1.7 0.7 - 4.0 K/uL   Monocytes Relative 5 %   Monocytes Absolute 0.6 0.1 - 1.0 K/uL   Eosinophils Relative 0 %   Eosinophils Absolute 0.0 0.0 - 0.5 K/uL   Basophils Relative 0 %   Basophils Absolute 0.0 0.0 - 0.1 K/uL   Immature Granulocytes 0 %   Abs Immature Granulocytes 0.05 0.00 - 0.07 K/uL    Comment: Performed at [facility], [address], [place], [place] [address]  Basic metabolic panel     Status: Abnormal   Collection Time: 01/25/23  9:14 PM  Result Value Ref Range   Sodium 132 (L) 135 - 145 mmol/L   Potassium 4.1 3.5 - 5.1 mmol/L   Chloride 99 98 - 111 mmol/L   CO2 23 22 - 32 mmol/L   Glucose, Bld 162 (H) 70 - 99 mg/dL    Comment: Glucose reference range applies only to samples taken after fasting for at least 8 hours.   BUN 17  8 - 23 mg/dL   Creatinine, Ser 8.41 (H) 0.44 - 1.00 mg/dL   Calcium 9.2 8.9 - 32.4 mg/dL   GFR, Estimated 43 (L) >60 mL/min    Comment: (NOTE) Calculated using the CKD-EPI Creatinine Equation (2021)    Anion gap 10 5 - 15    Comment: Performed at [facility], [address] [place], [place] [address]  CBC     Status: Abnormal   Collection Time: 01/26/23  6:17 AM  Result Value Ref Range   WBC 10.3 4.0 - 10.5 K/uL   RBC 3.89 3.87 - 5.11 MIL/uL   Hemoglobin 12.1 12.0 - 15.0 g/dL   HCT 72.5 (L) 36.6 - 44.0 %   MCV 87.9 80.0 - 100.0 fL   MCH 31.1 26.0 - 34.0 pg   MCHC 35.4 30.0 - 36.0 g/dL   RDW 34.7 (H) 42.5 - 95.6 %   Platelets 248 150 - 400 K/uL   nRBC 0.0 0.0 - 0.2 %    Comment: Performed at [facility], [address] [place], [place] [address]  Comprehensive metabolic panel     Status: Abnormal   Collection Time: 01/26/23  6:17 AM  Result Value Ref Range   Sodium 134 (L) 135 - 145 mmol/L   Potassium 4.4 3.5 - 5.1 mmol/L   Chloride 100 98 - 111 mmol/L   CO2 21 (L) 22 - 32 mmol/L   Glucose, Bld 144 (H) 70 - 99 mg/dL    Comment: Glucose reference range applies only to samples taken after fasting for at least 8 hours.   BUN 15 8 - 23 mg/dL   Creatinine, Ser 4.33 (H) 0.44 - 1.00 mg/dL   Calcium 8.7 (L) 8.9 - 10.3 mg/dL   Total Protein 6.7 6.5 - 8.1 g/dL   Albumin 3.2 (L) 3.5 - 5.0 g/dL   AST 21 15 - 41 U/L   ALT 20 0 - 44 U/L   Alkaline Phosphatase 86 38 - 126 U/L   Total Bilirubin 0.8 <1.2 mg/dL   GFR, Estimated 47 (L) >60 mL/min    Comment: (NOTE) Calculated using the CKD-EPI Creatinine Equation (2021)    Anion gap 13 5 - 15    Comment: Performed at [facility], [address], [place], [place] [address]    DG Femur Portable Min 2 Views Left  Result  Date: 01/25/2023 CLINICAL DATA:  Fall, pain EXAM: LEFT FEMUR PORTABLE 2 VIEWS COMPARISON:  None Available. FINDINGS: AP view only demonstrates no acute bony abnormality. No visible fracture. No  subluxation or dislocation. IMPRESSION: No acute bony abnormality. Electronically Signed   By: Charlett Nose M.D.   On: 01/25/2023 22:12   DG Femur Min 2 Views Right  Result Date: 01/25/2023 CLINICAL DATA:  Fall, right leg pain EXAM: RIGHT FEMUR 2 VIEWS COMPARISON:  Right hip series today FINDINGS: Right femoral neck fracture seen on right hip series. No additional femoral abnormality. IMPRESSION: Right femoral neck fracture arm hip series. No additional femoral abnormality. Electronically Signed   By: Charlett Nose M.D.   On: 01/25/2023 22:11   DG Pelvis Portable  Result Date: 01/25/2023 CLINICAL DATA:  Fall, right hip pain EXAM: PORTABLE PELVIS 1-2 VIEWS COMPARISON:  None available FINDINGS: There is a right femoral neck fracture with varus angulation. No subluxation or dislocation. Diffuse osteopenia. IMPRESSION: Right femoral neck fracture with varus angulation. Electronically Signed   By: Charlett Nose M.D.   On: 01/25/2023 22:10   DG Chest Portable 1 View  Result Date: 01/25/2023 CLINICAL DATA:  Fall EXAM: PORTABLE CHEST 1 VIEW COMPARISON:  None available FINDINGS: Heart and mediastinal contours within normal limits. Aortic atherosclerosis. No confluent opacities or effusions. No acute bony abnormality. IMPRESSION: No acute cardiopulmonary disease Electronically Signed   By: Charlett Nose M.D.   On: 01/25/2023 22:10    Review of Systems  Unable to perform ROS: Other   Blood pressure 129/70, pulse 81, temperature 97.7 F (36.5 C), temperature source Axillary, resp. rate 16, height 5\' 5"  (1.651 m), weight 47.6 kg, SpO2 97%. Physical Exam Constitutional:      General: She is not in acute distress.    Appearance: She is well-developed. She is not diaphoretic.  HENT:     Head: Normocephalic and atraumatic.  Eyes:     General:        Right eye: No discharge.        Left eye: No discharge.  Cardiovascular:     Rate and Rhythm: Normal rate and regular rhythm.  Pulmonary:     Effort:  Pulmonary effort is normal. No respiratory distress.  Musculoskeletal:     Cervical back: Normal range of motion.     Comments: RLE No traumatic wounds, ecchymosis, or rash  No knee or ankle effusion  Knee stable to varus/ valgus and anterior/posterior stress  Sens DPN, SPN, TN could not assess  Motor EHL, ext, flex, evers could not assess  DP 0, PT 0, No significant edema  Skin:    General: Skin is warm and dry.     Assessment/Plan: Right hip fx -- Plan hip hemi tomorrow with Dr. Linna Caprice. Please keep NPO after MN. Multiple medical problems including diastolic heart failure preserved EF 65 to 70%, essential hypertension, transient ischemic attack, DM type II, hyperlipidemia, dementia, CAD, and CKD stage IIIb -- per primary service    Freeman Caldron, PA-C Orthopedic Surgery [phone] 01/26/2023, 9:37 AM

## 2023-01-26 NOTE — H&P (Incomplete)
History and Physical    Brenda Lynch WUJ:[id number] DOB: Jun 25, 1923 DOA: 01/25/2023  PCP: Georgann Housekeeper, MD   Patient coming from: Home   Chief Complaint:  Chief Complaint  Patient presents with  . Fall   ED TRIAGE note:Arrives [facility] from home after an assisted fall while ambulating to restroom.  son made notification with paramedics and said she was walking to restroom with walker and sometimes forgets she is walking and sits. He was able to assist her to the ground preventing a head injury but she landed on right hip.  Shortening of right leg without rotation. Holding onto right upper femur with swelling.   HPI:  Brenda Lynch is a 89 y.o. female with medical history significant of diastolic heart failure preserved EF 65 to 70%, essential hypertension, transient ischemic attack, DM type II, hyperlipidemia, dementia, CAD, and CKD stage IIIb presented to emergency department for evaluation for a mechanical fall.  Patient reported she had a fall while ambulating to her bathroom landed on her right-sided hip.  Patient denies any head injury and loss of consciousness.   ED Course:  At presentation to ED patient heart rate 72, respiratory rate 15, blood pressure 145/93 O2 sat 98% room air. CBC showing leukocytosis 11.9 and stable H&H 11.7 and 35 and platelet count 238. BMP showing slight low sodium 132, blood glucose 162, creatinine 1.16 which is around baseline and GFR 43.  X-ray chest no acute cardiopulmonary process. X-ray pelvis right femoral neck fracture with varus angulation. X-ray right femur showed right femoral neck fracture  arm hip series. No additional femoral abnormality. X-ray left femur no acute bony abnormality.  In the ED patient received fentanyl 12.5 mg one-time dose. ED physician consulted and spoke with orthopedic surgeon Dr. Duwayne Heck recommended keep patient n.p.o. and will evaluate patient soon.  Hospitalist has been contacted for further evaluation  management of mechanical fall and right-sided hip fracture.  Review of Systems:  ROS  Past Medical History:  Diagnosis Date  . Allergic rhinitis   . Chronic diastolic CHF (congestive heart failure) (HCC)   . CKD (chronic kidney disease), stage II   . Coronary artery disease   . Diabetes mellitus   . Diverticulosis   . Drug-induced bradycardia 01/28/2016  . Hypercholesterolemia   . Hypertension   . Hyponatremia   . Meningioma (HCC)   . Osteoarthritis   . Sedentary lifestyle   . Spinal stenosis   . Vertigo, benign positional     Past Surgical History:  Procedure Laterality Date  . BACK SURGERY    . BREAST BIOPSY    . SKIN FULL THICKNESS GRAFT Right 11/08/2017   Procedure: RIGHT SMALL FINGER ABLATION OF NAIL ANDSKIN GRAFT FULL THICKNESS;  Surgeon: Betha Loa, MD;  Location: [facility];  Service: Orthopedics;  Laterality: Right;  . TONSILLECTOMY       reports that she quit smoking about 44 years ago. Her smoking use included cigarettes. She has never used smokeless tobacco. She reports that she does not drink alcohol and does not use drugs.  Allergies  Allergen Reactions  . Ciprofloxacin Nausea Only and Other (See Comments)    GI intolerance  Other reaction(s): GI intol  . Doxycycline Hyclate Other (See Comments)    BS elev, Head pressure   . Penicillins Hives and Other (See Comments)    Welts, also Has patient had a PCN reaction causing immediate rash, facial/tongue/throat swelling, SOB or lightheadedness with hypotension: Yes Has patient had a  PCN reaction causing severe rash involving mucus membranes or skin necrosis: No Has patient had a PCN reaction that required hospitalization No Has patient had a PCN reaction occurring within the last 10 years: No If all of the above answers are "NO", then may proceed with Cephalosporin use.  Marland Kitchen Penicillin G Hives and Other (See Comments)    Welts on the body, also     Family History  Problem Relation Age of  Onset  . Heart disease Mother   . Heart attack Mother   . Prostate cancer Father   . Lung cancer Brother   . Pancreatic cancer Brother   . Headache Neg Hx     Prior to Admission medications   Medication Sig Start Date End Date Taking? Authorizing Provider  acetaminophen (TYLENOL) 500 MG tablet Take 500 mg by mouth every 6 (six) hours as needed (for headaches or mild pain).   Yes [provider]  atorvastatin (LIPITOR) 20 MG tablet Take 1 tablet by mouth daily.   Yes [provider]  busPIRone (BUSPAR) 5 MG tablet Take 5-10 mg by mouth See admin instructions. Take 1 tablet by mouth in the morning and 2 tablets at bedtime 08/09/22  Yes [provider]  Cholecalciferol (VITAMIN D3) 25 MCG (1000 UT) CAPS Take 1,000 Units by mouth in the morning.   Yes [provider]  clopidogrel (PLAVIX) 75 MG tablet Take 1 tablet by mouth daily.   Yes [provider]  hydrochlorothiazide (MICROZIDE) 12.5 MG capsule TAKE ONE CAPSULE BY MOUTH ONCE DAILY 04/17/22  Yes Turner, Cornelious Bryant, MD  Misc Natural Products (OSTEO BI-FLEX ADV DOUBLE ST PO) Take 1 tablet by mouth daily.   Yes [provider]  montelukast (SINGULAIR) 10 MG tablet Take 10 mg by mouth at bedtime. 12/12/12  Yes [provider]  Polyethyl Glyc-Propyl Glyc PF (SYSTANE HYDRATION PF) 0.4-0.3 % SOLN Place 1 drop into both eyes 3 (three) times daily as needed (for irritation).   Yes [provider]  potassium chloride (MICRO-K) 10 MEQ CR capsule Take 10 mEq by mouth daily.   Yes [provider]  PRILOSEC OTC 20 MG tablet Take 20 mg by mouth daily before breakfast.   Yes [provider]  ramipril (ALTACE) 10 MG capsule Take 10 mg by mouth daily.   Yes [provider]  nitrofurantoin (MACRODANTIN) 50 MG capsule Take 50 mg by mouth at bedtime.    [provider]  ondansetron (ZOFRAN) 4 MG tablet Take 4 mg by mouth daily as needed for nausea or vomiting.  09/27/20   [provider]  polyethylene glycol powder (GLYCOLAX/MIRALAX) 17 GM/SCOOP powder Take 8.5-17 g by mouth daily as needed for mild constipation (MIX AND DRINK).    [provider]  Saline (SIMPLY SALINE) 0.9 % AERS Place 1 spray into both nostrils as needed (for congestion).    [provider]     Physical Exam: Vitals:   01/25/23 1956 01/25/23 2045  BP:  (!) 145/93  Pulse:  72  Resp:  15  Temp:  98.9 F (37.2 C)  TempSrc:  Oral  SpO2:  98%  Weight: 47.6 kg   Height: 5\' 5"  (1.651 m)     Physical Exam   Labs on Admission: I have personally reviewed following labs and imaging studies  CBC: Recent Labs  Lab 01/25/23 2114  WBC 11.9*  NEUTROABS 9.5*  HGB 11.7*  HCT 35.5*  MCV 90.3  PLT 238   Basic Metabolic  Panel: Recent Labs  Lab 01/25/23 2114  NA 132*  K 4.1  CL 99  CO2 23  GLUCOSE 162*  BUN 17  CREATININE 1.16*  CALCIUM 9.2   GFR: Estimated Creatinine Clearance: 20.8 mL/min (A) (by C-G formula based on SCr of 1.16 mg/dL (H)). Liver Function Tests: No results for input(s): "AST", "ALT", "ALKPHOS", "BILITOT", "PROT", "ALBUMIN" in the last 168 hours. No results for input(s): "LIPASE", "AMYLASE" in the last 168 hours. No results for input(s): "AMMONIA" in the last 168 hours. Coagulation Profile: No results for input(s): "INR", "PROTIME" in the last 168 hours. Cardiac Enzymes: No results for input(s): "CKTOTAL", "CKMB", "CKMBINDEX", "TROPONINI", "TROPONINIHS" in the last 168 hours. BNP (last 3 results) No results for input(s): "BNP" in the last 8760 hours. HbA1C: No results for input(s): "HGBA1C" in the last 72 hours. CBG: No results for input(s): "GLUCAP" in the last 168 hours. Lipid Profile: No results for input(s): "CHOL", "HDL", "LDLCALC", "TRIG", "CHOLHDL", "LDLDIRECT" in the last 72 hours. Thyroid Function Tests: No results for input(s): "TSH", "T4TOTAL", "FREET4", "T3FREE", "THYROIDAB" in the last 72 hours. Anemia  Panel: No results for input(s): "VITAMINB12", "FOLATE", "FERRITIN", "TIBC", "IRON", "RETICCTPCT" in the last 72 hours. Urine analysis:    Component Value Date/Time   COLORURINE YELLOW 01/19/2022 2322   APPEARANCEUR CLEAR 01/19/2022 2322   LABSPEC 1.010 01/19/2022 2322   PHURINE 7.0 01/19/2022 2322   GLUCOSEU NEGATIVE 01/19/2022 2322   HGBUR NEGATIVE 01/19/2022 2322   BILIRUBINUR NEGATIVE 01/19/2022 2322   KETONESUR NEGATIVE 01/19/2022 2322   PROTEINUR NEGATIVE 01/19/2022 2322   NITRITE NEGATIVE 01/19/2022 2322   LEUKOCYTESUR NEGATIVE 01/19/2022 2322    Radiological Exams on Admission: I have personally reviewed images DG Femur Portable Min 2 Views Left  Result Date: 01/25/2023 CLINICAL DATA:  Fall, pain EXAM: LEFT FEMUR PORTABLE 2 VIEWS COMPARISON:  None Available. FINDINGS: AP view only demonstrates no acute bony abnormality. No visible fracture. No subluxation or dislocation. IMPRESSION: No acute bony abnormality. Electronically Signed   By: Charlett Nose M.D.   On: 01/25/2023 22:12   DG Femur Min 2 Views Right  Result Date: 01/25/2023 CLINICAL DATA:  Fall, right leg pain EXAM: RIGHT FEMUR 2 VIEWS COMPARISON:  Right hip series today FINDINGS: Right femoral neck fracture seen on right hip series. No additional femoral abnormality. IMPRESSION: Right femoral neck fracture arm hip series. No additional femoral abnormality. Electronically Signed   By: Charlett Nose M.D.   On: 01/25/2023 22:11   DG Pelvis Portable  Result Date: 01/25/2023 CLINICAL DATA:  Fall, right hip pain EXAM: PORTABLE PELVIS 1-2 VIEWS COMPARISON:  None available FINDINGS: There is a right femoral neck fracture with varus angulation. No subluxation or dislocation. Diffuse osteopenia. IMPRESSION: Right femoral neck fracture with varus angulation. Electronically Signed   By: Charlett Nose M.D.   On: 01/25/2023 22:10   DG Chest Portable 1 View  Result Date: 01/25/2023 CLINICAL DATA:  Fall EXAM: PORTABLE CHEST 1 VIEW  COMPARISON:  None available FINDINGS: Heart and mediastinal contours within normal limits. Aortic atherosclerosis. No confluent opacities or effusions. No acute bony abnormality. IMPRESSION: No acute cardiopulmonary disease Electronically Signed   By: Charlett Nose M.D.   On: 01/25/2023 22:10    EKG: My personal interpretation of EKG shows: ***    Assessment/Plan: Principal Problem:   Closed right hip fracture [facility]) Active Problems:   Fall at home, initial encounter   Essential hypertension   Hyperlipidemia   Chronic diastolic CHF (congestive heart failure) (HCC)  History of TIA (transient ischemic attack)   Dementia without behavioral disturbance (HCC)   History of CAD (coronary artery disease)   CKD (chronic kidney disease) stage 3, GFR 30-59 ml/min (HCC)    Assessment and Plan: Right-sided hip fracture   Mechanic fall at home  Essential hypertension  Hyperlipidemia  Chronic diastolic heart failure with preserved EF  History of IM - Hold Plavix for possible surgical intervention in the a.m.  History of CAD  CKD stage IIIb  Chronic rhinitis/allergy - Continue singular    DVT prophylaxis:  SCDs Code Status:  DNR/DNI(Do NOT Intubate) Diet:  Family Communication:  *** Family was present at bedside, at the time of interview.  Opportunity was given to ask question and all questions were answered satisfactorily.  Disposition Plan:  ***  Consults:  ***  Admission status:   Inpatient, Telemetry bed  Severity of Illness: The appropriate patient status for this patient is INPATIENT. Inpatient status is judged to be reasonable and necessary in order to provide the required intensity of service to ensure the patient's safety. The patient's presenting symptoms, physical exam findings, and initial radiographic and laboratory data in the context of their chronic comorbidities is felt to place them at high risk for further clinical deterioration. Furthermore, it is not  anticipated that the patient will be medically stable for discharge from the hospital within 2 midnights of admission.   * I certify that at the point of admission it is my clinical judgment that the patient will require inpatient hospital care spanning beyond 2 midnights from the point of admission due to high intensity of service, high risk for further deterioration and high frequency of surveillance required.Marland Kitchen    Tereasa Coop, MD [facility]  How to contact the [facility] Attending or Consulting provider 7A - 7P or covering provider during after hours 7P -7A, for this patient.  Check the care team in [facility] and look for a) attending/consulting [facility] provider listed and b) the [facility] team listed Log into [email or web address] and use [place]'s universal password to access. If you do not have the password, please contact the hospital operator. Locate the [facility] provider you are looking for under [facility] and page to a number that you can be directly reached. If you still have difficulty reaching the provider, please page the [facility] (Director on Call) for the Hospitalists listed on amion for assistance.  01/25/2023, 10:35 PM

## 2023-01-26 NOTE — Anesthesia Preprocedure Evaluation (Addendum)
Anesthesia Evaluation  Patient identified by MRN, date of birth, ID band Patient confused    Reviewed: Allergy & Precautions, NPO status , Patient's Chart, lab work & pertinent test results  Airway Mallampati: I  TM Distance: >3 FB Neck ROM: Full    Dental  (+) Edentulous Upper, Partial Lower   Pulmonary former smoker   Pulmonary exam normal        Cardiovascular hypertension, Pt. on medications + CAD and +CHF   Rhythm:Regular Rate:Normal  ECHO 2022:  1. Left ventricular ejection fraction, by estimation, is 65 to 70%. The  left ventricle has normal function. The left ventricle has no regional  wall motion abnormalities. There is mild concentric left ventricular  hypertrophy. Left ventricular diastolic  parameters are consistent with Grade I diastolic dysfunction (impaired  relaxation). Elevated left atrial pressure.   2. Right ventricular systolic function is normal. The right ventricular  size is normal. There is normal pulmonary artery systolic pressure.   3. Right atrial size was mildly dilated.   4. The mitral valve is normal in structure. No evidence of mitral valve  regurgitation. No evidence of mitral stenosis.   5. Tricuspid valve regurgitation is moderate.   6. The aortic valve is normal in structure. There is severe calcifcation  of the aortic valve. There is severe thickening of the aortic valve.  Aortic valve regurgitation is trivial. Mild to moderate aortic valve  sclerosis/calcification is present,  without any evidence of aortic stenosis.   7. The inferior vena cava is normal in size with greater than 50%  respiratory variability, suggesting right atrial pressure of 3 mmHg.   Conclusion(s)/Recommendation(s): No intracardiac source of embolism  detected on this transthoracic study. A transesophageal echocardiogram is  recommended to exclude cardiac source of embolism if clinically indicated.      Neuro/Psych  Headaches      Dementia TIA   GI/Hepatic Neg liver ROS,GERD  ,,  Endo/Other  diabetes    Renal/GU CRFRenal disease  negative genitourinary   Musculoskeletal  (+) Arthritis , Osteoarthritis,  Hip fracture    Abdominal Normal abdominal exam  (+)   Peds  Hematology  (+) Blood dyscrasia, anemia   Anesthesia Other Findings   Reproductive/Obstetrics                             Anesthesia Physical Anesthesia Plan  ASA: 3  Anesthesia Plan: General   Post-op Pain Management:    Induction: Intravenous  PONV Risk Score and Plan: 3 and Ondansetron, Dexamethasone and Treatment may vary due to age or medical condition  Airway Management Planned: Mask and Oral ETT  Additional Equipment: None  Intra-op Plan:   Post-operative Plan: Extubation in OR  Informed Consent: I have reviewed the patients History and Physical, chart, labs and discussed the procedure including the risks, benefits and alternatives for the proposed anesthesia with the patient or authorized representative who has indicated his/her understanding and acceptance.     Consent reviewed with POA  Plan Discussed with: CRNA  Anesthesia Plan Comments:        Anesthesia Quick Evaluation

## 2023-01-26 NOTE — Progress Notes (Signed)
OT Cancellation Note  Patient Details Name: Brenda Lynch MRN: [id number] DOB: 12/18/1923   Cancelled Treatment:    Reason Eval/Treat Not Completed: Patient not medically ready OT eval received, chart reviewed. Note full ortho consult to take place this morning. Will hold OT evaluation and await recommendations from ortho MD prior to initiating any mobility.   Barry Brunner, OT Acute Rehabilitation Services Office [phone]  [name] 01/26/2023, 9:15 AM

## 2023-01-26 NOTE — Consult Note (Signed)
Reason for Consult:Right hip fx Referring Physician: Mauro Kaufmann Time called: 0730 Time at bedside: 0905   Brenda Lynch is an 89 y.o. female.  HPI: [name] was at home and fell. She had immediate right hip pain and could not get up. She was brought to the ED where x-rays showed a right femoral neck fx and orthopedic surgery was consulted. She lives at home and ambulates with a RW.  Past Medical History:  Diagnosis Date   Allergic rhinitis    Chronic diastolic CHF (congestive heart failure) (HCC)    CKD (chronic kidney disease), stage II    Coronary artery disease    Diabetes mellitus    Diverticulosis    Drug-induced bradycardia 01/28/2016   Hypercholesterolemia    Hypertension    Hyponatremia    Meningioma (HCC)    Osteoarthritis    Sedentary lifestyle    Spinal stenosis    Vertigo, benign positional     Past Surgical History:  Procedure Laterality Date   BACK SURGERY     BREAST BIOPSY     SKIN FULL THICKNESS GRAFT Right 11/08/2017   Procedure: RIGHT SMALL FINGER ABLATION OF NAIL ANDSKIN GRAFT FULL THICKNESS;  Surgeon: Betha Loa, MD;  Location: [facility];  Service: Orthopedics;  Laterality: Right;   TONSILLECTOMY      Family History  Problem Relation Age of Onset   Heart disease Mother    Heart attack Mother    Prostate cancer Father    Lung cancer Brother    Pancreatic cancer Brother    Headache Neg Hx     Social History:  reports that she quit smoking about 44 years ago. Her smoking use included cigarettes. She has never used smokeless tobacco. She reports that she does not drink alcohol and does not use drugs.  Allergies:  Allergies  Allergen Reactions   Ciprofloxacin Nausea Only and Other (See Comments)    GI intolerance  Other reaction(s): GI intol   Doxycycline Hyclate Other (See Comments)    BS elev, Head pressure    Penicillins Hives and Other (See Comments)    Welts, also Has patient had a PCN reaction causing immediate rash,  facial/tongue/throat swelling, SOB or lightheadedness with hypotension: Yes Has patient had a PCN reaction causing severe rash involving mucus membranes or skin necrosis: No Has patient had a PCN reaction that required hospitalization No Has patient had a PCN reaction occurring within the last 10 years: No If all of the above answers are "NO", then may proceed with Cephalosporin use.   Penicillin G Hives and Other (See Comments)    Welts on the body, also     Medications: I have reviewed the patient's current medications.  Results for orders placed or performed during the hospital encounter of 01/25/23 (from the past 48 hour(s))  CBC with Differential     Status: Abnormal   Collection Time: 01/25/23  9:14 PM  Result Value Ref Range   WBC 11.9 (H) 4.0 - 10.5 K/uL   RBC 3.93 3.87 - 5.11 MIL/uL   Hemoglobin 11.7 (L) 12.0 - 15.0 g/dL   HCT 45.4 (L) 09.8 - 11.9 %   MCV 90.3 80.0 - 100.0 fL   MCH 29.8 26.0 - 34.0 pg   MCHC 33.0 30.0 - 36.0 g/dL   RDW 14.7 (H) 82.9 - 56.2 %   Platelets 238 150 - 400 K/uL   nRBC 0.0 0.0 - 0.2 %   Neutrophils Relative % 81 %  Neutro Abs 9.5 (H) 1.7 - 7.7 K/uL   Lymphocytes Relative 14 %   Lymphs Abs 1.7 0.7 - 4.0 K/uL   Monocytes Relative 5 %   Monocytes Absolute 0.6 0.1 - 1.0 K/uL   Eosinophils Relative 0 %   Eosinophils Absolute 0.0 0.0 - 0.5 K/uL   Basophils Relative 0 %   Basophils Absolute 0.0 0.0 - 0.1 K/uL   Immature Granulocytes 0 %   Abs Immature Granulocytes 0.05 0.00 - 0.07 K/uL    Comment: Performed at [facility], [address], [place], [place] [address]  Basic metabolic panel     Status: Abnormal   Collection Time: 01/25/23  9:14 PM  Result Value Ref Range   Sodium 132 (L) 135 - 145 mmol/L   Potassium 4.1 3.5 - 5.1 mmol/L   Chloride 99 98 - 111 mmol/L   CO2 23 22 - 32 mmol/L   Glucose, Bld 162 (H) 70 - 99 mg/dL    Comment: Glucose reference range applies only to samples taken after fasting for at least 8 hours.   BUN 17  8 - 23 mg/dL   Creatinine, Ser 8.41 (H) 0.44 - 1.00 mg/dL   Calcium 9.2 8.9 - 32.4 mg/dL   GFR, Estimated 43 (L) >60 mL/min    Comment: (NOTE) Calculated using the CKD-EPI Creatinine Equation (2021)    Anion gap 10 5 - 15    Comment: Performed at [facility], [address] [place], [place] [address]  CBC     Status: Abnormal   Collection Time: 01/26/23  6:17 AM  Result Value Ref Range   WBC 10.3 4.0 - 10.5 K/uL   RBC 3.89 3.87 - 5.11 MIL/uL   Hemoglobin 12.1 12.0 - 15.0 g/dL   HCT 72.5 (L) 36.6 - 44.0 %   MCV 87.9 80.0 - 100.0 fL   MCH 31.1 26.0 - 34.0 pg   MCHC 35.4 30.0 - 36.0 g/dL   RDW 34.7 (H) 42.5 - 95.6 %   Platelets 248 150 - 400 K/uL   nRBC 0.0 0.0 - 0.2 %    Comment: Performed at [facility], [address] [place], [place] [address]  Comprehensive metabolic panel     Status: Abnormal   Collection Time: 01/26/23  6:17 AM  Result Value Ref Range   Sodium 134 (L) 135 - 145 mmol/L   Potassium 4.4 3.5 - 5.1 mmol/L   Chloride 100 98 - 111 mmol/L   CO2 21 (L) 22 - 32 mmol/L   Glucose, Bld 144 (H) 70 - 99 mg/dL    Comment: Glucose reference range applies only to samples taken after fasting for at least 8 hours.   BUN 15 8 - 23 mg/dL   Creatinine, Ser 4.33 (H) 0.44 - 1.00 mg/dL   Calcium 8.7 (L) 8.9 - 10.3 mg/dL   Total Protein 6.7 6.5 - 8.1 g/dL   Albumin 3.2 (L) 3.5 - 5.0 g/dL   AST 21 15 - 41 U/L   ALT 20 0 - 44 U/L   Alkaline Phosphatase 86 38 - 126 U/L   Total Bilirubin 0.8 <1.2 mg/dL   GFR, Estimated 47 (L) >60 mL/min    Comment: (NOTE) Calculated using the CKD-EPI Creatinine Equation (2021)    Anion gap 13 5 - 15    Comment: Performed at [facility], [address], [place], [place] [address]    DG Femur Portable Min 2 Views Left  Result  Date: 01/25/2023 CLINICAL DATA:  Fall, pain EXAM: LEFT FEMUR PORTABLE 2 VIEWS COMPARISON:  None Available. FINDINGS: AP view only demonstrates no acute bony abnormality. No visible fracture. No  subluxation or dislocation. IMPRESSION: No acute bony abnormality. Electronically Signed   By: Charlett Nose M.D.   On: 01/25/2023 22:12   DG Femur Min 2 Views Right  Result Date: 01/25/2023 CLINICAL DATA:  Fall, right leg pain EXAM: RIGHT FEMUR 2 VIEWS COMPARISON:  Right hip series today FINDINGS: Right femoral neck fracture seen on right hip series. No additional femoral abnormality. IMPRESSION: Right femoral neck fracture arm hip series. No additional femoral abnormality. Electronically Signed   By: Charlett Nose M.D.   On: 01/25/2023 22:11   DG Pelvis Portable  Result Date: 01/25/2023 CLINICAL DATA:  Fall, right hip pain EXAM: PORTABLE PELVIS 1-2 VIEWS COMPARISON:  None available FINDINGS: There is a right femoral neck fracture with varus angulation. No subluxation or dislocation. Diffuse osteopenia. IMPRESSION: Right femoral neck fracture with varus angulation. Electronically Signed   By: Charlett Nose M.D.   On: 01/25/2023 22:10   DG Chest Portable 1 View  Result Date: 01/25/2023 CLINICAL DATA:  Fall EXAM: PORTABLE CHEST 1 VIEW COMPARISON:  None available FINDINGS: Heart and mediastinal contours within normal limits. Aortic atherosclerosis. No confluent opacities or effusions. No acute bony abnormality. IMPRESSION: No acute cardiopulmonary disease Electronically Signed   By: Charlett Nose M.D.   On: 01/25/2023 22:10    Review of Systems  Unable to perform ROS: Other   Blood pressure 129/70, pulse 81, temperature 97.7 F (36.5 C), temperature source Axillary, resp. rate 16, height 5\' 5"  (1.651 m), weight 47.6 kg, SpO2 97%. Physical Exam Constitutional:      General: She is not in acute distress.    Appearance: She is well-developed. She is not diaphoretic.  HENT:     Head: Normocephalic and atraumatic.  Eyes:     General:        Right eye: No discharge.        Left eye: No discharge.  Cardiovascular:     Rate and Rhythm: Normal rate and regular rhythm.  Pulmonary:     Effort:  Pulmonary effort is normal. No respiratory distress.  Musculoskeletal:     Cervical back: Normal range of motion.     Comments: RLE No traumatic wounds, ecchymosis, or rash  No knee or ankle effusion  Knee stable to varus/ valgus and anterior/posterior stress  Sens DPN, SPN, TN could not assess  Motor EHL, ext, flex, evers could not assess  DP 0, PT 0, No significant edema  Skin:    General: Skin is warm and dry.     Assessment/Plan: Right hip fx -- Plan hip hemi tomorrow with Dr. Linna Caprice. Please keep NPO after MN. Multiple medical problems including diastolic heart failure preserved EF 65 to 70%, essential hypertension, transient ischemic attack, DM type II, hyperlipidemia, dementia, CAD, and CKD stage IIIb -- per primary service    Freeman Caldron, PA-C Orthopedic Surgery [phone] 01/26/2023, 9:37 AM

## 2023-01-26 NOTE — Progress Notes (Addendum)
PT Cancellation Note  Patient Details Name: Brenda Lynch MRN: [id number] DOB: [birth year]   Cancelled Treatment:    Reason Eval/Treat Not Completed: Patient not medically ready. PT eval received, chart reviewed. Note full ortho consult to take place this morning. Will hold PT evaluation and await recommendations from ortho MD prior to initiating any mobility.   Addendum 10:24 AM: Per ortho, pt to have surgery tomorrow morning. Will hold PT evaluation and assess post-op as able.     Marylynn Pearson 01/26/2023, 8:25 AM  Conni Slipper, PT, DPT Acute Rehabilitation Services Secure Chat Preferred Office: [phone]

## 2023-01-26 NOTE — Progress Notes (Signed)
Pt's son refused meds and assessment at this time, pt is sleeping and he does not want her disturbed as she will become agitated and fight.

## 2023-01-26 NOTE — Progress Notes (Addendum)
[facility]  PROGRESS NOTE  Brenda Lynch WUJ:[id number] DOB: 1923-07-23 DOA: 01/25/2023 PCP: Georgann Housekeeper, MD   Brief HPI:    89 y.o. female with medical history significant of diastolic heart failure preserved EF 65 to 70%, essential hypertension, transient ischemic attack, DM type II, hyperlipidemia, dementia, CAD, and CKD stage IIIb presented to emergency department for evaluation for a mechanical fall.  Patient reported she had a fall while ambulating to her bathroom landed on her right-sided hip.  Patient denies any head injury and loss of consciousness. Due to dementia at the baseline unable to relay any history with the patient.  Patient's son helped with the history.  Reported patient had a fall as mentioned above.  Since the fall patient is complaining about right-sided upper leg pain and hip joint pain. ED physician consulted and spoke with orthopedic surgeon Dr. Duwayne Heck recommended keep patient n.p.o. and will evaluate patient soon.     Assessment/Plan:   Right femoral neck fracture -Orthopedics consulted, plan for Hemi arthroplasty per orthopedics -Will keep her n.p.o. after midnight -DVT prophylaxis per orthopedics  Mechanical fall at home -Status post mechanical fall at home -No loss of consciousness  Hypertension/chronic diastolic heart failure -EF 65 to 70% -Currently well compensated -Blood pressure well-controlled -Continue ramipril, hydrochlorothiazide -Continue as needed hydralazine  History of CAD -Plavix on hold for possible surgical intervention -Continue Lipitor 20 mg daily  Hyperlipidemia -Continue Lipitor  CKD stage IIIb -Creatinine stable at 1.16 -At baseline  Chronic rhinitis -Continue Singulair    Medications     [START ON 01/27/2023] atorvastatin  20 mg Oral Daily   busPIRone  10 mg Oral QHS   busPIRone  5 mg Oral Daily   hydrochlorothiazide  12.5 mg Oral Daily   montelukast  10 mg Oral QHS   pantoprazole  40 mg Oral QAC  breakfast   ramipril  10 mg Oral Daily     Data Reviewed:   CBG:  No results for input(s): "GLUCAP" in the last 168 hours.  SpO2: 97 %    Vitals:   01/25/23 2045 01/25/23 2325 01/26/23 0505 01/26/23 0740  BP: (!) 145/93 (!) 168/81 134/78 129/70  Pulse: 72 79 89 81  Resp: 15 19 16    Temp: 98.9 F (37.2 C) 98.5 F (36.9 C) 98.7 F (37.1 C)   TempSrc: Oral Oral Oral   SpO2: 98% 95% 100% 97%  Weight:      Height:          Data Reviewed:  Basic Metabolic Panel: Recent Labs  Lab 01/25/23 2114 01/26/23 0617  NA 132* 134*  K 4.1 4.4  CL 99 100  CO2 23 21*  GLUCOSE 162* 144*  BUN 17 15  CREATININE 1.16* 1.08*  CALCIUM 9.2 8.7*    CBC: Recent Labs  Lab 01/25/23 2114 01/26/23 0617  WBC 11.9* 10.3  NEUTROABS 9.5*  --   HGB 11.7* 12.1  HCT 35.5* 34.2*  MCV 90.3 87.9  PLT 238 248    LFT Recent Labs  Lab 01/26/23 0617  AST 21  ALT 20  ALKPHOS 86  BILITOT 0.8  PROT 6.7  ALBUMIN 3.2*     Antibiotics: Anti-infectives (From admission, onward)    None        DVT prophylaxis: SCDs  Code Status: DNR  Family Communication: Discussed with patient's son at bedside   CONSULTS orthopedics   Subjective   Patient sleeping soundly after she received fentanyl last night.   Objective  Physical Examination:   General: Somnolent Cardiovascular: S1-S2, regular Respiratory: Lungs clear to auscultation bilaterally Abdomen: Soft, nontender, no organomegaly Extremities: No edema in the lower extremities Neurologic: Somnolent, not arousable to verbal stimuli   Status is: Inpatient:          Meredeth Ide   [facility] If 7PM-7AM, please contact night-coverage at [email or web address], Office  [phone]   01/26/2023, 8:07 AM  LOS: 1 day

## 2023-01-27 ENCOUNTER — Encounter (HOSPITAL_COMMUNITY): Payer: Self-pay | Admitting: Internal Medicine

## 2023-01-27 ENCOUNTER — Inpatient Hospital Stay (HOSPITAL_COMMUNITY): Payer: Self-pay | Admitting: Anesthesiology

## 2023-01-27 ENCOUNTER — Other Ambulatory Visit: Payer: Self-pay

## 2023-01-27 ENCOUNTER — Inpatient Hospital Stay (HOSPITAL_COMMUNITY): Payer: Medicare PPO | Admitting: Anesthesiology

## 2023-01-27 ENCOUNTER — Encounter (HOSPITAL_COMMUNITY)
Admission: EM | Disposition: A | Discharge status: Home Health | Payer: Self-pay | Source: Home / Self Care | Attending: Family Medicine

## 2023-01-27 ENCOUNTER — Inpatient Hospital Stay (HOSPITAL_COMMUNITY): Payer: Medicare PPO

## 2023-01-27 DIAGNOSIS — F0390 Unspecified dementia without behavioral disturbance: Secondary | ICD-10-CM | POA: Diagnosis not present

## 2023-01-27 DIAGNOSIS — I2510 Atherosclerotic heart disease of native coronary artery without angina pectoris: Secondary | ICD-10-CM

## 2023-01-27 DIAGNOSIS — S72001A Fracture of unspecified part of neck of right femur, initial encounter for closed fracture: Secondary | ICD-10-CM

## 2023-01-27 DIAGNOSIS — N1832 Chronic kidney disease, stage 3b: Secondary | ICD-10-CM | POA: Diagnosis not present

## 2023-01-27 DIAGNOSIS — W19XXXA Unspecified fall, initial encounter: Secondary | ICD-10-CM | POA: Diagnosis not present

## 2023-01-27 HISTORY — PX: ANTERIOR APPROACH HEMI HIP ARTHROPLASTY: SHX6690

## 2023-01-27 LAB — GLUCOSE, CAPILLARY
Glucose-Capillary: 169 mg/dL — ABNORMAL HIGH (ref 70–99)
Glucose-Capillary: 136 mg/dL — ABNORMAL HIGH (ref 70–99)

## 2023-01-27 SURGERY — HEMIARTHROPLASTY, HIP, DIRECT ANTERIOR APPROACH, FOR FRACTURE
Anesthesia: General | Laterality: Right

## 2023-01-27 MED ORDER — DOCUSATE SODIUM 100 MG PO CAPS
100.0000 mg | ORAL_CAPSULE | Freq: Two times a day (BID) | ORAL | Status: DC
  Administered 2023-01-28 (×2): 100 mg via ORAL
  Filled 2023-01-27 (×4): qty 1

## 2023-01-27 MED ORDER — HYDRALAZINE HCL 20 MG/ML IJ SOLN
5.0000 mg | Freq: Once | INTRAMUSCULAR | Status: AC
  Administered 2023-01-27: 5 mg via INTRAVENOUS

## 2023-01-27 MED ORDER — CHLORHEXIDINE GLUCONATE 4 % EX SOLN: 60.0000 mL | Freq: Once | CUTANEOUS | Status: DC

## 2023-01-27 MED ORDER — DEXAMETHASONE SODIUM PHOSPHATE 10 MG/ML IJ SOLN
INTRAMUSCULAR | Status: AC
  Filled 2023-01-27: qty 1

## 2023-01-27 MED ORDER — METOCLOPRAMIDE HCL 5 MG PO TABS: 5.0000 mg | ORAL_TABLET | Freq: Three times a day (TID) | ORAL | Status: DC | PRN

## 2023-01-27 MED ORDER — SUGAMMADEX SODIUM 200 MG/2ML IV SOLN
INTRAVENOUS | Status: DC | PRN
  Administered 2023-01-27: 100 mg via INTRAVENOUS

## 2023-01-27 MED ORDER — SORBITOL 70 % SOLN: 30.0000 mL | Freq: Every day | Status: DC | PRN

## 2023-01-27 MED ORDER — FENTANYL CITRATE (PF) 100 MCG/2ML IJ SOLN
INTRAMUSCULAR | Status: AC
  Filled 2023-01-27: qty 2

## 2023-01-27 MED ORDER — PRONTOSAN WOUND IRRIGATION OPTIME
TOPICAL | Status: DC | PRN
  Administered 2023-01-27: 450 mL via TOPICAL

## 2023-01-27 MED ORDER — SODIUM CHLORIDE 0.9% FLUSH: 10.0000 mL | Freq: Once | INTRAVENOUS | Status: DC

## 2023-01-27 MED ORDER — EPHEDRINE 5 MG/ML INJ
INTRAVENOUS | Status: AC
Start: 2023-01-27 — End: ?
  Filled 2023-01-27: qty 5

## 2023-01-27 MED ORDER — HYDROCODONE-ACETAMINOPHEN 7.5-325 MG PO TABS
1.0000 | ORAL_TABLET | ORAL | Status: DC | PRN
Start: 2023-01-27 — End: 2023-01-30

## 2023-01-27 MED ORDER — LIDOCAINE 2% (20 MG/ML) 5 ML SYRINGE
INTRAMUSCULAR | Status: DC | PRN
  Administered 2023-01-27: 60 mg via INTRAVENOUS

## 2023-01-27 MED ORDER — TRANEXAMIC ACID-NACL 1000-0.7 MG/100ML-% IV SOLN
1000.0000 mg | Freq: Once | INTRAVENOUS | Status: AC
  Administered 2023-01-27: 1000 mg via INTRAVENOUS
  Filled 2023-01-27: qty 100

## 2023-01-27 MED ORDER — ONDANSETRON HCL 4 MG PO TABS: 4.0000 mg | ORAL_TABLET | Freq: Four times a day (QID) | ORAL | Status: DC | PRN

## 2023-01-27 MED ORDER — SODIUM CHLORIDE 0.9 % IV SOLN: INTRAVENOUS | Status: AC

## 2023-01-27 MED ORDER — PROPOFOL 10 MG/ML IV BOLUS
INTRAVENOUS | Status: DC | PRN
  Administered 2023-01-27: 60 mg via INTRAVENOUS

## 2023-01-27 MED ORDER — PHENYLEPHRINE HCL-NACL 20-0.9 MG/250ML-% IV SOLN
INTRAVENOUS | Status: DC | PRN
  Administered 2023-01-27: 50 ug/min via INTRAVENOUS

## 2023-01-27 MED ORDER — ACETAMINOPHEN 500 MG PO TABS
500.0000 mg | ORAL_TABLET | Freq: Four times a day (QID) | ORAL | Status: AC
  Administered 2023-01-27: 500 mg via ORAL
  Filled 2023-01-27 (×3): qty 1

## 2023-01-27 MED ORDER — ONDANSETRON HCL 4 MG/2ML IJ SOLN
INTRAMUSCULAR | Status: DC | PRN
  Administered 2023-01-27: 4 mg via INTRAVENOUS

## 2023-01-27 MED ORDER — CEFAZOLIN SODIUM-DEXTROSE 2-4 GM/100ML-% IV SOLN
2.0000 g | Freq: Two times a day (BID) | INTRAVENOUS | Status: AC
Start: 2023-01-27 — End: 2023-01-28
  Administered 2023-01-27 – 2023-01-28 (×2): 2 g via INTRAVENOUS
  Filled 2023-01-27 (×2): qty 100

## 2023-01-27 MED ORDER — ROCURONIUM BROMIDE 10 MG/ML (PF) SYRINGE
PREFILLED_SYRINGE | INTRAVENOUS | Status: DC | PRN
  Administered 2023-01-27: 40 mg via INTRAVENOUS

## 2023-01-27 MED ORDER — POLYETHYLENE GLYCOL 3350 17 G PO PACK: 17.0000 g | PACK | Freq: Every day | ORAL | Status: DC | PRN

## 2023-01-27 MED ORDER — CEFAZOLIN SODIUM-DEXTROSE 2-4 GM/100ML-% IV SOLN
INTRAVENOUS | Status: AC
  Filled 2023-01-27: qty 100

## 2023-01-27 MED ORDER — ORAL CARE MOUTH RINSE: 15.0000 mL | Freq: Once | OROMUCOSAL | Status: DC

## 2023-01-27 MED ORDER — PROPOFOL 500 MG/50ML IV EMUL
INTRAVENOUS | Status: DC | PRN
  Administered 2023-01-27: 100 ug/kg/min via INTRAVENOUS

## 2023-01-27 MED ORDER — HYDRALAZINE HCL 20 MG/ML IJ SOLN
INTRAMUSCULAR | Status: AC
  Filled 2023-01-27: qty 1

## 2023-01-27 MED ORDER — 0.9 % SODIUM CHLORIDE (POUR BTL) OPTIME
TOPICAL | Status: DC | PRN
  Administered 2023-01-27: 1000 mL

## 2023-01-27 MED ORDER — CHLORHEXIDINE GLUCONATE 0.12 % MT SOLN: 15.0000 mL | Freq: Once | OROMUCOSAL | Status: DC

## 2023-01-27 MED ORDER — LACTATED RINGERS IV SOLN: INTRAVENOUS | Status: DC | PRN

## 2023-01-27 MED ORDER — BUPIVACAINE-EPINEPHRINE (PF) 0.5% -1:200000 IJ SOLN
INTRAMUSCULAR | Status: AC
  Filled 2023-01-27: qty 30

## 2023-01-27 MED ORDER — METHOCARBAMOL 1000 MG/10ML IJ SOLN: 500.0000 mg | Freq: Four times a day (QID) | INTRAMUSCULAR | Status: DC | PRN

## 2023-01-27 MED ORDER — POVIDONE-IODINE 10 % EX SWAB: 2.0000 | Freq: Once | CUTANEOUS | Status: DC

## 2023-01-27 MED ORDER — PHENOL 1.4 % MT LIQD: 1.0000 | OROMUCOSAL | Status: DC | PRN

## 2023-01-27 MED ORDER — CEFAZOLIN SODIUM-DEXTROSE 2-4 GM/100ML-% IV SOLN
2.0000 g | INTRAVENOUS | Status: AC
  Administered 2023-01-27: 2 g via INTRAVENOUS

## 2023-01-27 MED ORDER — ACETAMINOPHEN 325 MG PO TABS
325.0000 mg | ORAL_TABLET | Freq: Four times a day (QID) | ORAL | Status: DC | PRN
  Administered 2023-01-28 – 2023-01-29 (×2): 650 mg via ORAL
  Filled 2023-01-27 (×2): qty 2

## 2023-01-27 MED ORDER — PROPOFOL 10 MG/ML IV BOLUS
INTRAVENOUS | Status: AC
  Filled 2023-01-27: qty 20

## 2023-01-27 MED ORDER — MORPHINE SULFATE (PF) 2 MG/ML IV SOLN
0.5000 mg | INTRAVENOUS | Status: DC | PRN
Start: 2023-01-27 — End: 2023-01-30
  Administered 2023-01-28 (×2): 0.5 mg via INTRAVENOUS
  Filled 2023-01-27 (×2): qty 1

## 2023-01-27 MED ORDER — ALBUMIN HUMAN 5 % IV SOLN: INTRAVENOUS | Status: DC | PRN

## 2023-01-27 MED ORDER — TRANEXAMIC ACID-NACL 1000-0.7 MG/100ML-% IV SOLN
1000.0000 mg | INTRAVENOUS | Status: AC
  Administered 2023-01-27: 1000 mg via INTRAVENOUS

## 2023-01-27 MED ORDER — SODIUM CHLORIDE (PF) 0.9 % IJ SOLN
INTRAMUSCULAR | Status: DC | PRN
  Administered 2023-01-27: 60 mL

## 2023-01-27 MED ORDER — FENTANYL CITRATE (PF) 100 MCG/2ML IJ SOLN
25.0000 ug | INTRAMUSCULAR | Status: DC | PRN
  Administered 2023-01-27: 50 ug via INTRAVENOUS
  Administered 2023-01-27 (×2): 25 ug via INTRAVENOUS

## 2023-01-27 MED ORDER — ACETAMINOPHEN 10 MG/ML IV SOLN
INTRAVENOUS | Status: DC | PRN
  Administered 2023-01-27: 720 mg via INTRAVENOUS

## 2023-01-27 MED ORDER — KETOROLAC TROMETHAMINE 30 MG/ML IJ SOLN
INTRAMUSCULAR | Status: AC
  Filled 2023-01-27: qty 1

## 2023-01-27 MED ORDER — MENTHOL 3 MG MT LOZG
1.0000 | LOZENGE | OROMUCOSAL | Status: DC | PRN
Start: 2023-01-27 — End: 2023-01-30

## 2023-01-27 MED ORDER — DEXAMETHASONE SODIUM PHOSPHATE 10 MG/ML IJ SOLN
INTRAMUSCULAR | Status: DC | PRN
  Administered 2023-01-27: 5 mg via INTRAVENOUS

## 2023-01-27 MED ORDER — KETOROLAC TROMETHAMINE 30 MG/ML IJ SOLN
INTRAMUSCULAR | Status: DC | PRN
  Administered 2023-01-27: 30 mg via INTRAMUSCULAR

## 2023-01-27 MED ORDER — PHENYLEPHRINE 80 MCG/ML (10ML) SYRINGE FOR IV PUSH (FOR BLOOD PRESSURE SUPPORT)
PREFILLED_SYRINGE | INTRAVENOUS | Status: DC | PRN
  Administered 2023-01-27 (×2): 160 ug via INTRAVENOUS

## 2023-01-27 MED ORDER — SODIUM CHLORIDE 0.9 % IV SOLN
INTRAVENOUS | Status: AC | PRN
  Administered 2023-01-27: 500 mL

## 2023-01-27 MED ORDER — HYDROCODONE-ACETAMINOPHEN 5-325 MG PO TABS
1.0000 | ORAL_TABLET | ORAL | Status: DC | PRN
  Administered 2023-01-28: 1 via ORAL
  Filled 2023-01-27: qty 2
  Filled 2023-01-27: qty 1

## 2023-01-27 MED ORDER — TRANEXAMIC ACID-NACL 1000-0.7 MG/100ML-% IV SOLN
INTRAVENOUS | Status: AC
  Filled 2023-01-27: qty 100

## 2023-01-27 MED ORDER — ROCURONIUM BROMIDE 10 MG/ML (PF) SYRINGE
PREFILLED_SYRINGE | INTRAVENOUS | Status: AC
  Filled 2023-01-27: qty 10

## 2023-01-27 MED ORDER — KETAMINE HCL 50 MG/5ML IJ SOSY
PREFILLED_SYRINGE | INTRAMUSCULAR | Status: AC
  Filled 2023-01-27: qty 5

## 2023-01-27 MED ORDER — SODIUM CHLORIDE 0.9 % IR SOLN
Status: DC | PRN
  Administered 2023-01-27: 1000 mL

## 2023-01-27 MED ORDER — ONDANSETRON HCL 4 MG/2ML IJ SOLN: 4.0000 mg | Freq: Four times a day (QID) | INTRAMUSCULAR | Status: DC | PRN

## 2023-01-27 MED ORDER — METOCLOPRAMIDE HCL 5 MG/ML IJ SOLN: 5.0000 mg | Freq: Three times a day (TID) | INTRAMUSCULAR | Status: DC | PRN

## 2023-01-27 MED ORDER — MAGNESIUM CITRATE PO SOLN: 1.0000 | Freq: Once | ORAL | Status: DC | PRN

## 2023-01-27 MED ORDER — SENNA 8.6 MG PO TABS
1.0000 | ORAL_TABLET | Freq: Two times a day (BID) | ORAL | Status: DC
  Administered 2023-01-27 – 2023-01-29 (×4): 8.6 mg via ORAL
  Filled 2023-01-27 (×5): qty 1

## 2023-01-27 MED ORDER — LIDOCAINE 2% (20 MG/ML) 5 ML SYRINGE
INTRAMUSCULAR | Status: AC
  Filled 2023-01-27: qty 5

## 2023-01-27 MED ORDER — METHOCARBAMOL 500 MG PO TABS: 500.0000 mg | ORAL_TABLET | Freq: Four times a day (QID) | ORAL | Status: DC | PRN

## 2023-01-27 MED ORDER — ONDANSETRON HCL 4 MG/2ML IJ SOLN
INTRAMUSCULAR | Status: AC
Start: 2023-01-27 — End: ?
  Filled 2023-01-27: qty 2

## 2023-01-27 MED ORDER — CLOPIDOGREL BISULFATE 75 MG PO TABS
75.0000 mg | ORAL_TABLET | Freq: Every day | ORAL | Status: DC
  Administered 2023-01-28 – 2023-01-29 (×2): 75 mg via ORAL
  Filled 2023-01-27 (×2): qty 1

## 2023-01-27 SURGICAL SUPPLY — 60 items
ADH SKN CLS APL DERMABOND .7 (GAUZE/BANDAGES/DRESSINGS) ×1
ALCOHOL 70% 16 OZ (MISCELLANEOUS) ×2 IMPLANT
APL PRP STRL LF DISP 70% ISPRP (MISCELLANEOUS) ×1
BAG COUNTER SPONGE SURGICOUNT (BAG) ×2 IMPLANT
BAG SPNG CNTER NS LX DISP (BAG) ×1
BLADE CLIPPER SURG (BLADE) IMPLANT
CHLORAPREP W/TINT 26 (MISCELLANEOUS) ×2 IMPLANT
COVER SURGICAL LIGHT HANDLE (MISCELLANEOUS) ×2 IMPLANT
DERMABOND ADVANCED .7 DNX12 (GAUZE/BANDAGES/DRESSINGS) ×4 IMPLANT
DRAPE 3/4 80X56 (DRAPES) ×6 IMPLANT
DRAPE C-ARM 42X72 X-RAY (DRAPES) ×2 IMPLANT
DRAPE STERI IOBAN 125X83 (DRAPES) ×2 IMPLANT
DRAPE U-SHAPE 47X51 STRL (DRAPES) ×6 IMPLANT
DRSG AQUACEL AG ADV 3.5X10 (GAUZE/BANDAGES/DRESSINGS) ×2 IMPLANT
ELECT BLADE 4.0 EZ CLEAN MEGAD (MISCELLANEOUS) ×1
ELECT PENCIL ROCKER SW 15FT (MISCELLANEOUS) ×2 IMPLANT
ELECT REM PT RETURN 9FT ADLT (ELECTROSURGICAL) ×1
ELECTRODE BLDE 4.0 EZ CLN MEGD (MISCELLANEOUS) ×2 IMPLANT
ELECTRODE REM PT RTRN 9FT ADLT (ELECTROSURGICAL) ×2 IMPLANT
EVACUATOR 1/8 PVC DRAIN (DRAIN) IMPLANT
GLOVE BIO SURGEON STRL SZ7.5 (GLOVE) ×2 IMPLANT
GLOVE BIO SURGEON STRL SZ8.5 (GLOVE) ×4 IMPLANT
GLOVE BIOGEL PI IND STRL 7.5 (GLOVE) ×2 IMPLANT
GLOVE BIOGEL PI IND STRL 8.5 (GLOVE) ×2 IMPLANT
GOWN STRL REUS W/ TWL XL LVL3 (GOWN DISPOSABLE) ×2 IMPLANT
GOWN STRL REUS W/TWL 2XL LVL3 (GOWN DISPOSABLE) ×2 IMPLANT
GOWN STRL REUS W/TWL XL LVL3 (GOWN DISPOSABLE) ×1
HANDPIECE INTERPULSE COAX TIP (DISPOSABLE) ×1
HEAD MOD COCR 28MM HD -3MM NK (Orthopedic Implant) IMPLANT
HOOD PEEL AWAY T7 (MISCELLANEOUS) ×4 IMPLANT
KIT BASIN OR (CUSTOM PROCEDURE TRAY) ×2 IMPLANT
KIT TURNOVER KIT B (KITS) ×2 IMPLANT
MANIFOLD NEPTUNE II (INSTRUMENTS) ×2 IMPLANT
MARKER SKIN DUAL TIP RULER LAB (MISCELLANEOUS) ×4 IMPLANT
NDL SPNL 18GX3.5 QUINCKE PK (NEEDLE) ×2 IMPLANT
NEEDLE SPNL 18GX3.5 QUINCKE PK (NEEDLE) ×1 IMPLANT
NS IRRIG 1000ML POUR BTL (IV SOLUTION) ×2 IMPLANT
PACK ANTERIOR HIP CUSTOM (KITS) ×2 IMPLANT
PAD ARMBOARD 7.5X6 YLW CONV (MISCELLANEOUS) ×4 IMPLANT
RINGBLOC BI POLAR 28X48MM (Orthopedic Implant) ×1 IMPLANT
SAW OSC TIP CART 19.5X105X1.3 (SAW) ×2 IMPLANT
SEALER BIPOLAR AQUA 6.0 (INSTRUMENTS) IMPLANT
SET HNDPC FAN SPRY TIP SCT (DISPOSABLE) ×2 IMPLANT
SHELL RINGBLOC BI POLR 28X48MM (Orthopedic Implant) IMPLANT
SOLUTION PRONTOSAN WOUND 350ML (IRRIGATION / IRRIGATOR) ×2 IMPLANT
STEM FEM CMTLS HO 9X137 123D (Stem) IMPLANT
SUT MNCRL AB 3-0 PS2 18 (SUTURE) IMPLANT
SUT MON AB 2-0 CT1 36 (SUTURE) ×2 IMPLANT
SUT STRATAFIX 1PDS 45CM VIOLET (SUTURE) IMPLANT
SUT VIC AB 2-0 CT1 27 (SUTURE) ×1
SUT VIC AB 2-0 CT1 TAPERPNT 27 (SUTURE) ×2 IMPLANT
SUT VLOC 180 0 24IN GS25 (SUTURE) ×2 IMPLANT
SYR 50ML LL SCALE MARK (SYRINGE) ×2 IMPLANT
TOWEL GREEN STERILE (TOWEL DISPOSABLE) ×2 IMPLANT
TOWEL GREEN STERILE FF (TOWEL DISPOSABLE) ×2 IMPLANT
TRAY CATH INTERMITTENT SS 16FR (CATHETERS) IMPLANT
TRAY FOLEY W/BAG SLVR 16FR (SET/KITS/TRAYS/PACK)
TRAY FOLEY W/BAG SLVR 16FR ST (SET/KITS/TRAYS/PACK) IMPLANT
TUBE SUCTION HIGH CAP CLEAR NV (SUCTIONS) ×2 IMPLANT
WATER STERILE IRR 1000ML POUR (IV SOLUTION) ×6 IMPLANT

## 2023-01-27 NOTE — Anesthesia Postprocedure Evaluation (Signed)
Anesthesia Post Note  Patient: Brenda Lynch  Procedure(s) Performed: ANTERIOR APPROACH HEMI HIP ARTHROPLASTY (Right)     Patient location during evaluation: PACU Anesthesia Type: General Level of consciousness: awake and alert Pain management: pain level controlled Vital Signs Assessment: post-procedure vital signs reviewed and stable Respiratory status: spontaneous breathing, nonlabored ventilation, respiratory function stable and patient connected to nasal cannula oxygen Cardiovascular status: blood pressure returned to baseline and stable Postop Assessment: no apparent nausea or vomiting Anesthetic complications: no   No notable events documented.  Last Vitals:  Vitals:   01/27/23 1128 01/27/23 1200  BP:  128/66  Pulse:  97  Resp:  16  Temp: 36.8 C 36.7 C  SpO2:      Last Pain:  Vitals:   01/27/23 1200  TempSrc: Oral  PainSc:                  Nelle Don Kristl Morioka

## 2023-01-27 NOTE — TOC CAGE-AID Note (Signed)
Transition of Care [facility]) - CAGE-AID Screening   Patient Details  Name: Brenda Lynch MRN: [id number] Date of Birth: [birth year]  Hewitt Shorts, RN Trauma Response Nurse Phone Number: [phone] 01/27/2023, 10:51 AM     CAGE-AID Screening:    Have You Ever Felt You Ought to Cut Down on Your Drinking or Drug Use?: No Have People Annoyed You By [employer] Your Drinking Or Drug Use?: No Have You Felt Bad Or Guilty About Your Drinking Or Drug Use?: No Have You Ever Had a Drink or Used Drugs First Thing In The Morning to Steady Your Nerves or to Get Rid of a Hangover?: No CAGE-AID Score: 0  Substance Abuse Education Offered: No (Pt reports no alcohol or drug use)

## 2023-01-27 NOTE — Interval H&P Note (Signed)
History and Physical Interval Note:  01/27/2023 8:08 AM  Omer B Tomerlin  has presented today for surgery, with the diagnosis of RIGHT HIP FRACTURE.  The various methods of treatment have been discussed with the patient and family. After consideration of risks, benefits and other options for treatment, the patient has consented to  Procedure(s): ANTERIOR APPROACH HEMI HIP ARTHROPLASTY (Right) as a surgical intervention.  The patient's history has been reviewed, patient examined, no change in status, stable for surgery.  I have reviewed the patient's chart and labs.  Questions were answered to the patient's satisfaction.    The risks, benefits, and alternatives were discussed with the patient. There are risks associated with the surgery including, but not limited to, problems with anesthesia (death), infection, instability (giving out of the joint), dislocation, differences in leg length/angulation/rotation, fracture of bones, loosening or failure of implants, hematoma (blood accumulation) which may require surgical drainage, blood clots, pulmonary embolism, nerve injury (foot drop and lateral thigh numbness), and blood vessel injury. The patient understands these risks and elects to proceed.    Iline Oven Caryl Manas

## 2023-01-27 NOTE — Op Note (Signed)
OPERATIVE REPORT  SURGEON: Samson Frederic, MD   ASSISTANT: Staff.  PREOPERATIVE DIAGNOSIS: Displaced Right femoral neck fracture.   POSTOPERATIVE DIAGNOSIS: Displaced Right femoral neck fracture.   PROCEDURE: Right hip hemiarthroplasty, anterior approach.   IMPLANTS: Biomet Taperloc Complete Reduced Distal stem, size 9 x 137 mm, high offset, with a 48 mm bipolar head ball and 28 - 3 mm metal head ball.  ANESTHESIA:  General  ANTIBIOTICS: 2g ancef.  ESTIMATED BLOOD LOSS:-150 mL    DRAINS: None.  COMPLICATIONS: None   CONDITION: PACU - hemodynamically stable.   BRIEF CLINICAL NOTE: Brenda Lynch is a 89 y.o. female with a displaced Right femoral neck fracture. The patient was admitted to the hospitalist service and underwent perioperative risk stratification and medical optimization. The risks, benefits, and alternatives to hemiarthroplasty were explained, and the patient elected to proceed.  PROCEDURE IN DETAIL: The patient was taken to the operating room and general anesthesia was induced on the hospital bed.  The patient was then positioned on the Hana table.  All bony prominences were well padded.  The hip was prepped and draped in the normal sterile surgical fashion.  A time-out was called verifying side and site of surgery. Antibiotics were given within 60 minutes of beginning the procedure.   Bikini incision was made, and the direct anterior approach to the hip was performed through the Hueter interval.  Superficial dissection was carried out lateral to the ASIS. Lateral femoral circumflex vessels were treated with the Auqumantys. The anterior capsule was exposed and an inverted T capsulotomy was made. Fracture hematoma was encountered and evacuated. The patient was found to have a comminuted Right subcapital femoral neck fracture.  Inferior pubofemoral ligament was released subperiosteally to the lesser trochanter. I freshened the femoral neck cut with a saw.  I removed the  femoral neck fragment.  A corkscrew was placed into the head and the head was removed.  This was passed to the back table and was measured.   Acetabular exposure was achieved.  I examined the articular cartilage which was intact.  The labrum was intact. A 48 mm trial head was placed and found to have excellent fit.   I then gained femoral exposure taking care to protect the abductors and greater trochanter.  The superior capsule was incised longitudinally, staying lateral to the posterior border of the femoral neck. External rotation, extension, and adduction were applied.  A cookie cutter was used to enter the femoral canal, and then the femoral canal finder was used to confirm location.  I then sequentially broached up to a size 9.  Calcar planer was used on the femoral neck remnant.  I placed a high offset neck and a bipolar trial construct. The hip was reduced.  Leg lengths were checked fluoroscopically.  The hip was dislocated and trial components were removed.  I placed the real stem followed by the real bipolar articulation.  A single reduction maneuver was performed and the hip was reduced.  Fluoroscopy was used to confirm component position and leg lengths.  At 90 degrees of external rotation and extension, the hip was stable to an anterior directed force.   The wound was copiously irrigated with Prontosan solution and normal saline using pulse lavage.  Marcaine solution was injected into the periarticular soft tissue.  The wound was closed in layers using #1 Stratafix for the fascia, 2-0 Vicryl for the subcutaneous fat, 2-0 Monocryl for the deep dermal layer,and staples + Dermabond for the skin.  Once  the glue was fully dried, an Aquacell Ag dressing was applied.  The patient was then awakened from anesthesia and transported to the recovery room in stable condition.  Sponge, needle, and instrument counts were correct at the end of the case x2.  The patient tolerated the procedure well and there were  no known complications.  The aquamantis was utilized for this case to help facilitate better hemostasis as patient was felt to be at increased risk of bleeding because of complex case requiring increased OR time and/or exposure.  A oscillating saw tip was utilized for this case to prevent damage to the soft tissue structures such as muscles, ligaments and tendons, and to ensure accurate bone cuts. This patient was at increased risk for above structures due to  minimally invasive approach.  Please note that a surgical assistant was a medical necessity for this procedure to perform it in a safe and expeditious manner. Assistant was necessary to provide appropriate retraction of vital neurovascular structures, to prevent femoral fracture, and to allow for anatomic placement of the prosthesis.

## 2023-01-27 NOTE — Discharge Instructions (Signed)

## 2023-01-27 NOTE — Plan of Care (Signed)
  Problem: Clinical Measurements: Goal: Will remain free from infection Outcome: Progressing   Problem: Elimination: Goal: Will not experience complications related to bowel motility Outcome: Progressing Goal: Will not experience complications related to urinary retention Outcome: Progressing   Problem: Pain Management: Goal: General experience of comfort will improve Outcome: Progressing

## 2023-01-27 NOTE — Progress Notes (Signed)
PHARMACY NOTE:  ANTIMICROBIAL RENAL DOSAGE ADJUSTMENT  Current antimicrobial regimen includes a mismatch between antimicrobial dosage and estimated renal function.  As per policy approved by the Pharmacy & Therapeutics and Medical Executive Committees, the antimicrobial dosage will be adjusted accordingly.  Current antimicrobial dosage:  Cefazolin 2gm IV q6h x 2  Indication: Surgical prophylaxis  Renal Function:  Estimated Creatinine Clearance: 22.4 mL/min (A) (by C-G formula based on SCr of 1.08 mg/dL (H)). []      On intermittent HD, scheduled: []      On CRRT    Antimicrobial dosage has been changed to:  Cefazolin 2gm IV q12h x 2  Additional comments:   Laityn Bensen A. Jeanella Craze, PharmD, BCPS, FNKF Clinical Pharmacist [place] Please utilize Amion for appropriate phone number to reach the unit pharmacist [facility])  01/27/2023 12:00 PM

## 2023-01-27 NOTE — Transfer of Care (Signed)
Immediate Anesthesia Transfer of Care Note  Patient: Brenda Lynch  Procedure(s) Performed: ANTERIOR APPROACH HEMI HIP ARTHROPLASTY (Right)  Patient Location: PACU  Anesthesia Type:General  Level of Consciousness: sedated  Airway & Oxygen Therapy: Patient Spontanous Breathing and Patient connected to face mask oxygen  Post-op Assessment: Report given to RN and Post -op Vital signs reviewed and stable  Post vital signs: Reviewed and stable  Last Vitals:  Vitals Value Taken Time  BP 146/72 01/27/23 1018  Temp 36.8 C 01/27/23 1018  Pulse 64 01/27/23 1020  Resp 21 01/27/23 1020  SpO2 100 % 01/27/23 1020  Vitals shown include unfiled device data.  Last Pain:  Vitals:   01/27/23 0611  TempSrc: Oral         Complications: No notable events documented.

## 2023-01-27 NOTE — Plan of Care (Signed)
  Problem: Clinical Measurements: Goal: Will remain free from infection Outcome: Progressing   Problem: Elimination: Goal: Will not experience complications related to bowel motility Outcome: Progressing Goal: Will not experience complications related to urinary retention Outcome: Progressing   Problem: Pain Management: Goal: General experience of comfort will improve Outcome: Progressing   Problem: Safety: Goal: Ability to remain free from injury will improve Outcome: Progressing   Problem: Skin Integrity: Goal: Risk for impaired skin integrity will decrease Outcome: Progressing

## 2023-01-27 NOTE — Progress Notes (Signed)
[facility]  PROGRESS NOTE  Brenda Lynch KGM:[id number] DOB: 04-20-1923 DOA: 01/25/2023 PCP: Georgann Housekeeper, MD   Brief HPI:    89 y.o. female with medical history significant of diastolic heart failure preserved EF 65 to 70%, essential hypertension, transient ischemic attack, DM type II, hyperlipidemia, dementia, CAD, and CKD stage IIIb presented to emergency department for evaluation for a mechanical fall.  Patient reported she had a fall while ambulating to her bathroom landed on her right-sided hip.  Patient denies any head injury and loss of consciousness. Due to dementia at the baseline unable to relay any history with the patient.  Patient's son helped with the history.  Reported patient had a fall as mentioned above.  Since the fall patient is complaining about right-sided upper leg pain and hip joint pain. ED physician consulted and spoke with orthopedic surgeon Dr. Duwayne Heck recommended keep patient n.p.o. and will evaluate patient soon.     Assessment/Plan:   Right femoral neck fracture/status post right hip hemiarthroplasty -Management per orthopedics -DVT prophylaxis per orthopedics   Mechanical fall at home -Status post mechanical fall at home -No loss of consciousness  Hypertension/chronic diastolic heart failure -EF 65 to 70% -Currently well compensated -Blood pressure well-controlled -Continue ramipril, hydrochlorothiazide -Continue as needed hydralazine  History of CAD -Plavix on hold for  surgical intervention   Hyperlipidemia -Continue Lipitor  CKD stage IIIb -Creatinine stable at 1.16 -At baseline  Chronic rhinitis -Continue Singulair    Medications     acetaminophen  500 mg Oral Q6H   atorvastatin  20 mg Oral Daily   busPIRone  10 mg Oral QHS   busPIRone  5 mg Oral Daily   [START ON 01/28/2023] clopidogrel  75 mg Oral Daily   docusate sodium  100 mg Oral BID   hydrALAZINE       hydrochlorothiazide  12.5 mg Oral Daily    montelukast  10 mg Oral QHS   pantoprazole  40 mg Oral QAC breakfast   ramipril  10 mg Oral Daily   senna  1 tablet Oral BID     Data Reviewed:   CBG:  Recent Labs  Lab 01/27/23 0903 01/27/23 1021  GLUCAP 169* 136*    SpO2: 97 % O2 Flow Rate (L/min): 2 L/min    Vitals:   01/27/23 1100 01/27/23 1115 01/27/23 1128 01/27/23 1200  BP: (!) 162/74 (!) 141/64  128/66  Pulse: 75 80  97  Resp: 14 14  16   Temp:   98.2 F (36.8 C) 98 F (36.7 C)  TempSrc:    Oral  SpO2: 95% 97%    Weight:      Height:          Data Reviewed:  Basic Metabolic Panel: Recent Labs  Lab 01/25/23 2114 01/26/23 0617  NA 132* 134*  K 4.1 4.4  CL 99 100  CO2 23 21*  GLUCOSE 162* 144*  BUN 17 15  CREATININE 1.16* 1.08*  CALCIUM 9.2 8.7*    CBC: Recent Labs  Lab 01/25/23 2114 01/26/23 0617  WBC 11.9* 10.3  NEUTROABS 9.5*  --   HGB 11.7* 12.1  HCT 35.5* 34.2*  MCV 90.3 87.9  PLT 238 248    LFT Recent Labs  Lab 01/26/23 0617  AST 21  ALT 20  ALKPHOS 86  BILITOT 0.8  PROT 6.7  ALBUMIN 3.2*     Antibiotics: Anti-infectives (From admission, onward)    Start     Dose/Rate Route Frequency Ordered Stop  01/27/23 2000  ceFAZolin (ANCEF) IVPB 2g/100 mL premix        2 g 200 mL/hr over 30 Minutes Intravenous Every 12 hours 01/27/23 1157 01/28/23 1959   01/27/23 0731  ceFAZolin (ANCEF) 2-4 GM/100ML-% IVPB       Note to Pharmacy: Kathrene Bongo D: cabinet override      01/27/23 0731 01/27/23 0832   01/27/23 0730  ceFAZolin (ANCEF) IVPB 2g/100 mL premix        2 g 200 mL/hr over 30 Minutes Intravenous On call to O.R. 01/27/23 2130 01/27/23 8657        DVT prophylaxis: SCDs  Code Status: DNR  Family Communication: Discussed with patient's son at bedside   CONSULTS orthopedics   Subjective   Underwent partial right hip hemiarthroplasty.  Sleeping comfortably.   Objective    Physical Examination:   Somnolent, did not wake up as she was agitated before  sleeping as per son Lungs clear to auscultation bilaterally   Status is: Inpatient:          Meredeth Ide   [facility] If 7PM-7AM, please contact night-coverage at [email or web address], Office  [phone]   01/27/2023, 1:44 PM  LOS: 2 days

## 2023-01-27 NOTE — Anesthesia Procedure Notes (Signed)
Procedure Name: Intubation Date/Time: 01/27/2023 8:23 AM  Performed by: Alwyn Ren, CRNAPre-anesthesia Checklist: Patient identified, Emergency Drugs available, Suction available and Patient being monitored Patient Re-evaluated:Patient Re-evaluated prior to induction Oxygen Delivery Method: Circle system utilized Preoxygenation: Pre-oxygenation with 100% oxygen Induction Type: IV induction Ventilation: Mask ventilation without difficulty Grade View: Grade I Tube type: Oral Tube size: 7.0 mm Number of attempts: 1 Airway Equipment and Method: Stylet and Oral airway Placement Confirmation: ETT inserted through vocal cords under direct vision, positive ETCO2 and breath sounds checked- equal and bilateral Tube secured with: Tape Dental Injury: Teeth and Oropharynx as per pre-operative assessment

## 2023-01-27 NOTE — Progress Notes (Signed)
OT Cancellation Note  Patient Details Name: Brenda Lynch MRN: [id number] DOB: Nov 25, 1923   Cancelled Treatment:    Reason Eval/Treat Not Completed: Patient at procedure or test/ unavailable (in OR for hemi THA) OT will continue to follow acutely for evaluation.   Evern Bio Gladine Plude 01/27/2023, 11:25 AM  Nyoka Cowden OTR/L Acute Rehabilitation Services Office: [phone]

## 2023-01-27 NOTE — Progress Notes (Signed)
PT Cancellation Note  Patient Details Name: Brenda Lynch MRN: [id number] DOB: October 29, 1923   Cancelled Treatment:    Reason Eval/Treat Not Completed: Patient at procedure or test/unavailable  Currently off unit for procedure. Will await readiness for PT evaluation and follow-up when schedule permits.  Kathlyn Sacramento, PT, DPT [facility]  Rehabilitation Services Physical Therapist Office: [phone] Website: [email or web address]  Berton Mount 01/27/2023, 8:09 AM

## 2023-01-28 DIAGNOSIS — S72001A Fracture of unspecified part of neck of right femur, initial encounter for closed fracture: Secondary | ICD-10-CM | POA: Diagnosis not present

## 2023-01-28 DIAGNOSIS — F0390 Unspecified dementia without behavioral disturbance: Secondary | ICD-10-CM | POA: Diagnosis not present

## 2023-01-28 DIAGNOSIS — I5032 Chronic diastolic (congestive) heart failure: Secondary | ICD-10-CM | POA: Diagnosis not present

## 2023-01-28 DIAGNOSIS — N1832 Chronic kidney disease, stage 3b: Secondary | ICD-10-CM | POA: Diagnosis not present

## 2023-01-28 LAB — BASIC METABOLIC PANEL
Anion gap: 7 (ref 5–15)
BUN: 26 mg/dL — ABNORMAL HIGH (ref 8–23)
CO2: 24 mmol/L (ref 22–32)
Calcium: 8.6 mg/dL — ABNORMAL LOW (ref 8.9–10.3)
Chloride: 102 mmol/L (ref 98–111)
Creatinine, Ser: 1.24 mg/dL — ABNORMAL HIGH (ref 0.44–1.00)
GFR, Estimated: 40 mL/min — ABNORMAL LOW (ref >60)
Glucose, Bld: 118 mg/dL — ABNORMAL HIGH (ref 70–99)
Potassium: 3.9 mmol/L (ref 3.5–5.1)
Sodium: 133 mmol/L — ABNORMAL LOW (ref 135–145)

## 2023-01-28 LAB — CBC
HCT: 30.7 % — ABNORMAL LOW (ref 36.0–46.0)
Hemoglobin: 10.3 g/dL — ABNORMAL LOW (ref 12.0–15.0)
MCH: 30.1 pg (ref 26.0–34.0)
MCHC: 33.6 g/dL (ref 30.0–36.0)
MCV: 89.8 fL (ref 80.0–100.0)
Platelets: 209 10*3/uL (ref 150–400)
RBC: 3.42 MIL/uL — ABNORMAL LOW (ref 3.87–5.11)
RDW: 17.1 % — ABNORMAL HIGH (ref 11.5–15.5)
WBC: 13.8 10*3/uL — ABNORMAL HIGH (ref 4.0–10.5)
nRBC: 0 % (ref 0.0–0.2)

## 2023-01-28 MED ORDER — ENOXAPARIN SODIUM 30 MG/0.3ML IJ SOSY
30.0000 mg | PREFILLED_SYRINGE | INTRAMUSCULAR | Status: DC
  Administered 2023-01-28 – 2023-01-29 (×2): 30 mg via SUBCUTANEOUS
  Filled 2023-01-28 (×2): qty 0.3

## 2023-01-28 MED ORDER — INFLUENZA VAC A&B SURF ANT ADJ 0.5 ML IM SUSY
0.5000 mL | PREFILLED_SYRINGE | INTRAMUSCULAR | Status: AC
  Administered 2023-01-29: 0.5 mL via INTRAMUSCULAR
  Filled 2023-01-28: qty 0.5

## 2023-01-28 MED ORDER — ENOXAPARIN SODIUM 40 MG/0.4ML IJ SOSY: 40.0000 mg | PREFILLED_SYRINGE | INTRAMUSCULAR | Status: DC

## 2023-01-28 MED ORDER — LACTATED RINGERS IV SOLN: INTRAVENOUS | Status: AC

## 2023-01-28 NOTE — Plan of Care (Signed)
  Problem: Clinical Measurements: Goal: Will remain free from infection Outcome: Progressing Goal: Cardiovascular complication will be avoided Outcome: Progressing   Problem: Coping: Goal: Level of anxiety will decrease Outcome: Progressing

## 2023-01-28 NOTE — Progress Notes (Signed)
[facility]  PROGRESS NOTE  Brenda Lynch NUU:[id number] DOB: Aug 10, 1923 DOA: 01/25/2023 PCP: Georgann Housekeeper, MD   Brief HPI:    89 y.o. female with medical history significant of diastolic heart failure preserved EF 65 to 70%, essential hypertension, transient ischemic attack, DM type II, hyperlipidemia, dementia, CAD, and CKD stage IIIb presented to emergency department for evaluation for a mechanical fall.  Patient reported she had a fall while ambulating to her bathroom landed on her right-sided hip.  Patient denies any head injury and loss of consciousness. Due to dementia at the baseline unable to relay any history with the patient.  Patient's son helped with the history.  Reported patient had a fall as mentioned above.  Since the fall patient is complaining about right-sided upper leg pain and hip joint pain. ED physician consulted and spoke with orthopedic surgeon Dr. Duwayne Heck recommended keep patient n.p.o. and will evaluate patient soon.     Assessment/Plan:   Right femoral neck fracture/status post right hip hemiarthroplasty -Management per orthopedics -DVT prophylaxis per orthopedics -Will start Lovenox for DVT prophylaxis today, will discuss with Ortho regarding long-term prophylaxis after discharge   Mechanical fall at home -Status post mechanical fall at home -No loss of consciousness  Hypertension/chronic diastolic heart failure -EF 65 to 70% -Currently well compensated -Blood pressure well-controlled -Continue ramipril, hydrochlorothiazide -Continue as needed hydralazine  History of CAD -Plavix restarted today   Hyperlipidemia -Continue Lipitor  Acute on CKD stage IIIb -Creatinine is elevated at 1.24 -Start gentle IV hydration with LR  Chronic rhinitis -Continue Singulair    Medications     acetaminophen  500 mg Oral Q6H   atorvastatin  20 mg Oral Daily   busPIRone  10 mg Oral QHS   busPIRone  5 mg Oral Daily   clopidogrel  75 mg Oral  Daily   docusate sodium  100 mg Oral BID   hydrochlorothiazide  12.5 mg Oral Daily   montelukast  10 mg Oral QHS   pantoprazole  40 mg Oral QAC breakfast   ramipril  10 mg Oral Daily   senna  1 tablet Oral BID     Data Reviewed:   CBG:  Recent Labs  Lab 01/27/23 0903 01/27/23 1021  GLUCAP 169* 136*    SpO2: 99 % O2 Flow Rate (L/min): 2 L/min    Vitals:   01/27/23 2151 01/28/23 0100 01/28/23 0530 01/28/23 0815  BP: 116/60 137/61 (!) 122/59 119/78  Pulse: 79 78 72   Resp: 18 16 20 18   Temp: 98.1 F (36.7 C) 98 F (36.7 C) 97.8 F (36.6 C) 98.7 F (37.1 C)  TempSrc:    Oral  SpO2: 100% 100% 99%   Weight:      Height:          Data Reviewed:  Basic Metabolic Panel: Recent Labs  Lab 01/25/23 2114 01/26/23 0617 01/28/23 0554  NA 132* 134* 133*  K 4.1 4.4 3.9  CL 99 100 102  CO2 23 21* 24  GLUCOSE 162* 144* 118*  BUN 17 15 26*  CREATININE 1.16* 1.08* 1.24*  CALCIUM 9.2 8.7* 8.6*    CBC: Recent Labs  Lab 01/25/23 2114 01/26/23 0617 01/28/23 0554  WBC 11.9* 10.3 13.8*  NEUTROABS 9.5*  --   --   HGB 11.7* 12.1 10.3*  HCT 35.5* 34.2* 30.7*  MCV 90.3 87.9 89.8  PLT 238 248 209    LFT Recent Labs  Lab 01/26/23 0617  AST 21  ALT 20  ALKPHOS 86  BILITOT 0.8  PROT 6.7  ALBUMIN 3.2*     Antibiotics: Anti-infectives (From admission, onward)    Start     Dose/Rate Route Frequency Ordered Stop   01/27/23 2000  ceFAZolin (ANCEF) IVPB 2g/100 mL premix        2 g 200 mL/hr over 30 Minutes Intravenous Every 12 hours 01/27/23 1157 01/28/23 1959   01/27/23 0731  ceFAZolin (ANCEF) 2-4 GM/100ML-% IVPB       Note to Pharmacy: Kathrene Bongo D: cabinet override      01/27/23 0731 01/27/23 0832   01/27/23 0730  ceFAZolin (ANCEF) IVPB 2g/100 mL premix        2 g 200 mL/hr over 30 Minutes Intravenous On call to O.R. 01/27/23 0981 01/27/23 0832        DVT prophylaxis: SCDs  Code Status: DNR  Family Communication: Discussed with patient's son  at bedside   CONSULTS orthopedics   Subjective   Denies pain this morning   Objective    Physical Examination:  General-appears in no acute distress Heart-S1-S2, regular, no murmur auscultated Lungs-clear to auscultation bilaterally, no wheezing or crackles auscultated Abdomen-soft, nontender, no organomegaly Extremities-no edema in the lower extremities Neuro-alert, following commands, answering questions appropriately   Status is: Inpatient:          Meredeth Ide   [facility] If 7PM-7AM, please contact night-coverage at [email or web address], Office  [phone]   01/28/2023, 9:24 AM  LOS: 3 days

## 2023-01-28 NOTE — Progress Notes (Signed)
PHARMACY NOTE -   RENAL DOSE ADJUSTMENT    Patient has been initiated on Lovenox f40 mg subcutaneous q24hr for DVT prophylaxis. SCr 1.24, estimated CrCl 20 ml/min  Recommend: Lovenox adjusted to 30 mg subcutaneous q24h  Jeanella Cara, PharmD, [facility] Clinical Pharmacist Please see AMION for all Pharmacists' Contact Phone Numbers 01/28/2023, 1:29 PM

## 2023-01-28 NOTE — Progress Notes (Signed)
Subjective: 1 Day Post-Op Procedure(s) (LRB): ANTERIOR APPROACH HEMI HIP ARTHROPLASTY (Right) Patient reports pain as mild.  Her son is at bedside.  He reports that she has had a quiet morning.  She had some toast for breakfast.  Objective: Vital signs in last 24 hours: Temp:  [97.8 F (36.6 C)-98.7 F (37.1 C)] 98.7 F (37.1 C) (11/10 0815) Pulse Rate:  [72-97] 72 (11/10 0530) Resp:  [14-20] 18 (11/10 0815) BP: (116-141)/(59-78) 119/78 (11/10 0815) SpO2:  [97 %-100 %] 99 % (11/10 0530)  Intake/Output from previous day: 11/09 0701 - 11/10 0700 In: 1426.3 [I.V.:1076.3; IV Piggyback:350] Out: 150 [Blood:150] Intake/Output this shift: No intake/output data recorded.  Recent Labs    01/25/23 2114 01/26/23 0617 01/28/23 0554  HGB 11.7* 12.1 10.3*   Recent Labs    01/26/23 0617 01/28/23 0554  WBC 10.3 13.8*  RBC 3.89 3.42*  HCT 34.2* 30.7*  PLT 248 209   Recent Labs    01/26/23 0617 01/28/23 0554  NA 134* 133*  K 4.4 3.9  CL 100 102  CO2 21* 24  BUN 15 26*  CREATININE 1.08* 1.24*  GLUCOSE 144* 118*  CALCIUM 8.7* 8.6*   No results for input(s): "LABPT", "INR" in the last 72 hours.  Physical exam: Elderly woman resting comfortably in bed.  Right hip surgical sites are dressed and dry.  Active plantarflexion and dorsiflexion of the toes and foot.  1+ dorsalis pedis pulse.   Assessment/Plan: 1 Day Post-Op Procedure(s) (LRB): ANTERIOR APPROACH HEMI HIP ARTHROPLASTY (Right) Up with therapy weightbearing as tolerated on the right lower extremity.      Toni Arthurs 01/28/2023, 11:12 AM

## 2023-01-28 NOTE — Progress Notes (Signed)
Physical Therapy Note  PT eval complete with full note to follow;  In considering options for discharge, I value going back to familiar environment, caregivers, and routines for patients with dementia.   Recommend DC back home for transition out of hospital; Son is in agreement;  Will request [facility] PT follow up and a hospital bed;   Van Clines, PT  Acute Rehabilitation Services  Office [phone]

## 2023-01-28 NOTE — Evaluation (Signed)
Physical Therapy Evaluation Patient Details Name: Brenda Lynch MRN: [id number] DOB: 1923/03/29 Today's Date: 01/28/2023  History of Present Illness  89 yo female s/p fall R hip hemiarthroplasy  PMH CHF HTN TIA Dm2 HLD Demenia CAD CKDIII meningioma,  Clinical Impression  Pt admitted with above diagnosis. Lives at home, family has arranged for caregivers and themselves to provide around the clock assist for pt; Pt at baseline walks with rollator with family following behind her closely. Pt's family uses gait belt as well. Pt is able to self feed meal (using spoon and fingers) which son is providing due to diet modification concerns. Pt observed with 100% intake of lunch provided at the end of session. Presents to PT with Difficulty with functional mobility; Pt with poor tolerance for mobility and increased impulsive behaviors ( such as resistance with hands pushing. Increased voice tone/ frequency); Pt currently with functional limitations due to the deficits listed below (see PT Problem List). Pt will benefit from skilled PT to increase their independence and safety with mobility to allow discharge to the venue listed below.    In considering options for discharge, I value going back to familiar environment, caregivers, and routines for patients with dementia.   Recommend a hospital bed and consideration for non-emergent ambulance transport home         If plan is discharge home, recommend the following: A lot of help with walking and/or transfers;A lot of help with bathing/dressing/bathroom;Assistance with cooking/housework;Assistance with feeding;Assist for transportation;Supervision due to cognitive status   Can travel by private vehicle        Equipment Recommendations Hospital bed;Other (comment) (also worth considering non-emergent ambulance transport home, or medical Zenaida Niece transport)  Recommendations for Other Services       Functional Status Assessment Patient has had a recent  decline in their functional status and demonstrates the ability to make significant improvements in function in a reasonable and predictable amount of time.     Precautions / Restrictions Precautions Precautions: Fall Restrictions Weight Bearing Restrictions: No      Mobility  Bed Mobility Overal bed mobility: Needs Assistance Bed Mobility: Supine to Sit, Sit to Supine     Supine to sit: Total assist, +2 for safety/equipment, +2 for physical assistance Sit to supine: Total assist, +2 for physical assistance, +2 for safety/equipment   General bed mobility comments: helicopter to eOB with pt extending bil UE. pt with blanket placed prior to help shield therapist for safety. pt at EOB posterior lean and resistant. Pt unable to sustain eob and returned to supine with pad adjusted    Transfers                        Ambulation/Gait                  Stairs            Wheelchair Mobility     Tilt Bed    Modified Rankin (Stroke Patients Only)       Balance Overall balance assessment: Needs assistance Sitting-balance support: No upper extremity supported Sitting balance-Leahy Scale: Zero                                       Pertinent Vitals/Pain Pain Assessment Pain Assessment: PAINAD Breathing: normal Negative Vocalization: none Facial Expression: sad, frightened, frown Body Language: tense, distressed pacing, fidgeting Consolability: distracted  or reassured by voice/touch PAINAD Score: 3 Pain Location: does not indicate Pain Descriptors / Indicators: Discomfort, Grimacing Pain Intervention(s): Monitored during session    Home Living Family/patient expects to be discharged to:: Private residence Living Arrangements: Children Available Help at Discharge: Family;Available 24 hours/day Type of Home: House Home Access: Stairs to enter Entrance Stairs-Rails: Can reach both Entrance Stairs-Number of Steps: 3 steps onto  patio   Home Layout: Multi-level;Able to live on main level with bedroom/bathroom Home Equipment: Lift chair;Rollator (4 wheels);Shower seat Additional Comments: Brenda Lynch caregiver comes at 10:30Am  (4 -5 hours for 3 days) other son there 3  hours (2 days week) and then lives with son Brenda Lynch full time    Prior Function Prior Level of Function : Needs assist  Cognitive Assist : ADLs (cognitive);Mobility (cognitive) Mobility (Cognitive): Step by step cues ADLs (Cognitive): Intermittent cues       Mobility Comments: fall s/p in august 2023 ADLs Comments: food channel and  youtube vidos of little babies are calming to her. Likes babies     Extremity/Trunk Assessment   Upper Extremity Assessment Upper Extremity Assessment: Right hand dominant    Lower Extremity Assessment Lower Extremity Assessment: RLE deficits/detail;Difficult to assess due to impaired cognition RLE Deficits / Details: Decr tolerance of RLE movement, active or passive       Communication   Communication Communication: Hearing impairment (wears hearing aids)  Cognition Arousal: Alert Behavior During Therapy: Agitated, Restless, Impulsive Overall Cognitive Status: History of cognitive impairments - at baseline                                 General Comments: pt ringing her hands together showing increased agitation. pt starting to sing and redirect conversation. pt does attempt to hit with tactile input from therapist and does not follow commands of son. pt initiates self feeding with presentation and with cues to slow down continues. pt requires food removed to allow for pauses in self feeding        General Comments General comments (skin integrity, edema, etc.): incision dry and intact at this time. pt with purewick and diaper on in bed.    Exercises     Assessment/Plan    PT Assessment Patient needs continued PT services  PT Problem List Decreased strength;Decreased range of motion;Decreased  activity tolerance;Decreased balance;Decreased mobility;Decreased coordination;Decreased cognition;Decreased knowledge of use of DME;Decreased safety awareness;Decreased knowledge of precautions;Pain       PT Treatment Interventions DME instruction;Gait training;Functional mobility training;Therapeutic activities;Therapeutic exercise;Balance training;Neuromuscular re-education;Cognitive remediation;Patient/family education;Wheelchair mobility training    PT Goals (Current goals can be found in the Care Plan section)  Acute Rehab PT Goals Patient Stated Goal: Unable to state PT Goal Formulation: With patient/family Time For Goal Achievement: 02/11/23 Potential to Achieve Goals: Fair    Frequency Min 1X/week     Co-evaluation PT/OT/SLP Co-Evaluation/Treatment: Yes Reason for Co-Treatment: Necessary to address cognition/behavior during functional activity;For patient/therapist safety;To address functional/ADL transfers PT goals addressed during session: Mobility/safety with mobility OT goals addressed during session: ADL's and self-care;Proper use of Adaptive equipment and DME;Strengthening/ROM       AM-PAC PT "6 Clicks" Mobility  Outcome Measure Help needed turning from your back to your side while in a flat bed without using bedrails?: Total Help needed moving from lying on your back to sitting on the side of a flat bed without using bedrails?: Total Help needed moving to and from a bed  to a chair (including a wheelchair)?: Total Help needed standing up from a chair using your arms (e.g., wheelchair or bedside chair)?: Total Help needed to walk in hospital room?: Total Help needed climbing 3-5 steps with a railing? : Total 6 Click Score: 6    End of Session Equipment Utilized During Treatment: Other (comment) (bed pads) Activity Tolerance: Patient tolerated treatment well Patient left: in bed;with call bell/phone within reach;with family/visitor present;Other (comment) (Bed in  chair position) Nurse Communication: Mobility status PT Visit Diagnosis: Other abnormalities of gait and mobility (R26.89);History of falling (Z91.81);Difficulty in walking, not elsewhere classified (R26.2);Pain Pain - Right/Left: Right Pain - part of body: Hip    Time: 4401-0272 PT Time Calculation (min) (ACUTE ONLY): 38 min   Charges:   PT Evaluation $PT Eval Moderate Complexity: 1 Mod PT Treatments $Therapeutic Activity: 8-22 mins PT General Charges $$ ACUTE PT VISIT: 1 Visit         Van Clines, PT  Acute Rehabilitation Services Office [phone] Secure Chat welcomed   Levi Aland 01/28/2023, 6:39 PM

## 2023-01-28 NOTE — Evaluation (Signed)
Occupational Therapy Evaluation Patient Details Name: Brenda Lynch MRN: [id number] DOB: 10-14-1923 Today's Date: 01/28/2023   History of Present Illness 89 yo female s/p fall R hip hemiarthroplasy  PMH CHF HTN TIA Dm2 HLD Demenia CAD CKDIII meningioma,   Clinical Impression   Patient is s/p R hip hemiarthoplasty surgery resulting in functional limitations due to the deficits listed below (see OT problem list). Pt at baseline walks with rollator with family following behind her closely. Pt's family uses gait belt as well. Pt is able to self feed meal (using spoon and fingers) which son is providing due to diet modification concerns. Pt observed with 100% intake of lunch provided at the end of session. Pt with poor tolerance for mobility and increased impulsive behaviors ( such as resistance with hands pushing. Increased voice tone/ frequency) Patient will benefit from skilled OT acutely to increase independence and safety with ADLS to allow discharge skilled inpatient follow up therapy, <3 hours/day. (Family will need lift equipment and hospital bed if to d/c home and declines SNF level care)        If plan is discharge home, recommend the following: Two people to help with walking and/or transfers;Two people to help with bathing/dressing/bathroom    Functional Status Assessment  Patient has had a recent decline in their functional status and demonstrates the ability to make significant improvements in function in a reasonable and predictable amount of time.  Equipment Recommendations  Wheelchair (measurements OT);Wheelchair cushion (measurements OT);Hoyer lift    Recommendations for [employer]       Precautions / Restrictions Precautions Precautions: Fall Restrictions Weight Bearing Restrictions: No      Mobility Bed Mobility Overal bed mobility: Needs Assistance Bed Mobility: Supine to Sit, Sit to Supine     Supine to sit: Total assist, +2 for safety/equipment, +2 for  physical assistance Sit to supine: Total assist, +2 for physical assistance, +2 for safety/equipment   General bed mobility comments: helicopter to eOB with pt extending bil UE. pt with blanket placed prior to help shield therapist for safety. pt at EOB posterior lean and resistant. Pt unable to sustain eob and returned to supine with pad adjusted    Transfers                          Balance Overall balance assessment: Needs assistance Sitting-balance support: No upper extremity supported Sitting balance-Leahy Scale: Zero                                     ADL either performed or assessed with clinical judgement   ADL Overall ADL's : Needs assistance/impaired Eating/Feeding: Minimal assistance Eating/Feeding Details (indicate cue type and reason): mod cues to pace feeding due to quickly eating. son reports pt not eating since Thursday so he was helping pace food intake   Grooming Details (indicate cue type and reason): refuses attempts Upper Body Bathing: Maximal assistance   Lower Body Bathing: Total assistance   Upper Body Dressing : Maximal assistance   Lower Body Dressing: Total assistance                 General ADL Comments: pt keeping BIL ue adducted to the body and difficult to get gown or blanket under arms. pt reports Im going to bed with increased tone and frequency showing her dislike for a new gown. pt reports its wet.  The gown was cool to touch so warm blanke laid over the patient at the same time to resolve new gown issue.     Vision Baseline Vision/History: 1 Wears glasses (bifocals) Ability to See in Adequate Light: 1 Impaired Vision Assessment?: Yes     Perception         Praxis         Pertinent Vitals/Pain Pain Assessment Pain Assessment: PAINAD Breathing: normal Negative Vocalization: none Facial Expression: sad, frightened, frown Body Language: tense, distressed pacing, fidgeting Consolability: distracted or  reassured by voice/touch PAINAD Score: 3 Pain Location: does not indicate Pain Descriptors / Indicators: Discomfort, Grimacing Pain Intervention(s): Monitored during session, Repositioned, Premedicated before session, Limited activity within patient's tolerance     Extremity/Trunk Assessment Upper Extremity Assessment Upper Extremity Assessment: Right hand dominant   Lower Extremity Assessment Lower Extremity Assessment: Defer to PT evaluation       Communication Communication Communication: Hearing impairment (wears hearing aids)   Cognition Arousal: Alert Behavior During Therapy: Agitated, Restless, Impulsive Overall Cognitive Status: History of cognitive impairments - at baseline                                 General Comments: pt ringing her hands together showing increased agitation. pt starting to sing and redirect conversation. pt does attempt to hit with tactile input from therapist and does not follow commands of son. pt initiates self feeding with presentation and with cues to slow down continues. pt requires food removed to allow for pauses in self feeding     General Comments  incision dry and intact at this time. pt with purewick and diaper on in bed.    Exercises     Shoulder Instructions      Home Living Family/patient expects to be discharged to:: Private residence Living Arrangements: Children Available Help at Discharge: Family;Available 24 hours/day Type of Home: House Home Access: Stairs to enter [employer] of Steps: 3 steps onto patio Entrance Stairs-Rails: Can reach both Home Layout: Multi-level;Able to live on main level with bedroom/bathroom     Bathroom Shower/Tub: Tub/shower unit;Sponge bathes at baseline   Bathroom Toilet: Handicapped height Bathroom Accessibility: Yes   Home Equipment: Tourist information centre manager (4 wheels);Shower seat   Additional Comments: [name] caregiver comes at 10:30Am  (4 -5 hours for 3 days)  other son there 3  hours (2 days week) and then lives with son [name] full time      Prior Functioning/Environment Prior Level of Function : Needs assist  Cognitive Assist : ADLs (cognitive);Mobility (cognitive) Mobility (Cognitive): Step by step cues ADLs (Cognitive): Intermittent cues       Mobility Comments: fall s/p in august 2023 ADLs Comments: food channel and  youtube vidos of little babies are calming to her. Likes babies        OT Problem List: Decreased strength;Decreased activity tolerance;Impaired balance (sitting and/or standing);Decreased cognition;Decreased safety awareness;Decreased knowledge of use of DME or AE;Decreased knowledge of precautions;Cardiopulmonary status limiting activity      OT Treatment/Interventions: Self-care/ADL training;DME and/or AE instruction;Therapeutic activities;Cognitive remediation/compensation;Patient/family education;Balance training;Modalities;Manual therapy;Therapeutic exercise    OT Goals(Current goals can be found in the care plan section) Acute Rehab OT Goals Patient Stated Goal: to be abel to return home and go to church OT Goal Formulation: With patient/family Time For Goal Achievement: 02/11/23 Potential to Achieve Goals: Good  OT Frequency: Min 1X/week    Co-evaluation PT/OT/SLP Co-Evaluation/Treatment: Yes Reason  for Co-Treatment: Necessary to address cognition/behavior during functional activity;For patient/therapist safety;To address functional/ADL transfers   OT goals addressed during session: ADL's and self-care;Proper use of Adaptive equipment and DME;Strengthening/ROM      AM-PAC OT "6 Clicks" Daily Activity     Outcome Measure Help from another person eating meals?: A Little Help from another person taking care of personal grooming?: A Lot Help from another person toileting, which includes using toliet, bedpan, or urinal?: Total Help from another person bathing (including washing, rinsing, drying)?: Total Help  from another person to put on and taking off regular upper body clothing?: A Lot Help from another person to put on and taking off regular lower body clothing?: Total 6 Click Score: 10   End of Session Nurse Communication: Mobility status;Precautions;Need for lift equipment  Activity Tolerance: Patient tolerated treatment well Patient left: in bed;with call bell/phone within reach;with bed alarm set  OT Visit Diagnosis: Unsteadiness on feet (R26.81)                Time: 1610-9604 OT Time Calculation (min): 38 min Charges:  OT General Charges $OT Visit: 1 Visit OT Evaluation $OT Eval Moderate Complexity: 1 Mod   Brynn, OTR/L  Acute Rehabilitation Services Office: [phone] .   Mateo Flow 01/28/2023, 2:51 PM

## 2023-01-29 ENCOUNTER — Encounter (HOSPITAL_COMMUNITY): Payer: Self-pay | Admitting: Orthopedic Surgery

## 2023-01-29 DIAGNOSIS — N1832 Chronic kidney disease, stage 3b: Secondary | ICD-10-CM | POA: Diagnosis not present

## 2023-01-29 DIAGNOSIS — I5032 Chronic diastolic (congestive) heart failure: Secondary | ICD-10-CM | POA: Diagnosis not present

## 2023-01-29 DIAGNOSIS — S72001A Fracture of unspecified part of neck of right femur, initial encounter for closed fracture: Secondary | ICD-10-CM | POA: Diagnosis not present

## 2023-01-29 DIAGNOSIS — W19XXXA Unspecified fall, initial encounter: Secondary | ICD-10-CM | POA: Diagnosis not present

## 2023-01-29 LAB — CBC
HCT: 29.6 % — ABNORMAL LOW (ref 36.0–46.0)
Hemoglobin: 9.8 g/dL — ABNORMAL LOW (ref 12.0–15.0)
MCH: 30 pg (ref 26.0–34.0)
MCHC: 33.1 g/dL (ref 30.0–36.0)
MCV: 90.5 fL (ref 80.0–100.0)
Platelets: 214 10*3/uL (ref 150–400)
RBC: 3.27 MIL/uL — ABNORMAL LOW (ref 3.87–5.11)
RDW: 16.8 % — ABNORMAL HIGH (ref 11.5–15.5)
WBC: 9.9 10*3/uL (ref 4.0–10.5)
nRBC: 0 % (ref 0.0–0.2)

## 2023-01-29 LAB — BASIC METABOLIC PANEL
Anion gap: 10 (ref 5–15)
BUN: 22 mg/dL (ref 8–23)
CO2: 20 mmol/L — ABNORMAL LOW (ref 22–32)
Calcium: 8.4 mg/dL — ABNORMAL LOW (ref 8.9–10.3)
Chloride: 104 mmol/L (ref 98–111)
Creatinine, Ser: 1.17 mg/dL — ABNORMAL HIGH (ref 0.44–1.00)
GFR, Estimated: 42 mL/min — ABNORMAL LOW (ref >60)
Glucose, Bld: 122 mg/dL — ABNORMAL HIGH (ref 70–99)
Potassium: 4.1 mmol/L (ref 3.5–5.1)
Sodium: 134 mmol/L — ABNORMAL LOW (ref 135–145)

## 2023-01-29 MED ORDER — HYDROCODONE-ACETAMINOPHEN 5-325 MG PO TABS: 1.0000 | ORAL_TABLET | ORAL | 0 refills | Status: AC | PRN

## 2023-01-29 MED ORDER — ASPIRIN 81 MG PO CHEW: 81.0000 mg | CHEWABLE_TABLET | Freq: Two times a day (BID) | ORAL | 0 refills | Status: AC

## 2023-01-29 NOTE — Discharge Planning (Signed)
Patient alert and oriented. IV access removed. Discharge teaching provided to son, [name] and caregiver at bedside. Patient will be transported via [facility] for private transportation home

## 2023-01-29 NOTE — Care Management Important Message (Signed)
Important Message  Patient Details  Name: Brenda Lynch MRN: [id number] Date of Birth: Jan 21, 1924   Important Message Given:  Yes - Medicare IM     Sherilyn Banker 01/29/2023, 2:36 PM

## 2023-01-29 NOTE — TOC Transition Note (Signed)
Transition of Care [facility]) - CM/SW Discharge Note   Patient Details  Name: Brenda Lynch MRN: [id number] Date of Birth: [birth year]  [facility] CM/SW Contact:  Epifanio Lesches, RN Phone Number: 01/29/2023, 3:02 PM   Clinical Narrative:    Patient will DC to: home Anticipated DC date: 01/29/2023 Family notified: yes Transport by: [facility]  Right femoral neck fracture/status post right hip hemiarthroplasty, hx of CHF, essential hypertension, TIA, DM type II, hyperlipidemia, dementia, CAD, and CKD.  Per MD patient ready for DC today. RN, patient, and  patient's son [name] aware of DC. Pt with 24/7 supervision and assistance, sons and care giver.  Home health and DME needs noted.   Pt 's son agreeable to home health services and DME hospital bed. Son without provider preference. Referral made with Amy / Iantha Fallen [facility] for home health PT services and accepted  DME : hospital bed and transport chair referral made and will be delivered to home on tomorrow. Pt can d/c prior to delivery of hospital bed. Post hospital f/u noted on AVS. Son without RX med concerns.  [facility]/ ambulance transport requested for patient.   RNCM will sign off for now as intervention is no longer needed. Please consult [place] again if new needs arise.      Barriers to Discharge: No Barriers Identified   Patient Goals and CMS Choice   Choice offered to / list presented to : Adult Children  Discharge Placement                         Discharge Plan and Services Additional resources added to the After Visit Summary for                  DME Arranged: Hospital bed DME Agency: [employer] Date DME Agency Contacted: 01/29/23 Time DME Agency Contacted: 1500 Representative spoke with at DME Agency: Ian Malkin HH Arranged: PT HH Agency: [facility] Date [facility] Agency Contacted: 01/29/23 Time HH Agency Contacted: 1501 Representative spoke with at [facility] Agency: Amy  Social Determinants of Health (SDOH)  Interventions SDOH Screenings   Food Insecurity: No Food Insecurity (01/26/2023)  Housing: Low Risk  (01/26/2023)  Transportation Needs: No Transportation Needs (01/26/2023)  Utilities: Not At Risk (01/26/2023)  Depression (PHQ2-9): Low Risk  (03/27/2019)  Tobacco Use: Medium Risk (01/27/2023)     Readmission Risk Interventions     No data to display

## 2023-01-29 NOTE — Progress Notes (Signed)
    Durable Medical Equipment  (From admission, onward)           Start     Ordered   01/29/23 1431  For home use only DME Hospital bed  Once       Question Answer Comment  Length of Need Lifetime   Patient has (list medical condition): Closed displaced fracture of right femoral neck   The above medical condition requires: Patient requires the ability to reposition frequently   Head must be elevated greater than: 30 degrees   Bed type Semi-electric   Hoyer Lift Yes   Support Surface: Gel Overlay      01/29/23 1431

## 2023-01-29 NOTE — Plan of Care (Signed)
  Problem: Activity: Goal: Risk for activity intolerance will decrease Outcome: Progressing   Problem: Nutrition: Goal: Adequate nutrition will be maintained Outcome: Progressing   Problem: Safety: Goal: Ability to remain free from injury will improve Outcome: Progressing   

## 2023-01-29 NOTE — Progress Notes (Signed)
    Subjective: Patient has dementia at baseline. She is slightly confused this morning.  Family at bedside.  She will follow some commands.  No pain reported by patient or family.   Objective:   VITALS:   Vitals:   01/28/23 1436 01/28/23 1949 01/29/23 0502 01/29/23 0818  BP: 136/84 (!) 135/58 (!) 150/76 138/64  Pulse:  71 96 85  Resp: 18 18 19 16   Temp: 98.6 F (37 C) 98.2 F (36.8 C) 100 F (37.8 C) 97.6 F (36.4 C)  TempSrc: Oral Axillary Oral Oral  SpO2:  97% 97% 93%  Weight:      Height:        NAD ABD soft Neurovascular intact Sensation intact distally Intact pulses distally Dorsiflexion/Plantar flexion intact Incision: dressing C/D/I No cellulitis present Compartment soft   Lab Results  Component Value Date   WBC 9.9 01/29/2023   HGB 9.8 (L) 01/29/2023   HCT 29.6 (L) 01/29/2023   MCV 90.5 01/29/2023   PLT 214 01/29/2023   BMET    Component Value Date/Time   NA 134 (L) 01/29/2023 0641   NA 140 08/02/2021 1505   K 4.1 01/29/2023 0641   CL 104 01/29/2023 0641   CO2 20 (L) 01/29/2023 0641   GLUCOSE 122 (H) 01/29/2023 0641   BUN 22 01/29/2023 0641   BUN 25 08/02/2021 1505   CREATININE 1.17 (H) 01/29/2023 0641   CREATININE 0.95 (H) 01/28/2016 1033   CALCIUM 8.4 (L) 01/29/2023 0641   EGFR 44 (L) 08/02/2021 1505   GFRNONAA 42 (L) 01/29/2023 0641     Assessment/Plan: 2 Days Post-Op   Principal Problem:   Closed displaced fracture of right femoral neck (HCC) Active Problems:   Essential hypertension   Hyperlipidemia   Chronic diastolic CHF (congestive heart failure) (HCC)   History of TIA (transient ischemic attack)   Dementia without behavioral disturbance (HCC)   Fall at home, initial encounter   History of CAD (coronary artery disease)   CKD (chronic kidney disease) stage 3, GFR 30-59 ml/min (HCC)   WBAT with walker DVT ppx: Aspirin and and plavix , SCDs, TEDS PO pain control PT/OT: Continue as tolerated.  Dispo: Patient under care  of the medical team, disposition per their recommendation. Discussion about d/c home with family assist. Pain medication and DVT ppx sent to pharmacy.    Clois Dupes, PA-C 01/29/2023, 2:10 PM   [facility]  [place] [address], [address], [place], [place] [address] Phone: [phone] [email or web address] Facebook  [employer]

## 2023-01-29 NOTE — Progress Notes (Signed)
    Durable Medical Equipment  (From admission, onward)           Start     Ordered   01/29/23 1553  For home use only DME Other see comment  Once       Comments: Transport chair  Question:  Length of Need  Answer:  12 Months   01/29/23 1553   01/29/23 1431  For home use only DME Hospital bed  Once       Question Answer Comment  Length of Need Lifetime   Patient has (list medical condition): Closed displaced fracture of right femoral neck   The above medical condition requires: Patient requires the ability to reposition frequently   Head must be elevated greater than: 30 degrees   Bed type Semi-electric   Hoyer Lift Yes   Support Surface: Gel Overlay      01/29/23 1431

## 2023-01-29 NOTE — Discharge Summary (Signed)
Physician Discharge Summary   Patient: Brenda Lynch MRN: [id number] DOB: 1923-10-23  Admit date:     01/25/2023  Discharge date: 01/29/23  Discharge Physician: Meredeth Ide   PCP: Georgann Housekeeper, MD   Recommendations at discharge:   Follow-up with PCP in 1 week Follow-up orthopedics in 2 weeks Patient to go home with home health PT and hospital bed  Discharge Diagnoses: Principal Problem:   Closed displaced fracture of right femoral neck (HCC) Active Problems:   Fall at home, initial encounter   Essential hypertension   Hyperlipidemia   Chronic diastolic CHF (congestive heart failure) (HCC)   History of TIA (transient ischemic attack)   Dementia without behavioral disturbance (HCC)   History of CAD (coronary artery disease)   CKD (chronic kidney disease) stage 3, GFR 30-59 ml/min (HCC)  Resolved Problems:   * No resolved hospital problems. *  Hospital Course:  89 y.o. female with medical history significant of diastolic heart failure preserved EF 65 to 70%, essential hypertension, transient ischemic attack, DM type II, hyperlipidemia, dementia, CAD, and CKD stage IIIb presented to emergency department for evaluation for a mechanical fall.  Patient reported she had a fall while ambulating to her bathroom landed on her right-sided hip.  Patient denies any head injury and loss of consciousness. Due to dementia at the baseline unable to relay any history with the patient.  Patient's son helped with the history.  Reported patient had a fall as mentioned above.  Since the fall patient is complaining about right-sided upper leg pain and hip joint pain. ED physician consulted and spoke with orthopedic surgeon Dr. Duwayne Heck recommended keep patient n.p.o. and will evaluate patient soon.   Assessment and Plan:  Right femoral neck fracture/status post right hip hemiarthroplasty -Management per orthopedics -DVT prophylaxis per orthopedics -Patient started on aspirin 81 mg p.o. twice  daily per orthopedics     Mechanical fall at home -Status post mechanical fall at home -No loss of consciousness   Hypertension/chronic diastolic heart failure -EF 65 to 70% -Currently well compensated -Blood pressure well-controlled -Continue ramipril, hydrochlorothiazide -Continue as needed hydralazine   History of CAD -Plavix has been restarted     Hyperlipidemia -Continue Lipitor   Acute on CKD stage IIIb -Resolved, back to baseline -Creatinine is stable at 1.17   Chronic rhinitis -Continue Singulair        Consultants: Orthopedics Procedures performed:  Disposition: Home Diet recommendation:  Discharge Diet Orders (From admission, onward)     Start     Ordered   01/29/23 0000  Diet - low sodium heart healthy        01/29/23 1407           Regular diet DISCHARGE MEDICATION: Allergies as of 01/29/2023       Reactions   Ciprofloxacin Nausea Only, Other (See Comments)   GI intolerance Other reaction(s): GI intol   Doxycycline Hyclate Other (See Comments)   BS elev, Head pressure   Penicillins Hives, Other (See Comments)   Welts, also Has patient had a PCN reaction causing immediate rash, facial/tongue/throat swelling, SOB or lightheadedness with hypotension: Yes Has patient had a PCN reaction causing severe rash involving mucus membranes or skin necrosis: No Has patient had a PCN reaction that required hospitalization No Has patient had a PCN reaction occurring within the last 10 years: No If all of the above answers are "NO", then may proceed with Cephalosporin use.   Penicillin G Hives, Other (See Comments)  Welts on the body, also        Medication List     STOP taking these medications    acetaminophen 500 MG tablet Commonly known as: TYLENOL       TAKE these medications    aspirin 81 MG chewable tablet Commonly known as: Aspirin Childrens Chew 1 tablet (81 mg total) by mouth 2 (two) times daily with a meal.   atorvastatin  20 MG tablet Commonly known as: LIPITOR Take 1 tablet by mouth daily.   busPIRone 5 MG tablet Commonly known as: BUSPAR Take 5-10 mg by mouth See admin instructions. Take 1 tablet by mouth in the morning and 2 tablets at bedtime   clopidogrel 75 MG tablet Commonly known as: PLAVIX Take 1 tablet by mouth daily.   hydrochlorothiazide 12.5 MG capsule Commonly known as: MICROZIDE TAKE ONE CAPSULE BY MOUTH ONCE DAILY   HYDROcodone-acetaminophen 5-325 MG tablet Commonly known as: NORCO/VICODIN Take 1 tablet by mouth every 4 (four) hours as needed for up to 7 days for moderate pain (pain score 4-6) or severe pain (pain score 7-10).   montelukast 10 MG tablet Commonly known as: SINGULAIR Take 10 mg by mouth at bedtime.   nitrofurantoin 50 MG capsule Commonly known as: MACRODANTIN Take 50 mg by mouth at bedtime.   OSTEO BI-FLEX ADV DOUBLE ST PO Take 1 tablet by mouth daily.   potassium chloride 10 MEQ CR capsule Commonly known as: MICRO-K Take 10 mEq by mouth daily.   PriLOSEC OTC 20 MG tablet Generic drug: omeprazole Take 20 mg by mouth daily before breakfast.   ramipril 10 MG capsule Commonly known as: ALTACE Take 10 mg by mouth daily.   Simply Saline 0.9 % Aers Generic drug: Saline Place 1 spray into both nostrils as needed (for congestion).   Systane Hydration PF 0.4-0.3 % Soln Generic drug: Polyethyl Glyc-Propyl Glyc PF Place 1 drop into both eyes 3 (three) times daily as needed (for irritation).   Vitamin D3 25 MCG (1000 UT) Caps Take 1,000 Units by mouth in the morning.               Durable Medical Equipment  (From admission, onward)           Start     Ordered   01/29/23 1405  For home use only DME Hospital bed  Once       Question Answer Comment  Length of Need Lifetime   The above medical condition requires: Patient requires the ability to reposition frequently   Bed type Semi-electric      01/29/23 1405            Follow-up  Information     Clois Dupes, PA-C. Schedule an appointment as soon as possible for a visit in 2 week(s).   Specialty: Orthopedic Surgery Why: For suture removal, For wound re-check Contact information: [address] [place] [address] [phone]                Discharge Exam: Ceasar Mons Weights   01/25/23 1956 01/27/23 0729  Weight: 47.6 kg 47.6 kg   General-appears in no acute distress Heart-S1-S2, regular, no murmur auscultated Lungs-clear to auscultation bilaterally, no wheezing or crackles auscultated Abdomen-soft, nontender, no organomegaly Extremities-no edema in the lower extremities Neuro-alert, oriented x3, no focal deficit noted  Condition at discharge: good  The results of significant diagnostics from this hospitalization (including imaging, microbiology, ancillary and laboratory) are listed below for reference.   Imaging  Studies: DG HIP UNILAT WITH PELVIS 1V RIGHT  Result Date: 01/27/2023 CLINICAL DATA:  Right hip hemiarthroplasty. EXAM: DG HIP (WITH OR WITHOUT PELVIS) 1V RIGHT; DG C-ARM 1-60 MIN-NO REPORT Radiation exposure index: 0.54 mGy. COMPARISON:  January 25, 2023. FINDINGS: Six intraoperative fluoroscopic images were obtained during right hip surgery. Prosthesis appears to be well situated. IMPRESSION: Fluoroscopic guidance provided during right hip hemiarthroplasty. Electronically Signed   By: Lupita Raider M.D.   On: 01/27/2023 10:50   DG Pelvis Portable  Result Date: 01/27/2023 CLINICAL DATA:  Closed displaced fracture of right femoral neck, postoperative status. EXAM: PORTABLE PELVIS 1-2 VIEWS COMPARISON:  January 25, 2023. FINDINGS: Right hip arthroplasty appears to be well situated. Expected postoperative changes are seen in the surrounding soft tissues. IMPRESSION: Status post right hip arthroplasty. Electronically Signed   By: Lupita Raider M.D.   On: 01/27/2023 10:48   DG C-Arm 1-60 Min-No Report  Result Date:  01/27/2023 Fluoroscopy was utilized by the requesting physician.  No radiographic interpretation.   DG C-Arm 1-60 Min-No Report  Result Date: 01/27/2023 Fluoroscopy was utilized by the requesting physician.  No radiographic interpretation.   DG Knee Right Port  Result Date: 01/26/2023 CLINICAL DATA:  Hip fracture EXAM: PORTABLE RIGHT KNEE - 1-2 VIEW COMPARISON:  None Available. FINDINGS: No definite fracture or dislocation of the right knee. High riding position of the patella with fluid density in the expected vicinity of the patellar tendon insertion seen on lateral view. Mild tricompartmental arthrosis. Vascular calcinosis. IMPRESSION: 1. No definite fracture or dislocation of the right knee. 2. High riding position of the patella with fluid density in the expected vicinity of the patellar tendon insertion seen on lateral view, concerning for patellar tendon injury. Consider additional radiographic views including flexion or MRI to further assess if and when clinically appropriate. 3. Mild tricompartmental arthrosis. Electronically Signed   By: Jearld Lesch M.D.   On: 01/26/2023 15:14   DG Femur Portable Min 2 Views Left  Result Date: 01/25/2023 CLINICAL DATA:  Fall, pain EXAM: LEFT FEMUR PORTABLE 2 VIEWS COMPARISON:  None Available. FINDINGS: AP view only demonstrates no acute bony abnormality. No visible fracture. No subluxation or dislocation. IMPRESSION: No acute bony abnormality. Electronically Signed   By: Charlett Nose M.D.   On: 01/25/2023 22:12   DG Femur Min 2 Views Right  Result Date: 01/25/2023 CLINICAL DATA:  Fall, right leg pain EXAM: RIGHT FEMUR 2 VIEWS COMPARISON:  Right hip series today FINDINGS: Right femoral neck fracture seen on right hip series. No additional femoral abnormality. IMPRESSION: Right femoral neck fracture arm hip series. No additional femoral abnormality. Electronically Signed   By: Charlett Nose M.D.   On: 01/25/2023 22:11   DG Pelvis Portable  Result Date:  01/25/2023 CLINICAL DATA:  Fall, right hip pain EXAM: PORTABLE PELVIS 1-2 VIEWS COMPARISON:  None available FINDINGS: There is a right femoral neck fracture with varus angulation. No subluxation or dislocation. Diffuse osteopenia. IMPRESSION: Right femoral neck fracture with varus angulation. Electronically Signed   By: Charlett Nose M.D.   On: 01/25/2023 22:10   DG Chest Portable 1 View  Result Date: 01/25/2023 CLINICAL DATA:  Fall EXAM: PORTABLE CHEST 1 VIEW COMPARISON:  None available FINDINGS: Heart and mediastinal contours within normal limits. Aortic atherosclerosis. No confluent opacities or effusions. No acute bony abnormality. IMPRESSION: No acute cardiopulmonary disease Electronically Signed   By: Charlett Nose M.D.   On: 01/25/2023 22:10    Microbiology: Results for  orders placed or performed during the hospital encounter of 01/25/23  Surgical pcr screen     Status: None   Collection Time: 01/26/23  1:22 PM   Specimen: Nasal Mucosa; Nasal Swab  Result Value Ref Range Status   MRSA, PCR NEGATIVE NEGATIVE Final   Staphylococcus aureus NEGATIVE NEGATIVE Final    Comment: (NOTE) The Xpert SA Assay (FDA approved for NASAL specimens in patients 42 years of age and older), is one component of a comprehensive surveillance program. It is not intended to diagnose infection nor to guide or monitor treatment. Performed at [facility], [address] [place], [place] [address]     Labs: CBC: Recent Labs  Lab 01/25/23 2114 01/26/23 0617 01/28/23 0554 01/29/23 0916  WBC 11.9* 10.3 13.8* 9.9  NEUTROABS 9.5*  --   --   --   HGB 11.7* 12.1 10.3* 9.8*  HCT 35.5* 34.2* 30.7* 29.6*  MCV 90.3 87.9 89.8 90.5  PLT 238 248 209 214   Basic Metabolic Panel: Recent Labs  Lab 01/25/23 2114 01/26/23 0617 01/28/23 0554 01/29/23 0641  NA 132* 134* 133* 134*  K 4.1 4.4 3.9 4.1  CL 99 100 102 104  CO2 23 21* 24 20*  GLUCOSE 162* 144* 118* 122*  BUN 17 15 26* 22  CREATININE 1.16*  1.08* 1.24* 1.17*  CALCIUM 9.2 8.7* 8.6* 8.4*   Liver Function Tests: Recent Labs  Lab 01/26/23 0617  AST 21  ALT 20  ALKPHOS 86  BILITOT 0.8  PROT 6.7  ALBUMIN 3.2*   CBG: Recent Labs  Lab 01/27/23 0903 01/27/23 1021  GLUCAP 169* 136*    Discharge time spent: greater than 30 minutes.  Signed: Meredeth Ide, MD [facility] 01/29/2023

## 2023-01-29 NOTE — Progress Notes (Signed)
Physical Therapy Treatment Patient Details Name: Brenda Lynch MRN: [id number] DOB: May 10, 1923 Today's Date: 01/29/2023   History of Present Illness 89 yo female s/p fall R hip hemiarthroplasy  PMH CHF HTN TIA Dm2 HLD Demenia CAD CKDIII meningioma,    PT Comments  Pt with improved tolerance for mobility this session with encouragement from family. Pt continues to demonstrate a strong posterior lean, often attempting to return to supine due to R hip pain. Pt is able to transfer into standing with significant assistance from 2 individuals due to a strong posterior lean. PT provides demonstration on technique for stand/squat pivot transfer if necessary prior to home health PT sessions. Pt will benefit from continued acute PT services in an effort to reduce falls risk and caregiver burden.     If plan is discharge home, recommend the following: A lot of help with bathing/dressing/bathroom;Assistance with cooking/housework;Assistance with feeding;Assist for transportation;Supervision due to cognitive status;Two people to help with walking and/or transfers   Can travel by private vehicle        Equipment Recommendations  Hospital bed;Other (comment)    Recommendations for Other Services       Precautions / Restrictions Precautions Precautions: Fall Restrictions Weight Bearing Restrictions: Yes RLE Weight Bearing: Weight bearing as tolerated     Mobility  Bed Mobility Overal bed mobility: Needs Assistance Bed Mobility: Supine to Sit, Sit to Supine     Supine to sit: Max assist, +2 for safety/equipment, HOB elevated Sit to supine: Total assist, +2 for physical assistance        Transfers Overall transfer level: Needs assistance Equipment used: Rolling walker (2 wheels), 2 person hand held assist Transfers: Sit to/from Stand Sit to Stand: Max assist, +2 physical assistance           General transfer comment: strong posterior lean in sitting as well as standing     Ambulation/Gait                   Stairs             Wheelchair Mobility     Tilt Bed    Modified Rankin (Stroke Patients Only)       Balance Overall balance assessment: Needs assistance Sitting-balance support: Single extremity supported, Bilateral upper extremity supported, Feet supported Sitting balance-Leahy Scale: Zero Sitting balance - Comments: pt with stong posterior lean, often attempting to return to supine during session Postural control: Posterior lean Standing balance support: Bilateral upper extremity supported Standing balance-Leahy Scale: Zero Standing balance comment: strong posterior lean, 2 person assist to maintain upright                            Cognition Arousal: Alert Behavior During Therapy: Impulsive Overall Cognitive Status: History of cognitive impairments - at baseline                                 General Comments: pt with baseline dementia, follows commands with increased time. Participates better with assistance of her son        Exercises      General Comments General comments (skin integrity, edema, etc.): VSS on RA      Pertinent Vitals/Pain Pain Assessment Pain Assessment: PAINAD Breathing: normal Negative Vocalization: occasional moan/groan, low speech, negative/disapproving quality Facial Expression: sad, frightened, frown Body Language: tense, distressed pacing, fidgeting Consolability: distracted or reassured by voice/touch  PAINAD Score: 4 Pain Intervention(s): Limited activity within patient's tolerance    Home Living                          Prior Function            PT Goals (current goals can now be found in the care plan section) Acute Rehab PT Goals Patient Stated Goal: Unable to state Progress towards PT goals: Progressing toward goals    Frequency    Min 1X/week      PT Plan      Co-evaluation              AM-PAC PT "6 Clicks"  Mobility   Outcome Measure  Help needed turning from your back to your side while in a flat bed without using bedrails?: A Lot Help needed moving from lying on your back to sitting on the side of a flat bed without using bedrails?: Total Help needed moving to and from a bed to a chair (including a wheelchair)?: Total Help needed standing up from a chair using your arms (e.g., wheelchair or bedside chair)?: Total Help needed to walk in hospital room?: Total Help needed climbing 3-5 steps with a railing? : Total 6 Click Score: 7    End of Session Equipment Utilized During Treatment: Gait belt Activity Tolerance: Patient limited by pain Patient left: in bed;with call bell/phone within reach;with family/visitor present Nurse Communication: Mobility status;Need for lift equipment PT Visit Diagnosis: Other abnormalities of gait and mobility (R26.89);History of falling (Z91.81);Difficulty in walking, not elsewhere classified (R26.2);Pain Pain - Right/Left: Right Pain - part of body: Hip     Time: 1559-1630 PT Time Calculation (min) (ACUTE ONLY): 31 min  Charges:    $Therapeutic Activity: 23-37 mins PT General Charges $$ ACUTE PT VISIT: 1 Visit                     Arlyss Gandy, PT, DPT Acute Rehabilitation Office [phone]    Arlyss Gandy 01/29/2023, 5:18 PM

## 2023-01-30 ENCOUNTER — Encounter: Payer: Self-pay | Admitting: Podiatry

## 2023-01-30 DIAGNOSIS — N183 Chronic kidney disease, stage 3 unspecified: Secondary | ICD-10-CM | POA: Diagnosis not present

## 2023-01-31 DIAGNOSIS — F0390 Unspecified dementia without behavioral disturbance: Secondary | ICD-10-CM | POA: Diagnosis not present

## 2023-01-31 DIAGNOSIS — I5032 Chronic diastolic (congestive) heart failure: Secondary | ICD-10-CM | POA: Diagnosis not present

## 2023-01-31 DIAGNOSIS — S72041D Displaced fracture of base of neck of right femur, subsequent encounter for closed fracture with routine healing: Secondary | ICD-10-CM | POA: Diagnosis not present

## 2023-01-31 DIAGNOSIS — E7800 Pure hypercholesterolemia, unspecified: Secondary | ICD-10-CM | POA: Diagnosis not present

## 2023-01-31 DIAGNOSIS — N1832 Chronic kidney disease, stage 3b: Secondary | ICD-10-CM | POA: Diagnosis not present

## 2023-01-31 DIAGNOSIS — E1122 Type 2 diabetes mellitus with diabetic chronic kidney disease: Secondary | ICD-10-CM | POA: Diagnosis not present

## 2023-01-31 DIAGNOSIS — W19XXXD Unspecified fall, subsequent encounter: Secondary | ICD-10-CM | POA: Diagnosis not present

## 2023-01-31 DIAGNOSIS — I130 Hypertensive heart and chronic kidney disease with heart failure and stage 1 through stage 4 chronic kidney disease, or unspecified chronic kidney disease: Secondary | ICD-10-CM | POA: Diagnosis not present

## 2023-01-31 DIAGNOSIS — I2510 Atherosclerotic heart disease of native coronary artery without angina pectoris: Secondary | ICD-10-CM | POA: Diagnosis not present

## 2023-02-02 ENCOUNTER — Ambulatory Visit: Payer: Medicare PPO | Admitting: Podiatry

## 2023-02-06 DIAGNOSIS — F0390 Unspecified dementia without behavioral disturbance: Secondary | ICD-10-CM | POA: Diagnosis not present

## 2023-02-06 DIAGNOSIS — J029 Acute pharyngitis, unspecified: Secondary | ICD-10-CM | POA: Diagnosis not present

## 2023-02-06 DIAGNOSIS — E7800 Pure hypercholesterolemia, unspecified: Secondary | ICD-10-CM | POA: Diagnosis not present

## 2023-02-06 DIAGNOSIS — I2510 Atherosclerotic heart disease of native coronary artery without angina pectoris: Secondary | ICD-10-CM | POA: Diagnosis not present

## 2023-02-06 DIAGNOSIS — E119 Type 2 diabetes mellitus without complications: Secondary | ICD-10-CM | POA: Diagnosis not present

## 2023-02-06 DIAGNOSIS — N1832 Chronic kidney disease, stage 3b: Secondary | ICD-10-CM | POA: Diagnosis not present

## 2023-02-06 DIAGNOSIS — W19XXXD Unspecified fall, subsequent encounter: Secondary | ICD-10-CM | POA: Diagnosis not present

## 2023-02-06 DIAGNOSIS — I5032 Chronic diastolic (congestive) heart failure: Secondary | ICD-10-CM | POA: Diagnosis not present

## 2023-02-06 DIAGNOSIS — E1122 Type 2 diabetes mellitus with diabetic chronic kidney disease: Secondary | ICD-10-CM | POA: Diagnosis not present

## 2023-02-06 DIAGNOSIS — I130 Hypertensive heart and chronic kidney disease with heart failure and stage 1 through stage 4 chronic kidney disease, or unspecified chronic kidney disease: Secondary | ICD-10-CM | POA: Diagnosis not present

## 2023-02-06 DIAGNOSIS — S72041D Displaced fracture of base of neck of right femur, subsequent encounter for closed fracture with routine healing: Secondary | ICD-10-CM | POA: Diagnosis not present

## 2023-02-06 DIAGNOSIS — L89609 Pressure ulcer of unspecified heel, unspecified stage: Secondary | ICD-10-CM | POA: Diagnosis not present

## 2023-02-06 DIAGNOSIS — Z8781 Personal history of (healed) traumatic fracture: Secondary | ICD-10-CM | POA: Diagnosis not present

## 2023-02-07 DIAGNOSIS — I2510 Atherosclerotic heart disease of native coronary artery without angina pectoris: Secondary | ICD-10-CM | POA: Diagnosis not present

## 2023-02-07 DIAGNOSIS — E1122 Type 2 diabetes mellitus with diabetic chronic kidney disease: Secondary | ICD-10-CM | POA: Diagnosis not present

## 2023-02-07 DIAGNOSIS — F0390 Unspecified dementia without behavioral disturbance: Secondary | ICD-10-CM | POA: Diagnosis not present

## 2023-02-07 DIAGNOSIS — S72041D Displaced fracture of base of neck of right femur, subsequent encounter for closed fracture with routine healing: Secondary | ICD-10-CM | POA: Diagnosis not present

## 2023-02-07 DIAGNOSIS — I5032 Chronic diastolic (congestive) heart failure: Secondary | ICD-10-CM | POA: Diagnosis not present

## 2023-02-07 DIAGNOSIS — E7800 Pure hypercholesterolemia, unspecified: Secondary | ICD-10-CM | POA: Diagnosis not present

## 2023-02-07 DIAGNOSIS — I130 Hypertensive heart and chronic kidney disease with heart failure and stage 1 through stage 4 chronic kidney disease, or unspecified chronic kidney disease: Secondary | ICD-10-CM | POA: Diagnosis not present

## 2023-02-07 DIAGNOSIS — W19XXXD Unspecified fall, subsequent encounter: Secondary | ICD-10-CM | POA: Diagnosis not present

## 2023-02-07 DIAGNOSIS — N1832 Chronic kidney disease, stage 3b: Secondary | ICD-10-CM | POA: Diagnosis not present

## 2023-02-08 ENCOUNTER — Ambulatory Visit: Payer: Medicare PPO | Admitting: Nurse Practitioner

## 2023-02-08 DIAGNOSIS — I5032 Chronic diastolic (congestive) heart failure: Secondary | ICD-10-CM | POA: Diagnosis not present

## 2023-02-08 DIAGNOSIS — W19XXXD Unspecified fall, subsequent encounter: Secondary | ICD-10-CM | POA: Diagnosis not present

## 2023-02-08 DIAGNOSIS — S72041D Displaced fracture of base of neck of right femur, subsequent encounter for closed fracture with routine healing: Secondary | ICD-10-CM | POA: Diagnosis not present

## 2023-02-08 DIAGNOSIS — F0390 Unspecified dementia without behavioral disturbance: Secondary | ICD-10-CM | POA: Diagnosis not present

## 2023-02-08 DIAGNOSIS — I2510 Atherosclerotic heart disease of native coronary artery without angina pectoris: Secondary | ICD-10-CM | POA: Diagnosis not present

## 2023-02-08 DIAGNOSIS — E7800 Pure hypercholesterolemia, unspecified: Secondary | ICD-10-CM | POA: Diagnosis not present

## 2023-02-08 DIAGNOSIS — N1832 Chronic kidney disease, stage 3b: Secondary | ICD-10-CM | POA: Diagnosis not present

## 2023-02-08 DIAGNOSIS — E1122 Type 2 diabetes mellitus with diabetic chronic kidney disease: Secondary | ICD-10-CM | POA: Diagnosis not present

## 2023-02-08 DIAGNOSIS — I130 Hypertensive heart and chronic kidney disease with heart failure and stage 1 through stage 4 chronic kidney disease, or unspecified chronic kidney disease: Secondary | ICD-10-CM | POA: Diagnosis not present

## 2023-02-13 ENCOUNTER — Emergency Department (HOSPITAL_COMMUNITY): Payer: Medicare PPO

## 2023-02-13 ENCOUNTER — Emergency Department (HOSPITAL_COMMUNITY)
Admission: EM | Admit: 2023-02-13 | Discharge: 2023-02-13 | Disposition: A | Discharge status: Home / Self Care | Payer: Medicare PPO | Attending: Emergency Medicine | Admitting: Emergency Medicine

## 2023-02-13 DIAGNOSIS — N189 Chronic kidney disease, unspecified: Secondary | ICD-10-CM | POA: Diagnosis not present

## 2023-02-13 DIAGNOSIS — I2510 Atherosclerotic heart disease of native coronary artery without angina pectoris: Secondary | ICD-10-CM | POA: Insufficient documentation

## 2023-02-13 DIAGNOSIS — R55 Syncope and collapse: Secondary | ICD-10-CM | POA: Diagnosis not present

## 2023-02-13 DIAGNOSIS — Z7401 Bed confinement status: Secondary | ICD-10-CM | POA: Diagnosis not present

## 2023-02-13 DIAGNOSIS — E860 Dehydration: Secondary | ICD-10-CM | POA: Insufficient documentation

## 2023-02-13 DIAGNOSIS — I6782 Cerebral ischemia: Secondary | ICD-10-CM | POA: Diagnosis not present

## 2023-02-13 DIAGNOSIS — R5381 Other malaise: Secondary | ICD-10-CM | POA: Diagnosis not present

## 2023-02-13 DIAGNOSIS — G459 Transient cerebral ischemic attack, unspecified: Secondary | ICD-10-CM | POA: Diagnosis not present

## 2023-02-13 DIAGNOSIS — R29810 Facial weakness: Secondary | ICD-10-CM | POA: Diagnosis not present

## 2023-02-13 DIAGNOSIS — R627 Adult failure to thrive: Secondary | ICD-10-CM | POA: Diagnosis not present

## 2023-02-13 DIAGNOSIS — R4182 Altered mental status, unspecified: Secondary | ICD-10-CM | POA: Diagnosis not present

## 2023-02-13 DIAGNOSIS — I959 Hypotension, unspecified: Secondary | ICD-10-CM | POA: Diagnosis not present

## 2023-02-13 DIAGNOSIS — I129 Hypertensive chronic kidney disease with stage 1 through stage 4 chronic kidney disease, or unspecified chronic kidney disease: Secondary | ICD-10-CM | POA: Insufficient documentation

## 2023-02-13 DIAGNOSIS — R531 Weakness: Secondary | ICD-10-CM | POA: Diagnosis not present

## 2023-02-13 DIAGNOSIS — R404 Transient alteration of awareness: Secondary | ICD-10-CM | POA: Diagnosis not present

## 2023-02-13 DIAGNOSIS — S72031D Displaced midcervical fracture of right femur, subsequent encounter for closed fracture with routine healing: Secondary | ICD-10-CM | POA: Diagnosis not present

## 2023-02-13 DIAGNOSIS — I700 Atherosclerosis of aorta: Secondary | ICD-10-CM | POA: Diagnosis not present

## 2023-02-13 DIAGNOSIS — D320 Benign neoplasm of cerebral meninges: Secondary | ICD-10-CM | POA: Diagnosis not present

## 2023-02-13 MED ORDER — ONDANSETRON HCL 4 MG/2ML IJ SOLN
4.0000 mg | Freq: Once | INTRAMUSCULAR | Status: AC
  Administered 2023-02-13: 4 mg via INTRAVENOUS
  Filled 2023-02-13: qty 2

## 2023-02-13 MED ORDER — LACTATED RINGERS IV BOLUS
1000.0000 mL | Freq: Once | INTRAVENOUS | Status: AC
  Administered 2023-02-13: 1000 mL via INTRAVENOUS

## 2023-02-13 NOTE — Care Management (Signed)
ED RNCM spoke with patient's son Tacie Kornegay [phone] concerning transitonal planning. He reports that patient will be starting services tomorrow morning with [facility] services. He states patient will be returning home tonight to start hospice services in the am. He states he is unable to transport her home tonight in his vehicle, explained that patient can be transported by [facility]. Informed him that I will notify [facility] tonight that patient will be returning home. He is agreeable, denies any other ED CM needs . Updated EDP on final plan.

## 2023-02-13 NOTE — Code Documentation (Signed)
Stroke Response Nurse Documentation Code Documentation  Brenda Lynch is a 89 y.o. female arriving to [facility]  via [facility] on 02/13/2023 with past medical hx of HTN, HLD, DM, neuropathy, dementia, CKD, CHF, recent R hip surgery 01/27/23. On No antithrombotic. Code stroke was activated by EMS.   Patient  LKW at 1600 and now complaining of left sided weakness. Patient had an assisted fall on 01/25/2023 and fractured her R hip. 01/27/2023 she had hip surgery and has had a poor appetite since discharge, not eating or drinking. Today family noticed she had a left facial droop and was slumping to the left side  Stroke team at the bedside on patient arrival. Labs drawn and patient cleared for CT by Dr. Lockie Mola. Patient to CT with team. NIHSS 23, see documentation for details and code stroke times. Patient with decreased LOC, disoriented, not following commands, bilateral arm weakness, bilateral leg weakness, left decreased sensation, Global aphasia , and dysarthria  on exam. The following imaging was completed:  CT Head. Patient is not a candidate for IV Thrombolytic due to recent hip surgery 01/27/2023. Patient is not a candidate for IR due to risk not felt outweighed benefit due to baseline MRS per MD.   Care Plan: VS/NIHSS q2hrs, BP Goal <220/120 until MD has goals of care discussion with family.   Bedside handoff with ED RN Darral Dash.    Felecia Jan  Stroke Response RN

## 2023-02-13 NOTE — ED Provider Notes (Signed)
[place] EMERGENCY DEPARTMENT AT [facility] Provider Note   CSN: [id number] Arrival date & time: 02/13/23  1858  An emergency department physician performed an initial assessment on this suspected stroke patient at 1901.  History  Chief Complaint  Patient presents with   Altered Mental Status   Code Stroke    Brenda Lynch is a 89 y.o. female.  Level 5 caveat.  History provided by EMS.  Patient comes as a code stroke.  Sounds like patient had altered mental status or slumped over in the chair this evening with family with left-sided facial droop.  She has had a recent right hip surgery.  On aspirin and Plavix but sounds like she has not ate or drank or taken her medications since being discharged from the hospital.  Sounds like she is coming from home.  EMS mentions that they are looking to get hospice involved.  Per chart patient has history of CAD hypertension diabetes heart failure CKD, hx of dementia.  Patient has vomited and route.  The history is provided by the EMS personnel.       Home Medications Prior to Admission medications   Medication Sig Start Date End Date Taking? Authorizing Provider  aspirin (ASPIRIN CHILDRENS) 81 MG chewable tablet Chew 1 tablet (81 mg total) by mouth 2 (two) times daily with a meal. 01/29/23 03/15/23  Hill, Alain Honey, PA-C  atorvastatin (LIPITOR) 20 MG tablet Take 1 tablet by mouth daily.    [provider]  busPIRone (BUSPAR) 5 MG tablet Take 5-10 mg by mouth See admin instructions. Take 1 tablet by mouth in the morning and 2 tablets at bedtime 08/09/22   [provider]  Cholecalciferol (VITAMIN D3) 25 MCG (1000 UT) CAPS Take 1,000 Units by mouth in the morning.    [provider]  clopidogrel (PLAVIX) 75 MG tablet Take 1 tablet by mouth daily.    [provider]  hydrochlorothiazide (MICROZIDE) 12.5 MG capsule TAKE ONE CAPSULE BY MOUTH ONCE DAILY 04/17/22   Turner, Cornelious Bryant, MD  Misc Natural  Products (OSTEO BI-FLEX ADV DOUBLE ST PO) Take 1 tablet by mouth daily.    [provider]  montelukast (SINGULAIR) 10 MG tablet Take 10 mg by mouth at bedtime. 12/12/12   [provider]  nitrofurantoin (MACRODANTIN) 50 MG capsule Take 50 mg by mouth at bedtime.    [provider]  Polyethyl Glyc-Propyl Glyc PF (SYSTANE HYDRATION PF) 0.4-0.3 % SOLN Place 1 drop into both eyes 3 (three) times daily as needed (for irritation).    [provider]  potassium chloride (MICRO-K) 10 MEQ CR capsule Take 10 mEq by mouth daily.    [provider]  PRILOSEC OTC 20 MG tablet Take 20 mg by mouth daily before breakfast.    [provider]  ramipril (ALTACE) 10 MG capsule Take 10 mg by mouth daily.    [provider]  Saline (SIMPLY SALINE) 0.9 % AERS Place 1 spray into both nostrils as needed (for congestion).    [provider]      Allergies    Ciprofloxacin, Doxycycline hyclate, Penicillins, and Penicillin g    Review of Systems   Review of Systems  Physical Exam Updated Vital Signs BP (!) 116/52   Pulse 81   Resp (!) 22   Ht 5\' 5"  (1.651 m)   Wt 47.4 kg   SpO2 99%   BMI 17.39 kg/m  Physical Exam Vitals and nursing note reviewed.  Constitutional:      General: She is not in acute distress.    Appearance: She is well-developed.     Comments: Patient response to pain, moves extremities, patient is cachectic very thin and frail and chronically ill-appearing  HENT:     Head: Normocephalic and atraumatic.  Eyes:     Conjunctiva/sclera: Conjunctivae normal.  Cardiovascular:     Rate and Rhythm: Normal rate and regular rhythm.     Heart sounds: No murmur heard. Pulmonary:     Effort: Pulmonary effort is normal. No respiratory distress.     Breath sounds: Normal breath sounds.  Abdominal:     Palpations: Abdomen is soft.     Tenderness: There is no abdominal tenderness.  Musculoskeletal:        General: No swelling.      Cervical back: Neck supple.  Skin:    General: Skin is warm and dry.     Capillary Refill: Capillary refill takes less than 2 seconds.  Neurological:     Comments: Moves all extremity spontaneously, opens eyes to pain  Psychiatric:        Mood and Affect: Mood normal.     ED Results / Procedures / Treatments   Labs (all labs ordered are listed, but only abnormal results are displayed) Labs Reviewed - No data to display   EKG None  Radiology DG Chest Portable 1 View  Result Date: 02/13/2023 CLINICAL DATA:  Altered mental status. EXAM: PORTABLE CHEST 1 VIEW COMPARISON:  Chest radiograph dated 01/25/2023. FINDINGS: No focal consolidation, pleural effusion, or pneumothorax. Stable cardiac silhouette. Atherosclerotic calcification of the aorta. Osteopenia with degenerative changes of the spine. No acute osseous pathology. IMPRESSION: No active disease. Electronically Signed   By: Elgie Collard M.D.   On: 02/13/2023 20:43   CT HEAD CODE STROKE WO CONTRAST  Result Date: 02/13/2023 CLINICAL DATA:  Code stroke. Neuro deficit, acute, stroke suspected. Left-sided weakness and facial droop. EXAM: CT HEAD WITHOUT CONTRAST TECHNIQUE: Contiguous axial images were obtained from the base of the skull through the vertex without intravenous contrast. RADIATION DOSE REDUCTION: This exam was performed according to the departmental dose-optimization program which includes automated exposure control, adjustment of the mA and/or kV according to patient size and/or use of iterative reconstruction technique. COMPARISON:  Head CT 02/18/2022 and MRI 07/31/2020 FINDINGS: Brain: There is no evidence of an acute infarct, intracranial hemorrhage, midline shift, or extra-axial fluid collection. A 1.5 cm calcified mass along the anterior falx is unchanged from the prior CT. There is moderate cerebral atrophy. Patchy to confluent hypodensities in the cerebral white matter are unchanged from the prior CT and are  nonspecific but compatible with moderate chronic small vessel ischemic disease. Vascular: Calcified atherosclerosis at the skull base. No hyperdense vessel. Skull: No acute fracture or suspicious osseous lesion. Sinuses/Orbits: Moderate mucosal thickening in the left sphenoid sinus. Clear mastoid air cells. Bilateral cataract extraction. Other: None. ASPECTS (Alberta Stroke Program Early CT Score) - Ganglionic level infarction (caudate, lentiform nuclei, internal capsule, insula, M1-M3 cortex): 7 - Supraganglionic infarction (M4-M6 cortex): 3 Total score (0-10 with 10 being normal): 10 These results were communicated to Dr. Iver Nestle at 7:16 pm on 02/13/2023 by text page via the [facility] messaging system. IMPRESSION: 1. No evidence of acute intracranial abnormality. ASPECTS of 10. 2. Moderate chronic small vessel ischemic disease. 3. Unchanged 1.5 cm calcified meningioma along the falx. Electronically Signed   By: Sebastian Ache M.D.   On: 02/13/2023 19:16    Procedures  Procedures    Medications Ordered in ED Medications  lactated ringers bolus 1,000 mL (has no administration in time range)  ondansetron (ZOFRAN) injection 4 mg (4 mg Intravenous Given 02/13/23 1944)  lactated ringers bolus 1,000 mL (1,000 mLs Intravenous New Bag/Given 02/13/23 1944)    ED Course/ Medical Decision Making/ A&P                                 Medical Decision Making Amount and/or Complexity of Data Reviewed Labs: ordered. Radiology: ordered.  Risk Prescription drug management.   [facility] patient arrives as a code stroke.  History of dementia.  History of CAD hypertension high cholesterol, CKD.  Recent right hip surgery.  Is now home with 24/7 care with family.  She has not done well since breaking her right hip and being home here the last few weeks.  She has not been eating or drinking anything and now decreased responsiveness.  Neurology was at the bedside upon arrival.  Head CT was unremarkable.  Her exam was  overall limited due to her dementia but she seemed to be moving all extremities.  Opening eyes spontaneously.  I had extensive conversation with the son at the bedside.  Patient is a DNR with a MOST form that says comfort measures.  We had prolonged discussion about this.  [facility] actually [facility] coming [facility] the house tomorrow and we had discussed her going full comfort measures.  We will not pursue any further workup.  She already had a chest x-ray done that showed no evidence of pneumonia but head CT was also unremarkable.  Overall we gave her some IV fluids while she was here patient was transported back home.  Overall patient to now follow with hospice and be comfort care only.  Patient discharged.  This chart was dictated using voice recognition software.  Despite best efforts to proofread,  errors can occur which can change the documentation meaning.         Final Clinical Impression(s) / ED Diagnoses Final diagnoses:  Dehydration  Failure to thrive in adult    Rx / DC Orders ED Discharge Orders     None         Virgina Norfolk, DO 02/13/23 2104

## 2023-02-13 NOTE — Consult Note (Addendum)
NEUROLOGY CONSULT NOTE   Date of service: February 13, 2023 Patient Name: Brenda Lynch MRN:  [id number] DOB:  [birth year] Chief Complaint: "Left facial droop and slumping to the left side" Requesting Provider: Virgina Norfolk, DO  History of Present Illness  Brenda Lynch is a 89 y.o. woman with a past medical history significant for hypertension, hyperlipidemia, diabetes complicated by neuropathy, dementia, CKD, congestive heart failure, DNR CODE STATUS, recent right hip surgery 01/27/2023  She was ambulating to the bathroom on 01/25/2023 when she had an assisted fall but landed on her right hip which unfortunately was fractured.  She was discharged on 01/29/2023 but has had very poor appetite since discharge, not eating or drinking well but without any other systemic signs or symptoms noted (no nausea, vomiting, fevers, chills, mild baseline cough attributed to her chronic postnasal drip).  She has not been taking her aspirin and Plavix that she was discharged on. No concern for seizure-like activity  Today she had gone to her orthopedic surgeon for follow-up and had a mask in place.  She was generally slumped in the chair much of the time.  On arrival home when they removed the patient's mask they noted her face seemed to be drooping to the left and she was leaning to the left.  EMS was activated and noted blood pressures of 70s over 40s, poorly responsive patient with concern for left-sided weakness for which code stroke was activated  LKW: 4 PM Modified rankin score: 4-5 IV Thrombolysis: No, poor baseline EVT: No, risk not felt outweigh benefit given baseline MRS   NIHSS components Score: Comment  1a Level of Conscious 0[]  1[x]  2[]  3[]      1b LOC Questions 0[]  1[]  2[x]       1c LOC Commands 0[]  1[]  2[x]       2 Best Gaze 0[x]  1[]  2[]       3 Visual 0[x]  1[]  2[]  3[]      4 Facial Palsy 0[x]  1[]  2[]  3[]    Head slumped to the left, no clear facial droop out of proportion to loose skin and  leaning.  No clear asymmetry on grimace  5a Motor Arm - left 0[]  1[]  2[x]  3[]  4[]  UN[]    5b Motor Arm - Right 0[]  1[]  2[x]  3[]  4[]  UN[]    6a Motor Leg - Left 0[]  1[]  2[]  3[]  4[x]  UN[]    6b Motor Leg - Right 0[]  1[]  2[]  3[]  4[x]  UN[]    7 Limb Ataxia 0[x]  1[]  2[]  3[]  UN[]     8 Sensory 0[]  1[x]  2[]  UN[]    No response to noxious stimulation of bilateral legs  9 Best Language 0[]  1[]  2[]  3[x]      10 Dysarthria 0[]  1[]  2[x]  UN[]      11 Extinct. and Inattention 0[x]  1[]  2[]       TOTAL:  23      ROS  Unable to assess secondary to patient's mental status   Past History   Past Medical History:  Diagnosis Date   Allergic rhinitis    Chronic diastolic CHF (congestive heart failure) (HCC)    CKD (chronic kidney disease), stage II    Coronary artery disease    Diabetes mellitus    Diverticulosis    Drug-induced bradycardia 01/28/2016   Hypercholesterolemia    Hypertension    Hyponatremia    Meningioma (HCC)    Osteoarthritis    Sedentary lifestyle    Spinal stenosis    Vertigo, benign positional     Past Surgical History:  Procedure Laterality  Date   ANTERIOR APPROACH HEMI HIP ARTHROPLASTY Right 01/27/2023   Procedure: ANTERIOR APPROACH HEMI HIP ARTHROPLASTY;  Surgeon: Samson Frederic, MD;  Location: [facility] OR;  Service: Orthopedics;  Laterality: Right;   BACK SURGERY     BREAST BIOPSY     SKIN FULL THICKNESS GRAFT Right 11/08/2017   Procedure: RIGHT SMALL FINGER ABLATION OF NAIL ANDSKIN GRAFT FULL THICKNESS;  Surgeon: Betha Loa, MD;  Location: [facility];  Service: Orthopedics;  Laterality: Right;   TONSILLECTOMY      Family History: Family History  Problem Relation Age of Onset   Heart disease Mother    Heart attack Mother    Prostate cancer Father    Lung cancer Brother    Pancreatic cancer Brother    Headache Neg Hx     Social History  reports that she quit smoking about 44 years ago. Her smoking use included cigarettes. She has never used smokeless  tobacco. She reports that she does not drink alcohol and does not use drugs.  Allergies  Allergen Reactions   Ciprofloxacin Nausea Only and Other (See Comments)    GI intolerance  Other reaction(s): GI intol   Doxycycline Hyclate Other (See Comments)    BS elev, Head pressure    Penicillins Hives and Other (See Comments)    Welts, also Has patient had a PCN reaction causing immediate rash, facial/tongue/throat swelling, SOB or lightheadedness with hypotension: Yes Has patient had a PCN reaction causing severe rash involving mucus membranes or skin necrosis: No Has patient had a PCN reaction that required hospitalization No Has patient had a PCN reaction occurring within the last 10 years: No If all of the above answers are "NO", then may proceed with Cephalosporin use.   Penicillin G Hives and Other (See Comments)    Welts on the body, also     Medications  No current facility-administered medications for this encounter.  Current Outpatient Medications:    aspirin (ASPIRIN CHILDRENS) 81 MG chewable tablet, Chew 1 tablet (81 mg total) by mouth 2 (two) times daily with a meal., Disp: 90 tablet, Rfl: 0   atorvastatin (LIPITOR) 20 MG tablet, Take 1 tablet by mouth daily., Disp: , Rfl:    busPIRone (BUSPAR) 5 MG tablet, Take 5-10 mg by mouth See admin instructions. Take 1 tablet by mouth in the morning and 2 tablets at bedtime, Disp: , Rfl:    Cholecalciferol (VITAMIN D3) 25 MCG (1000 UT) CAPS, Take 1,000 Units by mouth in the morning., Disp: , Rfl:    clopidogrel (PLAVIX) 75 MG tablet, Take 1 tablet by mouth daily., Disp: , Rfl:    hydrochlorothiazide (MICROZIDE) 12.5 MG capsule, TAKE ONE CAPSULE BY MOUTH ONCE DAILY, Disp: 30 capsule, Rfl: 11   Misc Natural Products (OSTEO BI-FLEX ADV DOUBLE ST PO), Take 1 tablet by mouth daily., Disp: , Rfl:    montelukast (SINGULAIR) 10 MG tablet, Take 10 mg by mouth at bedtime., Disp: , Rfl:    nitrofurantoin (MACRODANTIN) 50 MG capsule, Take 50 mg  by mouth at bedtime., Disp: , Rfl:    Polyethyl Glyc-Propyl Glyc PF (SYSTANE HYDRATION PF) 0.4-0.3 % SOLN, Place 1 drop into both eyes 3 (three) times daily as needed (for irritation)., Disp: , Rfl:    potassium chloride (MICRO-K) 10 MEQ CR capsule, Take 10 mEq by mouth daily., Disp: , Rfl:    PRILOSEC OTC 20 MG tablet, Take 20 mg by mouth daily before breakfast., Disp: , Rfl:    ramipril (ALTACE)  10 MG capsule, Take 10 mg by mouth daily., Disp: , Rfl:    Saline (SIMPLY SALINE) 0.9 % AERS, Place 1 spray into both nostrils as needed (for congestion)., Disp: , Rfl:   Vitals   Vitals:   02/13/23 1900  Weight: 47.4 kg  Height: 5\' 5"  (1.651 m)    Body mass index is 17.39 kg/m.  Physical Exam   Constitutional: Appears cachectic Psych: Minimally interactive Eyes: No scleral injection.  HENT: No OP obstruction.  Head: Normocephalic.  Cardiovascular: Normal rate and regular rhythm on monitor  Respiratory: Effort normal, non-labored breathing.  GI: Soft.  No distension. There is no tenderness.   Neurologic Examination   Neuro: Mental Status: Patient mostly keeps her eyes closed.  Not clearly following commands, nonverbal Cranial Nerves: II: Left pupil is slightly more brisk than the right and constricts 1 mm more than the right but both are reactive III,IV, VI, VIII: Gaze mostly midline, VOR intact V: Facial sensation is symmetric to eyelash brush VII: Facial movement is notable for drooping excess skin folds with head tilted to the left, not clearly a true facial droop, no clear asymmetry on grimace VIII: Appears to respond to examiner's voice at times Sensory/motor: Using the right upper extremity spontaneously antigravity.  Withdraws the left upper extremity to noxious stimulation, briefly antigravity.  No movement of the bilateral legs to noxious stimulation  Labs/Imaging/Neurodiagnostic studies    Labs pending at the time of my note, patient was a difficult stick and we were  unable to obtain labs on arrival  Basic Metabolic Panel: No results for input(s): "NA", "K", "CL", "CO2", "GLUCOSE", "BUN", "CREATININE", "CALCIUM", "MG", "PHOS" in the last 168 hours.  CBC: No results for input(s): "WBC", "NEUTROABS", "HGB", "HCT", "MCV", "PLT" in the last 168 hours.  Coagulation Studies: No results for input(s): "LABPROT", "INR" in the last 72 hours.   Lipid Panel:  Lab Results  Component Value Date   LDLCALC 65 08/01/2020   HgbA1c:  Lab Results  Component Value Date   HGBA1C 6.4 (H) 08/01/2020   Urine Drug Screen:     Component Value Date/Time   LABOPIA NONE DETECTED 01/19/2022 2322   COCAINSCRNUR NONE DETECTED 01/19/2022 2322   LABBENZ NONE DETECTED 01/19/2022 2322   AMPHETMU NONE DETECTED 01/19/2022 2322   THCU NONE DETECTED 01/19/2022 2322   LABBARB NONE DETECTED 01/19/2022 2322    Alcohol Level     Component Value Date/Time   ETH <10 01/19/2022 2151   INR  Lab Results  Component Value Date   INR 1.0 01/19/2022   APTT  Lab Results  Component Value Date   APTT 26 01/19/2022   AED levels: No results found for: "PHENYTOIN", "ZONISAMIDE", "LAMOTRIGINE", "LEVETIRACETA"  CT Head without contrast(Personally reviewed): Agree with radiology, no acute intracranial process 1. No evidence of acute intracranial abnormality. ASPECTS of 10. 2. Moderate chronic small vessel ischemic disease. 3. Unchanged 1.5 cm calcified meningioma along the falx.   ASSESSMENT   Patient presenting with slumping to the left and possible left facial droop in the setting of hypotension in the setting of failure to thrive since her hospital discharge.  Recent hip surgery is a contraindication to TNK and given her overall medical condition and baseline functional status she is not a good candidate for thrombectomy.  RECOMMENDATIONS  -Stat head CT obtained to rule out ICH -Medical workup per ED provider -MRI brain should be considered if results would change management,  for example if family would want full stroke  workup if MRI brain was positive -Inpatient neurology will sign off at this time but please do not hesitate to reach out if any questions or concerns arise.  Discussed with Dr. Lockie Mola in person ______________________________________________________________________    Signed, Gordy Councilman, MD [facility]   CRITICAL CARE Performed by: Gordy Councilman   Total critical care time: 35 minutes  Critical care time was exclusive of separately billable procedures and treating other patients.  Critical care was necessary to treat or prevent imminent or life-threatening deterioration; emergent evaluation for consideration of thrombectomy or thrombolytic  Critical care was time spent personally by me on the following activities: development of treatment plan with patient and/or surrogate as well as nursing, discussions with consultants, evaluation of patient's response to treatment, examination of patient, obtaining history from patient or surrogate, ordering and performing treatments and interventions, ordering and review of laboratory studies, ordering and review of radiographic studies, pulse oximetry and re-evaluation of patient's condition.

## 2023-02-13 NOTE — ED Triage Notes (Signed)
Pt BIB [facility] from home, LKW 1600, history of dementia, history of stroke last year, presented today with increase left sided weakness, left facial droop, had right hip replacement 2 weeks ago.    Pt is to go on hospice with family tomorrow. Pt received 500 bolus of NS en route.

## 2023-02-14 DIAGNOSIS — E860 Dehydration: Secondary | ICD-10-CM | POA: Diagnosis not present

## 2023-02-14 DIAGNOSIS — R627 Adult failure to thrive: Secondary | ICD-10-CM | POA: Diagnosis not present

## 2023-02-18 DEATH — deceased
